# Patient Record
Sex: Female | Born: 1945 | Race: White | Hispanic: No | Smoking: Former smoker
Health system: Southern US, Community
[De-identification: ages and names within clinical notes are randomized; demographics above are authoritative.]

## PROBLEM LIST (undated history)

## (undated) DIAGNOSIS — G5600 Carpal tunnel syndrome, unspecified upper limb: Secondary | ICD-10-CM

## (undated) DIAGNOSIS — I10 Essential (primary) hypertension: Secondary | ICD-10-CM

## (undated) DIAGNOSIS — J9610 Chronic respiratory failure, unspecified whether with hypoxia or hypercapnia: Secondary | ICD-10-CM

## (undated) DIAGNOSIS — F411 Generalized anxiety disorder: Secondary | ICD-10-CM

## (undated) DIAGNOSIS — M545 Low back pain: Secondary | ICD-10-CM

## (undated) DIAGNOSIS — J449 Chronic obstructive pulmonary disease, unspecified: Secondary | ICD-10-CM

## (undated) DIAGNOSIS — I255 Ischemic cardiomyopathy: Secondary | ICD-10-CM

## (undated) DIAGNOSIS — M79609 Pain in unspecified limb: Secondary | ICD-10-CM

## (undated) DIAGNOSIS — R079 Chest pain, unspecified: Secondary | ICD-10-CM

## (undated) DIAGNOSIS — D649 Anemia, unspecified: Secondary | ICD-10-CM

## (undated) DIAGNOSIS — E119 Type 2 diabetes mellitus without complications: Secondary | ICD-10-CM

## (undated) DIAGNOSIS — G2581 Restless legs syndrome: Secondary | ICD-10-CM

## (undated) DIAGNOSIS — N259 Disorder resulting from impaired renal tubular function, unspecified: Secondary | ICD-10-CM

## (undated) DIAGNOSIS — R609 Edema, unspecified: Secondary | ICD-10-CM

## (undated) DIAGNOSIS — I4891 Unspecified atrial fibrillation: Secondary | ICD-10-CM

## (undated) DIAGNOSIS — E7800 Pure hypercholesterolemia, unspecified: Secondary | ICD-10-CM

## (undated) DIAGNOSIS — I5021 Acute systolic (congestive) heart failure: Secondary | ICD-10-CM

## (undated) DIAGNOSIS — J189 Pneumonia, unspecified organism: Secondary | ICD-10-CM

## (undated) HISTORY — DX: Chronic obstructive pulmonary disease, unspecified: J44.9

## (undated) HISTORY — DX: Pneumonia, unspecified organism: J18.9

## (undated) HISTORY — DX: Disorder resulting from impaired renal tubular function, unspecified: N25.9

## (undated) HISTORY — DX: Chronic respiratory failure, unspecified whether with hypoxia or hypercapnia: J96.10

## (undated) HISTORY — DX: Morbid (severe) obesity due to excess calories: E66.01

## (undated) HISTORY — DX: Pure hypercholesterolemia, unspecified: E78.00

## (undated) HISTORY — DX: Edema, unspecified: R60.9

## (undated) HISTORY — DX: Type 2 diabetes mellitus without complications: E11.9

## (undated) HISTORY — DX: Anemia, unspecified: D64.9

## (undated) HISTORY — DX: Chest pain, unspecified: R07.9

## (undated) HISTORY — DX: Pain in unspecified limb: M79.609

## (undated) HISTORY — DX: Generalized anxiety disorder: F41.1

## (undated) HISTORY — DX: Restless legs syndrome: G25.81

## (undated) HISTORY — DX: Carpal tunnel syndrome, unspecified upper limb: G56.00

## (undated) HISTORY — DX: Essential (primary) hypertension: I10

## (undated) HISTORY — DX: Low back pain: M54.5

---

## 1985-01-11 HISTORY — PX: ABDOMINAL HYSTERECTOMY: SHX81

## 1997-12-13 ENCOUNTER — Other Ambulatory Visit: Admission: RE | Admit: 1997-12-13 | Discharge: 1997-12-13 | Payer: Self-pay | Admitting: Gynecology

## 1998-05-08 ENCOUNTER — Ambulatory Visit (HOSPITAL_COMMUNITY): Admission: RE | Admit: 1998-05-08 | Discharge: 1998-05-08 | Payer: Self-pay | Admitting: Gynecology

## 1998-12-25 ENCOUNTER — Other Ambulatory Visit: Admission: RE | Admit: 1998-12-25 | Discharge: 1998-12-25 | Payer: Self-pay | Admitting: Gynecology

## 1999-06-06 ENCOUNTER — Inpatient Hospital Stay (HOSPITAL_COMMUNITY): Admission: EM | Admit: 1999-06-06 | Discharge: 1999-06-11 | Payer: Self-pay | Admitting: Emergency Medicine

## 1999-06-07 ENCOUNTER — Encounter: Payer: Self-pay | Admitting: Infectious Diseases

## 1999-06-17 ENCOUNTER — Encounter: Admission: RE | Admit: 1999-06-17 | Discharge: 1999-06-17 | Payer: Self-pay | Admitting: Internal Medicine

## 1999-07-20 ENCOUNTER — Encounter: Admission: RE | Admit: 1999-07-20 | Discharge: 1999-07-20 | Payer: Self-pay | Admitting: Internal Medicine

## 1999-08-05 ENCOUNTER — Encounter: Admission: RE | Admit: 1999-08-05 | Discharge: 1999-11-03 | Payer: Self-pay | Admitting: *Deleted

## 1999-08-17 ENCOUNTER — Encounter: Admission: RE | Admit: 1999-08-17 | Discharge: 1999-08-17 | Payer: Self-pay | Admitting: Internal Medicine

## 1999-08-20 ENCOUNTER — Encounter (HOSPITAL_COMMUNITY): Admission: RE | Admit: 1999-08-20 | Discharge: 1999-11-18 | Payer: Self-pay | Admitting: Internal Medicine

## 1999-08-31 ENCOUNTER — Encounter: Admission: RE | Admit: 1999-08-31 | Discharge: 1999-08-31 | Payer: Self-pay | Admitting: Internal Medicine

## 1999-09-22 ENCOUNTER — Encounter: Admission: RE | Admit: 1999-09-22 | Discharge: 1999-09-22 | Payer: Self-pay | Admitting: Internal Medicine

## 1999-12-14 ENCOUNTER — Encounter: Admission: RE | Admit: 1999-12-14 | Discharge: 1999-12-14 | Payer: Self-pay | Admitting: Internal Medicine

## 2000-04-19 ENCOUNTER — Other Ambulatory Visit: Admission: RE | Admit: 2000-04-19 | Discharge: 2000-04-19 | Payer: Self-pay | Admitting: Obstetrics & Gynecology

## 2001-08-22 ENCOUNTER — Inpatient Hospital Stay (HOSPITAL_COMMUNITY): Admission: EM | Admit: 2001-08-22 | Discharge: 2001-08-25 | Payer: Self-pay | Admitting: Emergency Medicine

## 2001-10-03 ENCOUNTER — Encounter: Admission: RE | Admit: 2001-10-03 | Discharge: 2002-01-01 | Payer: Self-pay | Admitting: Internal Medicine

## 2002-01-18 ENCOUNTER — Encounter: Admission: RE | Admit: 2002-01-18 | Discharge: 2002-04-18 | Payer: Self-pay | Admitting: Internal Medicine

## 2002-10-01 ENCOUNTER — Encounter: Payer: Self-pay | Admitting: Emergency Medicine

## 2002-10-01 ENCOUNTER — Emergency Department (HOSPITAL_COMMUNITY): Admission: EM | Admit: 2002-10-01 | Discharge: 2002-10-01 | Payer: Self-pay | Admitting: Emergency Medicine

## 2003-02-04 ENCOUNTER — Inpatient Hospital Stay (HOSPITAL_COMMUNITY): Admission: EM | Admit: 2003-02-04 | Discharge: 2003-02-07 | Payer: Self-pay | Admitting: Emergency Medicine

## 2003-12-12 ENCOUNTER — Ambulatory Visit: Payer: Self-pay | Admitting: Professional

## 2003-12-16 ENCOUNTER — Ambulatory Visit: Payer: Self-pay | Admitting: Professional

## 2004-01-16 ENCOUNTER — Ambulatory Visit: Payer: Self-pay | Admitting: Professional

## 2004-01-30 ENCOUNTER — Ambulatory Visit: Payer: Self-pay | Admitting: Endocrinology

## 2004-01-30 ENCOUNTER — Ambulatory Visit: Payer: Self-pay | Admitting: Professional

## 2004-02-11 ENCOUNTER — Ambulatory Visit: Payer: Self-pay | Admitting: Endocrinology

## 2004-02-24 ENCOUNTER — Ambulatory Visit: Payer: Self-pay | Admitting: Professional

## 2004-03-02 ENCOUNTER — Ambulatory Visit: Payer: Self-pay | Admitting: Professional

## 2004-03-24 ENCOUNTER — Emergency Department (HOSPITAL_COMMUNITY): Admission: EM | Admit: 2004-03-24 | Discharge: 2004-03-25 | Payer: Self-pay | Admitting: Emergency Medicine

## 2004-05-05 ENCOUNTER — Ambulatory Visit: Payer: Self-pay | Admitting: Endocrinology

## 2004-06-04 ENCOUNTER — Ambulatory Visit: Payer: Self-pay | Admitting: Endocrinology

## 2004-07-02 ENCOUNTER — Ambulatory Visit: Payer: Self-pay | Admitting: Endocrinology

## 2004-07-03 ENCOUNTER — Ambulatory Visit: Payer: Self-pay | Admitting: Internal Medicine

## 2004-07-13 ENCOUNTER — Ambulatory Visit: Payer: Self-pay | Admitting: Professional

## 2004-08-04 ENCOUNTER — Ambulatory Visit: Payer: Self-pay | Admitting: Endocrinology

## 2004-08-04 ENCOUNTER — Ambulatory Visit: Payer: Self-pay | Admitting: Cardiology

## 2004-11-05 ENCOUNTER — Ambulatory Visit: Payer: Self-pay | Admitting: Endocrinology

## 2004-11-24 ENCOUNTER — Emergency Department (HOSPITAL_COMMUNITY): Admission: EM | Admit: 2004-11-24 | Discharge: 2004-11-24 | Payer: Self-pay | Admitting: Emergency Medicine

## 2004-12-07 ENCOUNTER — Ambulatory Visit: Payer: Self-pay | Admitting: Endocrinology

## 2004-12-22 ENCOUNTER — Ambulatory Visit: Payer: Self-pay | Admitting: Internal Medicine

## 2005-01-12 ENCOUNTER — Ambulatory Visit: Payer: Self-pay | Admitting: Endocrinology

## 2005-01-13 ENCOUNTER — Ambulatory Visit: Payer: Self-pay | Admitting: Endocrinology

## 2005-02-17 ENCOUNTER — Ambulatory Visit: Payer: Self-pay | Admitting: Endocrinology

## 2005-02-18 ENCOUNTER — Ambulatory Visit: Payer: Self-pay | Admitting: Professional

## 2005-02-25 ENCOUNTER — Ambulatory Visit: Payer: Self-pay | Admitting: Internal Medicine

## 2005-02-25 ENCOUNTER — Ambulatory Visit: Payer: Self-pay | Admitting: Professional

## 2005-03-04 ENCOUNTER — Ambulatory Visit: Payer: Self-pay | Admitting: Professional

## 2005-03-11 ENCOUNTER — Ambulatory Visit: Payer: Self-pay | Admitting: Internal Medicine

## 2005-03-30 ENCOUNTER — Ambulatory Visit: Payer: Self-pay | Admitting: Internal Medicine

## 2005-04-12 ENCOUNTER — Ambulatory Visit: Payer: Self-pay | Admitting: Endocrinology

## 2005-04-14 ENCOUNTER — Ambulatory Visit: Payer: Self-pay | Admitting: Endocrinology

## 2005-04-15 ENCOUNTER — Ambulatory Visit: Payer: Self-pay | Admitting: Endocrinology

## 2005-04-23 ENCOUNTER — Ambulatory Visit: Payer: Self-pay | Admitting: Endocrinology

## 2005-05-10 ENCOUNTER — Ambulatory Visit: Payer: Self-pay | Admitting: Professional

## 2005-05-14 ENCOUNTER — Ambulatory Visit: Payer: Self-pay | Admitting: Internal Medicine

## 2005-05-24 ENCOUNTER — Ambulatory Visit: Payer: Self-pay | Admitting: Endocrinology

## 2005-05-27 ENCOUNTER — Ambulatory Visit: Payer: Self-pay | Admitting: Endocrinology

## 2005-05-27 ENCOUNTER — Ambulatory Visit: Payer: Self-pay | Admitting: Professional

## 2005-06-22 ENCOUNTER — Ambulatory Visit: Payer: Self-pay | Admitting: Endocrinology

## 2005-07-05 ENCOUNTER — Ambulatory Visit: Payer: Self-pay | Admitting: Professional

## 2005-07-06 ENCOUNTER — Ambulatory Visit: Payer: Self-pay | Admitting: Internal Medicine

## 2005-07-20 ENCOUNTER — Emergency Department (HOSPITAL_COMMUNITY): Admission: EM | Admit: 2005-07-20 | Discharge: 2005-07-20 | Payer: Self-pay | Admitting: Emergency Medicine

## 2005-07-22 ENCOUNTER — Inpatient Hospital Stay (HOSPITAL_COMMUNITY): Admission: EM | Admit: 2005-07-22 | Discharge: 2005-08-04 | Payer: Self-pay | Admitting: Emergency Medicine

## 2005-07-22 ENCOUNTER — Ambulatory Visit: Payer: Self-pay | Admitting: Internal Medicine

## 2005-07-23 ENCOUNTER — Ambulatory Visit: Payer: Self-pay | Admitting: Internal Medicine

## 2005-07-23 ENCOUNTER — Encounter: Payer: Self-pay | Admitting: Internal Medicine

## 2005-08-20 ENCOUNTER — Ambulatory Visit: Payer: Self-pay | Admitting: Internal Medicine

## 2005-09-02 ENCOUNTER — Ambulatory Visit: Payer: Self-pay | Admitting: Internal Medicine

## 2005-09-08 ENCOUNTER — Ambulatory Visit: Payer: Self-pay | Admitting: Endocrinology

## 2005-10-11 ENCOUNTER — Ambulatory Visit: Payer: Self-pay | Admitting: Endocrinology

## 2005-10-15 ENCOUNTER — Ambulatory Visit: Payer: Self-pay | Admitting: Internal Medicine

## 2005-10-18 ENCOUNTER — Ambulatory Visit: Payer: Self-pay | Admitting: Family Medicine

## 2005-10-29 ENCOUNTER — Ambulatory Visit: Payer: Self-pay | Admitting: Internal Medicine

## 2005-11-22 ENCOUNTER — Ambulatory Visit: Payer: Self-pay | Admitting: Professional

## 2005-12-10 ENCOUNTER — Ambulatory Visit: Payer: Self-pay | Admitting: Internal Medicine

## 2005-12-15 ENCOUNTER — Encounter: Admission: RE | Admit: 2005-12-15 | Discharge: 2005-12-15 | Payer: Self-pay | Admitting: Sports Medicine

## 2005-12-30 ENCOUNTER — Ambulatory Visit: Payer: Self-pay | Admitting: Pulmonary Disease

## 2006-01-21 ENCOUNTER — Ambulatory Visit: Payer: Self-pay | Admitting: Internal Medicine

## 2006-02-17 ENCOUNTER — Ambulatory Visit: Payer: Self-pay | Admitting: Endocrinology

## 2006-02-17 ENCOUNTER — Ambulatory Visit: Payer: Self-pay | Admitting: Internal Medicine

## 2006-02-21 ENCOUNTER — Ambulatory Visit: Payer: Self-pay | Admitting: Professional

## 2006-05-09 ENCOUNTER — Ambulatory Visit: Payer: Self-pay | Admitting: Professional

## 2006-05-10 ENCOUNTER — Ambulatory Visit: Payer: Self-pay | Admitting: Internal Medicine

## 2006-05-30 ENCOUNTER — Ambulatory Visit: Payer: Self-pay | Admitting: Endocrinology

## 2006-05-31 LAB — CONVERTED CEMR LAB
BUN: 16 mg/dL (ref 6–23)
Chloride: 101 meq/L (ref 96–112)
Cholesterol: 146 mg/dL (ref 0–200)
Direct LDL: 66.5 mg/dL
GFR calc non Af Amer: 49 mL/min
Hgb A1c MFr Bld: 8.9 % — ABNORMAL HIGH (ref 4.6–6.0)
Sodium: 136 meq/L (ref 135–145)
Total CHOL/HDL Ratio: 3.9
VLDL: 52 mg/dL — ABNORMAL HIGH (ref 0–40)

## 2006-06-16 ENCOUNTER — Inpatient Hospital Stay (HOSPITAL_COMMUNITY): Admission: EM | Admit: 2006-06-16 | Discharge: 2006-06-20 | Payer: Self-pay | Admitting: Internal Medicine

## 2006-06-18 ENCOUNTER — Ambulatory Visit: Payer: Self-pay | Admitting: Internal Medicine

## 2006-06-22 ENCOUNTER — Ambulatory Visit: Payer: Self-pay | Admitting: Endocrinology

## 2006-08-16 ENCOUNTER — Ambulatory Visit: Payer: Self-pay | Admitting: Endocrinology

## 2006-08-16 LAB — CONVERTED CEMR LAB
ALT: 18 units/L (ref 0–35)
AST: 16 units/L (ref 0–37)
Albumin: 3.6 g/dL (ref 3.5–5.2)
Alkaline Phosphatase: 65 units/L (ref 39–117)
Basophils Absolute: 0 10*3/uL (ref 0.0–0.1)
Calcium: 9.7 mg/dL (ref 8.4–10.5)
Chloride: 94 meq/L — ABNORMAL LOW (ref 96–112)
Eosinophils Absolute: 0.2 10*3/uL (ref 0.0–0.6)
Eosinophils Relative: 1.1 % (ref 0.0–5.0)
GFR calc non Af Amer: 49 mL/min
MCHC: 33.7 g/dL (ref 30.0–36.0)
MCV: 88.4 fL (ref 78.0–100.0)
Platelets: 263 10*3/uL (ref 150–400)
RBC: 4.68 M/uL (ref 3.87–5.11)
WBC: 15.1 10*3/uL — ABNORMAL HIGH (ref 4.5–10.5)

## 2006-08-23 ENCOUNTER — Ambulatory Visit: Payer: Self-pay | Admitting: Cardiology

## 2006-08-24 ENCOUNTER — Ambulatory Visit: Payer: Self-pay | Admitting: Endocrinology

## 2006-09-01 ENCOUNTER — Ambulatory Visit: Payer: Self-pay | Admitting: Professional

## 2006-09-22 ENCOUNTER — Ambulatory Visit: Payer: Self-pay | Admitting: Internal Medicine

## 2006-11-03 ENCOUNTER — Ambulatory Visit: Payer: Self-pay | Admitting: Endocrinology

## 2006-11-08 DIAGNOSIS — F411 Generalized anxiety disorder: Secondary | ICD-10-CM | POA: Insufficient documentation

## 2006-11-08 DIAGNOSIS — J449 Chronic obstructive pulmonary disease, unspecified: Secondary | ICD-10-CM

## 2006-11-08 DIAGNOSIS — I10 Essential (primary) hypertension: Secondary | ICD-10-CM | POA: Insufficient documentation

## 2006-11-08 DIAGNOSIS — E119 Type 2 diabetes mellitus without complications: Secondary | ICD-10-CM

## 2006-11-08 DIAGNOSIS — J4489 Other specified chronic obstructive pulmonary disease: Secondary | ICD-10-CM

## 2006-11-08 HISTORY — DX: Essential (primary) hypertension: I10

## 2006-11-08 HISTORY — DX: Chronic obstructive pulmonary disease, unspecified: J44.9

## 2006-11-08 HISTORY — DX: Generalized anxiety disorder: F41.1

## 2006-11-08 HISTORY — DX: Type 2 diabetes mellitus without complications: E11.9

## 2006-11-08 HISTORY — DX: Other specified chronic obstructive pulmonary disease: J44.89

## 2006-11-15 ENCOUNTER — Encounter: Payer: Self-pay | Admitting: Endocrinology

## 2006-11-23 ENCOUNTER — Telehealth: Payer: Self-pay | Admitting: Endocrinology

## 2006-12-02 ENCOUNTER — Ambulatory Visit: Payer: Self-pay

## 2006-12-02 ENCOUNTER — Encounter: Payer: Self-pay | Admitting: Endocrinology

## 2007-01-10 ENCOUNTER — Telehealth (INDEPENDENT_AMBULATORY_CARE_PROVIDER_SITE_OTHER): Payer: Self-pay | Admitting: *Deleted

## 2007-02-13 ENCOUNTER — Ambulatory Visit: Payer: Self-pay | Admitting: Gastroenterology

## 2007-02-15 ENCOUNTER — Encounter: Payer: Self-pay | Admitting: Internal Medicine

## 2007-02-23 ENCOUNTER — Telehealth (INDEPENDENT_AMBULATORY_CARE_PROVIDER_SITE_OTHER): Payer: Self-pay | Admitting: *Deleted

## 2007-02-28 ENCOUNTER — Ambulatory Visit: Payer: Self-pay | Admitting: Endocrinology

## 2007-02-28 LAB — CONVERTED CEMR LAB
Ketones, urine, test strip: NEGATIVE
Nitrite: NEGATIVE
Urobilinogen, UA: 0.2

## 2007-03-06 ENCOUNTER — Telehealth (INDEPENDENT_AMBULATORY_CARE_PROVIDER_SITE_OTHER): Payer: Self-pay | Admitting: *Deleted

## 2007-03-28 ENCOUNTER — Ambulatory Visit: Payer: Self-pay | Admitting: Endocrinology

## 2007-03-31 ENCOUNTER — Encounter: Admission: RE | Admit: 2007-03-31 | Discharge: 2007-03-31 | Payer: Self-pay | Admitting: Endocrinology

## 2007-04-11 ENCOUNTER — Ambulatory Visit: Payer: Self-pay | Admitting: Endocrinology

## 2007-04-17 ENCOUNTER — Ambulatory Visit: Payer: Self-pay | Admitting: Internal Medicine

## 2007-04-17 ENCOUNTER — Inpatient Hospital Stay (HOSPITAL_COMMUNITY): Admission: EM | Admit: 2007-04-17 | Discharge: 2007-04-21 | Payer: Self-pay | Admitting: Emergency Medicine

## 2007-04-17 ENCOUNTER — Ambulatory Visit: Payer: Self-pay | Admitting: Pulmonary Disease

## 2007-04-20 ENCOUNTER — Encounter: Payer: Self-pay | Admitting: Internal Medicine

## 2007-05-10 ENCOUNTER — Encounter: Payer: Self-pay | Admitting: Internal Medicine

## 2007-05-11 ENCOUNTER — Encounter (INDEPENDENT_AMBULATORY_CARE_PROVIDER_SITE_OTHER): Payer: Self-pay | Admitting: *Deleted

## 2007-05-11 ENCOUNTER — Ambulatory Visit: Payer: Self-pay | Admitting: Endocrinology

## 2007-05-11 DIAGNOSIS — R930 Abnormal findings on diagnostic imaging of skull and head, not elsewhere classified: Secondary | ICD-10-CM | POA: Insufficient documentation

## 2007-06-01 ENCOUNTER — Encounter (INDEPENDENT_AMBULATORY_CARE_PROVIDER_SITE_OTHER): Payer: Self-pay | Admitting: *Deleted

## 2007-06-06 ENCOUNTER — Encounter: Payer: Self-pay | Admitting: Endocrinology

## 2007-06-09 ENCOUNTER — Encounter: Payer: Self-pay | Admitting: Endocrinology

## 2007-06-23 ENCOUNTER — Encounter: Payer: Self-pay | Admitting: Endocrinology

## 2007-06-27 ENCOUNTER — Encounter: Payer: Self-pay | Admitting: Endocrinology

## 2007-06-28 ENCOUNTER — Telehealth: Payer: Self-pay | Admitting: Endocrinology

## 2007-06-29 ENCOUNTER — Telehealth: Payer: Self-pay | Admitting: Endocrinology

## 2007-07-04 ENCOUNTER — Ambulatory Visit: Payer: Self-pay | Admitting: Endocrinology

## 2007-07-04 LAB — CONVERTED CEMR LAB
CO2: 32 meq/L (ref 19–32)
Calcium: 9.5 mg/dL (ref 8.4–10.5)
Creatinine, Ser: 1 mg/dL (ref 0.4–1.2)
Creatinine,U: 46.3 mg/dL
HDL: 30.4 mg/dL — ABNORMAL LOW (ref >39.0)
LDL Cholesterol: 78 mg/dL (ref 0–99)
TSH: 1.22 microintl units/mL (ref 0.35–5.50)
Total CHOL/HDL Ratio: 4.7
Triglycerides: 180 mg/dL — ABNORMAL HIGH (ref 0–149)

## 2007-07-18 ENCOUNTER — Encounter: Payer: Self-pay | Admitting: Endocrinology

## 2007-07-24 ENCOUNTER — Telehealth: Payer: Self-pay | Admitting: Endocrinology

## 2007-07-24 ENCOUNTER — Encounter: Payer: Self-pay | Admitting: Internal Medicine

## 2007-07-31 ENCOUNTER — Encounter: Payer: Self-pay | Admitting: Endocrinology

## 2007-08-15 ENCOUNTER — Telehealth (INDEPENDENT_AMBULATORY_CARE_PROVIDER_SITE_OTHER): Payer: Self-pay | Admitting: *Deleted

## 2007-08-16 ENCOUNTER — Telehealth (INDEPENDENT_AMBULATORY_CARE_PROVIDER_SITE_OTHER): Payer: Self-pay | Admitting: *Deleted

## 2007-08-24 ENCOUNTER — Telehealth (INDEPENDENT_AMBULATORY_CARE_PROVIDER_SITE_OTHER): Payer: Self-pay | Admitting: *Deleted

## 2007-09-04 ENCOUNTER — Ambulatory Visit: Payer: Self-pay | Admitting: Endocrinology

## 2007-09-04 DIAGNOSIS — M79609 Pain in unspecified limb: Secondary | ICD-10-CM

## 2007-09-04 HISTORY — DX: Pain in unspecified limb: M79.609

## 2007-09-04 LAB — CONVERTED CEMR LAB: Sed Rate: 51 mm/hr — ABNORMAL HIGH (ref 0–22)

## 2007-09-05 ENCOUNTER — Telehealth: Payer: Self-pay | Admitting: Endocrinology

## 2007-10-06 ENCOUNTER — Telehealth: Payer: Self-pay | Admitting: Internal Medicine

## 2007-10-30 ENCOUNTER — Ambulatory Visit: Payer: Self-pay | Admitting: Endocrinology

## 2007-10-30 DIAGNOSIS — G5600 Carpal tunnel syndrome, unspecified upper limb: Secondary | ICD-10-CM | POA: Insufficient documentation

## 2007-10-30 HISTORY — DX: Carpal tunnel syndrome, unspecified upper limb: G56.00

## 2007-11-06 ENCOUNTER — Telehealth (INDEPENDENT_AMBULATORY_CARE_PROVIDER_SITE_OTHER): Payer: Self-pay | Admitting: *Deleted

## 2007-11-07 ENCOUNTER — Inpatient Hospital Stay (HOSPITAL_COMMUNITY): Admission: EM | Admit: 2007-11-07 | Discharge: 2007-11-10 | Payer: Self-pay | Admitting: Emergency Medicine

## 2007-11-07 ENCOUNTER — Ambulatory Visit: Payer: Self-pay | Admitting: Internal Medicine

## 2007-11-17 ENCOUNTER — Telehealth: Payer: Self-pay | Admitting: Endocrinology

## 2007-11-19 ENCOUNTER — Encounter: Payer: Self-pay | Admitting: Endocrinology

## 2007-11-23 ENCOUNTER — Ambulatory Visit: Payer: Self-pay | Admitting: Endocrinology

## 2007-12-01 ENCOUNTER — Ambulatory Visit: Payer: Self-pay | Admitting: Endocrinology

## 2007-12-01 ENCOUNTER — Encounter: Payer: Self-pay | Admitting: Endocrinology

## 2007-12-01 DIAGNOSIS — J189 Pneumonia, unspecified organism: Secondary | ICD-10-CM | POA: Insufficient documentation

## 2007-12-01 HISTORY — DX: Pneumonia, unspecified organism: J18.9

## 2007-12-22 ENCOUNTER — Telehealth (INDEPENDENT_AMBULATORY_CARE_PROVIDER_SITE_OTHER): Payer: Self-pay | Admitting: *Deleted

## 2008-01-09 ENCOUNTER — Telehealth: Payer: Self-pay | Admitting: Endocrinology

## 2008-02-29 ENCOUNTER — Ambulatory Visit: Payer: Self-pay | Admitting: Endocrinology

## 2008-02-29 DIAGNOSIS — R059 Cough, unspecified: Secondary | ICD-10-CM | POA: Insufficient documentation

## 2008-02-29 DIAGNOSIS — R05 Cough: Secondary | ICD-10-CM

## 2008-03-08 ENCOUNTER — Telehealth (INDEPENDENT_AMBULATORY_CARE_PROVIDER_SITE_OTHER): Payer: Self-pay | Admitting: *Deleted

## 2008-03-08 ENCOUNTER — Ambulatory Visit: Payer: Self-pay | Admitting: Endocrinology

## 2008-03-10 ENCOUNTER — Encounter: Payer: Self-pay | Admitting: Endocrinology

## 2008-04-10 ENCOUNTER — Telehealth (INDEPENDENT_AMBULATORY_CARE_PROVIDER_SITE_OTHER): Payer: Self-pay | Admitting: *Deleted

## 2008-06-18 ENCOUNTER — Ambulatory Visit: Payer: Self-pay | Admitting: Endocrinology

## 2008-06-18 DIAGNOSIS — R609 Edema, unspecified: Secondary | ICD-10-CM

## 2008-06-18 HISTORY — DX: Edema, unspecified: R60.9

## 2008-06-18 LAB — CONVERTED CEMR LAB
ALT: 19 U/L
AST: 21 U/L
Albumin: 3.8 g/dL
Alkaline Phosphatase: 57 U/L
BUN: 25 mg/dL — ABNORMAL HIGH
Bilirubin, Direct: 0.1 mg/dL
CO2: 32 meq/L
Calcium: 9.5 mg/dL
Chloride: 103 meq/L
Cholesterol: 146 mg/dL
Creatinine, Ser: 1.3 mg/dL — ABNORMAL HIGH
Direct LDL: 69.9 mg/dL
GFR calc non Af Amer: 43.95 mL/min
Glucose, Bld: 303 mg/dL — ABNORMAL HIGH
HDL: 39.4 mg/dL
Hgb A1c MFr Bld: 10.8 % — ABNORMAL HIGH
Potassium: 5 meq/L
Pro B Natriuretic peptide (BNP): 43 pg/mL
Sodium: 142 meq/L
TSH: 1.66 u[IU]/mL
Total Bilirubin: 0.5 mg/dL
Total CHOL/HDL Ratio: 4
Total Protein: 7.8 g/dL
Triglycerides: 278 mg/dL — ABNORMAL HIGH
VLDL: 55.6 mg/dL — ABNORMAL HIGH

## 2008-06-20 ENCOUNTER — Telehealth (INDEPENDENT_AMBULATORY_CARE_PROVIDER_SITE_OTHER): Payer: Self-pay | Admitting: *Deleted

## 2008-06-20 ENCOUNTER — Telehealth: Payer: Self-pay | Admitting: Internal Medicine

## 2008-06-27 ENCOUNTER — Telehealth: Payer: Self-pay | Admitting: Endocrinology

## 2008-07-05 ENCOUNTER — Ambulatory Visit: Payer: Self-pay | Admitting: Cardiology

## 2008-07-26 ENCOUNTER — Ambulatory Visit: Payer: Self-pay | Admitting: Endocrinology

## 2008-08-19 ENCOUNTER — Encounter: Payer: Self-pay | Admitting: Endocrinology

## 2008-08-23 ENCOUNTER — Encounter: Payer: Self-pay | Admitting: Endocrinology

## 2008-09-19 ENCOUNTER — Telehealth: Payer: Self-pay | Admitting: Endocrinology

## 2008-09-23 ENCOUNTER — Ambulatory Visit: Payer: Self-pay | Admitting: Endocrinology

## 2008-09-23 ENCOUNTER — Encounter (INDEPENDENT_AMBULATORY_CARE_PROVIDER_SITE_OTHER): Payer: Self-pay | Admitting: *Deleted

## 2008-09-24 ENCOUNTER — Telehealth: Payer: Self-pay | Admitting: Endocrinology

## 2008-10-15 ENCOUNTER — Ambulatory Visit: Payer: Self-pay | Admitting: Endocrinology

## 2008-10-15 DIAGNOSIS — N259 Disorder resulting from impaired renal tubular function, unspecified: Secondary | ICD-10-CM | POA: Insufficient documentation

## 2008-10-15 HISTORY — DX: Disorder resulting from impaired renal tubular function, unspecified: N25.9

## 2008-10-31 ENCOUNTER — Ambulatory Visit: Payer: Self-pay | Admitting: Endocrinology

## 2008-11-11 ENCOUNTER — Ambulatory Visit: Payer: Self-pay | Admitting: Endocrinology

## 2008-11-11 LAB — CONVERTED CEMR LAB
Calcium: 9 mg/dL (ref 8.4–10.5)
GFR calc non Af Amer: 53.23 mL/min (ref >60)
Hgb A1c MFr Bld: 10.5 % — ABNORMAL HIGH (ref 4.6–6.5)
Sodium: 142 meq/L (ref 135–145)

## 2008-12-10 ENCOUNTER — Telehealth: Payer: Self-pay | Admitting: Endocrinology

## 2009-02-13 ENCOUNTER — Inpatient Hospital Stay (HOSPITAL_COMMUNITY): Admission: EM | Admit: 2009-02-13 | Discharge: 2009-02-25 | Payer: Self-pay | Admitting: Emergency Medicine

## 2009-02-13 ENCOUNTER — Ambulatory Visit: Payer: Self-pay | Admitting: Pulmonary Disease

## 2009-02-28 ENCOUNTER — Telehealth (INDEPENDENT_AMBULATORY_CARE_PROVIDER_SITE_OTHER): Payer: Self-pay | Admitting: *Deleted

## 2009-02-28 ENCOUNTER — Telehealth: Payer: Self-pay | Admitting: Endocrinology

## 2009-02-28 ENCOUNTER — Telehealth: Payer: Self-pay | Admitting: Internal Medicine

## 2009-03-05 ENCOUNTER — Telehealth: Payer: Self-pay | Admitting: Endocrinology

## 2009-03-06 ENCOUNTER — Telehealth (INDEPENDENT_AMBULATORY_CARE_PROVIDER_SITE_OTHER): Payer: Self-pay | Admitting: *Deleted

## 2009-03-07 ENCOUNTER — Ambulatory Visit: Payer: Self-pay | Admitting: Endocrinology

## 2009-03-07 ENCOUNTER — Ambulatory Visit: Payer: Self-pay | Admitting: Internal Medicine

## 2009-03-07 DIAGNOSIS — M545 Low back pain, unspecified: Secondary | ICD-10-CM

## 2009-03-07 HISTORY — DX: Low back pain, unspecified: M54.50

## 2009-03-10 LAB — CONVERTED CEMR LAB
Leukocytes, UA: NEGATIVE
Specific Gravity, Urine: 1.01 (ref 1.000–1.030)
Urobilinogen, UA: 0.2 (ref 0.0–1.0)

## 2009-03-12 ENCOUNTER — Telehealth: Payer: Self-pay | Admitting: Endocrinology

## 2009-03-18 ENCOUNTER — Telehealth: Payer: Self-pay | Admitting: Endocrinology

## 2009-03-21 ENCOUNTER — Encounter: Payer: Self-pay | Admitting: Endocrinology

## 2009-03-24 ENCOUNTER — Telehealth (INDEPENDENT_AMBULATORY_CARE_PROVIDER_SITE_OTHER): Payer: Self-pay | Admitting: *Deleted

## 2009-03-24 ENCOUNTER — Encounter: Payer: Self-pay | Admitting: Emergency Medicine

## 2009-04-07 ENCOUNTER — Encounter: Payer: Self-pay | Admitting: Endocrinology

## 2009-04-10 ENCOUNTER — Ambulatory Visit: Payer: Self-pay | Admitting: Endocrinology

## 2009-04-10 IMAGING — CR DG CHEST 2V
2 series · 2 of 2 positions shown · non-contrast
Comparison: 11/07/2007

CLINICAL DATA: Follow up pneumonia

CHEST - 2 VIEW

[view not recorded (1 of 2)]
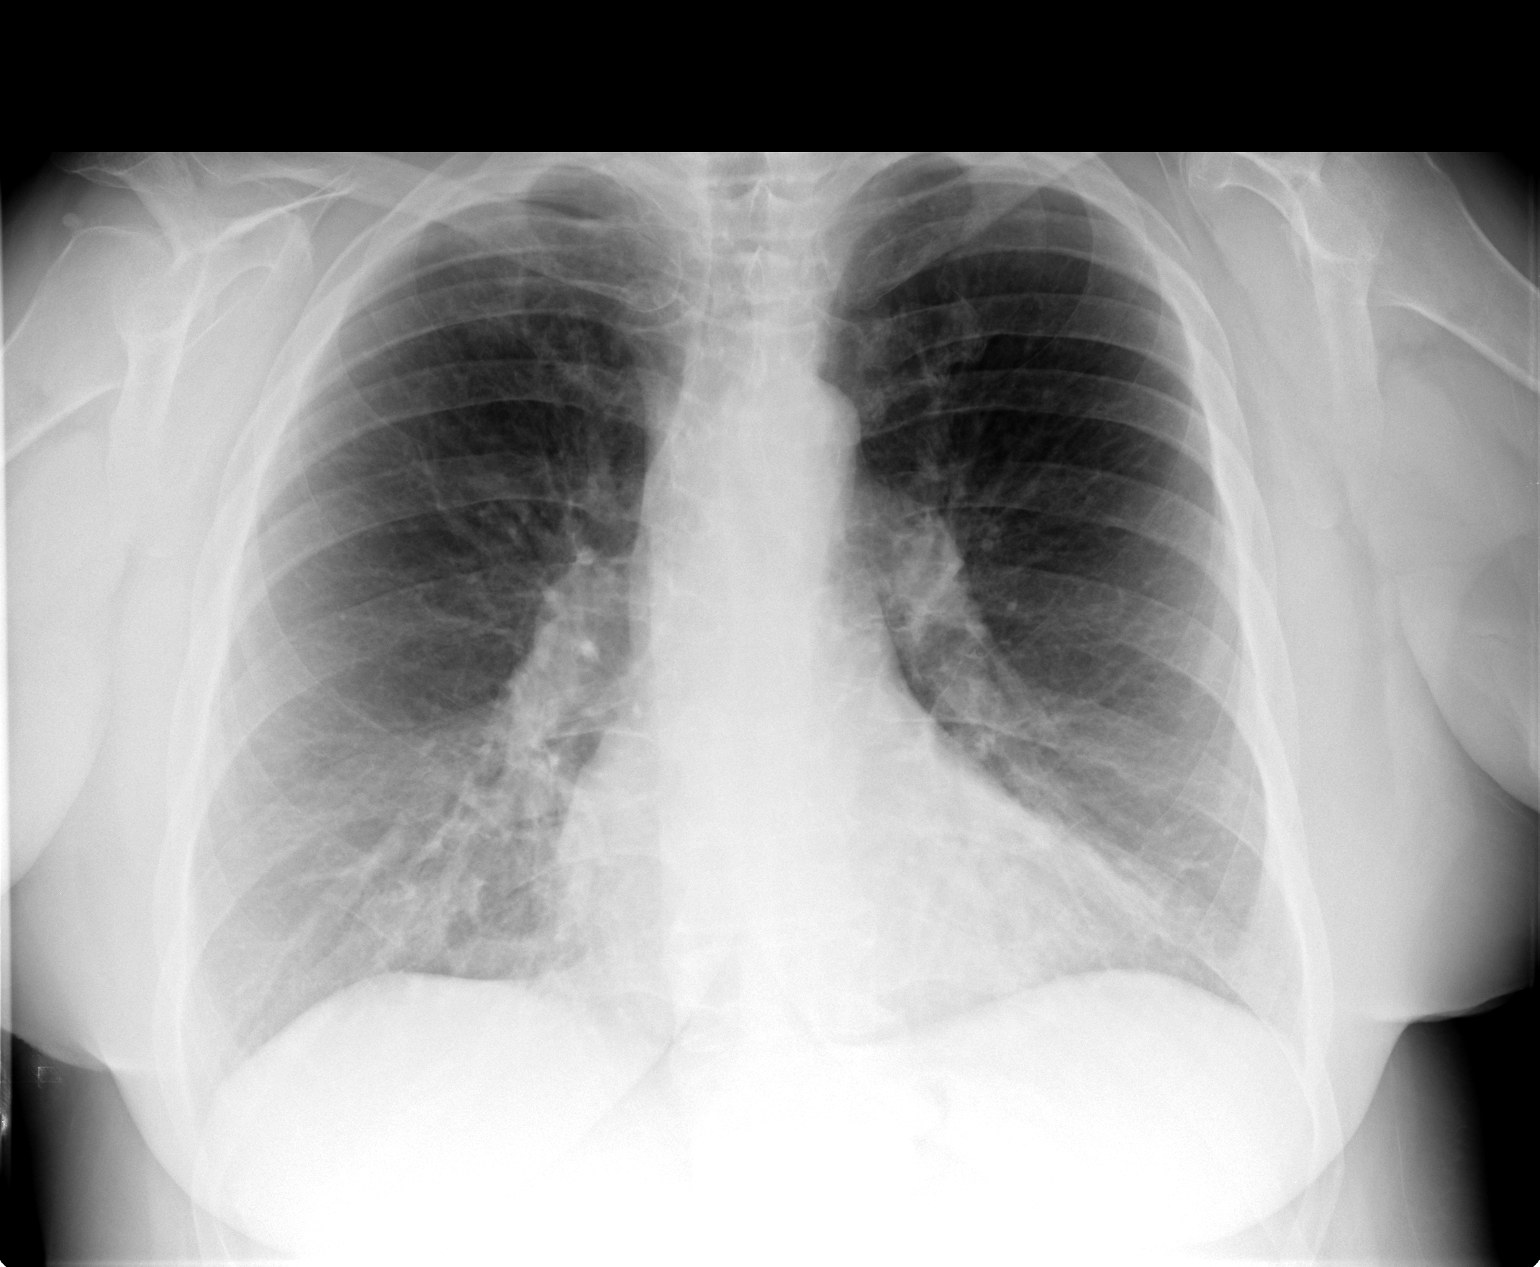

[view not recorded (2 of 2)]
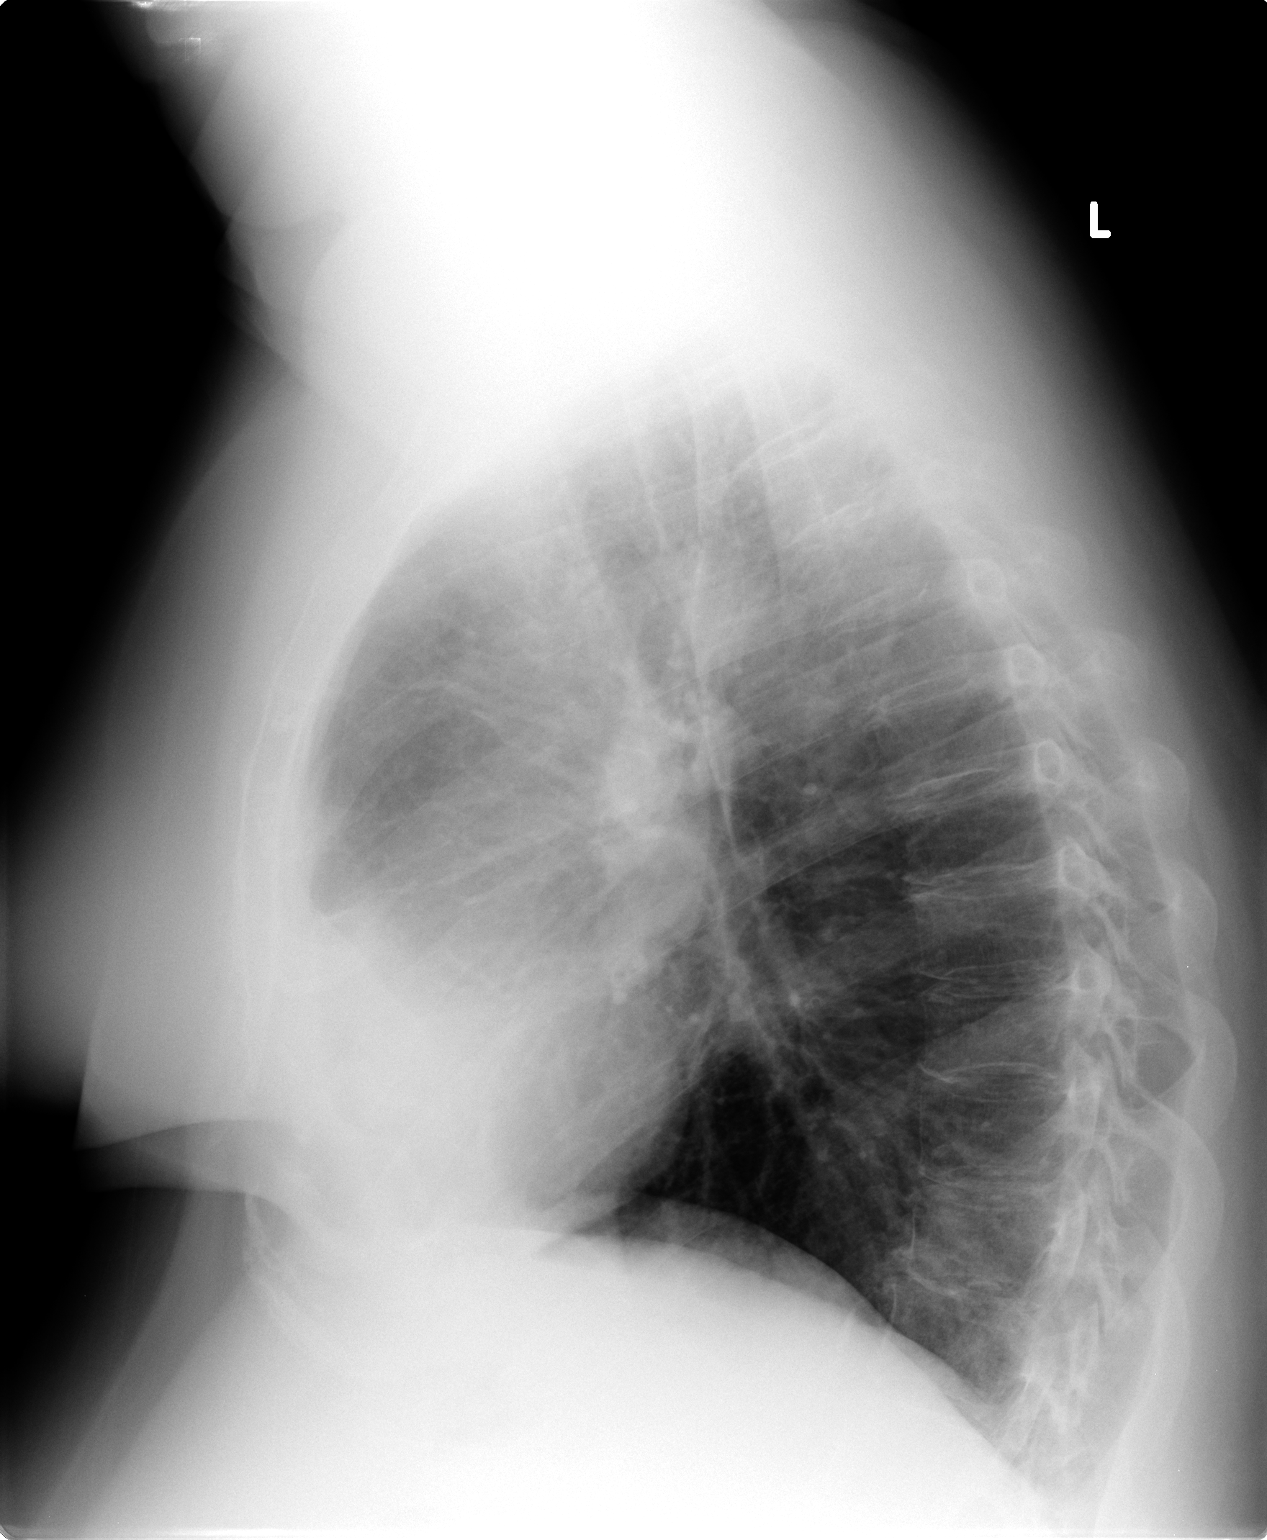

[2 of 2 positions shown; findings below may reference images not displayed]

FINDINGS: Cardiomediastinal silhouette is stable.  No acute
infiltrate or edema. There is improvement in aeration right base
medially with mild residual stranding/scarring. Mild thoracic spine
osteopenia is stable.  The irregular density right upper lobe again
noted measures about 1 cm.  This is stable in size and appearance
from prior exam.
IMPRESSION: No acute infiltrate or edema.  Irregular density in the right upper
lobe stable in size and appearance from prior exam.

## 2009-04-17 ENCOUNTER — Ambulatory Visit: Payer: Self-pay | Admitting: Endocrinology

## 2009-04-21 ENCOUNTER — Ambulatory Visit: Payer: Self-pay | Admitting: Endocrinology

## 2009-05-05 ENCOUNTER — Ambulatory Visit: Payer: Self-pay | Admitting: Endocrinology

## 2009-05-08 ENCOUNTER — Encounter: Payer: Self-pay | Admitting: Emergency Medicine

## 2009-05-12 ENCOUNTER — Encounter: Payer: Self-pay | Admitting: Endocrinology

## 2009-05-14 ENCOUNTER — Encounter: Payer: Self-pay | Admitting: Endocrinology

## 2009-05-15 ENCOUNTER — Telehealth: Payer: Self-pay | Admitting: Endocrinology

## 2009-05-16 ENCOUNTER — Ambulatory Visit: Payer: Self-pay | Admitting: Endocrinology

## 2009-05-16 ENCOUNTER — Telehealth: Payer: Self-pay | Admitting: Internal Medicine

## 2009-05-19 ENCOUNTER — Telehealth: Payer: Self-pay | Admitting: Endocrinology

## 2009-05-28 ENCOUNTER — Encounter: Payer: Self-pay | Admitting: Endocrinology

## 2009-05-28 ENCOUNTER — Telehealth (INDEPENDENT_AMBULATORY_CARE_PROVIDER_SITE_OTHER): Payer: Self-pay | Admitting: *Deleted

## 2009-05-29 ENCOUNTER — Ambulatory Visit: Payer: Self-pay | Admitting: Endocrinology

## 2009-06-17 ENCOUNTER — Ambulatory Visit: Payer: Self-pay | Admitting: Endocrinology

## 2009-06-18 ENCOUNTER — Telehealth: Payer: Self-pay | Admitting: Endocrinology

## 2009-07-15 ENCOUNTER — Ambulatory Visit: Payer: Self-pay | Admitting: Endocrinology

## 2009-07-18 ENCOUNTER — Ambulatory Visit: Payer: Self-pay | Admitting: Endocrinology

## 2009-07-21 ENCOUNTER — Telehealth: Payer: Self-pay | Admitting: Endocrinology

## 2009-07-24 ENCOUNTER — Ambulatory Visit: Payer: Self-pay | Admitting: Endocrinology

## 2009-07-24 DIAGNOSIS — R079 Chest pain, unspecified: Secondary | ICD-10-CM | POA: Insufficient documentation

## 2009-07-24 DIAGNOSIS — E7800 Pure hypercholesterolemia, unspecified: Secondary | ICD-10-CM

## 2009-07-24 DIAGNOSIS — D649 Anemia, unspecified: Secondary | ICD-10-CM

## 2009-07-24 HISTORY — DX: Chest pain, unspecified: R07.9

## 2009-07-24 HISTORY — DX: Anemia, unspecified: D64.9

## 2009-07-24 HISTORY — DX: Pure hypercholesterolemia, unspecified: E78.00

## 2009-07-30 ENCOUNTER — Telehealth (INDEPENDENT_AMBULATORY_CARE_PROVIDER_SITE_OTHER): Payer: Self-pay

## 2009-07-31 ENCOUNTER — Encounter: Payer: Self-pay | Admitting: Endocrinology

## 2009-08-06 ENCOUNTER — Encounter: Payer: Self-pay | Admitting: Internal Medicine

## 2009-09-02 ENCOUNTER — Telehealth (INDEPENDENT_AMBULATORY_CARE_PROVIDER_SITE_OTHER): Payer: Self-pay | Admitting: *Deleted

## 2009-09-02 ENCOUNTER — Telehealth: Payer: Self-pay | Admitting: Endocrinology

## 2009-09-05 ENCOUNTER — Telehealth: Payer: Self-pay | Admitting: Internal Medicine

## 2009-09-19 ENCOUNTER — Telehealth: Payer: Self-pay | Admitting: Internal Medicine

## 2009-09-21 ENCOUNTER — Encounter: Payer: Self-pay | Admitting: Internal Medicine

## 2009-09-23 ENCOUNTER — Telehealth: Payer: Self-pay | Admitting: Endocrinology

## 2009-10-14 ENCOUNTER — Ambulatory Visit: Payer: Self-pay | Admitting: Endocrinology

## 2009-10-14 LAB — CONVERTED CEMR LAB
BUN: 23 mg/dL (ref 6–23)
Basophils Absolute: 0.1 10*3/uL (ref 0.0–0.1)
Chloride: 99 meq/L (ref 96–112)
Creatinine, Ser: 1.4 mg/dL — ABNORMAL HIGH (ref 0.4–1.2)
Eosinophils Relative: 2 % (ref 0.0–5.0)
Folate: 17.9 ng/mL
GFR calc non Af Amer: 40.85 mL/min (ref >60)
Glucose, Bld: 173 mg/dL — ABNORMAL HIGH (ref 70–99)
Iron: 27 ug/dL — ABNORMAL LOW (ref 42–145)
Monocytes Relative: 4.6 % (ref 3.0–12.0)
Neutrophils Relative %: 73.4 % (ref 43.0–77.0)
Platelets: 322 10*3/uL (ref 150.0–400.0)
Potassium: 5.1 meq/L (ref 3.5–5.1)
RDW: 16.6 % — ABNORMAL HIGH (ref 11.5–14.6)
Transferrin: 311.4 mg/dL (ref 212.0–360.0)
Vitamin B-12: 722 pg/mL (ref 211–911)
WBC: 11.7 10*3/uL — ABNORMAL HIGH (ref 4.5–10.5)

## 2009-10-15 ENCOUNTER — Ambulatory Visit: Payer: Self-pay | Admitting: Internal Medicine

## 2009-10-15 DIAGNOSIS — J9610 Chronic respiratory failure, unspecified whether with hypoxia or hypercapnia: Secondary | ICD-10-CM

## 2009-10-15 DIAGNOSIS — J9611 Chronic respiratory failure with hypoxia: Secondary | ICD-10-CM

## 2009-10-15 HISTORY — DX: Chronic respiratory failure, unspecified whether with hypoxia or hypercapnia: J96.10

## 2009-10-21 ENCOUNTER — Encounter: Payer: Self-pay | Admitting: Internal Medicine

## 2009-10-21 ENCOUNTER — Telehealth: Payer: Self-pay | Admitting: Endocrinology

## 2009-10-24 ENCOUNTER — Encounter: Payer: Self-pay | Admitting: Internal Medicine

## 2009-10-30 ENCOUNTER — Telehealth (INDEPENDENT_AMBULATORY_CARE_PROVIDER_SITE_OTHER): Payer: Self-pay | Admitting: *Deleted

## 2009-11-18 ENCOUNTER — Ambulatory Visit: Payer: Self-pay | Admitting: Endocrinology

## 2009-11-18 DIAGNOSIS — Z780 Asymptomatic menopausal state: Secondary | ICD-10-CM | POA: Insufficient documentation

## 2009-12-18 ENCOUNTER — Ambulatory Visit: Payer: Self-pay | Admitting: Internal Medicine

## 2010-02-01 ENCOUNTER — Encounter: Payer: Self-pay | Admitting: Endocrinology

## 2010-02-08 LAB — CONVERTED CEMR LAB
ALT: 12 units/L (ref 0–35)
Albumin: 3.6 g/dL (ref 3.5–5.2)
BUN: 29 mg/dL — ABNORMAL HIGH (ref 6–23)
Bilirubin Urine: NEGATIVE
Calcium, Total (PTH): 9 mg/dL (ref 8.4–10.5)
Chloride: 100 meq/L (ref 96–112)
Direct LDL: 80 mg/dL
Eosinophils Relative: 2.1 % (ref 0.0–5.0)
Glucose, Bld: 198 mg/dL — ABNORMAL HIGH (ref 70–99)
HCT: 34.5 % — ABNORMAL LOW (ref 36.0–46.0)
Hemoglobin: 11.6 g/dL — ABNORMAL LOW (ref 12.0–15.0)
Hgb A1c MFr Bld: 7.9 % — ABNORMAL HIGH (ref 4.6–6.5)
Ketones, ur: NEGATIVE mg/dL
Lymphs Abs: 1.9 10*3/uL (ref 0.7–4.0)
MCV: 88.6 fL (ref 78.0–100.0)
Microalb Creat Ratio: 31 mg/g — ABNORMAL HIGH (ref 0.0–30.0)
Monocytes Absolute: 0.6 10*3/uL (ref 0.1–1.0)
Neutro Abs: 9.8 10*3/uL — ABNORMAL HIGH (ref 1.4–7.7)
PTH: 79.3 pg/mL — ABNORMAL HIGH (ref 14.0–72.0)
Platelets: 282 10*3/uL (ref 150.0–400.0)
Potassium: 4.6 meq/L (ref 3.5–5.1)
RDW: 17.3 % — ABNORMAL HIGH (ref 11.5–14.6)
Specific Gravity, Urine: 1.02 (ref 1.000–1.030)
TSH: 1.13 microintl units/mL (ref 0.35–5.50)
Total Bilirubin: 0.3 mg/dL (ref 0.3–1.2)
Urine Glucose: NEGATIVE mg/dL
WBC: 12.7 10*3/uL — ABNORMAL HIGH (ref 4.5–10.5)
pH: 5.5 (ref 5.0–8.0)

## 2010-02-12 NOTE — Miscellaneous (Signed)
Summary: Plan of Care & Treatment/[facility]  Plan of Care & Treatment/[facility]   Imported By: Sherian Rein 03/26/2009 13:50:55  _____________________________________________________________________  External Attachment:    Type:   Image     Comment:   External Document

## 2010-02-12 NOTE — Progress Notes (Signed)
Summary: Rx req  Phone Note Call from Patient Call back at Home Phone [phone]   Caller: Patient Summary of Call: Pt is requesting a rx for Shower chair. Pls fax to [facility] Initial call taken by: Orlan Leavens RMA,  September 23, 2009 4:44 PM  Follow-up for Phone Call        i did an rx for this on 09/02/09.  i have reprinted today. Follow-up by: Minus Breeding MD,  September 24, 2009 8:57 AM  Additional Follow-up for Phone Call Additional follow up Details #1::        Pt has appt with [facility] 09/25/2009. Will give pt Rx for Shower chair to forward to company of her choice. See phone note 09/02/2009 Additional Follow-up by: Margaret Pyle, CMA,  September 24, 2009 9:06 AM    Prescriptions: SHOWER CHAIR 278.01   496  #1 x 0   Entered and Authorized by:   Minus Breeding MD   Signed by:   Minus Breeding MD on 09/24/2009   Method used:   Print then Give to Patient   RxID:   [id number]

## 2010-02-12 NOTE — Medication Information (Signed)
Summary: Diabetes Supplies/[facility]  Diabetes Supplies/[facility]   Imported By: Sherian Rein 07/28/2009 10:26:28  _____________________________________________________________________  External Attachment:    Type:   Image     Comment:   External Document

## 2010-02-12 NOTE — Progress Notes (Signed)
Summary: PT information  Phone Note Other Incoming   Caller: Marylu Lund PT w/ [facility] [phone] Summary of Call: PT called to inform MD that pt has been complaining of SOB, 102HR, BP 198-76 LT arm and O2 of 92-93%. please advise Initial call taken by: Margaret Pyle, CMA,  March 18, 2009 1:21 PM  Follow-up for Phone Call        go to er now Follow-up by: Minus Breeding MD,  March 18, 2009 1:37 PM  Additional Follow-up for Phone Call Additional follow up Details #1::        PT informed and will instruct pt Additional Follow-up by: Margaret Pyle, CMA,  March 18, 2009 1:51 PM     Appended Document: PT information PT Marylu Lund from [place] called again to report BP 158-60 hr 112 O2 93-95% CBG 240. After pt walked 20 feet HR 134 and O2 90%. Pt is on 5L of O2. PT was advised to inform to again to go to ER or move her appt. PT will inform pt.

## 2010-02-12 NOTE — Progress Notes (Signed)
----   Converted from flag ---- ---- 10/20/2009 8:55 AM, Brenton Grills MA wrote: Hemoccults mailed to pt's address on file ------------------------------

## 2010-02-12 NOTE — Progress Notes (Signed)
Summary: Defer outpt care to Everardo All unless/ until she re-establishes   Phone Note From Other Clinic Call back at [phone]   Caller: Patient Caller: gentivia  beverly Call For: Sherene Sires Summary of Call: need order for home care for 5 weeks Initial call taken by: Rickard Patience,  May 15, 2009 4:10 PM  Follow-up for Phone Call        [facility] Vernie Murders  May 15, 2009 4:14 PM   MW---ok for pt to have home care x 5 weeks?  please advise.  thanks Randell Loop CMA  May 16, 2009 2:34 PM  she is not my pt  any more- I was offering to see her post hospital  to re-establish but she did not keep appt.  Refer back to Dr Everardo All  Follow-up by: Nyoka Cowden MD,  May 16, 2009 3:28 PM  Additional Follow-up for Phone Call Additional follow up Details #1::        ok f/u ov is due Additional Follow-up by: Minus Breeding MD,  May 17, 2009 11:03 AM    Additional Follow-up for Phone Call Additional follow up Details #2::    [facility] informed Follow-up by: Margaret Pyle, CMA,  May 19, 2009 10:36 AM

## 2010-02-12 NOTE — Assessment & Plan Note (Signed)
Summary: FU--D/T--PER MD OK TO SCHED---STC   Vital Signs:  Patient profile:   65 year old female Height:      66 inches Weight:      259 pounds BMI:     41.95 O2 Sat:      91 % on 4 L/min Temp:     98.3 degrees F oral Pulse rate:   108 / minute BP sitting:   140 / 80  (left arm) Cuff size:   large  Vitals Entered By: Alysia Penna (October 14, 2009 3:15 PM)  O2 Flow:  4 L/min CC: pt here for follow up visit. /cp sma   CC:  pt here for follow up visit. /cp sma.  History of Present Illness: the status of at least 3 ongoing medical problems is addressed today: dm:  no cbg record, but states cbg's are 170-300.  it is in general higher later in the day.  pt states she feels well in general, except for fatigue.   anemia:  no brbpr. htn:  she takes meds as rx'ed.  no change in chronic sob anxiety:  it is severe, but unchanged.  Current Medications (verified): 1)  Multivitamins   Tabs (Multiple Vitamin) .... Take 1 Tablet By Mouth Once A Day 2)  Adult Aspirin Low Strength 81 Mg  Tbdp (Aspirin) .... Take 1 Tablet By Mouth Once A Day 3)  Benicar Hct 40-25 Mg Tabs (Olmesartan Medoxomil-Hctz) .... Take 1 Tablet By Mouth Once A Day 4)  Furosemide 40 Mg Tabs (Furosemide) .... Take 1 Tablet By Mouth Once A Day 5)  Citalopram Hydrobromide 40 Mg Tabs (Citalopram Hydrobromide) .... Take 1 Tablet By Mouth Once A Day 6)  Spiriva Handihaler 18 Mcg  Caps (Tiotropium Bromide Monohydrate) .... Inhale Contents of 1 Capsule Once A Day 7)  Oxygen 4 Lpm .... 24/7 8)  Symbicort 160-4.5 Mcg/act Aero (Budesonide-Formoterol Fumarate) .... Inhale 2 Puff Using Inhaler Twice A Day 9)  Relion 70/30 70-30 %  Susp (Insulin Isophane & Regular) .Marland Kitchen.. 110 Units Qam, 90 Units Pm 10)  Proair Hfa 108 (90 Base) Mcg/act  Aers (Albuterol Sulfate) .... Inhale 2 Puffs Every 4 To 6  Hours As Needed 11)  Tylenol Extra Strength 500 Mg Tabs (Acetaminophen) .... As Directed 12)  Alprazolam 0.25 Mg Tabs (Alprazolam) .... Take  1 Tablet By Mouth Two Times A Day As Needed. Not To Exceed 2/day 13)  Mucinex Dm 30-600 Mg Xr12h-Tab (Dextromethorphan-Guaifenesin) .... Take 1-2 Tablets Every 12 Hours As Needed 14)  Relion Insulin Syringe 31g X 5/16" 1 Ml Misc (Insulin Syringe-Needle U-100) .... Bid 15)  Diltiazem Hcl Er Beads 300 Mg Xr24h-Cap (Diltiazem Hcl Er Beads) .Marland Kitchen.. 1 Once Daily 16)  Shower Chair .Marland Kitchen.. 278.01   496  Allergies (verified): 1)  ! Avandia (Rosiglitazone Maleate) 2)  Actos (Pioglitazone Hcl)  Past History:  Past Medical History: Last updated: 03/07/2009 RENAL INSUFFICIENCY (ICD-588.9) EDEMA (ICD-782.3) ENCOUNTER FOR LONG-TERM USE OF OTHER MEDICATIONS (ICD-V58.69) COUGH (ICD-786.2) PNEUMONIA (ICD-486) CARPAL TUNNEL SYNDROME, BILATERAL (ICD-354.0) HAND PAIN, LEFT (ICD-729.5) ROUTINE GENERAL MEDICAL EXAM@HEALTH  CARE FACL (ICD-V70.0) CT, CHEST, ABNORMAL (ICD-793.1) MORBID OBESITY (ICD-278.01) ANXIETY (ICD-300.00) HYPERTENSION (ICD-401.9) DIABETES MELLITUS, TYPE II (ICD-250.00) COPD (ICD-496)  Review of Systems  The patient denies hypoglycemia.         denies hematuria  Physical Exam  General:  obese.   Pulses:  dorsalis pedis intact bilat.  Extremities:  no deformity.  no ulcer on the feet.  feet are of normal color and temp.  no edema toenails are very long. Neurologic:  sensation is intact to touch on the feet. Additional Exam:  Hemoglobin A1C       [H]  7.9 % Hemoglobin           [L]  11.4 g/dL                   16.1-09.6 Hematocrit           [L]  34.7 %     Iron Saturation      [L]  6.2 %         Impression & Recommendations:  Problem # 1:  DIABETES MELLITUS, TYPE II (ICD-250.00) well-controlled  Problem # 2:  ANEMIA (ICD-285.9) needs increased rx  Problem # 3:  ANXIETY (ICD-300.00) Assessment: Unchanged  Problem # 4:  HYPERTENSION (ICD-401.9) with ? of situational component  Other Orders: TLB-CBC Platelet - w/Differential (85025-CBCD) TLB-IBC Pnl  (Iron/FE;Transferrin) (83550-IBC) TLB-B12 + Folate Pnl (82746_82607-B12/FOL) TLB-A1C / Hgb A1C (Glycohemoglobin) (83036-A1C) TLB-BMP (Basic Metabolic Panel-BMET) (80048-METABOL) Est. Patient Level IV (04540)  Patient Instructions: 1)  for your health, it is critically important that you make and keep doctor appointments as advised.  if not, we will be unable to continue to provide medical services to you. 2)  check your blood sugar 2 times a day.  vary the time of day when you check, between before the 3 meals, and at bedtime.  also check if you have symptoms of your blood sugar being too high or too low.  please keep a record of the readings and bring it to your next appointment here.  please call us sooner if you are having low blood sugar episodes. 3)  Please schedule a "medicare wellness" appointment in 1 month. 4)  blood tests are being ordered for you today.  please call [phone] to hear your test results. 5)  (update: i left message on phone-tree:  take fe 2/day.  hemoccults are being sent to you.)  (please send pt hemoccults).

## 2010-02-12 NOTE — Assessment & Plan Note (Signed)
Summary: MEDICARE WELLNESS/[facility]  #   Vital Signs:  Patient profile:   65 year old female Menstrual status:  hysterectomy Height:      66 inches (167.64 cm) Weight:      277.25 pounds (126.02 kg) BMI:     44.91 O2 Sat:      86 % on 4 L/min Temp:     98.6 degrees F (37.00 degrees C) oral Pulse rate:   118 / minute BP sitting:   116 / 62  (left arm) Cuff size:   large  Vitals Entered By: Brenton Grills CMA Duncan Dull) (November 18, 2009 2:24 PM)  O2 Flow:  4 L/min CC: Medicare Wellness/pt is no longer taking Prednisone or Doxycycline Hyclate/pt declined flu shot/aj Is Patient Diabetic? Yes Comments ptis due for colonoscopy and mammogram     Menstrual Status hysterectomy   Primary Anndrea Mihelich:  Dr. Romero Belling  CC:  Medicare Wellness/pt is no longer taking Prednisone or Doxycycline Hyclate/pt declined flu shot/aj.  History of Present Illness: pt is here for medicare welllness visit.  she denies memory loss.  she says she is able to perform activities of daily living without assistance.  she has no limitations to physical activity, except for O2.  depression persists.  no suicidal ideation.   Current Medications (verified): 1)  Multivitamins   Tabs (Multiple Vitamin) .... Take 1 Tablet By Mouth Once A Day 2)  Adult Aspirin Low Strength 81 Mg  Tbdp (Aspirin) .... Take 1 Tablet By Mouth Once A Day 3)  Benicar Hct 40-25 Mg Tabs (Olmesartan Medoxomil-Hctz) .... Take 1 Tablet By Mouth Once A Day 4)  Furosemide 40 Mg Tabs (Furosemide) .... Take 1 Tablet By Mouth Once A Day 5)  Citalopram Hydrobromide 40 Mg Tabs (Citalopram Hydrobromide) .... Take 1 Tablet By Mouth Once A Day 6)  Spiriva Handihaler 18 Mcg  Caps (Tiotropium Bromide Monohydrate) .... Inhale Contents of 1 Capsule Once A Day 7)  Oxygen 4 Lpm .... 24/7 8)  Symbicort 160-4.5 Mcg/act Aero (Budesonide-Formoterol Fumarate) .... Inhale 2 Puff Using Inhaler Twice A Day 9)  Relion 70/30 70-30 %  Susp (Insulin Isophane & Regular) .Marland Kitchen.. 110  Units Qam, 90 Units Pm 10)  Proair Hfa 108 (90 Base) Mcg/act  Aers (Albuterol Sulfate) .... Inhale 2 Puffs Every 4 To 6  Hours As Needed 11)  Tylenol Extra Strength 500 Mg Tabs (Acetaminophen) .... As Directed 12)  Alprazolam 0.25 Mg Tabs (Alprazolam) .... Take 1 Tablet By Mouth Two Times A Day As Needed. Not To Exceed 2/day 13)  Mucinex Dm 30-600 Mg Xr12h-Tab (Dextromethorphan-Guaifenesin) .... Take 1-2 Tablets Every 12 Hours As Needed 14)  Relion Insulin Syringe 31g X 5/16" 1 Ml Misc (Insulin Syringe-Needle U-100) .... As Directed 15)  Diltiazem Hcl Er Beads 300 Mg Xr24h-Cap (Diltiazem Hcl Er Beads) .Marland Kitchen.. 1 Once Daily 16)  Albuterol Sulfate (2.5 Mg/66ml) 0.083%  Nebu (Albuterol Sulfate) .... Four Times Daily or Every 6 Hours As Needed 17)  Prednisone 10 Mg  Tabs (Prednisone) .... Take As Directed 18)  Doxycycline Hyclate 100 Mg Caps (Doxycycline Hyclate) .... One Twice Daily With Glass of Water Before Eat  Allergies (verified): 1)  ! Avandia (Rosiglitazone Maleate) 2)  Actos (Pioglitazone Hcl)  Past History:  Past Medical History: RENAL INSUFFICIENCY (ICD-588.9) EDEMA (ICD-782.3) ENCOUNTER FOR LONG-TERM USE OF OTHER MEDICATIONS (ICD-V58.69) COUGH (ICD-786.2) PNEUMONIA (ICD-486) CARPAL TUNNEL SYNDROME, BILATERAL (ICD-354.0) HAND PAIN, LEFT (ICD-729.5) ROUTINE GENERAL MEDICAL EXAM@HEALTH  CARE FACL (ICD-V70.0) CT, CHEST, ABNORMAL (ICD-793.1) MORBID OBESITY (ICD-278.01)  ANXIETY (ICD-300.00) HYPERTENSION (ICD-401.9) DIABETES MELLITUS, TYPE II (ICD-250.00) COPD (ICD-496)      - HFA   50%   p coaching October 15, 2009   pulm: dr wert psychology:  dr Luiz Blare gyn: dr Aldona Bar  Family History: Reviewed history from 03/07/2009 and no changes required. asthma - PGF heart disease - mother rheumatism - mother, PGF cancer - mother (pancreatic), father (liver) DM - father stroke -  mother  Social History: Reviewed history from 03/07/2009 and no changes required. former smoker quit 2010  x86yrs 2ppd no alcohol divorced.  lives with dtr. 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    Physical Exam  General:  normal appearance.   Eyes:  (sees opthal) Ears:  grossly normal hearing.   Msk:  pt easily and quickly performs "get-up-and-go" from a sitting position  Psych:  remembers 3/3 at 5 minutes.  excellent recall.  can easily read and write a sentence.  alert and oriented x 3.   Impression & Recommendations:  Problem # 1:  ROUTINE GENERAL MEDICAL EXAM@HEALTH  CARE FACL (ICD-V70.0)  Other Orders: T-Bone Densitometry [id number]) Medicare -1st Annual Wellness Visit [phone])   Patient Instructions: 1)  i am happy to change citalopram to venlafaxine (stronger antirepressant).  let me know if you wish to change. 2)  please consider these measures for your health:  minimize alcohol.  do not use tobacco products.  have a colonoscopy at least every 10 years from age 42.  keep firearms safely stored.  always use seat belts.  have working smoke alarms in your home.  see an eye doctor and dentist regularly.  never drive under the influence of alcohol or drugs (including prescription drugs).  those with fair skin should take precautions against the sun. 3)  please let me know what your wishes would be, if artificial life support measures should become necessary.  it is critically important to prevent falling down (keep floor areas well-lit, dry, and free of loose objects). 4)  here is a list of medicare-covered preventive services. 5)  in view of your anemia, it is very important that you have a colonoscopy.  let me know if you want to schedule this.   6)  Please schedule a follow-up appointment in 3 months.   Orders Added: 1)  T-Bone Densitometry [77080] 2)  Medicare -1st Annual Wellness Visit [[id number]]

## 2010-02-12 NOTE — Progress Notes (Signed)
  Phone Note Call from Patient   Caller: [name] [phone]--[id number] Summary of Call: Home health saw patient today and her BP was 192/100 sitting and then 10 min later, standing 178/92. Patient heart rate was 106 but patient heart rate always runs high. Patient is on O2, but there are no signs of distress, alert, oriented, temp 97.4. Patient does have upcoming appt. Please advise.  She also request BP parameters also. Initial call taken by: Lucious Groves,  March 05, 2009 4:32 PM  Follow-up for Phone Call        [facility] informed to have pt call and schedule appt. I advised that we were contacted about pt's BP a few days ago and per MD she was advised to schedule. I told her that I left several messages for pt to call and schedule. I asked that [facility] inform pt to call office and schedule appt. I also advised to call back with any further questions or concerns. Follow-up by: Margaret Pyle, CMA,  March 05, 2009 4:53 PM     Appended Document:  pt called and stated she couldn't come today but has an appt tomorrow. HH will be checking on her at 1 today. I told pt to call us if she has anymore concerns or go to ER.

## 2010-02-12 NOTE — Assessment & Plan Note (Signed)
Summary: NP follow up - post hosp   CC:  post hosp follow up.  History of Present Illness: 65 yo female with known hx of COPD- O2 dependent.   March 07, 2009-- Last seen in 2009. She presents for post hosp follow up -  Admitted 2/3-2/15 for  Acute exacerbation of chronic obstructive pulmonary disease,     community-acquired pneumonia, and acute respiratory failure,    requiring intubation. Found unresponsive at home O2 was not working at home , O2 sat found to 60%.  She was  treated with IV steroids, IV antibiotics, and  nebulized bronchodilators.   Extubated February 18, 2009. Steroids weaned down to 10mg  daily    She was seen by Dr. Everardo All today, clonidine stopped and Benicar started. Since discharge feeling some better but still weak.. Denies chest pain, , orthopnea, hemoptysis, fever, n/v/d, edema, headache.     Medications Prior to Update: 1)  Multivitamins   Tabs (Multiple Vitamin) .... Take 1 Tablet By Mouth Once A Day 2)  Adult Aspirin Low Strength 81 Mg  Tbdp (Aspirin) .... Take 1 Tablet By Mouth Once A Day 3)  Benicar Hct 40-25 Mg Tabs (Olmesartan Medoxomil-Hctz) .... Take 1/2 Tablet By Mouth Once A Day 4)  Spiriva Handihaler 18 Mcg  Caps (Tiotropium Bromide Monohydrate) .... Inhale Contents of 1 Capsule Once A Day 5)  Lasix 40 Mg Tabs (Furosemide) .... Take 1 Tablet By Mouth Once A Day 6)  Celexa 40 Mg Tabs (Citalopram Hydrobromide) .... Take 1 Tablet By Mouth Once A Day 7)  Symbicort 160-4.5 Mcg/act Aero (Budesonide-Formoterol Fumarate) .... Inhale 2 Puff Using Inhaler Twice A Day 8)  Relion 70/30 70-30 %  Susp (Insulin Isophane & Regular) .Marland Kitchen.. 110 Units Qam, 90 Units Pm 9)  Proair Hfa 108 (90 Base) Mcg/act  Aers (Albuterol Sulfate) .... Inhale 2 Puffs Every 4 Hours As Needed 10)  Diltiazem Hcl Er Beads 240 Mg Xr24h-Cap (Diltiazem Hcl Er Beads) .Marland Kitchen.. 1 Qd 11)  Alprazolam 0.25 Mg Tabs (Alprazolam) .... As Needed Not To Exceed 2/day 12)  Mucinex Dm .... As Needed 13)  Relion  Insulin Syringe 31g X 5/16" 1 Ml Misc (Insulin Syringe-Needle U-100) .... Bid  Current Medications (verified): 1)  Multivitamins   Tabs (Multiple Vitamin) .... Take 1 Tablet By Mouth Once A Day 2)  Adult Aspirin Low Strength 81 Mg  Tbdp (Aspirin) .... Take 1 Tablet By Mouth Once A Day 3)  Taztia Xt 240 Mg Xr24h-Cap (Diltiazem Hcl Er Beads) .... Take 1 Tablet By Mouth Once A Day 4)  Benicar Hct 40-25 Mg Tabs (Olmesartan Medoxomil-Hctz) .... Take 1 Tablet By Mouth Once A Day 5)  Furosemide 40 Mg Tabs (Furosemide) .... Take 1 Tablet By Mouth Once A Day 6)  Citalopram Hydrobromide 40 Mg Tabs (Citalopram Hydrobromide) .... Take 1 Tablet By Mouth Once A Day 7)  Spiriva Handihaler 18 Mcg  Caps (Tiotropium Bromide Monohydrate) .... Inhale Contents of 1 Capsule Once A Day 8)  Oxygen 4 Lpm .... 24/7 9)  Symbicort 160-4.5 Mcg/act Aero (Budesonide-Formoterol Fumarate) .... Inhale 2 Puff Using Inhaler Twice A Day 10)  Relion 70/30 70-30 %  Susp (Insulin Isophane & Regular) .Marland Kitchen.. 110 Units Qam, 90 Units Pm 11)  Proair Hfa 108 (90 Base) Mcg/act  Aers (Albuterol Sulfate) .... Inhale 2 Puffs Every 4 To 6  Hours As Needed 12)  Tylenol Extra Strength 500 Mg Tabs (Acetaminophen) .... As Directed 13)  Alprazolam 0.25 Mg Tabs (Alprazolam) .... Take 1 Tablet By  Mouth Two Times A Day As Needed. Not To Exceed 2/day 14)  Mucinex Dm 30-600 Mg Xr12h-Tab (Dextromethorphan-Guaifenesin) .... Take 1-2 Tablets Every 12 Hours As Needed 15)  Relion Insulin Syringe 31g X 5/16" 1 Ml Misc (Insulin Syringe-Needle U-100) .... Bid  Allergies (verified): 1)  ! Avandia (Rosiglitazone Maleate) 2)  Actos (Pioglitazone Hcl)  Past History:  Past Medical History: Last updated: 03/07/2009 RENAL INSUFFICIENCY (ICD-588.9) EDEMA (ICD-782.3) ENCOUNTER FOR LONG-TERM USE OF OTHER MEDICATIONS (ICD-V58.69) COUGH (ICD-786.2) PNEUMONIA (ICD-486) CARPAL TUNNEL SYNDROME, BILATERAL (ICD-354.0) HAND PAIN, LEFT (ICD-729.5) ROUTINE GENERAL MEDICAL  EXAM@HEALTH  CARE FACL (ICD-V70.0) CT, CHEST, ABNORMAL (ICD-793.1) MORBID OBESITY (ICD-278.01) ANXIETY (ICD-300.00) HYPERTENSION (ICD-401.9) DIABETES MELLITUS, TYPE II (ICD-250.00) COPD (ICD-496)  Family History: Last updated: 03/07/2009 asthma - PGF heart disease - mother rheumatism - mother, PGF cancer - mother (pancreatic), father (liver) DM - father stroke -  mother  Social History: Last updated: 03/07/2009 former smoker quit 2010 x12yrs 2ppd no alcohol divorced 2 children disabled, did payroll  Risk Factors: Smoking Status: quit (11/08/2006)  Family History: asthma - PGF heart disease - mother rheumatism - mother, PGF cancer - mother (pancreatic), father (liver) DM - father stroke -  mother  Social History: former smoker quit 2010 x108yrs 2ppd no alcohol 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 Yes Comments Medications reviewed with patient Daytime contact number verified with patient. Boone Master CNA  March 07, 2009 3:29 PM    Physical Exam  Additional Exam:  GEN: A/Ox3; pleasant , NAD HEENT:  /AT, , EACs-clear, TMs-wnl, NOSE-clear, THROAT-clear NECK:  Supple w/ fair ROM; no JVD; normal carotid impulses w/o bruits; no thyromegaly or nodules palpated; no lymphadenopathy. RESP  Clear to P & A; w/o, wheezes/ rales/ or rhonchi. CARD:  RRR, no m/r/g   GI:   Soft & nt; nml bowel sounds; no organomegaly or masses detected. Musco: Warm bil,  no calf tenderness edema, clubbing, pulses intact Neuro: intact w/ no  focal deficits noted.    Impression & Recommendations:  Problem # 1:  COPD (ICD-496) recent exacerbation w/ Hypoxic resp failure requiring vent dependence  Now resolved  Meds reviewed with pt education and computerized med calendar completed/adjusted.     Problem # 2:  HYPERTENSION (ICD-401.9) cont on same meds.  Her updated medication list for this problem includes:    Taztia Xt 240 Mg Xr24h-cap (Diltiazem hcl er beads) .Marland Kitchen... Take 1 tablet by mouth once a day    Benicar Hct 40-25 Mg Tabs (Olmesartan medoxomil-hctz) .Marland Kitchen... Take 1 tablet by mouth once a day    Furosemide 40 Mg Tabs (Furosemide) .Marland Kitchen... Take 1 tablet by mouth once a day  BP today: 152/80 Prior BP: 154/78 (03/07/2009)  Labs Reviewed: K+: 4.0 (11/11/2008) Creat: : 1.1 (11/11/2008)   Chol: 146 (06/18/2008)   HDL: 39.40 (06/18/2008)   LDL: 78 (  07/04/2007)   TG: 278.0 (06/18/2008)  Medications Added to Medication List This Visit: 1)  Taztia Xt 240 Mg Xr24h-cap (Diltiazem hcl er beads) .... Take 1 tablet by mouth once a day 2)  Benicar Hct 40-25 Mg Tabs (Olmesartan medoxomil-hctz) .... Take 1 tablet by mouth once a day 3)  Furosemide 40 Mg Tabs (Furosemide) .... Take 1 tablet by mouth once a day 4)  Citalopram Hydrobromide 40 Mg Tabs (Citalopram hydrobromide) .... Take 1 tablet by mouth once a day 5)  Oxygen 4 Lpm  .... 24/7 6)  Proair Hfa 108 (90 Base) Mcg/act Aers (Albuterol sulfate) .... Inhale 2 puffs every 4 to 6  hours as needed 7)  Tylenol Extra Strength 500 Mg Tabs (Acetaminophen) .... As directed 8)  Alprazolam 0.25 Mg Tabs (Alprazolam) .... Take 1 tablet by mouth two times a day as needed. not to exceed 2/day 9)  Mucinex Dm 30-600 Mg Xr12h-tab (Dextromethorphan-guaifenesin) .... Take 1-2 tablets every 12 hours as needed  Complete Medication List: 1)  Multivitamins Tabs (Multiple vitamin) .... Take 1 tablet by mouth once a day 2)  Adult Aspirin Low Strength 81 Mg Tbdp (Aspirin) .... Take 1 tablet by mouth once a  day 3)  Taztia Xt 240 Mg Xr24h-cap (Diltiazem hcl er beads) .... Take 1 tablet by mouth once a day 4)  Benicar Hct 40-25 Mg Tabs (Olmesartan medoxomil-hctz) .... Take 1 tablet by mouth once a day 5)  Furosemide 40 Mg Tabs (Furosemide) .... Take 1 tablet by mouth once a day 6)  Citalopram Hydrobromide 40 Mg Tabs (Citalopram hydrobromide) .... Take 1 tablet by mouth once a day 7)  Spiriva Handihaler 18 Mcg Caps (Tiotropium bromide monohydrate) .... Inhale contents of 1 capsule once a day 8)  Oxygen 4 Lpm  .... 24/7 9)  Symbicort 160-4.5 Mcg/act Aero (Budesonide-formoterol fumarate) .... Inhale 2 puff using inhaler twice a day 10)  Relion 70/30 70-30 % Susp (Insulin isophane & regular) .Marland Kitchen.. 110 units qam, 90 units pm 11)  Proair Hfa 108 (90 Base) Mcg/act Aers (Albuterol sulfate) .... Inhale 2 puffs every 4 to 6  hours as needed 12)  Tylenol Extra Strength 500 Mg Tabs (Acetaminophen) .... As directed 13)  Alprazolam 0.25 Mg Tabs (Alprazolam) .... Take 1 tablet by mouth two times a day as needed. not to exceed 2/day 14)  Mucinex Dm 30-600 Mg Xr12h-tab (Dextromethorphan-guaifenesin) .... Take 1-2 tablets every 12 hours as needed 15)  Relion Insulin Syringe 31g X 5/16" 1 Ml Misc (Insulin syringe-needle u-100) .... Bid  Other Orders: Est. Patient Level III (91478)  Patient Instructions: 1)  Continue on same meds.  2)  follow up Dr. Sherene Sires in 2-3 weeks and as needed  3)  Follow med calendar closley and bring to each visit.  4)  Please contact office for sooner follow up if symptoms do not improve or worsen

## 2010-02-12 NOTE — Miscellaneous (Signed)
Summary: Physician's Interim Order/[facility]  Physician's Interim Order/[facility]   Imported By: Sherian Rein 05/30/2009 15:03:58  _____________________________________________________________________  External Attachment:    Type:   Image     Comment:   External Document

## 2010-02-12 NOTE — Progress Notes (Signed)
Summary: neb ok  Phone Note Call from Patient Call back at [phone]   Caller: Patient Call For: wert Reason for Call: Talk to Nurse Summary of Call: [facility] needs an order for a nebulizer for pt/ Initial call taken by: Eugene Gavia,  September 02, 2009 4:06 PM  Follow-up for Phone Call        spoke with pt and she states she needs an order for a neb machine sent to [place]. I advised pt that she is overdue for an appt, so I scheduled her to see MW on 09-04-09 at 11 am. Please advsie if ok to send order for neb machine. Thanks. Carron Curie CMA  September 02, 2009 4:24 PM ok Follow-up by: Nyoka Cowden MD,  September 02, 2009 4:30 PM  Additional Follow-up for Phone Call Additional follow up Details #1::        Order sent to [facility] for neb machine thru [facility], [place], spoke with pt.  Informed her of this but advised her she will need to keep scheduled OV for any additional rxs.  She verbalized understanding.   Additional Follow-up by: Gweneth Dimitri RN,  September 02, 2009 4:50 PM

## 2010-02-12 NOTE — Progress Notes (Signed)
  Phone Note Call from Patient Call back at [phone] Marylene Land / RN   Caller: Patient Call For: DR [facility] Summary of Call: BP right arm 175/80, left arm 193/85. Marylene Land, RN left message for nurse. No chest pain. Initial call taken by: Verdell Face,  March 06, 2009 12:15 PM  Follow-up for Phone Call        Called nurse back and she just wanted it to be noted. Pt told her that she had spoke with me this morning and couldn't come until tomorrwo and pt told HH that I told her to go to ER if any chest pains or anything else happened. Follow-up by: Josph Macho RMA,  March 06, 2009 1:00 PM

## 2010-02-12 NOTE — Progress Notes (Signed)
Summary: nos appt  Phone Note Call from Patient   Caller: [email or web address]  Call For: wert Summary of Call: Rsc 8/25 nos to 9/8 @ 10:45a. Initial call taken by: Darletta Moll,  September 05, 2009 10:28 AM

## 2010-02-12 NOTE — Progress Notes (Signed)
Summary: [facility]  Phone Note From Other Clinic   Caller: Nurse Jannet Askew [phone] Summary of Call: HHRN called to inform MD that pt had last visit today, BP was increased at 160/74 RT arm. RN is requesting, if any, advise from MD Initial call taken by: Margaret Pyle, CMA,  June 18, 2009 4:12 PM  Follow-up for Phone Call        ov today or tomorrow Follow-up by: Minus Breeding MD,  June 19, 2009 9:26 AM  Additional Follow-up for Phone Call Additional follow up Details #1::        Pt and HHRN informed.  Per RN pt is non-compliant, pt was D/C fron [facility] services as well.  Pt states she will call back to sch when she has transportation. Additional Follow-up by: Margaret Pyle, CMA,  June 19, 2009 10:22 AM

## 2010-02-12 NOTE — Miscellaneous (Signed)
Summary: Plan of Care & Treatment/[facility]  Plan of Care & Treatment/[facility]   Imported By: Sherian Rein 05/14/2009 11:42:12  _____________________________________________________________________  External Attachment:    Type:   Image     Comment:   External Document

## 2010-02-12 NOTE — Progress Notes (Signed)
  Phone Note Call from Patient   Caller: [phone] Donalee Citrin Call For: Dr Everardo All Summary of Call: Donalee Citrin, Physical Therapist called and needs "verbal" order to continue w/home physical therapy.  Initial call taken by: Verdell Face,  February 28, 2009 10:42 AM  Follow-up for Phone Call        f/u ov is due.  let's address then. Follow-up by: Minus Breeding MD,  February 28, 2009 12:41 PM  Additional Follow-up for Phone Call Additional follow up Details #1::        PT informed via VM. told to call back with any further questions or concerns Additional Follow-up by: Margaret Pyle, CMA,  February 28, 2009 1:11 PM

## 2010-02-12 NOTE — Progress Notes (Signed)
Summary: verbral orders for home visits-lmtcb  Phone Note Call from Patient Call back at [phone]   Caller: [facility] Call For: wert Summary of Call: need verbral orders to start home visits Initial call taken by: Rickard Patience,  February 28, 2009 2:41 PM  Follow-up for Phone Call        please advise if ok to place order.  thanks. Carron Curie CMA  February 28, 2009 3:27 PM ok with me Follow-up by: Nyoka Cowden MD,  February 28, 2009 3:44 PM  Additional Follow-up for Phone Call Additional follow up Details #1::        LMTCB. Carron Curie CMA  February 28, 2009 4:02 PM          orders given for home visits  Additional Follow-up by: Philipp Deputy CMA,  March 03, 2009 2:31 PM

## 2010-02-12 NOTE — Progress Notes (Signed)
Summary: HHRN?  Phone Note Other Incoming   Caller[facility]lene Land [facility] [phone] Summary of Call: HHRN called stating that pt's BP in LT arm was 186/76 and BP in RT was 178/80. pt told RN that she was advised by MD to stop Clonidine because it caused depression. please advise Initial call taken by: Margaret Pyle, CMA,  February 28, 2009 11:41 AM  Follow-up for Phone Call        f/u ov is due.  let's addresss then Follow-up by: Minus Breeding MD,  February 28, 2009 12:41 PM  Additional Follow-up for Phone Call Additional follow up Details #1::        [facility] informed, stated that she would inform pt. I called and left message on machine for pt to call and schedule appt. Additional Follow-up by: Margaret Pyle, CMA,  February 28, 2009 1:12 PM

## 2010-02-12 NOTE — Progress Notes (Signed)
Summary: citalopram  Phone Note Refill Request Message from:  Pharmacy  Refills Requested: Medication #1:  CITALOPRAM HYDROBROMIDE 40 MG TABS Take 1 tablet by mouth once a day   Dosage confirmed as above?Dosage Confirmed   Last Refilled: 04/14/2009 Please advise   Method Requested: Electronic Initial call taken by: Brenton Grills MA,  July 21, 2009 11:23 AM  Follow-up for Phone Call        i refilled Follow-up by: Minus Breeding MD,  July 21, 2009 12:10 PM    Prescriptions: CITALOPRAM HYDROBROMIDE 40 MG TABS (CITALOPRAM HYDROBROMIDE) Take 1 tablet by mouth once a day  #30 x 2   Entered and Authorized by:   Minus Breeding MD   Signed by:   Minus Breeding MD on 07/21/2009   Method used:   Electronically to        [facility] (retail)       [address]       [place], [place]  [address]       Ph: [phone]       Fax: [phone]   RxID:   [id number]

## 2010-02-12 NOTE — Progress Notes (Signed)
Summary: ELEVATED BLOOD PRESSURE  Phone Note Call from Patient Call back at [phone]   Caller: NURSE ANGELA Call For: [name] Summary of Call: PT'S BLOOD PRESSURE IS 186/76 LEFT ARM AND RT 178/80 HAVE NOT TAKEN CLONIDINE FOR 1 MONTH DR Everardo All TOOK HER OFF AND DID NOT REPLACE IT WITH ANOTHER MED Initial call taken by: Rickard Patience,  February 28, 2009 10:29 AM  Follow-up for Phone Call        Kathlee Nations that this needed to be addressed with Dr. Everardo All, because according to d/c summary she was to be restarted on clonidine until she saw an MD. Gave her the number to Dr. Everardo All office.Carron Curie CMA  February 28, 2009 11:31 AM

## 2010-02-12 NOTE — Progress Notes (Signed)
Summary: [name]  Phone Note Outgoing Call   Summary of Call: Mailed completed paperwork to [place] and sent a copy to be scanned. Initial call taken by: Josph Macho RMA,  May 28, 2009 9:21 AM

## 2010-02-12 NOTE — Miscellaneous (Signed)
Summary: Physician Verbal Order/[facility]  Physician Verbal Order/[facility]   Imported By: Sherian Rein 03/26/2009 08:36:33  _____________________________________________________________________  External Attachment:    Type:   Image     Comment:   External Document

## 2010-02-12 NOTE — Miscellaneous (Signed)
Summary: Physician Orders/[facility]  Physician Orders/[facility]   Imported By: Sherian Rein 05/14/2009 09:56:28  _____________________________________________________________________  External Attachment:    Type:   Image     Comment:   External Document

## 2010-02-12 NOTE — Miscellaneous (Signed)
Summary: Denied/[facility]  Denied/[facility]   Imported By: Lester Sewaren 10/06/2009 09:33:17  _____________________________________________________________________  External Attachment:    Type:   Image     Comment:   External Document

## 2010-02-12 NOTE — Miscellaneous (Signed)
Summary: Discharge from PT/[facility]  Discharge from PT/[facility]   Imported By: Sherian Rein 04/16/2009 10:06:12  _____________________________________________________________________  External Attachment:    Type:   Image     Comment:   External Document

## 2010-02-12 NOTE — Miscellaneous (Signed)
Summary: SN orders/[facility]  SN orders/[facility]   Imported By: Sherian Rein 05/19/2009 12:21:29  _____________________________________________________________________  External Attachment:    Type:   Image     Comment:   External Document

## 2010-02-12 NOTE — Assessment & Plan Note (Signed)
Summary: FU/NWS   Vital Signs:  Patient profile:   65 year old female Height:      66 inches (167.64 cm) Weight:      273.25 pounds (124.20 kg) BMI:     44.26 O2 Sat:      91 % on 4 L/min Temp:     98.2 degrees F (36.78 degrees C) oral Pulse rate:   120 / minute BP sitting:   144 / 68  (left arm) Cuff size:   large  Vitals Entered By: Brenton Grills MA (July 24, 2009 2:20 PM)  O2 Flow:  4 L/min CC: F/U appt/form from [facility] Is Patient Diabetic? Yes Comments pt is due for Pap, Mammogram, and maybe due for tetanus   CC:  F/U appt/form from [facility].  History of Present Illness: the status of at least 3 ongoing medical problems is addressed today: dm:  no cbg record, but states cbg's are "high."  no hypoglycemic sxs. dyslipidemia:  she says she has never taken cholesterol med.  no change in weight. chest pain:  pt says this is chronic and non-exertional.   Current Medications (verified): 1)  Multivitamins   Tabs (Multiple Vitamin) .... Take 1 Tablet By Mouth Once A Day 2)  Adult Aspirin Low Strength 81 Mg  Tbdp (Aspirin) .... Take 1 Tablet By Mouth Once A Day 3)  Taztia Xt 240 Mg Xr24h-Cap (Diltiazem Hcl Er Beads) .... Take 1 Tablet By Mouth Once A Day 4)  Benicar Hct 40-25 Mg Tabs (Olmesartan Medoxomil-Hctz) .... Take 1 Tablet By Mouth Once A Day 5)  Furosemide 40 Mg Tabs (Furosemide) .... Take 1 Tablet By Mouth Once A Day 6)  Citalopram Hydrobromide 40 Mg Tabs (Citalopram Hydrobromide) .... Take 1 Tablet By Mouth Once A Day 7)  Spiriva Handihaler 18 Mcg  Caps (Tiotropium Bromide Monohydrate) .... Inhale Contents of 1 Capsule Once A Day 8)  Oxygen 4 Lpm .... 24/7 9)  Symbicort 160-4.5 Mcg/act Aero (Budesonide-Formoterol Fumarate) .... Inhale 2 Puff Using Inhaler Twice A Day 10)  Relion 70/30 70-30 %  Susp (Insulin Isophane & Regular) .Marland Kitchen.. 110 Units Qam, 90 Units Pm 11)  Proair Hfa 108 (90 Base) Mcg/act  Aers (Albuterol Sulfate) .... Inhale 2 Puffs  Every 4 To 6  Hours As Needed 12)  Tylenol Extra Strength 500 Mg Tabs (Acetaminophen) .... As Directed 13)  Alprazolam 0.25 Mg Tabs (Alprazolam) .... Take 1 Tablet By Mouth Two Times A Day As Needed. Not To Exceed 2/day 14)  Mucinex Dm 30-600 Mg Xr12h-Tab (Dextromethorphan-Guaifenesin) .... Take 1-2 Tablets Every 12 Hours As Needed 15)  Relion Insulin Syringe 31g X 5/16" 1 Ml Misc (Insulin Syringe-Needle U-100) .... Bid  Allergies (verified): 1)  ! Avandia (Rosiglitazone Maleate) 2)  Actos (Pioglitazone Hcl)  Past History:  Past Medical History: Last updated: 03/07/2009 RENAL INSUFFICIENCY (ICD-588.9) EDEMA (ICD-782.3) ENCOUNTER FOR LONG-TERM USE OF OTHER MEDICATIONS (ICD-V58.69) COUGH (ICD-786.2) PNEUMONIA (ICD-486) CARPAL TUNNEL SYNDROME, BILATERAL (ICD-354.0) HAND PAIN, LEFT (ICD-729.5) ROUTINE GENERAL MEDICAL EXAM@HEALTH  CARE FACL (ICD-V70.0) CT, CHEST, ABNORMAL (ICD-793.1) MORBID OBESITY (ICD-278.01) ANXIETY (ICD-300.00) HYPERTENSION (ICD-401.9) DIABETES MELLITUS, TYPE II (ICD-250.00) COPD (ICD-496)  Review of Systems  The patient denies syncope.         sob is slightly better than usual  Physical Exam  General:  obese.  no distress.  has 02 on.   Chest Wall:  nontender Lungs:  Clear to auscultation bilaterally. Normal respiratory effort.  Heart:  Regular rate and rhythm without murmurs  or gallops noted. Normal S1,S2.   Additional Exam:  Hemoglobin A1C       [H]  7.9 %  Hemoglobin           [L]  11.6 g/dL                   04.5-40.9   Hematocrit           [L]  34.5 %                 Impression & Recommendations:  Problem # 1:  DIABETES MELLITUS, TYPE II (ICD-250.00) this is the best control this pt should aim for, given this regimen, which does match insulin to her changing needs throughout the day  Problem # 2:  CHEST PAIN (ICD-786.50) uncertain etiology atypical for cardiogenic pain  Problem # 3:  HYPERCHOLESTEROLEMIA (ICD-272.0) Assessment:  New  Problem # 4:  HYPERTENSION (ICD-401.9) needs increased rx  Problem # 5:  ANEMIA (ICD-285.9) Assessment: New uncertain etiology  Medications Added to Medication List This Visit: 1)  Diltiazem Hcl Er Beads 300 Mg Xr24h-cap (Diltiazem hcl er beads) .Marland Kitchen.. 1 once daily  Other Orders: T-Parathyroid Hormone, Intact w/ Calcium (81191-47829) EKG w/ Interpretation (93000) TLB-Lipid Panel (80061-LIPID) TLB-BMP (Basic Metabolic Panel-BMET) (80048-METABOL) TLB-CBC Platelet - w/Differential (85025-CBCD) TLB-Hepatic/Liver Function Pnl (80076-HEPATIC) TLB-TSH (Thyroid Stimulating Hormone) (84443-TSH) TLB-A1C / Hgb A1C (Glycohemoglobin) (83036-A1C) TLB-Microalbumin/Creat Ratio, Urine (82043-MALB) TLB-Udip w/ Micro (81001-URINE) Cardiolite (Cardiolite) Est. Patient Level IV (56213) l  Patient Instructions: 1)  for your health, it is critically important that you make and keep doctor appointments as advised.  if not, we will be unable to continue to provide medical services to you. 2)  change clonidine to benicar-hct 40/25, 1/day.  you can continue the samples you have at home. 3)  check your blood sugar 2 times a day.  vary the time of day when you check, between before the 3 meals, and at bedtime.  also check if you have symptoms of your blood sugar being too high or too low.  please keep a record of the readings and bring it to your next appointment here.  please call us sooner if you are having low blood sugar episodes. 4)  Please schedule a "medicare wellness" appointment in 2 months. 5)  increase diltiazem to 300 mg once daily. 6)  let's check a special type of heart x-ray.  you will be called with a day and time for an appointment 7)  (update: i left message on phone-tree:  go to lab for cbc, iron panel, and b-12, all 285.9) Prescriptions: DILTIAZEM HCL ER BEADS 300 MG XR24H-CAP (DILTIAZEM HCL ER BEADS) 1 once daily  #30 x 11   Entered and Authorized by:   Minus Breeding MD   Signed by:    Minus Breeding MD on 07/24/2009   Method used:   Electronically to        [facility] (retail)       [address]       [place], [place]  [address]       Ph: [phone]       Fax: [phone]   RxID:   [id number] ALPRAZOLAM 0.25 MG TABS (ALPRAZOLAM) Take 1 tablet by mouth two times a day as needed. Not to exceed 2/day  #60 x 2   Entered and Authorized by:   Minus Breeding MD   Signed by:  Minus Breeding MD on 07/24/2009   Method used:   Print then Give to Patient   RxID:   [id number]

## 2010-02-12 NOTE — Letter (Signed)
Summary: [facility]  [address]   [place], [place] [address]   Phone: [phone]  Fax: [phone]    10/24/2009  [facility] [address] [place], [place]  [address]  Dear Ms. Moll,   Please call our office at [phone] to obtain your recent chest x-ray results. Thank You.        Sincerely,   [facility]

## 2010-02-12 NOTE — Miscellaneous (Signed)
Summary: Appointment No Show  Appointment status changed to no show by LinkLog[employer] on 08/06/2009 3:56 PM.  No Show Comments ---------------- CRO  Appointment Information ----------------------- Appt Type:  CARDIOLOGY NUCLEAR TESTING      Date:  Wednesday, August 06, 2009      Time:  2:00 PM for 15 min   Urgency:  Routine   Made By:  Pearson Grippe  To Visit:  [id number]    Reason:  CRO  Appt Comments ------------- -- 08/06/09 15:56: (CEMR) NO SHOW -- CRO -- 07/31/09 12:32: (CEMR) BOOKED -- Routine CARDIOLOGY NUCLEAR TESTING at 08/06/2009 2:00 PM for 15 min CRO

## 2010-02-12 NOTE — Miscellaneous (Signed)
Summary: Appointment Canceled  Appointment status changed to canceled by [employer]Log[employer] on 07/31/2009 12:24 PM.  Cancellation Comments --------------------- stress only/dx:chest pain/wt:273/ins:mcr/dr Briyana Badman/jml  Appointment Information ----------------------- Appt Type:  CARDIOLOGY NUCLEAR TESTING      Date:  Thursday, July 31, 2009      Time:  8:15 AM for 15 min   Urgency:  Routine   Made By:  Pearson Grippe  To Visit:  [id number]    Reason:  stress only/dx:chest pain/wt:273/ins:mcr/dr Darly Fails/jml  Appt Comments ------------- -- 07/31/09 12:24: (CEM[facility]) CANCELED -- stress only/dx:chest pain/wt:273/ins:mcr/dr Aarish Rockers/jml -- 07/25/09 8:28: ([facility]EM[facility]) BOOKED -- Routine CARDIOLOGY NUCLEAR TESTING at 07/31/2009 8:15 AM for 15 min stress only/dx:chest pain/wt:273/ins:mcr/dr Ruhani Umland/jml

## 2010-02-12 NOTE — Progress Notes (Signed)
Summary: nos appt  Phone Note Call from Patient   Caller: [email or web address]  Call For: wert Summary of Call: Pt states she will call to rsc nos from 5/5. Initial call taken by: Darletta Moll,  May 16, 2009 11:05 AM

## 2010-02-12 NOTE — Assessment & Plan Note (Signed)
Summary: FU/ NWS  #   Vital Signs:  Patient profile:   65 year old female Height:      66 inches (167.64 cm) Weight:      264.50 pounds (120.23 kg) O2 Sat:      94 % on 4 L/min Temp:     97.5 degrees F (36.39 degrees C) oral Pulse rate:   123 / minute BP sitting:   154 / 78  (left arm) Cuff size:   large  Vitals Entered By: Josph Macho RMA (March 07, 2009 2:17 PM)  O2 Flow:  4 L/min CC: follow-up visit/ pt needs a new RX for Clonidine/pt states she is no longer taking Benicar or Fluconazole/ CF Is Patient Diabetic? Yes   CC:  follow-up visit/ pt needs a new RX for Clonidine/pt states she is no longer taking Benicar or Fluconazole/ CF.  History of Present Illness: pt asks to change clonidine back to benicar-hct, due to depression sxs.  she was recently oin the hospital for respiratory failure. pt states 1 week of moderate pain rad from the lower back to the left lateral thigh, and associated numbness.   no cbg record, but states cbg "was 240 last time i took it."    Current Medications (verified): 1)  Proair Hfa 108 (90 Base) Mcg/act  Aers (Albuterol Sulfate) .... Inhale 2 Puffs Every 4 Hours As Needed 2)  Alprazolam 0.25 Mg Tabs (Alprazolam) .... As Needed Not To Exceed 2/day 3)  Benicar Hct 40-25 Mg Tabs (Olmesartan Medoxomil-Hctz) .... Take 1/2 Tablet By Mouth Once A Day 4)  Celexa 40 Mg Tabs (Citalopram Hydrobromide) .... Take 1 Tablet By Mouth Once A Day 5)  Lasix 40 Mg Tabs (Furosemide) .... Take 1 Tablet By Mouth Once A Day 6)  Symbicort 160-4.5 Mcg/act Aero (Budesonide-Formoterol Fumarate) .... Inhale 2 Puff Using Inhaler Twice A Day 7)  Multivitamins   Tabs (Multiple Vitamin) .... Take 1 Tablet By Mouth Once A Day 8)  Adult Aspirin Low Strength 81 Mg  Tbdp (Aspirin) .... Take 1 Tablet By Mouth Once A Day 9)  Spiriva Handihaler 18 Mcg  Caps (Tiotropium Bromide Monohydrate) .... Inhale Contents of 1 Capsule Once A Day 10)  Relion 70/30 70-30 %  Susp (Insulin  Isophane & Regular) .Marland Kitchen.. 110 Units Qam, 90 Units Pm 11)  Bd Insulin Syringe 31g X 5/16" 0.5 Ml  Misc .... Use As Directed 12)  Clonidine Hcl 0.1 Mg Tabs (Clonidine Hcl) .Marland Kitchen.. 1 Three Times A Da 13)  Bd Pen Needle Short U/f 31g X 8 Mm Misc (Insulin Pen Needle) .... Use As Directed 14)  Relion Insulin Syringe 31g X 5/16" 1 Ml Misc (Insulin Syringe-Needle U-100) .... Bid 15)  Fluconazole 150 Mg Tabs (Fluconazole) .Marland Kitchen.. 1 Qd 16)  Diltiazem Hcl Er Beads 240 Mg Xr24h-Cap (Diltiazem Hcl Er Beads) .Marland Kitchen.. 1 Qd  Allergies (verified): 1)  ! Avandia (Rosiglitazone Maleate) 2)  Actos (Pioglitazone Hcl)  Past History:  Past Medical History: RENAL INSUFFICIENCY (ICD-588.9) EDEMA (ICD-782.3) ENCOUNTER FOR LONG-TERM USE OF OTHER MEDICATIONS (ICD-V58.69) COUGH (ICD-786.2) PNEUMONIA (ICD-486) CARPAL TUNNEL SYNDROME, BILATERAL (ICD-354.0) HAND PAIN, LEFT (ICD-729.5) ROUTINE GENERAL MEDICAL EXAM@HEALTH  CARE FACL (ICD-V70.0) CT, CHEST, ABNORMAL (ICD-793.1) MORBID OBESITY (ICD-278.01) ANXIETY (ICD-300.00) HYPERTENSION (ICD-401.9) DIABETES MELLITUS, TYPE II (ICD-250.00) COPD (ICD-496)  Review of Systems  The patient denies hypoglycemia and fever.    Physical Exam  General:  morbidly obese.  no distress  Msk:  back is nontender gait is normal and steady Neurologic:  sensation is  decreased to touch on the right lateral thigh   Impression & Recommendations:  Problem # 1:  HYPERTENSION (ICD-401.9) needs increased rx  Problem # 2:  back pain uncertain etiology. with radicular sxs  Problem # 3:  DIABETES MELLITUS, TYPE II (ICD-250.00) uncertain control  Medications Added to Medication List This Visit: 1)  Mucinex Dm  .... As needed  Other Orders: TLB-Udip w/ Micro (81001-URINE) Est. Patient Level IV (40981)  Patient Instructions: 1)  change clonidine to benicar-hct 40/25, 1/day.  you can continue the samples you have at home. 2)  tylenol as needed for  back pain. 3)  urine test  today. 4)  check your blood sugar 2 times a day.  vary the time of day when you check, between before the 3 meals, and at bedtime.  also check if you have symptoms of your blood sugar being too high or too low.  please keep a record of the readings and bring it to your next appointment here.  please call us sooner if you are having low blood sugar episodes. 5)  return 3 weeks 6)  we can evaluate the back pain further if it persists

## 2010-02-12 NOTE — Progress Notes (Signed)
Summary: results  Phone Note Call from Patient   Caller: Patient--[phone] Call For: wert Reason for Call: Talk to Nurse Summary of Call: Patient returning call, also received letter in mail about result of chest xray. Initial call taken by: Lehman Prom,  October 30, 2009 4:46 PM  Follow-up for Phone Call        gave pt cxr results Follow-up by: Philipp Deputy CMA,  October 30, 2009 4:54 PM

## 2010-02-12 NOTE — Progress Notes (Signed)
Summary: Rx req  Phone Note Call from Patient   Caller: Patient [phone] Summary of Call: Pt called requesting Rx for shower chair to [facility] Initial call taken by: Margaret Pyle, CMA,  September 02, 2009 4:16 PM  Follow-up for Phone Call        i need to know the dx for which pt needs this, as medicare needs a dx code. Follow-up by: Minus Breeding MD,  September 03, 2009 1:45 PM  Additional Follow-up for Phone Call Additional follow up Details #1::        Per pt, COPD and her weight (morbid obesity) Additional Follow-up by: Margaret Pyle, CMA,  September 03, 2009 2:02 PM    Additional Follow-up for Phone Call Additional follow up Details #2::    i printed Follow-up by: Minus Breeding MD,  September 04, 2009 7:58 AM  Additional Follow-up for Phone Call Additional follow up Details #3:: Details for Additional Follow-up Action Taken: Rx faxed to [facility]  Additional Follow-up by: Margaret Pyle, CMA,  September 04, 2009 8:31 AM  New/Updated Medications: * SHOWER CHAIR 278.01   496 Prescriptions: SHOWER CHAIR 278.01   496  #1 x 0   Entered and Authorized by:   Minus Breeding MD   Signed by:   Minus Breeding MD on 09/04/2009   Method used:   Printed then faxed to ...       [facility] [address] (retail)       [address]       [place], [place]  [address]       Ph: [phone]       Fax: [phone]   RxID:   [id number]   Appended Document: Rx req Rx faxed back from [facility] stating that they only do respiratory supllies. Attempted to contact pt to clarify, home # and # attacted to phone note are out of service.

## 2010-02-12 NOTE — Progress Notes (Signed)
Summary: nos appt  Phone Note Call from Patient   Caller: [email or web address]  Call For: wert Summary of Call: Rsc nos from 9/8 to 10/5 @ 4:15p. Initial call taken by: Darletta Moll,  September 19, 2009 9:28 AM     Appended Document: nos appt let her know we will need to consider discharge from pulmonary practice if misses next appt without letting us know in advance as we are reserving space for her   Appended Document: nos appt ATC pt at home number this has been d/c'ed.  ATC pt on cell number, NA and no option to leave a msg, WCB.  Appended Document: nos appt Spoke with pt and advised of the above recs per MW.  Pt verbalized understanding.

## 2010-02-12 NOTE — Assessment & Plan Note (Signed)
Summary: Pulmonary/ ext f/u ov with HFA teaching @ 50% effective   Primary Ibrahim Mcpheeters/Referring Lyra Alaimo:  Dr. Romero Belling  CC:  Acute visit.  Pt c/o increased SOB x 2 wks.  She states that she is only SOB with exertion and sometimes gets out of breath just walking from room to room.  She also c/o prod cough with green sputum x 2 wks..  History of Present Illness: 9 yowf with morbid obesity/ aodm  who quit smoking 2010  with known hx of COPD- O2 dependent at time she quit.  March 07, 2009-- Last seen in 2009. She presents for post hosp follow up -  Admitted 2/3-2/15 for  Acute exacerbation of chronic obstructive pulmonary disease,     community-acquired pneumonia, and acute respiratory failure,    requiring intubation. Found unresponsive at home O2 was not working at home , O2 sat found to 60%.  She was  treated with IV steroids, IV antibiotics, and  nebulized bronchodilators.   Extubated February 18, 2009. Steroids weaned down to 10mg  daily   > weaned off with no flare  October 15, 2009 Acute visit.  Pt c/o increased SOB x 2 wks.  She states that she is only SOB with exertion and sometimes gets out of breath just walking from room to room.  She also c/o prod cough with green sputum x 2 wks.  usually use proaire once a day but now needing every 4 hours.  doe even on 02 @ 24 hours per day.  Pt denies any significant sore throat, dysphagia, itching, sneezing,  nasal congestion or excess secretions,  fever, chills, sweats, unintended wt loss, pleuritic or exertional cp, hempoptysis, change in activity tolerance  orthopnea pnd or leg swelling   Current Medications (verified): 1)  Multivitamins   Tabs (Multiple Vitamin) .... Take 1 Tablet By Mouth Once A Day 2)  Adult Aspirin Low Strength 81 Mg  Tbdp (Aspirin) .... Take 1 Tablet By Mouth Once A Day 3)  Benicar Hct 40-25 Mg Tabs (Olmesartan Medoxomil-Hctz) .... Take 1 Tablet By Mouth Once A Day 4)  Furosemide 40 Mg Tabs (Furosemide) .... Take 1 Tablet  By Mouth Once A Day 5)  Citalopram Hydrobromide 40 Mg Tabs (Citalopram Hydrobromide) .... Take 1 Tablet By Mouth Once A Day 6)  Spiriva Handihaler 18 Mcg  Caps (Tiotropium Bromide Monohydrate) .... Inhale Contents of 1 Capsule Once A Day 7)  Oxygen 4 Lpm .... 24/7 8)  Symbicort 160-4.5 Mcg/act Aero (Budesonide-Formoterol Fumarate) .... Inhale 2 Puff Using Inhaler Twice A Day 9)  Relion 70/30 70-30 %  Susp (Insulin Isophane & Regular) .Marland Kitchen.. 110 Units Qam, 90 Units Pm 10)  Proair Hfa 108 (90 Base) Mcg/act  Aers (Albuterol Sulfate) .... Inhale 2 Puffs Every 4 To 6  Hours As Needed 11)  Tylenol Extra Strength 500 Mg Tabs (Acetaminophen) .... As Directed 12)  Alprazolam 0.25 Mg Tabs (Alprazolam) .... Take 1 Tablet By Mouth Two Times A Day As Needed. Not To Exceed 2/day 13)  Mucinex Dm 30-600 Mg Xr12h-Tab (Dextromethorphan-Guaifenesin) .... Take 1-2 Tablets Every 12 Hours As Needed 14)  Relion Insulin Syringe 31g X 5/16" 1 Ml Misc (Insulin Syringe-Needle U-100) .... As Directed 15)  Diltiazem Hcl Er Beads 300 Mg Xr24h-Cap (Diltiazem Hcl Er Beads) .Marland Kitchen.. 1 Once Daily  Allergies (verified): 1)  ! Avandia (Rosiglitazone Maleate) 2)  Actos (Pioglitazone Hcl)  Past History:  Past Medical History: RENAL INSUFFICIENCY (ICD-588.9) EDEMA (ICD-782.3) ENCOUNTER FOR LONG-TERM USE OF OTHER MEDICATIONS (ICD-V58.69)  COUGH (ICD-786.2) PNEUMONIA (ICD-486) CARPAL TUNNEL SYNDROME, BILATERAL (ICD-354.0) HAND PAIN, LEFT (ICD-729.5) ROUTINE GENERAL MEDICAL EXAM@HEALTH  CARE FACL (ICD-V70.0) CT, CHEST, ABNORMAL (ICD-793.1) MORBID OBESITY (ICD-278.01) ANXIETY (ICD-300.00) HYPERTENSION (ICD-401.9) DIABETES MELLITUS, TYPE II (ICD-250.00) COPD (ICD-496)      - HFA   50%   p coaching October 15, 2009   Family History: Reviewed history from 03/07/2009 and no changes required. asthma - PGF heart disease - mother rheumatism - mother, PGF cancer - mother (pancreatic), father (liver) DM - father stroke -   mother  Social History: Reviewed history from 03/07/2009 and no changes required. former smoker quit 2010 x70yrs 2ppd no alcohol divorced 2 children disabled, did payroll  Vital Signs:  Patient profile:   65 year old female Weight:      273 pounds O2 Sat:      91 % on 4 L/min pulsed Temp:     98.7 degrees F oral Pulse rate:   126 / minute BP sitting:   140 / 68  (left arm) Cuff size:   large  Vitals Entered By: Vernie Murders (October 15, 2009 4:31 PM)  O2 Flow:  4 L/min pulsed  Physical Exam  Additional Exam:  amb obese wf nad wt 273 October 15, 2009 HEENT mild turbinate edema.  Oropharynx no thrush or excess pnd or cobblestoning.  No JVD or cervical adenopathy. Mild accessory muscle hypertrophy. Trachea midline, nl thryroid. Chest was hyperinflated by percussion with diminished breath sounds and moderate increased exp time without wheeze. Hoover sign positive at mid inspiration. Regular rate and rhythm without murmur gallop or rub or increase P2 or edema.  Abd markedly obese: no hsm,  limited excursion. Ext warm without cyanosis or clubbing.     CXR  Procedure date:  10/15/2009  Findings:      Comparison: 02/18/2009 and earlier.   Findings: Stable lung volumes.  Cardiac size and mediastinal contours are within normal limits.  Visualized tracheal air column is within normal limits.  Chronic diffuse increased interstitial opacity may relate to vascular congestion.  No pneumothorax or effusion.  No consolidation or confluent pulmonary opacity No acute osseous abnormality identified.   IMPRESSION: Pulmonary vascular congestion versus chronic pulmonary interstitial changes.  Otherwise no acute findings.    Impression & Recommendations:  Problem # 1:  COPD (ICD-496)   DDX of  difficult airways managment all start with A and  include Adherence, Ace Inhibitors, Acid Reflux, Active Sinus Disease, Alpha 1 Antitripsin deficiency, Anxiety masquerading as Airways dz,  ABPA,   allergy(esp in young), Aspiration (esp in elderly), Adverse effects of DPI,  Active smokers, plus one B  = Beta blocker use..   ? adherence:  I spent extra time with the patient today explaining optimal mdi  technique.  This improved from  25-50% with coaching  ? active smoking - denies x 1 year   Each maintenance medication was reviewed in detail including most importantly the difference between maintenance and as needed and under what circumstances the prns are to be used. See instructions for specific recommendations   Problem # 2:  RESPIRATORY FAILURE, CHRONIC (ICD-518.83)  Well compensated on present rx  Orders: Est. Patient Level IV ([address])  Problem # 3:  COUGH (ICD-786.2) c/w URI/ acute bronchitis  See instructions for specific recommendations   Medications Added to Medication List This Visit: 1)  Relion Insulin Syringe 31g X 5/16" 1 Ml Misc (Insulin syringe-needle u-100) .... As directed 2)  Albuterol Sulfate (2.5 Mg/59ml) 0.083% Nebu (  Albuterol sulfate) .... Four times daily or every 6 hours as needed 3)  Prednisone 10 Mg Tabs (Prednisone) .... Take as directed 4)  Doxycycline Hyclate 100 Mg Caps (Doxycycline hyclate) .... One twice daily with glass of water before eat  Other Orders: T-2 View CXR (71020TC) Prescription Created Electronically [phone]) HFA Instruction [phone])  Patient Instructions: 1)  If is ok to use Albuterol in neb  but only as a back up to your other medications 2)  Work on inhaler technique:  relax and blow all the way out then take a nice smooth deep breath back in, triggering the inhaler at same time you start breathing in  3)  Prednisone x 6 days 4)  Doxy 7 days called in to your drugstore 5)  Pulmonary follow up is as needed Prescriptions: DOXYCYCLINE HYCLATE 100 MG CAPS (DOXYCYCLINE HYCLATE) one twice daily with glass of water before eat  #14 x 0   Entered and Authorized by:   Nyoka Cowden MD   Signed by:   Nyoka Cowden MD on 10/15/2009    Method used:   Electronically to        [facility] (retail)       [address]       [place], [place]  [address]       Ph: [phone]       Fax: [phone]   RxID:   [id number] PREDNISONE 10 MG  TABS (PREDNISONE) Take as directed  #14 x 0   Entered and Authorized by:   Nyoka Cowden MD   Signed by:   Nyoka Cowden MD on 10/15/2009   Method used:   Electronically to        [facility].* (retail)       [address]       [place], [place]  [address]       Ph: [phone]       Fax: [phone]   RxID:   [id number] ALBUTEROL SULFATE (2.5 MG/3ML) 0.083%  NEBU (ALBUTEROL SULFATE) Four times daily or every 6 hours as needed  #25 vials x 5   Entered and Authorized by:   Nyoka Cowden MD   Signed by:   Nyoka Cowden MD on 10/15/2009   Method used:   Electronically to        [facility].* (retail)       [address]       [place], [place]  [address]       Ph: [phone]       Fax: [phone]   RxID:   [id number]

## 2010-02-12 NOTE — Progress Notes (Signed)
  Phone Note Refill Request Message from:  Fax from Pharmacy on May 19, 2009 10:37 AM  Refills Requested: Medication #1:  BENICAR HCT 40-25 MG TABS Take 1 tablet by mouth once a day   Dosage confirmed as above?Dosage Confirmed Initial call taken by: Josph Macho RMA,  May 19, 2009 10:37 AM    Prescriptions: BENICAR HCT 40-25 MG TABS (OLMESARTAN MEDOXOMIL-HCTZ) Take 1 tablet by mouth once a day  #30 x 1   Entered by:   Josph Macho RMA   Authorized by:   Minus Breeding MD   Signed by:   Josph Macho RMA on 05/19/2009   Method used:   Electronically to        [facility] (retail)       [address]       [place], [place]  [address]       Ph: [phone]       Fax: [phone]   RxID:   [id number]

## 2010-02-12 NOTE — Letter (Signed)
Summary: [facility]N/[facility]  CMN/[facility]   Imported By: Lester Salisbury 10/23/2009 08:09:42  _____________________________________________________________________  External Attachment:    Type:   Image     Comment:   External Document

## 2010-02-12 NOTE — Miscellaneous (Signed)
Summary: Appointment Canceled  Appointment status changed to canceled by [employer] on 08/05/2009 12:20 PM.  Cancellation Comments --------------------- stress only/dx:chest pain/wt:273/ins:mcr/dr ellison/jml  Appointment Information ----------------------- Appt Type:  CARDIOLOGY NUCLEAR TESTING      Date:  Tuesday, August 05, 2009      Time:  12:30 PM for 15 min   Urgency:  Routine   Made By:  Pearson Grippe  To Visit:  [id number]    Reason:  stress only/dx:chest pain/wt:273/ins:mcr/dr ellison/jml  Appt Comments ------------- -- 08/05/09 12:20: ([facility]) CANCELED -- stress only/dx:chest pain/wt:273/ins:mcr/dr ellison/jml -- 07/31/09 12:35: ([facility]) BOOKED -- Routine CARDIOLOGY NUCLEAR TESTING at 08/05/2009 12:30 PM for 15 min stress only/dx:chest pain/wt:273/ins:mcr/dr ellison/jm

## 2010-02-12 NOTE — Progress Notes (Signed)
Summary: Genevieve Norlander  Phone Note Outgoing Call   Summary of Call: Faxed completed paperwork to [facility] and sent a copy to be scanned. Initial call taken by: Josph Macho RMA,  March 24, 2009 8:44 AM

## 2010-02-12 NOTE — Progress Notes (Signed)
Summary: Nuc. Pre-Procedure  Phone Note Outgoing Call Call back at [phone]   Call placed by: Irean Hong, RN,  July 30, 2009 2:53 PM Summary of Call: Left message with information on [employer] Information Sheet (see scanned document for details). The patient's home phone number temporarily out of service. At message was left on her son's answer machine.[name].     Nuclear Med Background Indications for Stress Test: Evaluation for Ischemia   History: COPD   Symptoms: Chest Pain    Nuclear Pre-Procedure Cardiac Risk Factors: Hypertension, IDDM Type 2, Lipids, Obesity Height (in): 66

## 2010-02-12 NOTE — Progress Notes (Signed)
Summary: [name]?  Phone Note Other Incoming   Caller: AnnMarie Social Worker w/ [name] [phone] Summary of Call: Social Worker called requesting an additional visit with pt to help with community resources and housing options. verbal ok. Initial call taken by: Margaret Pyle, CMA,  March 12, 2009 10:53 AM  Follow-up for Phone Call        ok Follow-up by: Minus Breeding MD,  March 12, 2009 12:12 PM  Additional Follow-up for Phone Call Additional follow up Details #1::        Annmarie notified Additional Follow-up by: Margaret Pyle, CMA,  March 12, 2009 1:47 PM

## 2010-02-17 ENCOUNTER — Ambulatory Visit: Payer: Self-pay | Admitting: Endocrinology

## 2010-02-20 ENCOUNTER — Ambulatory Visit: Payer: Self-pay | Admitting: Endocrinology

## 2010-02-20 ENCOUNTER — Telehealth: Payer: Self-pay | Admitting: Endocrinology

## 2010-02-26 ENCOUNTER — Ambulatory Visit: Payer: Self-pay | Admitting: Endocrinology

## 2010-02-26 NOTE — Progress Notes (Signed)
  Phone Note Call from Patient Call back at Home Phone [phone]   Caller: Patient---[phone] Call For: Minus Breeding MD Summary of Call: Pt wants Dr Everardo All to know she is going to [facility] ER, she has low back pain,kidney pain. Initial call taken by: Verdell Face,  February 20, 2010 1:53 PM

## 2010-02-27 ENCOUNTER — Encounter: Payer: Self-pay | Admitting: Internal Medicine

## 2010-02-27 ENCOUNTER — Other Ambulatory Visit: Payer: Medicare Other

## 2010-02-27 ENCOUNTER — Encounter (INDEPENDENT_AMBULATORY_CARE_PROVIDER_SITE_OTHER): Payer: Self-pay | Admitting: *Deleted

## 2010-02-27 ENCOUNTER — Ambulatory Visit (INDEPENDENT_AMBULATORY_CARE_PROVIDER_SITE_OTHER): Payer: Medicare Other | Admitting: Internal Medicine

## 2010-02-27 DIAGNOSIS — R300 Dysuria: Secondary | ICD-10-CM

## 2010-02-27 DIAGNOSIS — I10 Essential (primary) hypertension: Secondary | ICD-10-CM

## 2010-02-27 DIAGNOSIS — E119 Type 2 diabetes mellitus without complications: Secondary | ICD-10-CM

## 2010-02-27 DIAGNOSIS — R109 Unspecified abdominal pain: Secondary | ICD-10-CM | POA: Insufficient documentation

## 2010-02-27 LAB — CONVERTED CEMR LAB
Bilirubin Urine: NEGATIVE
Protein, U semiquant: 100
Specific Gravity, Urine: 1.02
WBC Urine, dipstick: NEGATIVE

## 2010-03-04 NOTE — Assessment & Plan Note (Signed)
Summary: UTI? / NWS #   Vital Signs:  Patient profile:   65 year old female Menstrual status:  hysterectomy Height:      66.5 inches Weight:      276.13 pounds BMI:     44.06 O2 Sat:      87 % on Room air Temp:     98.6 degrees F oral Pulse rate:   126 / minute BP sitting:   130 / 62  (left arm) Cuff size:   large  Vitals Entered By: Zella Ball Ewing CMA (AAMA) (February 27, 2010 4:07 PM)  O2 Flow:  Room air CC: Back pain, painful when urinating/RE   Primary Care Provider:  Dr. Romero Belling  CC:  Back pain and painful when urinating/RE.  History of Present Illness: here with acute - c/o onset midl to mod fever, general weakness and malaise, dysuria, urgency, lower abd and  back discomfort and bilat flank pains for 2-3 days;  no chill, n/v, frequency or hematuria.  Last UTI not recent.  No hx of recurrent UTI, MRSA or other complicated.  Pt denies CP, worsening sob, doe, wheezing, orthopnea, pnd, worsening LE edema, palps, dizziness or syncope  Pt denies new neuro symptoms such as headache, facial or extremity weakness  Pt denies polydipsia, polyuria, or low sugar symptoms such as shakiness improved with eating.  Overall good compliance with meds, trying to follow low chol, DM diet, wt stable, little excercise however .  CBG's usually in 100's.  No recent wt loss, night sweats, loss of appetite or other constitutional symptoms   Problems Prior to Update: 1)  Flank Pain  (ICD-789.09) 2)  Dysuria  (ICD-788.1) 3)  Asymptomatic Postmenopausal Status  (ICD-V49.81) 4)  Respiratory Failure, Chronic  (ICD-518.83) 5)  Anemia  (ICD-285.9) 6)  Hypercholesterolemia  (ICD-272.0) 7)  Chest Pain  (ICD-786.50) 8)  Back Pain, Lumbar  (ICD-724.2) 9)  Renal Insufficiency  (ICD-588.9) 10)  Edema  (ICD-782.3) 11)  Encounter For Long-term Use of Other Medications  (ICD-V58.69) 12)  Cough  (ICD-786.2) 13)  Pneumonia  (ICD-486) 14)  Carpal Tunnel Syndrome, Bilateral  (ICD-354.0) 15)  Hand Pain, Left   (ICD-729.5) 16)  Routine General Medical Exam@[facility]  (ICD-V70.0) 17)  Ct, Chest, Abnormal  (ICD-793.1) 18)  Morbid Obesity  (ICD-278.01) 19)  Anxiety  (ICD-300.00) 20)  Hypertension  (ICD-401.9) 21)  Diabetes Mellitus, Type II  (ICD-250.00) 22)  COPD  (ICD-496)  Medications Prior to Update: 1)  Multivitamins   Tabs (Multiple Vitamin) .... Take 1 Tablet By Mouth Once A Day 2)  Adult Aspirin Low Strength 81 Mg  Tbdp (Aspirin) .... Take 1 Tablet By Mouth Once A Day 3)  Benicar Hct 40-25 Mg Tabs (Olmesartan Medoxomil-Hctz) .... Take 1 Tablet By Mouth Once A Day 4)  Furosemide 40 Mg Tabs (Furosemide) .... Take 1 Tablet By Mouth Once A Day 5)  Citalopram Hydrobromide 40 Mg Tabs (Citalopram Hydrobromide) .... Take 1 Tablet By Mouth Once A Day 6)  Spiriva Handihaler 18 Mcg  Caps (Tiotropium Bromide Monohydrate) .... Inhale Contents of 1 Capsule Once A Day 7)  Oxygen 4 Lpm .... 24/7 8)  Symbicort 160-4.5 Mcg/act Aero (Budesonide-Formoterol Fumarate) .... Inhale 2 Puff Using Inhaler Twice A Day 9)  Relion 70/30 70-30 %  Susp (Insulin Isophane & Regular) .Marland Kitchen.. 110 Units Qam, 90 Units Pm 10)  Proair Hfa 108 (90 Base) Mcg/act  Aers (Albuterol Sulfate) .... Inhale 2 Puffs Every 4 To 6  Hours As Needed 11)  Tylenol Extra Strength 500 Mg Tabs (Acetaminophen) .... As Directed 12)  Alprazolam 0.25 Mg Tabs (Alprazolam) .... Take 1 Tablet By Mouth Two Times A Day As Needed For Anxiety. Not To Exceed 2/day 13)  Mucinex Dm 30-600 Mg Xr12h-Tab (Dextromethorphan-Guaifenesin) .... Take 1-2 Tablets Every 12 Hours As Needed 14)  Relion Insulin Syringe 31g X 5/16" 1 Ml Misc (Insulin Syringe-Needle U-100) .... As Directed 15)  Diltiazem Hcl Er Beads 300 Mg Xr24h-Cap (Diltiazem Hcl Er Beads) .Marland Kitchen.. 1 Once Daily 16)  Albuterol Sulfate (2.5 Mg/31ml) 0.083%  Nebu (Albuterol Sulfate) .... Four Times Daily or Every 6 Hours As Needed  Current Medications (verified): 1)  Multivitamins   Tabs (Multiple Vitamin) ....  Take 1 Tablet By Mouth Once A Day 2)  Adult Aspirin Low Strength 81 Mg  Tbdp (Aspirin) .... Take 1 Tablet By Mouth Once A Day 3)  Benicar Hct 40-25 Mg Tabs (Olmesartan Medoxomil-Hctz) .... Take 1 Tablet By Mouth Once A Day 4)  Furosemide 40 Mg Tabs (Furosemide) .... Take 1 Tablet By Mouth Once A Day 5)  Citalopram Hydrobromide 40 Mg Tabs (Citalopram Hydrobromide) .... Take 1 Tablet By Mouth Once A Day 6)  Spiriva Handihaler 18 Mcg  Caps (Tiotropium Bromide Monohydrate) .... Inhale Contents of 1 Capsule Once A Day 7)  Oxygen 4 Lpm .... 24/7 8)  Symbicort 160-4.5 Mcg/act Aero (Budesonide-Formoterol Fumarate) .... Inhale 2 Puff Using Inhaler Twice A Day 9)  Relion 70/30 70-30 %  Susp (Insulin Isophane & Regular) .Marland Kitchen.. 110 Units Qam, 90 Units Pm 10)  Proair Hfa 108 (90 Base) Mcg/act  Aers (Albuterol Sulfate) .... Inhale 2 Puffs Every 4 To 6  Hours As Needed 11)  Tylenol Extra Strength 500 Mg Tabs (Acetaminophen) .... As Directed 12)  Alprazolam 0.25 Mg Tabs (Alprazolam) .... Take 1 Tablet By Mouth Two Times A Day As Needed For Anxiety. Not To Exceed 2/day 13)  Mucinex Dm 30-600 Mg Xr12h-Tab (Dextromethorphan-Guaifenesin) .... Take 1-2 Tablets Every 12 Hours As Needed 14)  Relion Insulin Syringe 31g X 5/16" 1 Ml Misc (Insulin Syringe-Needle U-100) .... As Directed 15)  Diltiazem Hcl Er Beads 300 Mg Xr24h-Cap (Diltiazem Hcl Er Beads) .Marland Kitchen.. 1 Once Daily 16)  Albuterol Sulfate (2.5 Mg/73ml) 0.083%  Nebu (Albuterol Sulfate) .... Four Times Daily or Every 6 Hours As Needed 17)  Septra Ds 800-160 Mg Tabs (Sulfamethoxazole-Trimethoprim) .Marland Kitchen.. 1po Two Times A Day  Allergies (verified): 1)  ! Avandia (Rosiglitazone Maleate) 2)  Actos (Pioglitazone Hcl)  Past History:  Past Medical History: Last updated: 11/18/2009 RENAL INSUFFICIENCY (ICD-588.9) EDEMA (ICD-782.3) ENCOUNTER FOR LONG-TERM USE OF OTHER MEDICATIONS (ICD-V58.69) COUGH (ICD-786.2) PNEUMONIA (ICD-486) CARPAL TUNNEL SYNDROME, BILATERAL  (ICD-354.0) HAND PAIN, LEFT (ICD-729.5) ROUTINE GENERAL MEDICAL EXAM@[facility]ALTH  CARE FACL (ICD-V70.0) CT, CHEST, ABNORMAL (ICD-793.1) MORBID OBESITY (ICD-278.01) ANXIETY (ICD-300.00) HYPERTENSION (ICD-401.9) DIABETES MELLITUS, TYPE II (ICD-250.00) COPD (ICD-496)      - HFA   50%   p coaching October 15, 2009   pulm: dr wert psychology:  dr Luiz Blare gyn: dr Aldona Bar  Social History: Last updated: 11/18/2009 former smoker quit 2010 x61yrs 2ppd no alcohol divorced.  lives with dtr. 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   Eyes:  vision grossly intact, pupils equal, and pupils round.  Ears:  R ear normal and L ear normal.   Nose:  no external deformity and no nasal discharge.   Mouth:  no gingival abnormalities and pharynx pink and moist.   Neck:  supple and no masses.   Lungs:  normal respiratory effort and normal breath sounds.   Heart:  normal rate and regular rhythm.   Abdomen:  soft and normal bowel sounds.  with mild lower abd tender without guarding or rebound Msk:  bilat flank mild tender, no spine tender or paravertebral spasm or tenderness Extremities:  no edema, no erythema    Impression & Recommendations:  Problem # 1:  DYSURIA (ICD-788.1)  Her updated medication list for this problem includes:    Septra Ds 800-160 Mg Tabs (Sulfamethoxazole-trimethoprim) .Marland Kitchen... 1po two times a day  Orders: UA Dipstick W/ Micro (manual) (16109) T-Culture, Urine (60454-09811) prob infectious - UA dip reviewed, for urine cx, treat as above, f/u any worsening signs or symptoms   Problem # 2:  FLANK PAIN (ICD-789.09)  Her updated medication list for this problem includes:    Adult Aspirin Low Strength 81 Mg Tbdp (Aspirin) .Marland Kitchen... Take 1 tablet by mouth once a  day    Tylenol Extra Strength 500 Mg Tabs (Acetaminophen) .Marland Kitchen... As directed ? MSK vs early pyelonephritis; treat as above, f/u any worsening signs or symptoms   Problem # 3:  DIABETES MELLITUS, TYPE II (ICD-250.00)  Her updated medication list for this problem includes:    Adult Aspirin Low Strength 81 Mg Tbdp (Aspirin) .Marland Kitchen... Take 1 tablet by mouth once a day    Benicar Hct 40-25 Mg Tabs (Olmesartan medoxomil-hctz) .Marland Kitchen... Take 1 tablet by mouth once a day    Relion 70/30 70-30 % Susp (Insulin isophane & regular) .Marland Kitchen... 110 units qam, 90 units pm  Labs Reviewed: Creat: 1.4 (10/14/2009)    Reviewed HgBA1c results: 7.9 (10/14/2009)  7.9 (07/24/2009) stable overall by hx and exam, ok to continue meds/tx as is - declines labs today;  call for onset polys or CBG's > 200  Problem # 4:  HYPERTENSION (ICD-401.9)  Her updated medication list for this problem includes:    Benicar Hct 40-25 Mg Tabs (Olmesartan medoxomil-hctz) .Marland Kitchen... Take 1 tablet by mouth once a day    Furosemide 40 Mg Tabs (Furosemide) .Marland Kitchen... Take 1 tablet by mouth once a day    Diltiazem Hcl Er Beads 300 Mg Xr24h-cap (Diltiazem hcl er beads) .Marland Kitchen... 1 once daily  BP today: 130/62 Prior BP: 116/62 (11/18/2009)  Labs Reviewed: K+: 5.1 (10/14/2009) Creat: : 1.4 (10/14/2009)   Chol: 158 (07/24/2009)   HDL: 38.80 (07/24/2009)   LDL: 78 (07/04/2007)   TG: 249.0 (07/24/2009) stable overall by hx and exam, ok to continue meds/tx as is   Complete Medication List: 1)  Multivitamins Tabs (Multiple vitamin) .... Take 1 tablet by mouth once a day 2)  Adult Aspirin Low Strength 81 Mg Tbdp (Aspirin) .... Take 1 tablet by mouth once a day 3)  Benicar Hct 40-25 Mg Tabs (Olmesartan medoxomil-hctz) .... Take 1 tablet by mouth once a day 4)  Furosemide 40 Mg Tabs (Furosemide) .... Take 1 tablet by mouth once a day 5)  Citalopram Hydrobromide 40 Mg Tabs (Citalopram hydrobromide) .... Take 1 tablet by mouth once a day 6)  Spiriva Handihaler 18 Mcg  Caps (Tiotropium bromide monohydrate) .... Inhale contents of 1 capsule once a day 7)  Oxygen 4 Lpm  .... 24/7 8)  Symbicort 160-4.5 Mcg/act Aero (Budesonide-formoterol fumarate) .... Inhale  2 puff using inhaler twice a day 9)  Relion 70/30 70-30 % Susp (Insulin isophane & regular) .Marland Kitchen.. 110 units qam, 90 units pm 10)  Proair Hfa 108 (90 Base) Mcg/act Aers (Albuterol sulfate) .... Inhale 2 puffs every 4 to 6  hours as needed 11)  Tylenol Extra Strength 500 Mg Tabs (Acetaminophen) .... As directed 12)  Alprazolam 0.25 Mg Tabs (Alprazolam) .... Take 1 tablet by mouth two times a day as needed for anxiety. not to exceed 2/day 13)  Mucinex Dm 30-600 Mg Xr12h-tab (Dextromethorphan-guaifenesin) .... Take 1-2 tablets every 12 hours as needed 14)  Relion Insulin Syringe 31g X 5/16" 1 Ml Misc (Insulin syringe-needle u-100) .... As directed 15)  Diltiazem Hcl Er Beads 300 Mg Xr24h-cap (Diltiazem hcl er beads) .Marland Kitchen.. 1 once daily 16)  Albuterol Sulfate (2.5 Mg/68ml) 0.083% Nebu (Albuterol sulfate) .... Four times daily or every 6 hours as needed 17)  Septra Ds 800-160 Mg Tabs (Sulfamethoxazole-trimethoprim) .Marland Kitchen.. 1po two times a day  Patient Instructions: 1)  Please take all new medications as prescribed 2)  Continue all previous medications as before this visit  3)  your urine will be sent for culture 4)  Please call the number on the [facility] Card for results of your testing 5)  Please schedule an appointment with your primary doctor in 2 wks Prescriptions: SEPTRA DS 800-160 MG TABS (SULFAMETHOXAZOLE-TRIMETHOPRIM) 1po two times a day  #20 x 0   Entered and Authorized by:   Corwin Levins MD   Signed by:   Corwin Levins MD on 02/27/2010   Method used:   Electronically to        [facility] (retail)       [address]       [place], [place]  [address]       Ph: [phone]       Fax: [phone]   RxID:   [id number]    Orders Added: 1)  UA Dipstick W/ Micro  (manual) [81000] 2)  T-Culture, Urine [[id number]] 3)  Est. Patient Level IV [38756]    Laboratory Results   Urine Tests    Routine Urinalysis   Color: yellow Appearance: Cloudy Glucose: negative   (Normal Range: Negative) Bilirubin: negative   (Normal Range: Negative) Ketone: negative   (Normal Range: Negative) Spec. Gravity: 1.020   (Normal Range: 1.003-1.035) Blood: large   (Normal Range: Negative) pH: 6.5   (Normal Range: 5.0-8.0) Protein: 100   (Normal Range: Negative) Urobilinogen: 0.2   (Normal Range: 0-1) Nitrite: negative   (Normal Range: Negative) Leukocyte Esterace: negative   (Normal Range: Negative)

## 2010-03-13 ENCOUNTER — Ambulatory Visit: Payer: Medicare Other | Admitting: Endocrinology

## 2010-03-16 ENCOUNTER — Ambulatory Visit: Payer: Medicare Other | Admitting: Endocrinology

## 2010-03-16 DIAGNOSIS — Z0289 Encounter for other administrative examinations: Secondary | ICD-10-CM

## 2010-03-20 ENCOUNTER — Ambulatory Visit: Payer: Medicare Other | Admitting: Endocrinology

## 2010-03-20 DIAGNOSIS — Z0289 Encounter for other administrative examinations: Secondary | ICD-10-CM

## 2010-03-24 ENCOUNTER — Ambulatory Visit: Payer: Medicare Other | Admitting: Endocrinology

## 2010-03-24 DIAGNOSIS — Z0289 Encounter for other administrative examinations: Secondary | ICD-10-CM

## 2010-03-27 ENCOUNTER — Ambulatory Visit: Payer: Medicare Other | Admitting: Endocrinology

## 2010-03-27 DIAGNOSIS — Z0289 Encounter for other administrative examinations: Secondary | ICD-10-CM

## 2010-04-03 LAB — GLUCOSE, CAPILLARY
Glucose-Capillary: 127 mg/dL — ABNORMAL HIGH (ref 70–99)
Glucose-Capillary: 127 mg/dL — ABNORMAL HIGH (ref 70–99)
Glucose-Capillary: 131 mg/dL — ABNORMAL HIGH (ref 70–99)
Glucose-Capillary: 144 mg/dL — ABNORMAL HIGH (ref 70–99)
Glucose-Capillary: 147 mg/dL — ABNORMAL HIGH (ref 70–99)
Glucose-Capillary: 156 mg/dL — ABNORMAL HIGH (ref 70–99)
Glucose-Capillary: 159 mg/dL — ABNORMAL HIGH (ref 70–99)
Glucose-Capillary: 159 mg/dL — ABNORMAL HIGH (ref 70–99)
Glucose-Capillary: 162 mg/dL — ABNORMAL HIGH (ref 70–99)
Glucose-Capillary: 162 mg/dL — ABNORMAL HIGH (ref 70–99)
Glucose-Capillary: 163 mg/dL — ABNORMAL HIGH (ref 70–99)
Glucose-Capillary: 165 mg/dL — ABNORMAL HIGH (ref 70–99)
Glucose-Capillary: 172 mg/dL — ABNORMAL HIGH (ref 70–99)
Glucose-Capillary: 178 mg/dL — ABNORMAL HIGH (ref 70–99)
Glucose-Capillary: 178 mg/dL — ABNORMAL HIGH (ref 70–99)
Glucose-Capillary: 179 mg/dL — ABNORMAL HIGH (ref 70–99)
Glucose-Capillary: 182 mg/dL — ABNORMAL HIGH (ref 70–99)
Glucose-Capillary: 184 mg/dL — ABNORMAL HIGH (ref 70–99)
Glucose-Capillary: 198 mg/dL — ABNORMAL HIGH (ref 70–99)
Glucose-Capillary: 205 mg/dL — ABNORMAL HIGH (ref 70–99)
Glucose-Capillary: 208 mg/dL — ABNORMAL HIGH (ref 70–99)
Glucose-Capillary: 210 mg/dL — ABNORMAL HIGH (ref 70–99)
Glucose-Capillary: 218 mg/dL — ABNORMAL HIGH (ref 70–99)
Glucose-Capillary: 219 mg/dL — ABNORMAL HIGH (ref 70–99)
Glucose-Capillary: 221 mg/dL — ABNORMAL HIGH (ref 70–99)
Glucose-Capillary: 225 mg/dL — ABNORMAL HIGH (ref 70–99)
Glucose-Capillary: 225 mg/dL — ABNORMAL HIGH (ref 70–99)
Glucose-Capillary: 229 mg/dL — ABNORMAL HIGH (ref 70–99)
Glucose-Capillary: 244 mg/dL — ABNORMAL HIGH (ref 70–99)
Glucose-Capillary: 246 mg/dL — ABNORMAL HIGH (ref 70–99)
Glucose-Capillary: 246 mg/dL — ABNORMAL HIGH (ref 70–99)
Glucose-Capillary: 247 mg/dL — ABNORMAL HIGH (ref 70–99)
Glucose-Capillary: 248 mg/dL — ABNORMAL HIGH (ref 70–99)
Glucose-Capillary: 250 mg/dL — ABNORMAL HIGH (ref 70–99)
Glucose-Capillary: 252 mg/dL — ABNORMAL HIGH (ref 70–99)
Glucose-Capillary: 257 mg/dL — ABNORMAL HIGH (ref 70–99)
Glucose-Capillary: 273 mg/dL — ABNORMAL HIGH (ref 70–99)
Glucose-Capillary: 292 mg/dL — ABNORMAL HIGH (ref 70–99)
Glucose-Capillary: 303 mg/dL — ABNORMAL HIGH (ref 70–99)
Glucose-Capillary: 308 mg/dL — ABNORMAL HIGH (ref 70–99)
Glucose-Capillary: 339 mg/dL — ABNORMAL HIGH (ref 70–99)
Glucose-Capillary: 348 mg/dL — ABNORMAL HIGH (ref 70–99)
Glucose-Capillary: 351 mg/dL — ABNORMAL HIGH (ref 70–99)
Glucose-Capillary: 96 mg/dL (ref 70–99)

## 2010-04-03 LAB — CBC
HCT: 33.6 % — ABNORMAL LOW (ref 36.0–46.0)
HCT: 34.6 % — ABNORMAL LOW (ref 36.0–46.0)
HCT: 36.3 % (ref 36.0–46.0)
HCT: 36.6 % (ref 36.0–46.0)
HCT: 37.5 % (ref 36.0–46.0)
HCT: 41.6 % (ref 36.0–46.0)
Hemoglobin: 11.4 g/dL — ABNORMAL LOW (ref 12.0–15.0)
Hemoglobin: 11.7 g/dL — ABNORMAL LOW (ref 12.0–15.0)
Hemoglobin: 11.9 g/dL — ABNORMAL LOW (ref 12.0–15.0)
Hemoglobin: 12 g/dL (ref 12.0–15.0)
Hemoglobin: 12 g/dL (ref 12.0–15.0)
Hemoglobin: 12.5 g/dL (ref 12.0–15.0)
MCHC: 32.9 g/dL (ref 30.0–36.0)
MCHC: 33 g/dL (ref 30.0–36.0)
MCHC: 33.2 g/dL (ref 30.0–36.0)
MCHC: 33.4 g/dL (ref 30.0–36.0)
MCV: 89.9 fL (ref 78.0–100.0)
MCV: 90.3 fL (ref 78.0–100.0)
MCV: 90.9 fL (ref 78.0–100.0)
MCV: 91.5 fL (ref 78.0–100.0)
MCV: 91.8 fL (ref 78.0–100.0)
MCV: 92.9 fL (ref 78.0–100.0)
Platelets: 219 10*3/uL (ref 150–400)
Platelets: 230 K/uL (ref 150–400)
Platelets: 247 10*3/uL (ref 150–400)
Platelets: 250 10*3/uL (ref 150–400)
RBC: 3.67 MIL/uL — ABNORMAL LOW (ref 3.87–5.11)
RBC: 3.74 MIL/uL — ABNORMAL LOW (ref 3.87–5.11)
RBC: 3.92 MIL/uL (ref 3.87–5.11)
RBC: 3.99 MIL/uL (ref 3.87–5.11)
RBC: 4.02 MIL/uL (ref 3.87–5.11)
RBC: 4.17 MIL/uL (ref 3.87–5.11)
RDW: 16.9 % — ABNORMAL HIGH (ref 11.5–15.5)
RDW: 17.2 % — ABNORMAL HIGH (ref 11.5–15.5)
RDW: 17.4 % — ABNORMAL HIGH (ref 11.5–15.5)
RDW: 17.6 % — ABNORMAL HIGH (ref 11.5–15.5)
WBC: 10.1 10*3/uL (ref 4.0–10.5)
WBC: 10.1 K/uL (ref 4.0–10.5)
WBC: 10.5 10*3/uL (ref 4.0–10.5)
WBC: 10.8 10*3/uL — ABNORMAL HIGH (ref 4.0–10.5)
WBC: 8.8 10*3/uL (ref 4.0–10.5)
WBC: 9 10*3/uL (ref 4.0–10.5)

## 2010-04-03 LAB — POCT I-STAT 3, ART BLOOD GAS (G3+)
Acid-Base Excess: 11 mmol/L — ABNORMAL HIGH (ref 0.0–2.0)
Acid-Base Excess: 11 mmol/L — ABNORMAL HIGH (ref 0.0–2.0)
Acid-Base Excess: 2 mmol/L (ref 0.0–2.0)
Bicarbonate: 29.7 mEq/L — ABNORMAL HIGH (ref 20.0–24.0)
Bicarbonate: 32.1 mEq/L — ABNORMAL HIGH (ref 20.0–24.0)
Bicarbonate: 32.8 mEq/L — ABNORMAL HIGH (ref 20.0–24.0)
Bicarbonate: 34.3 mEq/L — ABNORMAL HIGH (ref 20.0–24.0)
Bicarbonate: 36.7 mEq/L — ABNORMAL HIGH (ref 20.0–24.0)
Bicarbonate: 38.1 mEq/L — ABNORMAL HIGH (ref 20.0–24.0)
O2 Saturation: 100 %
O2 Saturation: 90 %
O2 Saturation: 92 %
O2 Saturation: 96 %
Patient temperature: 36.7
Patient temperature: 37
Patient temperature: 37.6
Patient temperature: 98.2
TCO2: 34 mmol/L (ref 0–100)
TCO2: 35 mmol/L (ref 0–100)
TCO2: 35 mmol/L (ref 0–100)
TCO2: 37 mmol/L (ref 0–100)
TCO2: 38 mmol/L (ref 0–100)
TCO2: 40 mmol/L (ref 0–100)
pCO2 arterial: 62.2 mmHg (ref 35.0–45.0)
pCO2 arterial: 77.1 mmHg (ref 35.0–45.0)
pCO2 arterial: 79.1 mmHg (ref 35.0–45.0)
pCO2 arterial: 85.1 mmHg (ref 35.0–45.0)
pH, Arterial: 7.214 — ABNORMAL LOW (ref 7.350–7.400)
pH, Arterial: 7.214 — ABNORMAL LOW (ref 7.350–7.400)
pH, Arterial: 7.228 — ABNORMAL LOW (ref 7.350–7.400)
pH, Arterial: 7.231 — ABNORMAL LOW (ref 7.350–7.400)
pH, Arterial: 7.318 — ABNORMAL LOW (ref 7.350–7.400)
pH, Arterial: 7.355 (ref 7.350–7.400)
pO2, Arterial: 65 mmHg — ABNORMAL LOW (ref 80.0–100.0)
pO2, Arterial: 72 mmHg — ABNORMAL LOW (ref 80.0–100.0)

## 2010-04-03 LAB — CARDIAC PANEL(CRET KIN+CKTOT+MB+TROPI)
CK, MB: 8.9 ng/mL (ref 0.3–4.0)
Relative Index: 3.9 — ABNORMAL HIGH (ref 0.0–2.5)
Relative Index: 4.9 — ABNORMAL HIGH (ref 0.0–2.5)
Total CK: 231 U/L — ABNORMAL HIGH (ref 7–177)
Troponin I: 0.08 ng/mL — ABNORMAL HIGH (ref 0.00–0.06)
Troponin I: 0.08 ng/mL — ABNORMAL HIGH (ref 0.00–0.06)

## 2010-04-03 LAB — BASIC METABOLIC PANEL WITH GFR
BUN: 30 mg/dL — ABNORMAL HIGH (ref 6–23)
CO2: 38 meq/L — ABNORMAL HIGH (ref 19–32)
Calcium: 8.5 mg/dL (ref 8.4–10.5)
Chloride: 94 meq/L — ABNORMAL LOW (ref 96–112)
Creatinine, Ser: 0.97 mg/dL (ref 0.4–1.2)
Creatinine, Ser: 0.99 mg/dL (ref 0.4–1.2)
GFR calc Af Amer: >60 mL/min (ref >60)
GFR calc Af Amer: >60 mL/min (ref >60)
Potassium: 4 meq/L (ref 3.5–5.1)

## 2010-04-03 LAB — CULTURE, RESPIRATORY W GRAM STAIN

## 2010-04-03 LAB — COMPREHENSIVE METABOLIC PANEL
ALT: 69 U/L — ABNORMAL HIGH (ref 0–35)
AST: 29 U/L (ref 0–37)
Alkaline Phosphatase: 61 U/L (ref 39–117)
CO2: 32 mEq/L (ref 19–32)
Calcium: 8.8 mg/dL (ref 8.4–10.5)
Chloride: 101 mEq/L (ref 96–112)
GFR calc Af Amer: >60 mL/min (ref >60)
GFR calc non Af Amer: 51 mL/min — ABNORMAL LOW (ref >60)
Glucose, Bld: 321 mg/dL — ABNORMAL HIGH (ref 70–99)
Sodium: 140 mEq/L (ref 135–145)
Total Bilirubin: 0.4 mg/dL (ref 0.3–1.2)

## 2010-04-03 LAB — URINALYSIS, ROUTINE W REFLEX MICROSCOPIC
Specific Gravity, Urine: 1.024 (ref 1.005–1.030)
Urobilinogen, UA: 1 mg/dL (ref 0.0–1.0)
pH: 5 (ref 5.0–8.0)

## 2010-04-03 LAB — STREP PNEUMONIAE URINARY ANTIGEN: Strep Pneumo Urinary Antigen: NEGATIVE

## 2010-04-03 LAB — PHOSPHORUS
Phosphorus: 3.8 mg/dL (ref 2.3–4.6)
Phosphorus: 4.2 mg/dL (ref 2.3–4.6)
Phosphorus: 5.4 mg/dL — ABNORMAL HIGH (ref 2.3–4.6)

## 2010-04-03 LAB — BASIC METABOLIC PANEL
BUN: 32 mg/dL — ABNORMAL HIGH (ref 6–23)
BUN: 38 mg/dL — ABNORMAL HIGH (ref 6–23)
BUN: 44 mg/dL — ABNORMAL HIGH (ref 6–23)
BUN: 48 mg/dL — ABNORMAL HIGH (ref 6–23)
CO2: 29 mEq/L (ref 19–32)
CO2: 34 mEq/L — ABNORMAL HIGH (ref 19–32)
CO2: 35 mEq/L — ABNORMAL HIGH (ref 19–32)
CO2: 36 mEq/L — ABNORMAL HIGH (ref 19–32)
CO2: 37 mEq/L — ABNORMAL HIGH (ref 19–32)
Calcium: 8.2 mg/dL — ABNORMAL LOW (ref 8.4–10.5)
Calcium: 8.4 mg/dL (ref 8.4–10.5)
Calcium: 8.4 mg/dL (ref 8.4–10.5)
Calcium: 8.5 mg/dL (ref 8.4–10.5)
Calcium: 8.6 mg/dL (ref 8.4–10.5)
Chloride: 103 mEq/L (ref 96–112)
Chloride: 107 mEq/L (ref 96–112)
Chloride: 92 mEq/L — ABNORMAL LOW (ref 96–112)
Chloride: 95 mEq/L — ABNORMAL LOW (ref 96–112)
Chloride: 96 mEq/L (ref 96–112)
Chloride: 96 mEq/L (ref 96–112)
Chloride: 99 mEq/L (ref 96–112)
Creatinine, Ser: 1.04 mg/dL (ref 0.4–1.2)
Creatinine, Ser: 1.18 mg/dL (ref 0.4–1.2)
Creatinine, Ser: 1.27 mg/dL — ABNORMAL HIGH (ref 0.4–1.2)
Creatinine, Ser: 1.28 mg/dL — ABNORMAL HIGH (ref 0.4–1.2)
Creatinine, Ser: 1.77 mg/dL — ABNORMAL HIGH (ref 0.4–1.2)
GFR calc Af Amer: 39 mL/min — ABNORMAL LOW (ref >60)
GFR calc Af Amer: 51 mL/min — ABNORMAL LOW (ref >60)
GFR calc Af Amer: 51 mL/min — ABNORMAL LOW (ref >60)
GFR calc Af Amer: >60 mL/min (ref >60)
GFR calc Af Amer: >60 mL/min (ref >60)
GFR calc non Af Amer: 29 mL/min — ABNORMAL LOW (ref >60)
GFR calc non Af Amer: 42 mL/min — ABNORMAL LOW (ref >60)
GFR calc non Af Amer: 54 mL/min — ABNORMAL LOW (ref >60)
GFR calc non Af Amer: 55 mL/min — ABNORMAL LOW (ref >60)
GFR calc non Af Amer: 57 mL/min — ABNORMAL LOW (ref >60)
GFR calc non Af Amer: 58 mL/min — ABNORMAL LOW (ref >60)
Glucose, Bld: 120 mg/dL — ABNORMAL HIGH (ref 70–99)
Glucose, Bld: 133 mg/dL — ABNORMAL HIGH (ref 70–99)
Glucose, Bld: 139 mg/dL — ABNORMAL HIGH (ref 70–99)
Glucose, Bld: 142 mg/dL — ABNORMAL HIGH (ref 70–99)
Glucose, Bld: 180 mg/dL — ABNORMAL HIGH (ref 70–99)
Glucose, Bld: 299 mg/dL — ABNORMAL HIGH (ref 70–99)
Potassium: 4.9 mEq/L (ref 3.5–5.1)
Potassium: 4.9 mEq/L (ref 3.5–5.1)
Potassium: 5 mEq/L (ref 3.5–5.1)
Potassium: 5.4 mEq/L — ABNORMAL HIGH (ref 3.5–5.1)
Potassium: 5.4 mEq/L — ABNORMAL HIGH (ref 3.5–5.1)
Potassium: 5.4 mEq/L — ABNORMAL HIGH (ref 3.5–5.1)
Sodium: 134 mEq/L — ABNORMAL LOW (ref 135–145)
Sodium: 138 mEq/L (ref 135–145)
Sodium: 138 mEq/L (ref 135–145)
Sodium: 138 mEq/L (ref 135–145)
Sodium: 140 mEq/L (ref 135–145)

## 2010-04-03 LAB — DIFFERENTIAL
Basophils Absolute: 0 10*3/uL (ref 0.0–0.1)
Basophils Relative: 0 % (ref 0–1)
Eosinophils Relative: 0 % (ref 0–5)
Lymphocytes Relative: 6 % — ABNORMAL LOW (ref 12–46)
Neutro Abs: 12.3 10*3/uL — ABNORMAL HIGH (ref 1.7–7.7)

## 2010-04-03 LAB — CK TOTAL AND CKMB (NOT AT ARMC): Total CK: 210 U/L — ABNORMAL HIGH (ref 7–177)

## 2010-04-03 LAB — CREATININE, URINE, RANDOM: Creatinine, Urine: 141.8 mg/dL

## 2010-04-03 LAB — POCT I-STAT, CHEM 8
BUN: 51 mg/dL — ABNORMAL HIGH (ref 6–23)
HCT: 47 % — ABNORMAL HIGH (ref 36.0–46.0)
Sodium: 135 mEq/L (ref 135–145)
TCO2: 35 mmol/L (ref 0–100)

## 2010-04-03 LAB — CULTURE, BLOOD (ROUTINE X 2)

## 2010-04-03 LAB — APTT: aPTT: 25 seconds (ref 24–37)

## 2010-04-03 LAB — TRIGLYCERIDES
Triglycerides: 294 mg/dL — ABNORMAL HIGH (ref <150)
Triglycerides: 314 mg/dL — ABNORMAL HIGH (ref <150)

## 2010-04-03 LAB — URINE MICROSCOPIC-ADD ON

## 2010-04-03 LAB — PROTIME-INR: Prothrombin Time: 14 seconds (ref 11.6–15.2)

## 2010-04-03 LAB — POCT CARDIAC MARKERS: Myoglobin, poc: >500 ng/mL (ref 12–200)

## 2010-04-03 LAB — HEPATIC FUNCTION PANEL
ALT: 97 U/L — ABNORMAL HIGH (ref 0–35)
Alkaline Phosphatase: 81 U/L (ref 39–117)
Bilirubin, Direct: 0.2 mg/dL (ref 0.0–0.3)
Indirect Bilirubin: 0 mg/dL — ABNORMAL LOW (ref 0.3–0.9)
Total Bilirubin: 0.2 mg/dL — ABNORMAL LOW (ref 0.3–1.2)

## 2010-04-03 LAB — SODIUM, URINE, RANDOM: Sodium, Ur: 24 mEq/L

## 2010-04-03 LAB — MAGNESIUM
Magnesium: 1.9 mg/dL (ref 1.5–2.5)
Magnesium: 2.3 mg/dL (ref 1.5–2.5)
Magnesium: 2.3 mg/dL (ref 1.5–2.5)
Magnesium: 2.4 mg/dL (ref 1.5–2.5)

## 2010-04-03 LAB — CHOLESTEROL, TOTAL: Cholesterol: 137 mg/dL (ref 0–200)

## 2010-04-03 LAB — LEGIONELLA ANTIGEN, URINE: Legionella Antigen, Urine: NEGATIVE

## 2010-04-03 LAB — BRAIN NATRIURETIC PEPTIDE: Pro B Natriuretic peptide (BNP): 228 pg/mL — ABNORMAL HIGH (ref 0.0–100.0)

## 2010-04-04 ENCOUNTER — Other Ambulatory Visit: Payer: Self-pay | Admitting: Endocrinology

## 2010-04-06 ENCOUNTER — Other Ambulatory Visit: Payer: Self-pay | Admitting: Endocrinology

## 2010-04-10 ENCOUNTER — Encounter: Payer: Self-pay | Admitting: Endocrinology

## 2010-04-10 ENCOUNTER — Ambulatory Visit (INDEPENDENT_AMBULATORY_CARE_PROVIDER_SITE_OTHER): Payer: Medicare Other | Admitting: Endocrinology

## 2010-04-10 DIAGNOSIS — E119 Type 2 diabetes mellitus without complications: Secondary | ICD-10-CM

## 2010-04-10 DIAGNOSIS — N259 Disorder resulting from impaired renal tubular function, unspecified: Secondary | ICD-10-CM

## 2010-04-10 DIAGNOSIS — D509 Iron deficiency anemia, unspecified: Secondary | ICD-10-CM

## 2010-04-10 NOTE — Progress Notes (Signed)
  Subjective:    Patient ID: [name], female    DOB: [birth year], 65 y.o.   MRN: [id number]  HPI The state of at least three ongoing medical problems is addressed today: Dm: no cbg record, but states cbg's are consistently in the 200's.  She says it is higher in am than later in the day.  No hypoglycemic sxs. Anemia:  Denies rectal bleeding. Renal insuff:  She reports fatigue. Past Medical History  Diagnosis Date  . RENAL INSUFFICIENCY 10/15/2008  . Edema 06/18/2008  . PNEUMONIA 12/01/2007  . CARPAL TUNNEL SYNDROME, BILATERAL 10/30/2007  . HAND PAIN, LEFT 09/04/2007  . CT, CHEST, ABNORMAL 05/11/2007  . Morbid obesity 11/08/2006  . ANXIETY 11/08/2006  . HYPERTENSION 11/08/2006  . DIABETES MELLITUS, TYPE II 11/08/2006  . HYPERCHOLESTEROLEMIA 07/24/2009  . ANEMIA 07/24/2009  . COPD 11/08/2006    -HFA 50% p coaching 10/15/2009  . RESPIRATORY FAILURE, CHRONIC 10/15/2009  . BACK PAIN, LUMBAR 03/07/2009  . CHEST PAIN 07/24/2009   No past surgical history on file.  reports that she quit smoking about 2 years ago. She does not have any smokeless tobacco history on file. She reports that she does not drink alcohol or use illicit drugs. family history includes Asthma in her paternal grandfather; Cancer in her father and mother; Diabetes in her father; Heart disease in her mother; and Stroke in her mother. Allergies  Allergen Reactions  . Pioglitazone     REACTION: edema  . Rosiglitazone Maleate       Review of Systems Denies  and hematuria.    Objective:   Physical Exam GENERAL: no distress.  Obese.  Has 02 on Pulses: dorsalis pedis intact bilat.   Feet: no deformity.  no ulcer on the feet.  feet are of normal color and temp.  There is trace bilat edema.  There is blat onychomycosis Neuro: sensation is intact to touch on the feet, but decreased from normal.        Assessment & Plan:  Dm, apparently needs increased rx Anemia, ? improved Renal insufficiency, ? improved

## 2010-04-10 NOTE — Patient Instructions (Addendum)
blood tests are being ordered for you today.  please call [phone] to hear your test results. pending the test results, please increase insulin to 110 units 2x a day (with the first and last meals of the day). It is very important to have a colonoscopy, as it can prevent you from dying of cancer.  Please call if you decide to get this done.  Please return here in 1 month. check your blood sugar 2 times a day.  vary the time of day when you check, between before the 3 meals, and at bedtime.  also check if you have symptoms of your blood sugar being too high or too low.  please keep a record of the readings and bring it to your next appointment here.  please call us sooner if you are having low blood sugar episodes.

## 2010-04-13 ENCOUNTER — Other Ambulatory Visit (INDEPENDENT_AMBULATORY_CARE_PROVIDER_SITE_OTHER): Payer: Medicare Other

## 2010-04-13 ENCOUNTER — Telehealth: Payer: Self-pay | Admitting: Endocrinology

## 2010-04-13 ENCOUNTER — Other Ambulatory Visit: Payer: Self-pay | Admitting: Endocrinology

## 2010-04-13 DIAGNOSIS — D509 Iron deficiency anemia, unspecified: Secondary | ICD-10-CM

## 2010-04-13 DIAGNOSIS — N259 Disorder resulting from impaired renal tubular function, unspecified: Secondary | ICD-10-CM

## 2010-04-13 DIAGNOSIS — E119 Type 2 diabetes mellitus without complications: Secondary | ICD-10-CM

## 2010-04-13 LAB — IBC PANEL
Iron: 37 ug/dL — ABNORMAL LOW (ref 42–145)
Transferrin: 260.2 mg/dL (ref 212.0–360.0)

## 2010-04-13 LAB — CBC WITH DIFFERENTIAL/PLATELET
Basophils Absolute: 0 10*3/uL (ref 0.0–0.1)
Basophils Relative: 0.2 % (ref 0.0–3.0)
Eosinophils Absolute: 0.3 10*3/uL (ref 0.0–0.7)
Hemoglobin: 11 g/dL — ABNORMAL LOW (ref 12.0–15.0)
Lymphocytes Relative: 27.5 % (ref 12.0–46.0)
Monocytes Relative: 5.9 % (ref 3.0–12.0)
Neutro Abs: 5.7 10*3/uL (ref 1.4–7.7)
Neutrophils Relative %: 63.6 % (ref 43.0–77.0)
RBC: 3.82 Mil/uL — ABNORMAL LOW (ref 3.87–5.11)

## 2010-04-13 LAB — HEMOGLOBIN A1C: Hgb A1c MFr Bld: 7.2 % — ABNORMAL HIGH (ref 4.6–6.5)

## 2010-04-13 LAB — BASIC METABOLIC PANEL
BUN: 31 mg/dL — ABNORMAL HIGH (ref 6–23)
CO2: 31 mEq/L (ref 19–32)
Chloride: 94 mEq/L — ABNORMAL LOW (ref 96–112)
Creatinine, Ser: 1.4 mg/dL — ABNORMAL HIGH (ref 0.4–1.2)
Potassium: 4.5 mEq/L (ref 3.5–5.1)

## 2010-04-13 NOTE — Telephone Encounter (Signed)
i left message on phone [facility]

## 2010-04-14 ENCOUNTER — Telehealth: Payer: Self-pay | Admitting: Endocrinology

## 2010-04-14 LAB — PTH, INTACT AND CALCIUM
Calcium, Total (PTH): 8.3 mg/dL — ABNORMAL LOW (ref 8.4–10.5)
PTH: 112.2 pg/mL — ABNORMAL HIGH (ref 14.0–72.0)

## 2010-04-14 MED ORDER — CALCITRIOL 0.25 MCG PO CAPS: 0.2500 ug | ORAL_CAPSULE | Freq: Every day | ORAL | Status: DC

## 2010-04-14 NOTE — Telephone Encounter (Signed)
please call patient: Calcium was low.  i have sent a prescription for a special generic prescription type of vitamin-d.  Ret as scheduled.

## 2010-04-15 ENCOUNTER — Other Ambulatory Visit: Payer: Self-pay | Admitting: Endocrinology

## 2010-04-15 NOTE — Telephone Encounter (Signed)
Pt unavailable, left message for pt to callback office.

## 2010-04-16 NOTE — Telephone Encounter (Signed)
Left message for pt to callback office on home # Unable to reach on mobile # (line busy after several attempts to connect).

## 2010-04-16 NOTE — Telephone Encounter (Signed)
Left message for pt to callback office.  

## 2010-04-16 NOTE — Telephone Encounter (Signed)
Pt informed of MD's advisement and verbalized understanding.

## 2010-04-24 ENCOUNTER — Other Ambulatory Visit: Payer: Self-pay | Admitting: Endocrinology

## 2010-05-08 ENCOUNTER — Ambulatory Visit: Payer: Medicare Other | Admitting: Endocrinology

## 2010-05-08 DIAGNOSIS — Z0289 Encounter for other administrative examinations: Secondary | ICD-10-CM

## 2010-05-11 ENCOUNTER — Ambulatory Visit: Payer: Medicare Other | Admitting: Internal Medicine

## 2010-05-11 ENCOUNTER — Telehealth: Payer: Self-pay | Admitting: Internal Medicine

## 2010-05-11 NOTE — Telephone Encounter (Signed)
No charge. 

## 2010-05-21 ENCOUNTER — Other Ambulatory Visit: Payer: Self-pay | Admitting: Endocrinology

## 2010-05-21 ENCOUNTER — Other Ambulatory Visit: Payer: Self-pay | Admitting: Internal Medicine

## 2010-05-21 NOTE — Telephone Encounter (Signed)
Rx faxed to pharmacy  

## 2010-05-21 NOTE — Telephone Encounter (Signed)
i printed Ov is due 

## 2010-05-22 ENCOUNTER — Ambulatory Visit (INDEPENDENT_AMBULATORY_CARE_PROVIDER_SITE_OTHER): Payer: Medicare Other | Admitting: Endocrinology

## 2010-05-22 ENCOUNTER — Encounter: Payer: Self-pay | Admitting: Endocrinology

## 2010-05-22 VITALS — BP 136/72 | HR 125 | Temp 98.5°F | Ht 66.5 in | Wt 273.2 lb

## 2010-05-22 DIAGNOSIS — E119 Type 2 diabetes mellitus without complications: Secondary | ICD-10-CM

## 2010-05-22 MED ORDER — DOXYCYCLINE HYCLATE 100 MG PO CAPS: 100.0000 mg | ORAL_CAPSULE | Freq: Two times a day (BID) | ORAL | Status: DC

## 2010-05-22 NOTE — Patient Instructions (Addendum)
check your blood sugar 2 times a day.  vary the time of day when you check, between before the 3 meals, and at bedtime.  also check if you have symptoms of your blood sugar being too high or too low.  please keep a record of the readings and bring it to your next appointment here.  please call us sooner if you are having low blood sugar episodes.   Given that your last a1c was pretty good (7.2), please continue the same insulin. Please keep appointment for the colonoscopy. i have sent a prescription to your pharmacy for an antibiotic.   Continue other medications, including inhaler.  Please make a follow-up appointment in 2 weeks.

## 2010-05-22 NOTE — Progress Notes (Signed)
Subjective:    Patient ID: [name], female    DOB: 03-Sep-1945, 65 y.o.   MRN: [id number]  HPI no cbg record, but states cbg's are still 80-265.  It is lowest at hs, and highest before lunch.  She says she does not miss the insulin.   She feel no better since on the rocaltrol.  She still has intermittent muscle cramps.  Pt states few weeks of prod-quality cough, and assoc wheezing.   Past Medical History  Diagnosis Date  . RENAL INSUFFICIENCY 10/15/2008  . Edema 06/18/2008  . PNEUMONIA 12/01/2007  . CARPAL TUNNEL SYNDROME, BILATERAL 10/30/2007  . HAND PAIN, LEFT 09/04/2007  . Morbid obesity   . ANXIETY 11/08/2006  . HYPERTENSION 11/08/2006  . DIABETES MELLITUS, TYPE II 11/08/2006  . HYPERCHOLESTEROLEMIA 07/24/2009  . ANEMIA 07/24/2009  . COPD 11/08/2006    -HFA 50% p coaching 10/15/2009  . RESPIRATORY FAILURE, CHRONIC 10/15/2009  . BACK PAIN, LUMBAR 03/07/2009  . CHEST PAIN 07/24/2009    No past surgical history on file.  History   Social History  . Marital Status: Widowed    Spouse Name: N/A    Number of Children: 2  . Years of Education: N/A   Occupational History  . 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 Smoking status: Former Smoker -- 2.0 packs/day for 40 years    Quit date: 01/12/2008  . Smokeless tobacco: Not on file  . Alcohol Use: No  . Drug Use: No  . Sexually Active:    Other Topics Concern  . Not on file   Social History Narrative   Divorced. Lives with dtr.    Current Outpatient Prescriptions on File Prior to Visit  Medication Sig Dispense Refill  . acetaminophen (TYLENOL) 500 MG tablet As directed       . albuterol (PROVENTIL) (2.5 MG/3ML) 0.083% nebulizer solution Take 2.5 mg by nebulization every 6 (six) hours as needed.        . ALPRAZolam (XANAX) 0.25 MG tablet TAKE ONE TABLET BY MOUTH TWICE DAILY AS NEEDED FOR ANXIETY-NOT TO EXCEED 2 PER DAY  60 tablet  0  . aspirin 81 MG tablet Take 81 mg by mouth daily.          . budesonide-formoterol (SYMBICORT) 160-4.5 MCG/ACT inhaler Inhale 2 puffs into the lungs 2 (two) times daily.        . calcitRIOL (ROCALTROL) 0.25 MCG capsule Take 1 capsule (0.25 mcg total) by mouth daily.  30 capsule  11  . citalopram (CELEXA) 40 MG tablet TAKE ONE TABLET BY MOUTH EVERY DAY  30 tablet  3  . dextromethorphan-guaiFENesin (MUCINEX DM) 30-600 MG per 12 hr tablet 1-2 tablets by mouth every 12 hours as needed       . furosemide (LASIX) 40 MG tablet Take 40 mg by mouth daily.        . insulin NPH-insulin regular (RELION 70/30) (70-30) 100 UNIT/ML injection Inject 110 Units into the skin 2 (two) times daily with a meal.       . Insulin Syringe-Needle U-100 (RELION INSULIN SYRINGE 1ML/31G) 31G X 5/16" 1 ML MISC As directed       . Multiple Vitamin (MULTIVITAMIN) tablet Take 1 tablet by mouth daily.        . NON FORMULARY Oxygen 4LPM  24/7       . olmesartan-hydrochlorothiazide (BENICAR HCT) 40-25 MG per tablet Take 1 tablet by mouth daily.        Marland Kitchen  PROAIR HFA 108 (90 BASE) MCG/ACT inhaler INHALE TWO PUFFS BY MOUTH EVERY 4 TO 6 HOURS AS NEEDED  9 g  6  . tiotropium (SPIRIVA) 18 MCG inhalation capsule Place 18 mcg into inhaler and inhale daily.        Marland Kitchen diltiazem (CARDIZEM CD) 300 MG 24 hr capsule Take 300 mg by mouth daily.          Allergies  Allergen Reactions  . Pioglitazone     REACTION: edema  . Rosiglitazone Maleate     Family History  Problem Relation Age of Onset  . Heart disease Mother   . Pancreatic cancer Mother   . Stroke Mother   . Asthma Paternal Grandfather   . Liver cancer Father   . Diabetes Father     BP 136/72  Pulse 125  Temp(Src) 98.5 F (36.9 C) (Oral)  Ht 5' 6.5" (1.689 m)  Wt 273 lb 3.2 oz (123.923 kg)  BMI 43.44 kg/m2  SpO2 84%    Review of Systems denies hypoglycemia and muscle weakness.    Objective:   Physical Exam GENERAL: no distress LUNGS:  Clear to auscultation    Lab Results  Component Value Date   HGBA1C 7.2*  04/13/2010     Assessment & Plan:  Dm.  this is the best control this pt should aim for, given this regimen, which does match insulin to her changing needs throughout the day Acute bronchitis, recurrent. Secondary hyperparathyroidism.  Clinically improved on rocaltrol.

## 2010-05-26 NOTE — Discharge Summary (Signed)
[name], [name]                 ACCOUNT NO.:  [id number]   MEDICAL RECORD NO.:  [id number]          PATIENT TYPE:  INP   LOCATION:  1404                         FACILITY:  [facility]   PHYSICIAN:  Corwin Levins, MD      DATE OF BIRTH:  14-Dec-1945   DATE OF ADMISSION:  04/16/2007  DATE OF DISCHARGE:  04/21/2007                               DISCHARGE SUMMARY   DISCHARGE DIAGNOSES:  1. Acute bronchitis with bronchospasm.  2. Acute on chronic respiratory failure.  3. Chronic obstructive pulmonary disease.  4. Increasing size of right lung nodule.  5. Diabetes mellitus, severe, uncontrolled.  6. Hypertension.  7. Transient hypokalemia.  8. Transient hyponatremia.  9. Transient hypocalcemia.  10.Morbid obesity.  11.Depression.  12.Chronic steroid use.   CONSULTATIONS:  Pulmonary.   PROCEDURE:  CT angio of the chest without pulmonary embolus, but with  increasing size of right upper lobe spiculated nodule, April 17, 2007.   HISTORY/PHYSICAL:  See the dictated date of admission.   HOSPITAL COURSE:  Ms. Corvera is a 65 year old white female with severe  COPD on chronic home O2 4 liters and low-dose prednisone with recent  severe uncontrolled diabetes on new insulin for the past 4 weeks, who  presented on the date of admission with signs and symptoms of cough and  shortness of breath.  Evaluation was consistent with acute bronchitis  and severe bronchospasm for which she was treated in the usual fashion  with O2 nebulizer treatments, IV antibiotics and IV steroids.  She  underwent CT angio of the chest with the results as above.  There were  also noted upper lobe opacities consistent with acute infectious versus  inflammatory disease on CT angio.  She did fairly well, but with slow  course over the next 5 days of hospitalization with treatment as above.  She was eventually able to ambulate with a sense of dyspnea back to near  baseline.  Her course was complicated by marked elevated  sugars  requiring frequent sliding-scale insulin use.  There was also transient  hypokalemia, hyponatremia, hypocalcemia--all replaced.  Blood pressure  remained relatively stable through the hospitalization.  She was  afforded DVT prophylaxis as well.  At the time of discharge, she is 95%  on 4 liters of O2 which she is on at home, afebrile, vital signs stable,  ambulatory, eating well.  It is noted on discharge that her hemoglobin  A1c near the time of admission was 13.1%.  It is felt at this point, she  has gained maximum benefit from this hospitalization and is to be  discharged home.   DISPOSITION:  Discharged to home in good-fair condition.   DISCHARGE INSTRUCTIONS:  There are no activity or dietary restrictions  except for diabetic diet and increase activity slowly.  Her diabetic  regimen will be intensified to the degree that she will check her CBG 4  times daily a.c. and q.h.s.  Follow up with Dr. Everardo All in 1-2 weeks  with her readings.  Insulin 70/30 is also increased as below.   DISCHARGE MEDICATIONS:  1. Multivitamin 1  p.o. daily.  2. Aspirin 81 mg 1 p.o. daily.  3. Clonidine 0.2 mg t.i.d.  4. Benicar/HCT 40/25 p.o. daily.  5. Lasix 40 mg p.o. daily.  6. Spiriva 1 puff daily.  7. Citalopram 40 mg p.o. daily.  8. Prednisone slow taper with 10 mg tablet 6 per day for 4 days; then      4 per day for 4 days; then 3 per day for 4 days; then 2 per day for      4 days; then 1 per day for 4 days with her baseline 5 mg prednisone      to be taken after that.  9. Symbicort 160/4.5 2 puffs b.i.d.  10.Insulin 70/30 30 units subcu. q.a.m. and 10 units subcu. q.p.m.  11.ProAir HFA 2 puffs q.i.d. p.r.n.  12.Home O2 4 liters.  13.Avelox 400 mg p.o. daily.   FOLLOW UP:  She is to see Dr. Sherene Sires next available appointment for the  noted lung nodule and increasing size.      Corwin Levins, MD  Electronically Signed     JWJ/MEDQ  D:  04/21/2007  T:  04/21/2007  Job:  [id number]    cc:   Gregary Signs A. Everardo All, MD  [address]  [place]  [place] [address]

## 2010-05-26 NOTE — Discharge Summary (Signed)
NAMESHIRLE, PROVENCAL                 ACCOUNT NO.:  [id number]   MEDICAL RECORD NO.:  [id number]          PATIENT TYPE:  INP   LOCATION:  1405                         FACILITY:  [facility]   PHYSICIAN:  Stacie Glaze, MD    DATE OF BIRTH:  1945-12-25   DATE OF ADMISSION:  11/07/2007  DATE OF DISCHARGE:  11/10/2007                               DISCHARGE SUMMARY   PRIMARY CARE PHYSICIAN:  Dr. Romero Belling.   DISCHARGE DIAGNOSES:  Acute chronic obstructive pulmonary disease  exacerbation in the setting of pneumonia   HISTORY OF PRESENT ILLNESS:  Ms. Moragne is a 65 year old white female  with history of COPD, type 2 diabetes, hypertension, depression and  morbid obesity.  The patient presented to [facility] emergency room on  the day of admission with complaints of shortness of breath.  The  patient was at home prior to this admission doing well on home O2 at 2  liters per minute.  However, several days prior to admission, the  patient reports progressive worsening shortness of breath with fever and  chills with cough productive of yellow sputum.  Upon evaluation in the  emergency room, chest x-ray revealed bilateral interstitial markings  with no consolidations with persistent spiculated right upper lobe  nodule.  White cell count upon admission 14.4.  Patient afebrile at time  of admission; however, reports taking Tylenol just prior to EMS  transport to the emergency room.  Physical exam revealed diffuse  wheezing bilaterally.  The patient was admitted for further evaluation  and treatment.   PAST MEDICAL HISTORY:  1. COPD, home O2 dependent.  2. Abnormal chest x-ray with spiculated right upper lobe nodule.  3. Type 2 diabetes.  4. Hypertension.  5. Morbid obesity.  6. Depression.  7. Continued tobacco abuse.   HOSPITAL COURSE:  1. COPD exacerbation in the setting of pneumonia.  Again, the patient      admitted from the emergency room to the intensive care unit.  She      was  placed on Avelox, as well as IV Solu-Medrol.  A CT angio of the      chest was obtained to further evaluate the patient's nodule seen on      previous chest x-rays.  CT of the chest revealed stable spiculated      density in the right upper lobe and recommended interval follow up      in approximately 6 months to be determined by primary care      physician.  In addition, chest CT revealed consolidation in the      right middle lobe and lingular left lung, consistent with      infection.  Per chest CT, there was no evidence of pulmonary      embolism.  At this time, the patient has responded well to IV Solu-      Medrol and will be tapered to prednisone taper dose and continue on      antibiotic therapy for 10 days total treatment.  The patient also      tested negative for H1N1  during this hospitalization; however, did      complete empiric Tamiflu as influenza AB not ruled out.   DISCHARGE MEDICATIONS:  1. Avelox 400 mg p.o. daily until gone.  2. Prednisone 10 mg Dosepak take as directed.  3. Aspirin 81 mg p.o. daily.  4. Celexa 40 mg p.o. daily.  5. Symbicort 2 puffs inhaled b.i.d.  6. Novolin 70/30 insulin 25 units subcu daily.  7. Lasix 40 mg p.o. daily.  8. Spiriva 18 mcg inhaled daily.  9. Benicar/hydrochlorothiazide 40/25 one-half tablet p.o. daily.  10.Clonidine 0.2 mg p.o. t.i.d.  11.Xanax 0.25 mg 1 tablet q.6 h., p.r.n. anxiety.  12.Albuterol inhaler 1-2 puffs q.4 h., p.r.n. shortness of breath.   PERTINENT LABORATORY WORK:  At the time of discharge, white cell count  12.6, platelet count 258, hemoglobin 14.1, hematocrit 42.2.  Sodium 134,  potassium 4.3, BUN 31, creatinine 1.07.  Cardiac enzymes were negative  x3.  Hemoglobin A1c 7.5.  TSH 0.278.  H1N1 screen negative.  Total  cholesterol 120, triglycerides 83, HDL 28, LDL 75.   DISPOSITION:  The patient felt medically stable for discharge home at  this time.  Prior to this admission, the patient was living with her   daughter; however, the patient has expressed concern and wishes to live  alone.  The patient has been seen in consultation by case manager during  this hospitalization, who was able to follow up with the patient on an  outpatient basis to determine living options.  The patient is instructed  to call her primary care physician, Dr. Romero Belling for follow-up  appointment in approximately 2 weeks.      Cordelia Pen, NP      Stacie Glaze, MD  Electronically Signed    LE/MEDQ  D:  11/10/2007  T:  11/10/2007  Job:  [id number]   cc:   Gregary Signs A. Everardo All, MD  [address]  [place]  [place] [address]

## 2010-05-26 NOTE — Assessment & Plan Note (Signed)
[facility]                             PULMONARY OFFICE NOTE   NAME:[name]                        MRN:          [id number]  DATE:09/22/2006                            DOB:          [birth year]    This is a pulmonary extended follow-up office visit.   HISTORY:  This is a yearly evaluation of this patient, a 65 year old  white female who quit smoking about five years ago (the date keeps  changing so I am not exactly sure) with chronic O2 dependent and steroid  dependent COPD.  The lowest she has been able to taper prednisone is to  10 mg tablets one half every other day and she is not convinced that she  is any better since she started Symbicort 160 two puffs b.i.d. in  addition to Spiriva.  She has not noticed, for example, any decrease in  dyspnea or decrease in need for albuterol.  She denies any excess sputum  production, significant variability with weather or environmental  change, fever, chills, sweats.  No sputum, chest pain or leg swelling.   MEDICATIONS:  For full inventory of medications, please see face sheet  column dated September 22, 2006, but I am not 100% sure she actually  takes this regimen as it is listed.   PHYSICAL EXAMINATION:  VITAL SIGNS:  She is afebrile with stable vital  signs.  Sats only 91% on 3 liters and drops immediately to 87% on room  air.  GENERAL APPEARANCE:  She is an obese, ambulatory white female in no  acute distress.  HEENT:  Unremarkable.  Oropharynx clear.  LUNGS:  Lung fields reveal diminished breath sounds bilaterally with no  wheezing.  CARDIOVASCULAR:  Regular rhythm without murmurs, rubs, or gallops.  ABDOMEN:  Soft and benign.  EXTREMITIES:  Warm without calf tenderness, clubbing, cyanosis, or  edema.   Chest x-ray was ordered.   MDI technique was reviewed.  Even with coaching, she does not improve  above 50% with MDI.   IMPRESSION:  1. Chronic obstructive pulmonary disease with  chronic oxygen dependent      respiratory failure secondary to morbid obesity.  She was certified      for oxygen today based on the fact that she dropped to 87%      immediately off of 3 liters.   I agree with a trial of Symbicort but with the stipulation that she  learn the technique more effectively.  I spent an extra 15-25 minutes of  the visit going over all of her medicines with her in detail and also  how to use Symbicort more effectively.  If she is not able to trigger  and inspire at the same time, she will not get good distribution of  Symbicort into her lower airways and might be a candidate for Brovana  and budesonide in combination per nebulizer.  I will defer that issue,  however, to Dr. Everardo All or see her back here on a p.r.n. basis.  The  most important aspect of her care is to help her get some of  her weight  off and maintain off of cigarettes indefinitely.  Micromanagement here in the pulmonary division is not necessary but we  would certainly be happy to see her back here sooner than a year p.r.n.  at Dr. George Hugh discretion.     [name]. Sherene Sires, MD, [facility]  Electronically Signed    MBW/MedQ  DD: 09/22/2006  DT: 09/23/2006  Job #: [id number]   cc:   Gregary Signs A. Everardo All, MD

## 2010-05-26 NOTE — H&P (Signed)
[name], [name]                 ACCOUNT NO.:  [id number]   MEDICAL RECORD NO.:  [id number]          PATIENT TYPE:  INP   LOCATION:  1230                         FACILITY:  [facility]   PHYSICIAN:  Michiel Cowboy, MDDATE OF BIRTH:  [birth year]   DATE OF ADMISSION:  11/07/2007  DATE OF DISCHARGE:                              HISTORY & PHYSICAL   PRIMARY CARE Genise Strack:  Sean A. Everardo All, MD.   The patient is a 65 year old female with history of COPD, followed by  Dr. Sherene Sires.  She presented with the chief complaint of severe shortness of  breath.  The patient was at home at baseline with oxygen requirement of  about 2 liters.  Over the past few days she has been having progressive  worsening shortness of breath with fevers, chills, worsening wheezing,  nausea and periodic diarrhea.  At the point that she presented she was  having severe shortness of breath, requiring an ambulance.  So they  decided to call 9-1-1.  She had been coughing up yellow phlegm, had a  runny nose.  Prior to presentation to the emergency department the  patient took some Tylenol, but reports at home her fevers were up to  103.  The patient was given Lasix and albuterol enroute to the ED.  She  feels that what helped her best was a nebulizer treatment.   REVIEW OF SYSTEMS:  Unremarkable except for HPI.   PAST MEDICAL HISTORY:  1. COPD; increase in size of right nodule.  2. Diabetes mellitus.  3. Hypertension.  4. Morbid obesity.  5. Depression.  6. Chronic steroid use.   SOCIAL HISTORY:  The patient continues to smoke; reported smoking about  half of a pack of filtered a day.  Does not drink.  Lives at home.  Uses  2-4 liters of oxygen at baseline.   FAMILY HISTORY:  Noncontributory.   ALLERGIES:  NO KNOWN DRUG ALLERGIES.   MEDICATIONS:  1. Aspirin 81 mg p.o. daily.  2. Clonidine 0.2 mg three times a day.  3. Benicar 20/12.5 once a day.  4. Lasix 40 once a day.  5. Spiriva once a day.  6. Citalopram  40 once a day.  7. Symbicort 160/12.5 twice a day.  8. Novolin 70/30 40 units in the a.m. 25 units at p.m.  9. Albuterol as needed.  10.Tylenol as needed.  11.Alprazolam as needed 0.25 mg.   PHYSICAL EXAMINATION:  VITALS:  Temperature 98.4, blood pressure 138/67,  pulse 128, respirations 22, saturations 97% on 50% non-rebreather.  Currently actually not in respiratory distress; able to speak, but  reasonably short of breath.  HEAD:  Nontraumatic.  NECK:  Supple but obese.  LUNGS:  Wheezes bilaterally, but has fair air movement.  HEART:  Regular rate and rhythm but rapid.  No murmurs appreciated.  ABDOMEN:  Obese but nontender and nondistended.  LOWER EXTREMITIES:  No clubbing, cyanosis or edema.  NEUROLOGIC:  Cranial nerves II-XII intact.  Strength 5/5 in all four  extremities.   LABS:  White blood cell count 14.4, hemoglobin 15.6, sodium 137,  potassium 4.0, creatinine  1.3 (which is up from her baseline of 0.9).  D-  dimer 0.67.  CK-MB elevated at 7.6.  Total CK 197, with relative index  of 3.9.  Troponin 0.04.  BNP 281.   EKG:  Showing sinus tachycardia, but otherwise unremarkable except for  small QRS.   CHEST X-RAY:  Showing bilateral interstitial markings, but no  consolidation.  There is a spiculated right upper lobe lung nodule that  persists.   ASSESSMENT/PLAN:  This is a 65 year old female with COPD and worsening  shortness of breath, as well as pleuritic chest pain and severe  wheezing.  I suspect that she may have chronic obstructive pulmonary  disease exacerbation versus viral illness.  Possibly it is influenza.   1. Chronic Obstructive Pulmonary Disease Exacerbation:  Will treat      with Avelox; give steroids, as the patient has a lot of wheezing      and chronically on steroids.  We will do do Atrovent and albuterol      p.r.n.  While the patient has high oxygen requirement, will admit      to step-down and monitor.  Will send for influenza A, B and H1N1       swabs.  Will put on drop precaution.  Will go ahead and treat with      Tamiflu; Consider adding amantadine.  Would recommend involvement      of a pulmonary specialist, as the patient does have a history of      this potential lung nodule.  She will eventually need to have      further investigation and possibly have not return for follow-up      with Dr. Sherene Sires.  2. Pleuritic Chest Pain:  Suspect that this is secondary to the COPD      exacerbation/possible atypical pneumonia caused by influenza versus      cannot rule out pulmonary embolus.  There is an elevated  D-dimer      and for right now will hold off on a CT scan with contrast, as the      patient has somewhat elevated creatinine which is not her baseline.      Will either do a CT scan in the a.m. or a nuclear medicine study in      the a.m. for right now.  Will do Lovenox b.i.d. and follow.  Once      cleared for pulmonary embolus, may change Lovenox to once a day      dosing.  3. Diabetes:  Will do sliding scale insulin.  With decreased p.o.      intake, will change to Lantus and decrease the dose to 25 units.  4. Hypertension:  Will hold off on Benicar, as the patient has      slightly elevated creatinine currently.  Will continue clonidine.   PROPHYLAXIS:  Protonix and Lovenox.   CODE STATUS:  The patient wishes to be FULL CODE, which was discussed  with her.   Dr. Felicity Coyer to assume care in the a.m.      Michiel Cowboy, MD  Electronically Signed     AVD/MEDQ  D:  11/07/2007  T:  11/07/2007  Job:  [id number]   cc:   Gregary Signs A. Everardo All, MD  [address]  [place]  [place] [address]

## 2010-05-26 NOTE — Discharge Summary (Signed)
[name], [name]                 ACCOUNT NO.:  [id number]   MEDICAL RECORD NO.:  [id number]          PATIENT TYPE:  INP   LOCATION:  5151                         FACILITY:  [facility]   PHYSICIAN:  Valerie A. Felicity Coyer, MDDATE OF BIRTH:  June 11, 1945   DATE OF ADMISSION:  06/16/2006  DATE OF DISCHARGE:                               DISCHARGE SUMMARY   DISCHARGE DIAGNOSES:  1. Acute pyelonephritis with Escherichia coli bacteremia.  2. Right upper quadrant pain with a normal-limit abdominal ultrasound      except fatty liver.  3. Diabetes type 2, uncontrolled.  The patient notes that she      discontinued Januvia due to intolerance.  4. Advanced chronic obstructive pulmonary disease, home oxygen and      prednisone dependent.  5. Hypertension.  6. Morbid obesity.  7. Anxiety/depression.   HISTORY OF PRESENT ILLNESS:  [name] is a 64 year old female with a  history of COPD which is home O2 and prednisone dependent who presented  to the emergency room at [facility] secondary to rigors and chills.  She was noted the emergency department to have a urinalysis consistent  with urinary tract infection.  She was admitted for probable  pyelonephritis.   PAST MEDICAL HISTORY:  1. COPD, O2 and prednisone dependent.  2. Dyslipidemia.  3. Diabetes type 2.  4. Hypertension.  5. Obesity.  6. Anxiety/depression.  7. CHF.   COURSE OF HOSPITALIZATION:  #1 - ACUTE PYELONEPHRITIS SECONDARY TO  ESCHERICHIA COLI BACTEREMIA.  The patient was admitted and was initially  placed on IV Rocephin.  She was noted to have E. coli in two out of two  bottles of blood.  E. coli was pansensitive.  Urine culture grew greater  than 100,000 of multiple morphotypes.  The patient was noted to improve  rapidly clinically with the addition of IV antibiotics.  She will be  continued on p.o. Ceftin for a total treatment duration of 14 days.  She  is afebrile at time of discharge.   #2 - RIGHT UPPER QUADRANT PAIN WITH  ELEVATION OF LFTS.  The patient was  noted to have mild elevation of her LFTs.  Followup studies note  downward trend.  Abdominal ultrasound notes fatty liver without focal  abnormality with no gallbladder or ductal pathology noted.  She is  tolerating p.o.'s.  This will need continued outpatient followup.  The  patient is instructed to call Dr. Everardo All should she develop nausea,  vomiting or fever over 101.   #3 - DIABETES TYPE 2, UNCONTROLLED.  The patient notes that she  discontinued the Januvia, which was added previously, due to  intolerance.  She was noted to have a hemoglobin A1c on May 31, 2006, of  8.9.  Blood sugars here have also been elevated.  Initially, her  creatinine on admission was 1.4.  Her metformin was held.  She was  continued on sliding scale coverage and required 20 units of Lantus  insulin in order to keep her blood glucose less than 150.  The patient  is reluctant to start insulin.  Will defer further  management to the  patient's primary care.  At this time we will resume metformin, as her  creatinine is stable at 0.97.   MEDICATIONS AT TIME OF DISCHARGE:  1. Ceftin 500 mg p.o. b.i.d. for 9 days.  2. Glucophage 1000 mg p.o. b.i.d.  3. Prednisone 5 mg p.o. daily.  4. Aspirin 81 mg p.o. daily.  5. Catapres 0.2 mg p.o. t.i.d.  6. Benicar 40/25 one-half tablet p.o. daily.  7. Imdur 30 mg p.o. daily.  8. Lasix 40 mg p.o. daily.  9. Spiriva 18 mcg inhaler one puff daily.  10.Celexa 40 mg p.o. daily.  11.Multivitamin one tablet p.o. daily.  12.Xanax 0.25 mg p.o. q.8h. p.r.n.  13.Albuterol MDI two puffs q.6h. p.r.n.  14.Mucinex one tablet p.o. b.i.d. as needed.  15.Phenergan cough syrup as needed.  The patient is instructed to      continue same dose as before.   PERTINENT LABORATORY DATA AT TIME OF DISCHARGE:  AST 64, ALT 21.  Blood  culture positive for E. coli x2.  Creatinine 0.97.  Hemoglobin 11.8,  hematocrit 36.1.  White blood cell count 10,000.    DISPOSITION:  The patient will be discharged to home.   FOLLOWUP:  The patient is instructed to follow up with Dr. Romero Belling  on June 20, 2006.  She has been instructed to call Dr. Everardo All should her  sugars run greater than 300, if she develops nausea, vomiting, fever  over 101, weakness or worsening abdominal pain.  If severe, she is to  return to the emergency department.      Sandford Craze, NP      Raenette Rover. Felicity Coyer, MD  Electronically Signed    MO/MEDQ  D:  06/20/2006  T:  06/20/2006  Job:  [id number]   cc:   Gregary Signs A. Everardo All, MD

## 2010-05-26 NOTE — H&P (Signed)
NAMEXIN, KLAWITTER                 ACCOUNT NO.:  [id number]   MEDICAL RECORD NO.:  [id number]          PATIENT TYPE:  INP   LOCATION:  0102                         FACILITY:  [facility]   PHYSICIAN:  Therisa Doyne, MD    DATE OF BIRTH:  August 06, 1945   DATE OF ADMISSION:  04/16/2007  DATE OF DISCHARGE:                              HISTORY & PHYSICAL   PRIMARY CARE PHYSICIAN:  Sean A. Everardo All, MD   CHIEF COMPLAINT:  Cough and shortness of breath.   HISTORY OF PRESENT ILLNESS:  A 65 year old white female with past  medical history significant for steroid dependent and oxygen dependent  COPD who presents with shortness of breath and cough.  The patient  reports symptoms of having a viral cold-like syndrome for the past one  month.  She had been treating this with Mucinex.  However, over the past  few days, her symptoms had worsened.  She now reports productive cough  of greenish/whitish sputum.  She has associated pleuritic chest pain as  well as increasing shortness of breath.  In addition, she has had two  episodes of posttussive emesis.  She reports a fever up to 102 last  week.  Because of all these symptoms, she came to the emergency  department for evaluation.   In the emergency department she is found to have pneumonia.  She was  treated with Avelox, given NovoLog for hyperglycemia, and given  albuterol and Atrovent for symptomatic treatment.   REVIEW OF SYSTEMS:  All systems reviewed and are negative except for as  mentioned above in history of present illness.   PAST MEDICAL HISTORY:  1. 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  2. Diabetes mellitus type 2.  3. Morbid obesity.  4. Hypertension.  5. Depression.   SOCIAL HISTORY:  The patient denies tobacco, alcohol or drugs.  She does  have a significant smoking history, but said she quit approximately  five years ago.   FAMILY HISTORY:  Positive for diabetes.   ALLERGIES:  No known drug allergies.   MEDICATIONS:  1.  Multivitamin daily.  2. Aspirin 81 mg daily.  3. Clonidine 0.2 mg t.i.d.  4. Benicar HCT 20/12.5 mg daily.  5. Lasix 40 mg daily.  6. Oxygen 3 liters nasal cannula.  7. Spiriva daily.  8. Celexa 40 mg daily.  9. Prednisone 5 mg daily.  10.Symbicort 160/4.5 two puffs b.i.d.  11.Novolin 70/30 insulin 25 units in the morning.   PHYSICAL EXAMINATION:  VITAL SIGNS:  Temperature 98, blood pressure  148/82, pulse 125 which has since improved to 104, respirations 22.  Oxygen saturation 85% on 4 liters nasal cannula.  This has improved 88%  on 5 liters nasal cannula.  GENERAL:  Mild, distressed obese female.  HEENT:  Normocephalic, atraumatic.  Pupils equal, round and reactive to  light and accommodation.  Oropharynx pink with no sign of lesions.  NECK:  Supple.  No lymphadenopathy, no jugular venous distention.  CARDIOVASCULAR:  Tachycardic but regular.  No murmurs, rubs or gallops.  CHEST:  Forced breath sounds bilaterally.  No wheezing.  ABDOMEN:  Positive bowel sounds.  Soft, nontender, nondistended.  EXTREMITIES:  No cyanosis, clubbing or edema.   LABORATORY DATA:  White blood cell count 12.1, hemoglobin 15.5,  platelets 242.  Sodium 133, potassium 3, chloride 92, bicarbonate 31,  BUN 15, creatinine 0.8, glucose 370.  Elevated D-dimer 0.69.   CT of the chest was negative for pulmonary embolism.  A spiculated right  upper lobe nodule was noticed at approximately 2.3 x 2.8 cm.  This had  increased in size since 2006.  Additionally, upper lobe opacities  consistent with acute infectious or inflammatory disease was seen.   ASSESSMENT/PLAN:  1. Will omit the patient to [facility] service under Dr.      Diamantina Monks care to a stepdown unit monitoring.   1. Shortness of breath.  This is likely from pneumonia based on CT      scan findings as well as leukocytosis.  The microbiological      etiology of her pneumonia is somewhat unclear at this time.  She is      at risk for  atypical medications because of her chronic steroid      use.  Additionally, her CT scan does not reveal lobar infiltrate,      but rather diffuse opacities.  This may put her at risk for      atypical organisms, and therefore it may be prudent to consult      pulmonary for possible bronchoscopy should she not improve.  We      will check blood cultures, sputum cultures, strep pneumo urine      antigen, legionella urine antigen, as well as a nasal swab for      pertussis.  Treat the patient with Avelox 400 mg IV daily.      Symptomatic treatment, albuterol/Atrovent nebulizers.  Continue      prednisone, but will increase to 10 mg daily since she is on      chronic steroids at 5 mg daily, as she is currently stressed.      Continue Spiriva and Symbicort.   1. Hyponatremia.  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      Continue to monitor.   1. Hyperglycemia.  We will start her on an insulin protocol and place      her on a combination of Lantus as well as short acting insulin at      mealtimes.  Check hemoglobin A1C.   1. Hypertension.  Continue home medications of clonidine, Benicar,      HCTZ and Lasix.   1. Fluids/electrolytes/nutrition:  Continue IV fluids.   1. Hypokalemia.  The patient was given potassium chloride in the      emergency department.   1. DVT prophylaxis Lovenox.      Therisa Doyne, MD  Electronically Signed     SJT/MEDQ  D:  04/17/2007  T:  04/17/2007  Job:  [id number]   cc:   Gregary Signs A. Everardo All, MD  [address]  [place]  [place] [address]

## 2010-05-29 NOTE — Assessment & Plan Note (Signed)
[facility]                               PULMONARY OFFICE NOTE   NAME:[name], [name]                        MRN:          [id number]  DATE:10/15/2005                            DOB:          [birth year]    PULMONARY/FOLLOWUP OFFICE VISIT:   HISTORY:  A 65 year old white female with severe COPD with an asthmatic  component that appears to be steroid dependent, status post reported  remote smoking cessation.  She remains oxygen dependent at 2 liters per  minute 24 hours a day and returns for followup stating she has been more  short of breath for the last week.  After many questions and answers, it  turns out that this all happened after she stopped Spiriva, which is listed  as a maintenance on the sheet that she says she is following to the letter  because she ran out.  It was never clear why she ran out and did not  call.   She denies any current sputum, orthopnea, PND, increased leg swelling over  baseline.   PHYSICAL EXAMINATION:  GENERAL:  On presentation, she is an ambulatory and  obese white female in no acute distress who failed to answer any questions I  asked her in a straightforward manner.  VITAL SIGNS:  She is afebrile with stable vital signs.  HEENT:  Unremarkable.  Oropharynx is clear.  LUNGS:  Diminished breath sounds bilaterally.  No wheezing.  HEART:  Regular rhythm without murmur, gallop, or rub.  ABDOMEN:  Soft, benign.  EXTREMITIES:  Warm without calf tenderness, cyanosis, clubbing or edema.   IMPRESSION:  Chronic obstructive pulmonary disease with a possible asthmatic  component that appears better on prednisone and has difficulty tapering it  off.  She might be a candidate, therefore, for Advair, but first I would  make sure that she is using all of her other medicines as they are  prescribed and verify that the maintenance versus p.r.n.'s that she has  listed are actually being used as they were intended.  I have asked  the patient to return to see our nurse practitioner within 2  weeks for this purpose.  In the meantime, we reviewed with her the way she  should taper prednisone up, down or off, depending on to the extent to which  she is short of breath and/or needs albuterol rescue.   The record indicates this patient typically either misses appointments or  shows up so late that she disrupts our office schedule.  I did explain to  the patient if this pattern continues we will not be able to see her here in  the pulmonary clinic and referred her back to Dr. Everardo All for regular  medical followup.   The only ongoing pulmonary issue is whether she might benefit from an  inhaled steroid to stabilize her airways to prevent repeat courses of  prednisone from being needed (which would further complicate, of course, her  diabetes and obesity).  If so, probably the best way to do this is either to  use Advair 2 puffs b.i.d. or supply  her with a nebulizer for which she might  use, for instance, __________ in combination with Pulmicort 0.5 mg b.i.d.  Before we do any of this, though, medication reconciliation is mandatory.   I spent extra time reviewing her medication calendar and also teaching her  optimal DPI technique for which her baseline is only about 50% effective and  improved to about 80% with coaching.            ______________________________  [name]. Sherene Sires, MD, [facility]      MBW/MedQ  DD:  10/15/2005  DT:  10/18/2005  Job #:  [id number]

## 2010-05-29 NOTE — Discharge Summary (Signed)
NAME:  [name], WAHLSTROM                           ACCOUNT NO.:  [id number]   MEDICAL RECORD NO.:  [id number]                   PATIENT TYPE:  INP   LOCATION:  3009                                 FACILITY:  [facility]   PHYSICIAN:  Sean A. Everardo All, M.D. [facility]           DATE OF BIRTH:  1945/03/16   DATE OF ADMISSION:  02/04/2003  DATE OF DISCHARGE:  02/07/2003                                 DISCHARGE SUMMARY   DISCHARGE DIAGNOSES:  1. Shortness of breath.  2. Fever.  3. Acute exacerbation of chronic obstructive pulmonary disease.   BRIEF ADMISSION HISTORY:  Ms. Mcjunkins is a 65 year old white female who  presented with a 3- to 4-day history of progressive cough, shortness of  breath.  She was recently exposed to her grandchild who had a temperature of  103.   PAST MEDICAL HISTORY:  1. Morbid obesity.  2. Adult-onset diabetes mellitus.  3. COPD with recent tobacco use; she states she quit 3 weeks prior to this     admission.  4. Hypertension.  5. Anxiety.  6. Status post total abdominal hysterectomy in 1987.   HOSPITAL COURSE:  PROBLEM #1 - PULMONARY:  The patient presented with  respiratory insufficiency.  This was felt to be secondary to an acute  exacerbation of her COPD for which she uses oxygen at night.  She also had a  question of bilateral infiltrates versus atelectasis on her chest x-ray.  She was started on oxygen, nebulizers, steroids and antibiotics.  Her  condition was slow to improve; this prompted a CT scan of her chest and was  negative for PE, congestive heart failure, infiltrate, although it did  reveal some ground-glass infiltrates and some mild mediastinal  lymphadenopathy which were thought to be benign by the radiologist.  The  patient's condition has slowly improved.  Currently, she has been tapered to  oral steroids and oral antibiotics.  The patient already has oxygen set up  at home.  She is maintaining saturations of around 90% on 1 to 2 L; this is  with  exertion.  The patient will be instructed to go home on continuous  oxygen until she follows up with her primary care physician.  The patient  has been empirically treated with antibiotics, although no definite  pneumonia was clearly found; she probably has some underlying bronchitis.   LABORATORIES AT DISCHARGE:  ABG was refused by the patient.  Hemoglobin 12,  hematocrit 35.5, platelets were normal.  BMET was essentially normal.  Blood  cultures were negative x2.  Sputum was unremarkable.   MEDICATIONS AT DISCHARGE:  1. Aspirin 81 mg daily.  2. Albuterol nebulized t.i.d. for 6 days then q.4-6 h. p.r.n.  3. Lasix 40 mg daily.  4. Xanax 0.25 mg b.i.d.  5. Glucophage 1 g b.i.d., which she may resume on Saturday.  6. Glyburide 1.25 mg daily.  7. Actos 45 mg daily.  8. Avalide  100/25 mg daily.  9. Ceftin 250 mg b.i.d. for 6 days.  10.      Zithromax 250 mg daily, last dose on February 08, 2003.  11.      Prednisone taper.  12.      Oxygen at 2 L.   FOLLOWUP:  Follow up with Dr. Gregary Signs A. Ellison in 2 to 3 weeks.      Cornell Barman, P.A. [facility]                  Sean A. Everardo All, M.D. [facility]    LC/MEDQ  D:  02/07/2003  T:  02/08/2003  Job:  [id number]   cc:   Gregary Signs A. Everardo All, M.D. [facility]   [name]. Sherene Sires, M.D. [facility]

## 2010-05-29 NOTE — H&P (Signed)
NAME:  [name], [name]                           ACCOUNT NO.:  [id number]   MEDICAL RECORD NO.:  [id number]                   PATIENT TYPE:  INP   LOCATION:  1826                                 FACILITY:  [facility]   PHYSICIAN:  Corwin Levins, M.D. [facility]             DATE OF BIRTH:  06/23/1945   DATE OF ADMISSION:  02/03/2003  DATE OF DISCHARGE:                                HISTORY & PHYSICAL   CHIEF COMPLAINT:  A 3-4 day increase in cough, shortness of breath after  exposure to her grandbaby with temperature of 103.   HISTORY OF PRESENT ILLNESS:  [name] is a 65 year old white female here  for the above, also with wheezing, shortness of breath and sharp chest and  back pain with cough all night.   PAST MEDICAL HISTORY:  1. Diabetes.  2. Hypertension.  3. Chronic obstructive pulmonary disease.  4. Anxiety.  5. Morbid obesity.   SURGICAL HISTORY:  Status post TAH in 82.   ALLERGIES:  No known drug allergies.   CURRENT MEDICATIONS:  1. Aspirin 81 mg p.o. daily.  2. Multivitamin daily.  3. Lasix 40 mg p.o. daily.  4. Xanax 0.25 mg b.i.d. p.r.n.  5. Glucophage 1000 mg b.i.d.  6. Glyburide 1.25 mg p.o. daily.  7. Actos 45 mg p.o. daily.  8. Albuterol metered dose inhaler p.r.n.  9. Avalide 100/25, one p.o. daily.   SOCIAL HISTORY:  No tobacco for two years.  Alcohol none.  Currently works  at the [employer].   FAMILY HISTORY:  Significant for cancer, hypothyroidism, and diabetes.   REVIEW OF SYSTEMS:  Otherwise noncontributory.  She was recently seen by Dr.  Everardo All one week ago with glyburide decreased from 2.5 to 1.25 due to the  low sugars.   PHYSICAL EXAMINATION:  GENERAL:  [name] is a 65 year old white female.  VITAL SIGNS:  Blood pressure 138/48, heart rate 118, respirations 30, O2  saturations 70% on room air, over 90% on three liters.  HEENT:  Sclerae are clear.  TM's are clear.  Oropharynx with marked  erythema.  NECK:  Without lymphadenopathy.  CHEST:   Decreased breath sounds bilateral.  CARDIAC:  Regular rate and rhythm.  ABDOMEN:  Soft, nontender.  Positive bowel sounds, no organomegaly or  masses.  EXTREMITIES:  Trace edema bilaterally.   LABORATORY DATA:  A pH of 7.44, pCO2 44, sodium 132, potassium 3.6, chloride  97, bicarbonate 30, BUN 12, glucose 78, hemoglobin 12.6.  CBC and creatinine  pending.  Chest x-ray consistent with chronic obstructive pulmonary disease,  no definite infiltrates.   PLAN:  1. Chronic obstructive pulmonary disease exacerbation.  Bronchitis with     bronchospasm, plus or minus pneumonia.  She is to be admitted, given O2,     nebulizer treatments, IV antibiotics and steroids.  Pulmonary toilet and     followed clinically.  2. Diabetes mellitus.  Check  CBC, and placed on sliding scale insulin.     Would expect increase in sugars based on her IV steroids.  3. Hypertension, otherwise stable.  Continue medication as is.  4. Anxiety.  5. Obesity.  6. Hyponatremia, otherwise minor.  IV fluids and follow up in the a.m.                                                Corwin Levins, M.D. [facility]    JWJ/MEDQ  D:  02/04/2003  T:  02/04/2003  Job:  [id number]   cc:   Gregary Signs A. Everardo All, M.D. [facility]   [name]. Sherene Sires, M.D. [facility]

## 2010-05-29 NOTE — Discharge Summary (Signed)
NAME:  [name], [name]                           ACCOUNT NO.:  [id number]   MEDICAL RECORD NO.:  [id number]                   PATIENT TYPE:  INP   LOCATION:  5731                                 FACILITY:  [facility]   PHYSICIAN:  Casimiro Needle B. Sherene Sires, M.D. [facility]           DATE OF BIRTH:  1945/04/17   DATE OF ADMISSION:  08/22/2001  DATE OF DISCHARGE:  08/25/2001                                 DISCHARGE SUMMARY   FINAL DIAGNOSIS:  1. Acute right leg cellulitis.     A. Negative venous Doppler this admission, performed at the vein clinic        by Dr. Molli Hazard.  2. Morbid obesity complicated by diabetes.  Hemoglobin A1C 14.0,     A. Patient referred to dietary for followup.  3. Chronic obstructive pulmonary disease, status post remote smoking     cessation with no active asthmatic component.   HISTORY:  Please see dictated H&P on 08/22/2001.  This patient was admitted  with severe right leg pain after being injected at the vein clinic for  varicose veins.  She had leg pain, nausea and fever for 2 days prior to  admission and had difficulty walking.  She was admitted and cultured and  placed on antibiotics with initial blood sugar of 260 and a white count of  10,600 with a slight left shift.   She was treated initially with IV antibiotics and defervesced immediately.  The leg gradually improved in terms of both erythema and soft tissue  swelling and she was able to ambulate on the 15th without the need for  narcotics for pain and therefore felt to be an adequate risk for discharge.   Her diabetes was initially addressed with insulin but the insulin was  stopped prior to discharge and she was given Glucotrol 5 mg q.d. with a  blood sugar prior to discharge fasting of 136.   She was seen by dietary and appeared to be somewhat in denial about her  diabetes.  She will need very careful dietary followup for continued weight  loss and will continue on Glucotrol 5 mg q.a.m., feeling this has  the lowest  risk of causing hypoglycemia once the acute stress has resolved from her  illness.   She therefore is being discharged in improved condition.  I have stopped her  thiazide diuretic because I felt it might be contributing to her diabetes  and recommended the following;  1. Continue aspirin 325 mg daily, continue Detrol as before admission.  2. Albuterol 2 puffs q.4h. p.r.n., (beta II restrictions discussed with the     patient already).  3. New medications will include Glucotrol 5 mg q.a.m. with careful     monitoring in the outpatient setting (the patient declines finger sticks     at this time).  4. Duricef 500 one b.i.d. for 7 days.  5. Vioxx 25 mg 1-2 q.d. p.r.n. for  pain or swelling and Darvocet-N 100     reserved 1-2 q.4h. p.r.n. severe pain.  6. The patient has been instructed to keep the leg elevated above the heart     for the next several days and come to the office in 3 days for a recheck.   She will be advised on a low salt, low calorie diet by dietary with followup  arranged as an outpatient.                                              [name]. Sherene Sires, M.D. [facility]   MBW/MEDQ  D:  08/25/2001  T:  08/29/2001  Job:  [id number]

## 2010-05-29 NOTE — Discharge Summary (Signed)
. [facility]  Patient:    [name], [name]                        MRN: [id number] Adm. Date:  [id number] Disc. Date: [id number] Attending:  Levy Sjogren CC:         Dr. Rosalee Kaufman of Critcal Medicine/Pulmonology                           Discharge Summary  DATE OF BIRTH:  [birth year].  DISCHARGE DIAGNOSES: 1.   Severe chronic obstructive pulmonary disease. 2.   Severe emphysema, secondary to long-term smoking, patient minimally      02 dependent. 3.   Right upper lobe lung nodule, being followed. 4.   Smoking. 5.   Obesity. 6.   Anxiety.  DISCHARGE MEDICATIONS: 1.   Albuterol inhaler, two puffs every six hours. 2.   Atrovent inhaler, two puffs every six hours. 3.   Pulmicort Tubuhaler, two puffs every 12 hours. 4.   Prednisone taper 60 mg, 10 mg every six days. 5.   Doxycycline 100 mg, one p.o. b.i.d. for ten days. 6.   Aspirin, one p.o. q.d. 7.   Patient minimally 02 dependent.  CONSULTATIONS:  Dr. Rosalee Kaufman, Critical Care Medicine.  HISTORY AND PHYSICAL:  This is a 65 year old white female with a history of long-time smoking who came to the ER with increasing shortness of breath, sore throat, runny nose and cough productive of green phlegm.  She states that she had been in bed and feeling very weak for a day or two prior to admission, and she reports chest pain which worsens with any exertion, and she was unable to take deep breaths and felt very breathless.  Pulse oximetry was found to be in the low 80s at Urgent Care, which did not improve with nebulizers, and she was sent to the emergency room for evaluation.  She had a chest x-ray which showed possible infiltrate.  PAST MEDICAL HISTORY:  Please see dictated History and Physical.  SURGICAL HISTORY:  Please see dictated History and Physical.  SOCIAL HISTORY:  Please see dictated History and Physical  FAMILY HISTORY:  Please see dictated History and Physical.  PHYSICAL  EXAMINATION: VITAL SIGNS:  Temperature 99.0, blood pressure 165/65, pulse 108, respirations 20, saturation 83% on room air comes to the low 90s with some oxygen. GENERAL:  In general, the patient appears very comfortable at this time, slightly obese, not in any distress.  She is very pleasant and cooperative. HEENT:  Benign with no erythema or exudates noted. NECK:  Her neck is supple, no lymphadenopathy and no JVD. LUNGS:  Her lungs showed markedly increased expiratory phase with decreased breath sounds throughout, and she had expiratory wheezes throughout.  There is no use of accessory muscles at this time. CARDIOVASCULAR:  Regular rate and rhythm with decreased heart sounds secondary to COPD. ABDOMEN:  Slightly obese, but, otherwise, benign. EXTREMITIES:  Varicose veins with scattered psoriasis plaques on her legs. Pulses are strong and palpable. NEUROLOGIC:  Not focal.  LAB WORK:  White cell count is 10.3, hemoglobin 15.1, platelets 185,000, neutrophil count 6.9, lymphocytes 2.6.  ABG this admission, on admission, showed a pH of 7.461 with a C02 of 36.2 and a P02 of 57.  I believe this was on room air.  She had coags which were normal and she had an alpha-1 and an a trypsin checked  which was within normal limits.  She also had a TSH which was slightly low at 0.19.  This warrants rechecking as an outpatient.  RADIOLOGY:  Chest x-ray shows some streaky lower lobe atelectasis which may or may not be new.  CT scan of the chest did show the irregularly marginated opacity in the right upper lobe, scar versus carcinoma.  The patient also had fatty infiltration of the liver.  ECHOCARDIOGRAM:  Other studies this admission included an echocardiogram which showed normal LV function and ejection fraction.  Otherwise, mild aortic sclerosis, but normal valve opening.  Exam, otherwise, normal.  EKG:  Normal sinus rhythm with no changes.  ASSESSMENT: 1.   Chronic obstructive pulmonary disease  exacerbation.  Patient was started      on high-dose steroids and p.o. Levaquin for this, in addition to frequent      nebulizers.  Her pulse oximetry remained in the mid to high 80s on 2      liters of oxygen, and she required prior level of 02.  She reported that      she felt less short of breath shortly after her arrival not due to the      nebulizers and oxygen, but due to her failing to get her 02 sats into the      90s, she was kept in-house.  A CT scan was done to evaluate for possible      interstitial disease, and a lung mass was found which was described      below.  The patient slowly became more able to ambulate without having to      stop for a breath, steroids were taped down, and she was sent home with      home 02.  Dr. Rosalee Kaufman saw the patient and would like to follow up with her      in approximately two weeks, at which time, a pulmonary function test will      be performed, when the patient is closer to her baseline.  Patient was      sent with two weeks of doxycycline and a prescription for Humibid. 2.   Right upper lobe lung mass.  This is very worrisome for cancer, given the      patients long history of smoking.  Again, Dr. Rosalee Kaufman was consulted and      will be following this up as an outpatient.  It may simply be a scar,      again it will be followed up with serial chest x-rays/CT. 3.   Chest pain.  The patient had an episode of chest pain, which was presumed      to be due to anxiety, as EKG changes were not impressive.  This resolved      with anxiolytics.  The patient did not receive cardiac workup at this      time, but if chest pain comes back as an outpatient, she may warrant a      cardiac workup. 4.   Anxiety.  Xanax was used during this admission and it gave the patient      relief.  She was given some to take home as well. 5.   Smoking.  The patient was placed on nicotine patch on this admission and      she did not smoke the entire time.  She was sent home  with instructions      to stop smoking, and to use patches as needed. 6.   Obesity.  The patient received dietary consult this admission, and she      will begin attempts to lose weight.   DISPOSITION:  The patient will be sent home with instructions to follow up with Dr. Verdis Frederickson in approximately six or seven days.  She is also to follow up with Dr. Rosalee Kaufman as directed.  She left the floor instable condition and understood her discharge instructions. DD:  07/18/99 TD:  07/18/99 Job: [id number]

## 2010-05-29 NOTE — Assessment & Plan Note (Signed)
[facility]                               PULMONARY OFFICE NOTE   NAME:Rager, [name]                        MRN:          [id number]  DATE:10/29/2005                            DOB:          [birth year]    HISTORY OF PRESENT ILLNESS:  The patient is a 65 year old white female  patient of Dr. Thurston Hole who has a known history of severe COPD with an  asthmatic component that is steroid dependent.  The patient returns today  for a 2-week followup to review medications.  The patient last visit had had  increased flare of her asthmatic bronchitis after completely tapering off  prednisone.  Prednisone was reinstituted and the patient's symptoms improved  substantially.  The patient is currently on prednisone 10 mg daily.  The  patient unfortunately did not bring in her medications today; however,  brought her medication calendar which we have reviewed in detail.   PAST MEDICAL HISTORY:  Reviewed.   CURRENT MEDICATIONS:  Reviewed.   PHYSICAL EXAMINATION:  GENERAL:  The patient is a pleasant obese female in  no acute distress.  VITAL SIGNS:  She is afebrile with stable vital signs.  O2 saturation of 96%  on 2.5 L.  HEENT:  Head is unremarkable.  NECK:  Supple without adenopathy.  LUNG SOUNDS:  Reveal diminished breath sounds without any wheezing or  crackles.  CARDIAC:  Regular rate.  ABDOMEN:  Soft without any hepatosplenomegaly.  EXTREMITIES:  Warm without any edema.   IMPRESSION AND PLAN:  1. Chronic obstructive pulmonary disease with an asthmatic component.  The      patient is recommended to continue on her current regimen.  She has      been recommended to decrease her prednisone down to a-half a tablet and      maintain at that baseline.  She does have a p.r.n. order if her      breathing worsens that she may increase her prednisone back up to two      tablets daily until 100% back to baseline, and then one tablet daily      for 5 days, and then  back down to a-half a tablet daily.  She will      return here in 6 weeks.  At that time, question if the patient would      benefit from adding in inhaled corticosteroids in hopes to be able to      taper completely off the prednisone.  To note, the patient is a      diabetic.  2. Complex medication regimen.  The patient's medications were reviewed.      Patient      education was provided.  The patient configured medication calendar was      adjusted accordingly and reviewed in detail with the patient.      ______________________________  Rubye Oaks, NP    ______________________________  [name]. Sherene Sires, MD, [facility]     TP/MedQ  DD:  10/29/2005  DT:  11/01/2005  Job #:  [id number]

## 2010-05-29 NOTE — Assessment & Plan Note (Signed)
[name]                             PULMONARY OFFICE NOTE   NAME:[name] [name] [name] [name]                        MRN:          [id number]  DATE:12/30/2005                            DOB:          1945-03-15    HISTORY OF PRESENT ILLNESS:  The patient is a 65 year old white female  patient of Dr. Thurston [name] with a known history of COPD which is steroid  dependant, currently on 5 mg prednisone daily.  The patient present for  an acute office visit complaining of persistent productive cough with  thick yellowish-green sputum and wheezing and cough.  The patient  reports that she was recently started on a 10 day course of doxycycline  which she has taken her last dose today.  Reports that the sputum color  has improved, now is more of a clear color mostly.  She denies any  hemoptysis, orthopnea, PND, or leg swelling.   PAST MEDICAL HISTORY:  Reviewed.   CURRENT MEDICATIONS:  Reviewed.   PHYSICAL EXAMINATION:  The patient is a pleasant female in no acute  distress.  She is afebrile and stable.  VITALS:  Inspiratory O2 saturation is 97% on 2-1/2 liters.  HEENT:  Unremarkable.  NECK:  Supple without adenopathy.  LUNG:  Sounds reveal some external wheezes bilaterally.  CARDIAC:  Regular rate and rhythm.  ABDOMEN:  Soft and benign.  EXTREMITIES:  Without any edema.   PATIENT PLAN:  Acute chronic obstructive pulmonary disease flair. The  patient is to finish doxycycline as scheduled.  No further antibiotics  at this time.  Will continue on Mucinex DM for cough and congestion.  The patient is to increase her prednisone up to 20 mg until better and  then taper it back down to her 5 mg dose daily.  The patient will return  back with Dr. Sherene [name] as scheduled or sooner if needed.      Rubye Oaks, NP  Electronically Signed      [name] [name]. [name] Sires, MD, [facility]  Electronically Signed   TP/MedQ  DD: 12/30/2005  DT: 12/31/2005  Job #: [id number]

## 2010-05-29 NOTE — H&P (Signed)
NAME:  [name], [name]                           ACCOUNT NO.:  [id number]   MEDICAL RECORD NO.:  [id number]                   PATIENT TYPE:  EMS   LOCATION:  [facility]                                 FACILITY:  [facility]   PHYSICIAN:  Oley Balm. Sung Amabile, M.D. [facility]          DATE OF BIRTH:  1945-01-19   DATE OF ADMISSION:  08/22/2001  DATE OF DISCHARGE:                                HISTORY & PHYSICAL   ADMISSION DIAGNOSIS:  Right lower extremity cellulitis.   HISTORY OF PRESENT ILLNESS:  The patient is a 65 year old patient who  identifies Dr. Sherene Sires as her primary physician.  He follows her for COPD and  hypertension.  She recently underwent treatment at the vein clinic for right  lower extremity varicose veins.  She presented there today with 24 hours of  right lower extremity erythema, tenderness, and fever.  Dr. Donia Ast performed  lower extremity ultrasound which showed no evidence of deep vein thrombosis.  He recommended, after speaking with me on the phone,that she be admitted for  treatment of cellulitis.  She was sent to the emergency department at [facility] where I initially encountered her.  She has no cough  or sputum production.  No chest pain, pressure tightness, or heaviness.  No  nausea, vomiting, diarrhea, or dysuria.   PAST MEDICAL HISTORY:  1. Hypertension.  2. COPD.  3. Obesity.   MEDICATIONS:  Maxzide, Detrol LA, aspirin, Phentermine, albuterol as needed,  oxygen as needed.   SOCIAL HISTORY:  She quit smoking one or two years ago.  She is somewhat  vague on this.  She has no history of significant occupational or  environmental exposures.  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   FAMILY HISTORY:  Reviewed previously and noncontributory.   REVIEW OF SYSTEMS:  As per History of Present Illness and other negative.   PHYSICAL EXAMINATION:  VITAL SIGNS:  Temperature 101.0, blood pressure  142/90, pulse 115 and regular,  respirations 16 and unlabored.  Room air  oxygen saturation is 91%.  GENERAL:  Well developed, well nourished, in no acute cardiac or respiratory  distress.  HEENT:  Reveals no acute abnormalities.  NECK:  Supple without adenopathy or jugular venous distention.  CHEST:  Normal percussion.  Breaths sounds are mildly diminished without  adventitious sounds.  CARDIAC:  Tachycardia with regular rhythm and no murmurs.  ABDOMEN:  Soft and normal bowel sounds.  EXTREMITIES:  Severe erythema and warmth in the right lower extremity.  There is very mild pretibial edema.  Distal pulses are 2+ symmetrically.  Severe varicosities are noted in the left lower extremity.  NEUROLOGIC:  No focal deficits.   LABORATORY DATA:  CBC reveals white blood cell count of 10,800 with 80%  polys. Chemistries are notable for a sodium of 130 and a potassium of 3.1.  BUN and creatinine are normal.  Glucose is  elevated at 352.   IMPRESSION:  1. 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  2. Right lower extremity cellulitis; deep vein thrombosis has been ruled out     on the ultrasound at the vein clinic.  3. History of chronic obstructive pulmonary disease, hypertension, and     obesity.  4. Newly detected hyperglycemia without prior history of diabetes.  5. Hypokalemia.   PLAN/RECOMMENDATIONS:  1. She will be admitted for intravenous antibiotics with ceftriaxone.  2. We will continue her home medical regimen as outlined above.  In     addition, low-flow oxygen will be administered as well as p.r.n.     medications for fever and pain.  3. She will be started on a diabetic diet and covered with sliding scale     insulin.  Hemoglobin A1C will be checked.  4. Potassium will be depleted, and basic metabolic panel will be rechecked     on the morning of 08/24/2001.                                               Oley Balm Sung Amabile, M.D. [facility]    DBS/MEDQ  D:  08/22/2001  T:  08/25/2001  Job:   [id number]   cc:   Consuello Closs, M.D.   [name]. Sherene Sires, M.D. [facility]

## 2010-05-29 NOTE — Assessment & Plan Note (Signed)
[facility]                               PULMONARY OFFICE NOTE   NAME:[name], [name] QUIJAS                        MRN:          [id number]  DATE:08/19/2005                            DOB:          August 03, 1945    HISTORY:  The patient was admitted from July 12 through August 04, 2005 with  COPD exacerbation with hypercarbic respiratory failure that did not appear  to respond to BiPAP.  She was discharged as an no code blue and also treated  empirically for both hypertensive CHF, chronic anxiety and depression.  At  home, she has done about the same.  Continues oxygen dependent 24 hours a  day on a complex medical regimen which I have reviewed with her in detail,  and inventoried on the face sheet dated August 19, 2005.   She denies any orthopnea, PND, chest pain, fevers, chills, sweats, increased  dyspnea of her baseline, cough, overt sinus or reflux symptoms.   PHYSICAL EXAMINATION:  She is an obese white female in no acute distress  with somewhat of a helpless and hopeless affect and attitude.  She is  afebrile on vital signs.  HEENT is unremarkable.  Oropharynx is clear.  Neck  is supple without cervical adenopathy or tenderness.  Trachea is noted.  Lung fields reveal diminished breath sounds with no wheezing.  There is  regular rhythm.  No murmur, rub, or gallop.  Abdomen is soft and benign.  Extremities has calf tenderness.  Saturation is 94% on two and a half  liters.   IMPRESSION:  1. Morbid obesity and chronic obstructive pulmonary disease combining to      cause hypercarbic respiratory failure with no further reversibility      likely.  In this setting, I believe we can reduce prednisone down to 10      mg q.a.m.  2. Hypertension is adequately controlled on her present regimen but she is      having intermittent orthostatic symptoms on high-dose Benicar.  I have      recommended reducing the Benicar to one half.  3. I went over each and every one  of her maintenance versus p.r.n.      medicines so she would not get off track in terms of the      recommendation presenting her with a very simplified and unambiguous      user friendly medication counter at the end of the visit which was      reviewed with her line by line using a computerized calendar format.   Follow up will be every two weeks to try to reduce the likelihood of  hospitalization.  If she fails to thrive at home, the next step would be  Hospice referral as she has a full no code blue status now.                                   [name]. [name] Sires, MD, [facility]   MBW/MedQ  DD:  08/19/2005  DT:  08/20/2005  Job #:  [id number]

## 2010-05-29 NOTE — Assessment & Plan Note (Signed)
[facility]                             PULMONARY OFFICE NOTE   NAME:[name], [name]                        MRN:          [id number]  DATE:12/10/2005                            DOB:          [birth year]    HISTORY OF PRESENT ILLNESS:  A 65 year old white female with morbid  obesity and COPD with an apparent steroid dependent component for which  she uses floor of 5 mg daily with nevertheless, continued need for  frequent albuterol inhaler.  She comes in sick for the week with some  green and yellow sputum production, but no pleuritic or exertional chest  pain, orthopnea, PND or leg swelling.  For complex medical regimen,  please see face sheet dated December 10, 2005.   PHYSICAL EXAMINATION:  GENERAL:  She is a depressed-appearing,  ambulatory, obese white female in no acute distress weighing 255 pounds.  No change from baseline.  HEENT:  Unremarkable.  NECK:  Clear.  LUNGS:  Lung fields revealed diminished breath sounds.  No wheezing.  HEART:  Regular rhythm without murmurs, gallops, rubs.  ABDOMEN:  Soft.  Benign.  EXTREMITIES:  Warm without calf tenderness, cyanosis, clubbing or edema.   IMPRESSION:  1. Acute URI with early purulent tracheal bronchitis.  Recommended      doxycycline for a seven day course.  2. Chronic obstructive pulmonary disease with apparent asthmatic      component.  The fact that she still requires so much albuterol      would argue for initiation of a long acting bronchodilator, perhaps      Symbicort, to reduce her need for beta 2 agonists and also perhaps      systemic steroids.  I did not feel comfortable starting this today      in the setting of an acute tracheal bronchitis, but when she      returns in six weeks, if the albuterol tendency has not improved,      will need to consider it then.   I did review each and every one of her maintenance versus p.r.n.  medications from her medication calendar which provided  unambiguous and  user friendly methods for medication administration and reconciliation  with our records.     [name]. Sherene Sires, MD, [facility]  Electronically Signed    MBW/MedQ  DD: 12/10/2005  DT: 12/11/2005  Job #: [id number]

## 2010-05-29 NOTE — Assessment & Plan Note (Signed)
[facility]                             PULMONARY OFFICE NOTE   NAME:[name], [name]                        MRN:          [id number]  DATE:01/21/2006                            DOB:          March 18, 1945    HISTORY OF PRESENT ILLNESS:  The patient is a 65 year old white female  patient of Dr. Sherene [name] with a known history of COPD, presents for a 2-week  followup.  The patient briefly had a flare of her COPD, now finished a  10-day course of doxycycline and increased prednisone up to 20 mg.  The  patient returns today reporting that she is substantially better after  finishing antibiotics.  The patient does complain over the last 2 to 3  days that she has had some increased nasal congestion.  She denies any  purulent sputum, chest pain, orthopnea, PND or leg swelling. The patient  still remains on 20 mg of prednisone and has not tapered down as  recommended.   PAST MEDICAL HISTORY:  Is reviewed.   CURRENT MEDICATIONS:  Reviewed.   PHYSICAL EXAMINATION:  The patient is a pleasant female in no acute  distress.  She is afebrile with stable vital signs.  Her O2 saturation is 95% on  2.5 L.  HEENT:  Nasal mucosa is somewhat pale.  Nontender sinuses.  Conjunctivae  are clear.  NECK:  Is supple without adenopathy.  LUNGS:  Sounds reveal diminished breath sounds.  No wheezing is noted.  CARDIAC:  Regular rate.  ABDOMEN:  Is soft and benign.  EXTREMITIES:  Warm without any edema.   IMPRESSION/PLAN:  1. Recent chronic obstructive pulmonary disease exacerbation, now      resolved. The patient is to continue on Mucinex DM for cough and      congestion.  She will decrease prednisone down to 1 daily for 5      days and then down to 1/2 tablet daily, which is 5 mg.  2. Acute rhinitis.  The patient is to use saline nasal spray as needed      and continue on Mucinex DM as needed for congestion.  The patient      will return back with Dr. Sherene [name] in 1 month or sooner if  needed.     Rubye Oaks, NP  Electronically Signed      [name]. [name] Sires, MD, [facility]  Electronically Signed   TP/MedQ  DD: 01/24/2006  DT: 01/24/2006  Job #: [id number]

## 2010-05-29 NOTE — Assessment & Plan Note (Signed)
[facility]                             PULMONARY OFFICE NOTE   NAME:[name], [name]                        MRN:          [id number]  DATE:05/10/2006                            DOB:          Feb 08, 1945    HISTORY:  A 65 year old white female with morbid obesity and COPD,  status post __________ three years ago, who is maintained on oxygen 24  hours a day and also prednisone with a __________ of 5 mg daily, with  minimum need for albuterol (less than twice weekly on the average),  maintained on Spiriva one daily and a very complicated medical regimen  that she keeps track of using a medication calendar that was generated  by our nurse practitioner.   She denies any recent change in her symptoms of dyspnea with exertion.  No orthopnea, PND.  Minimal increase in her  leg swelling over baseline  but no calf pain or asymmetry to the swelling that tends to resolve  overnight.   PHYSICAL EXAMINATION:  GENERAL:  She is an obese, ambulatory, white  female in no acute distress, weighing 269, which is up nine pounds from  previous visit.  She has stable vital signs.  HEENT:  Unremarkable.  NECK:  Clear.  LUNGS:  Fields with diminished breath sounds bilaterally.  No wheezing.  CARDIAC:  Regular rate and rhythm, without murmur, gallop, or rub.  S1  and S2 were very distant.  ABDOMEN:  Soft and benign but obese.  EXTREMITIES:  Warm without calf tenderness.  No cyanosis or clubbing.  There was very mild venous stasis dermatitis and trace pitting in both  extremities but no calf tenderness.   IMPRESSION:  1. Chronic obstructive pulmonary disease and obesity combining to      create chronic oxygen dependent respiratory failure.  Although she      is being treated a bit like an asthmatic with prednisone, note      that most of her disease is fixed in nature and related to both the      effects of obesity and obstruction.  If she does notice an      increased need  for albuterol or prednisone, certainly we could      challenge her with a combination product like Advair or Spiriva,      although this would be quite expensive.  I note that she has been      steroid dependent for over a year and at this point doing well on 5      mg per day.  I would recommend she try 5 mg every other day,      basically a physiologic dose of prednisone for her.  2. Leg swelling.  This is probably multifactorial and certainly      appears chronic.  I have deferred      the management of this back to Dr. Jonny Ruiz, who has already treated      her with furosemide, and offered to see her back here on an as      needed basis for pulmonary symptoms.  [name]. Sherene Sires, MD, [facility]  Electronically Signed    MBW/MedQ  DD: 05/10/2006  DT: 05/11/2006  Job #: [id number]   cc:   Dr. Jonny Ruiz

## 2010-05-29 NOTE — H&P (Signed)
NAMEPECOLIA, MARANDO                 ACCOUNT NO.:  [id number]   MEDICAL RECORD NO.:  [id number]          PATIENT TYPE:  INP   LOCATION:  0104                         FACILITY:  [facility]   PHYSICIAN:  Casimiro Needle B. Sherene Sires, M.D. [facility] OF BIRTH:  27-May-1945   DATE OF ADMISSION:  07/22/2005  DATE OF DISCHARGE:                                HISTORY & PHYSICAL   CHIEF COMPLAINT:  Acute dyspnea, cough.   HISTORY OF PRESENT ILLNESS:  This is an anxious morbidly obese white female  who presents with a 4-5-day history of increasing shortness of breath.  On a  good day, she claims she can do light housework such as washing dishes or  anything standing still, but has significant baseline exertional dyspnea  whereby she is unable to do activities such as make a bed, vacuum, or any  other activity that would require exertional movement.  She reports upon  presentation that her dyspnea has worsened to the point where she actually  presented to the emergency room 2 days ago.  At that point, she was seen and  evaluated, she was treated with an intravenous injection of Solu-Medrol, and  then sent home with a prescription of both prednisone 20 mg tablets and  azithromycin.  She did fill the prednisone prescription and had taken 2  tablets of 20 mg pills, however, she did not fill the azithromycin given the  expense of the medication and actually had an appointment with Dr. Everardo All  this morning ____________antibiotic.  She reports her initial cough was  green to gold colored sputum, which has been productively more difficult to  expectorate as her respiratory status has worsened.  She reports significant  postnasal drip, nasal congestion, wheeze, chest congestion, occasional chest  pain associated with cough.  She also reports increased subjective weight,  and increased lower extremity edema and fluid accumulation._____________ and  albuterol 1-2 times a day, but over the last 3 days she has been taking her  rescue beta agonist every 3-4 hours with minimal relief over the last 24  hours.  Treatments thus far in the emergency room has consisted of  supplemental oxygen at 3 now increased to 4 liters, IV Solu-Medrol, inhaled  albuterol nebs x2, and magnesium sulfate infusing now.  With this therapy to  date, she is beginning to feel mild relief in her dyspnea.   PAST MEDICAL HISTORY:  She has been informed she should wear oxygen 24 hours  a day but on a typical day wears this only at h.s. and p.r.n. She has  multiple office visits recording her oxygen saturations in the 83-85 range.  She has a history of severe chronic obstructive pulmonary disease with a  baseline. Morbid obesity with a baseline weight of 258 pounds.  Cor  pulmonale. Medical noncompliance. History of total abdominal hysterectomy.   MEDICATIONS:  1.  Aspirin 81 mg p.o. daily.  2.  Lasix 40 mg p.o. daily.  3.  Metformin ER 500 mg 2 tablets twice a day.  4.  Xanax 0.25 mg q.8 h p.r.n. anxiety.  5.  Spiriva 18 mcg  cap one inhaled daily.  6.  Avalide 300/25 1 p.o. daily.  7.  _____________ p.o. before hour of bed.  8.  Citalopram 40 mg p.o. daily q.4 h p.r.n. shortness of breath.   ALLERGIES:  No known drug allergies.   SOCIAL HISTORY:  Lives at home, stopped smoking several years ago.  She has  baseline disability secondary to her chronic respiratory failure.  See HPI.  She is a full code status at this point.   FAMILY HISTORY:  Positive for a cancer, hypothyroidism, and diabetes type 2.   REVIEW OF SYSTEMS:  Per HPI from the20s.  Blood pressure 187/89, saturations  85 on 3 liters, increased to 88% on 4 liters supplemental nasal cannula.   PHYSICAL EXAMINATION:  GENERAL:  Anxious, morbidly obese white female in  moderate respiratory distress currently. Initially when evaluated was in  tripod position, currently respirations are less labored after ER  interventions.  HEENT: Neck is large, her posterior pharynx is slightly  erythemic with clear  discharge. There is no obvious jugular venous distension or painful  adenopathy.  PULMONARY:  She has diffuse scattered rhonchi with expiratory wheezes and  bibasilar crackles. Her chest x-ray is consistent with chronic obstructive  pulmonary disease changes and perhaps some mild vascular congestion,  however, this is not much different than previous obtain chest x-rays.  CARDIAC:  Regular rate and rhythm.  EXTREMITIES:  She has bilateral lower extremity edema, brisk capillary  refill. Her extremities are warm to palpation with 2+ pulses.  She does have  some chronic venous stasis changes in both lower extremities.  ABDOMEN:  Soft, nontender, no palpable masses.  Positive bowel sounds upon  auscultation.  NEUROLOGIC:  She is alert, anxious, without focal deficits.   LABORATORY DATA:  Sodium 134, potassium 3.1, chloride 88, CO2 36, BUN 12,  creatinine 0.7, glucose 187.  White blood cells 14.3, hemoglobin 13.4,  platelets 336.  BNP 1200.   IMPRESSION AND PLAN:  1.  Acute on chronic respiratory failure secondary to acute bronchitis flare      in the setting of severe chronic obstructive pulmonary disease and cor      pulmonale complicated by morbid obesity, with baseline weight of 258      pounds.  The plan is to admit Ms. Lynch to a step-down unit.  Provide      aggressive bronchodilator therapy, IV steroids, empiric antibiotics      after cultures.  Perhaps p.r.n. BiPAP  should her respiratory status      continue.  Finally, need to further discuss end-of-life.  This was      broached upon evaluation in regards to would she want to life support      specifically mechanical ventilation.  Ms. Kellison states that she would be      in support of mechanical ventilation if an event were deemed to be      reversible.  She was informed that given her severe chronic respiratory     failure, there would be a high risk for prolonged mechanical ventilation      dependence  should she require mechanical ventilation and therefore could      not guarantee we could get her off the ventilator if she deteriorated to      that point.  At this point, she is still unclear exactly what her      desires are at this point. She wishes to further discuss this with her  family, and this should be further discussed by her primary care      physician in the future as even if she does get through this      hospitalization it will most certainly happen again.  2.  Hypertension.  The plan for this is to monitor in the step-down unit.      Provide her maintenance medications consisting of Avalide and Lasix.  3.  Diabetes type 2, with hyperglycemia.  This will no doubt be exacerbated      by intravenous Solu-Medrol.  Plan for this is to provide scheduled      metformin as well as sliding scale supplemental insulin.  4.  Chronic anxiety.  Plan for this is to continue supplemental Xanax and      p.r.n. lorazepam if needed.  5.  Hypokalemia.  Plan for this is to replete potassium orally.  6.  Prophylaxis.  Plan for this is to provide IV proton pump inhibitors, and      low-molecular-weight heparin subcutaneously.  Ms. Valenti is currently      being evaluated in the emergency room, and awaiting step-down bed      status.      Anders Simmonds, N.P. [facility]    ______________________________  [name]. Sherene Sires, M.D. [facility]    PB/MEDQ  D:  07/22/2005  T:  07/22/2005  Job:  [id number]   cc:   Gregary Signs A. Everardo All, M.D. [facility]  [address]  [place]  [place] [address]

## 2010-05-29 NOTE — Discharge Summary (Signed)
[name], [name]                 ACCOUNT NO.:  [id number]   MEDICAL RECORD NO.:  [id number]          PATIENT TYPE:  INP   LOCATION:  1608                         FACILITY:  [facility]   PHYSICIAN:  Casimiro Needle B. Sherene Sires, M.D. [facility] OF BIRTH:  April 11, 1945   DATE OF ADMISSION:  07/22/2005  DATE OF DISCHARGE:  08/04/2005                                 DISCHARGE SUMMARY   FINAL DIAGNOSES:  1.  Chronic obstructive pulmonary disease exacerbation with acute on chronic      hypercarbic and hypoxemic respiratory failure.      1.  Made No Code Blue this admission.      2.  Now 24-hour oxygen dependence status.  2.  Diabetes mellitus.  3.  Hypertension.  4.  Probable congestive heart failure.      1.  Attempt at 2-D echocardiogram technically difficult this          admission, treated empirically      2.  Trace positive cardiac enzymes, treated also medically based on code          status.  5.  Chronic anxiety and depression.  6.  Morbid obesity complicated by diabetes.   HISTORY:  Please see dictated H&P.  This patient at baseline is short of  breath with minimal activity and came in with acute on chronic respiratory  distress with hypercarbic respiratory failure with vague changes on chest x-  ray suggestive either of  pneumonia or heart failure.  She was treated for  both and improved clinically on BiPAP but still had significant elevations  of pCO2 and declined intubation and desired No Code Blue status.  She was  transferred to the floor where we continued to treat her aggressively for  COPD.  She received a total of 8 days of Avelox and was switched over to  p.o.'s with persistent hypoxemic respiratory failure (saturation only 79% on  room air).  Prior to discharge, it was to be determined how much oxygen  would require at rest 24 hours a day, and when she returns to the office  will titrate her oxygen further.   At time of discharge, she was free of wheezing.  She was comfortable at  rest, able to eat and transfer to the bathroom and back.  I strongly  recommended nursing home placement for this patient with severe general  debilitation, but she declined and will, therefore, receive maximum home  health through [facility] on the following medical regimen.  1.  Multivitamins 1 q.a.m.  2.  Aspirin 81 mg 1 q.a.m.  3.  Detrol LA 4 mg q.a.m.  4.  Avalide 300/25 one q.a.m.  5.  Furosemide 40 mg q.a.m.  6.  Metformin 5 mg 2 b.i.d.  7.  O2 at 2 liters per minute 24 hours a day.  8.  Lovastatin 20 mg q.p.m.  9.  Spiriva 1 capsule q.a.m.  10. Celexa 40 mg q.a.m.  11. Clonidine 0.2 mg 3 times a day  12. Imdur 30 mg 1 q.a.m.  13. Prednisone 10 mg tablets 2 q.a.m. for now.  14. Xanax  0.5 mg 1 4 times a day.  15. For subjective wheezing/dyspnea, she can use albuterol 2 puffs,      otherwise as needed.  16. For anxiety, she can use additional alprazolam 0.25 mg 1 every 8 hours      as needed.  17. For cough, Mucinex DM 1-2 q. 12 h.  18. For pain, Tylenol 500 mg per bottle.  19. She also has a flutter valve for home use.   She has been supplied a rolling rocker, home O2.  Occupational therapy and  physical therapy to see her home and followup within a week, on a low-salt,  low-starch diet, as a full No Code Blue as an outpatient.   Prior to discharge, the patient was strongly encouraged to consider nursing  home placement, but she declined.  It remains to be seen if she can actually  manage her complex medical regimen at home and also maintain off cigarettes  on oxygen 24 hours a day.           ______________________________  [name]. Sherene Sires, M.D. [facility]     MBW/MEDQ  D:  08/04/2005  T:  08/04/2005  Job:  [id number]

## 2010-05-29 NOTE — Assessment & Plan Note (Signed)
[facility]                               PULMONARY OFFICE NOTE   NAME:[name], [name]                        MRN:          [id number]  DATE:09/02/2005                            DOB:          04-29-1945    HISTORY:  A 65 year old white female smoker with recent smoking cessation  complicated by COPD with morbid obesity with chronic hypocarbic and  hypoxemic respiratory failure, now maintained on oxygen 24 hours a day at  2.5 liters per minute and doing much better than she was prior to  hospitalization.  She is fully ambulatory now and comes in for follow up on  prednisone having been tapered down to 10 mg per day.  Previously she did  not require daily steroids.   She denies any exertional chest pain, orthopnea, PND or leg swelling.   MEDICATIONS:  Reviewed with her in detail using the medication calendar  format that she carries with her.   PHYSICAL EXAMINATION:  GENERAL:  She is a pleasant, ambulatory, obese white  female in no acute distress.  She appears much better, much more hopeful  than on previous visits.  HEENT:  Unremarkable.  Oropharynx clear.  LUNGS:  Lung fields revealed diminished breath sounds but no wheezing.  HEART:  Regular rhythm without murmur, gallop, rub.  ABDOMEN:  Soft, benign.  EXTREMITIES:  Warm and dry without calf tenderness, clotting, or edema.   IMPRESSION:  1. Severe chronic obstructive pulmonary disease with hypercarbic,      hypoxemic respiratory failure.  She is O2 dependent but probably not      steroid dependent at this point.  I gave her a tapering schedule to      taper completely off with the option to go back to 20 mg per day if her      breathing worsens.  2. Hypertension.  We did adjust her hypertensives on the last visit and      blood pressure is excellent, 120/68, with no significant fluid      retention.   I made the above changes on the medication sheet that she carries with her  and will ask  all the doctors who see her to do the same with an updated date  at the top of the list indicating the newest version of her medicines.                                   [name]. Sherene Sires, MD, [facility]   MBW/MedQ  DD:  09/02/2005  DT:  09/02/2005  Job #:  [id number]

## 2010-05-29 NOTE — Assessment & Plan Note (Signed)
[facility]                             PULMONARY OFFICE NOTE   NAME:[name], [name] [name]                        MRN:          [id number]  DATE:02/17/2006                            DOB:          1945-04-29    HISTORY OF PRESENT ILLNESS:  The patient is a 65 year old white female  patient of Dr. Thurston [name], with a known history of COPD, presents for a 1-  month followup.  The patient since last visit has been doing well.  She  has been able to taper down to 5 mg of prednisone.  The patient denies  any chest pain, wheezing, increased shortness of breath or leg swelling.  The patient is currently maintained on 2 liters of oxygen continuously,  Spiriva daily and 5 mg of prednisone.   PAST MEDICAL HISTORY:  Reviewed.   CURRENT MEDICATIONS:  Reviewed.   PHYSICAL EXAMINATION:  The patient is a pleasant, morbidly obese female  in no acute distress.  She is afebrile with stable vital signs.  Heart rate recheck is 84.  O2  saturation is 95% on room air.  HEENT:  Unremarkable.  NECK:  Supple without adenopathy.  No JVD.  LUNGS:  Lung sounds reveal diminished breath sounds at the bases,  otherwise clear.  CARDIAC:  Regular rate.  ABDOMEN:  Soft and nontender and obese.  EXTREMITIES:  Warm without any edema.   IMPRESSION AND PLAN:  Chronic obstructive pulmonary disease, well  compensated on her present regimen.  The patient is to maintain  prednisone 5 mg daily along with Spiriva daily.  The patient will  recheck back with Dr. Sherene [name] in 6 to 8 weeks, or sooner if needed.      [name] Oaks, NP  Electronically Signed      [name]. [name] Sires, MD, [facility]  Electronically Signed   TP/MedQ  DD: 02/17/2006  DT: 02/18/2006  Job #: [id number]

## 2010-06-05 ENCOUNTER — Ambulatory Visit: Payer: Medicare Other | Admitting: Endocrinology

## 2010-06-12 ENCOUNTER — Ambulatory Visit: Payer: Medicare Other | Admitting: Endocrinology

## 2010-06-15 ENCOUNTER — Ambulatory Visit: Payer: Medicare Other | Admitting: Endocrinology

## 2010-06-15 DIAGNOSIS — Z0289 Encounter for other administrative examinations: Secondary | ICD-10-CM

## 2010-06-20 ENCOUNTER — Other Ambulatory Visit: Payer: Self-pay | Admitting: Endocrinology

## 2010-06-22 ENCOUNTER — Ambulatory Visit: Payer: Medicare Other | Admitting: Internal Medicine

## 2010-06-22 NOTE — Telephone Encounter (Signed)
SAE pt-out of office-please advise 

## 2010-06-22 NOTE — Telephone Encounter (Signed)
Rx faxed to [facility].

## 2010-07-07 ENCOUNTER — Ambulatory Visit: Payer: Medicare Other | Admitting: Endocrinology

## 2010-07-20 ENCOUNTER — Other Ambulatory Visit: Payer: Self-pay | Admitting: Internal Medicine

## 2010-07-20 ENCOUNTER — Other Ambulatory Visit: Payer: Self-pay | Admitting: Endocrinology

## 2010-07-21 NOTE — Telephone Encounter (Signed)
Faxed script back to [facility] @ [phone].Marland KitchenMarland Kitchen7/10/12@1 :50pm/LMB

## 2010-07-27 ENCOUNTER — Ambulatory Visit: Payer: Medicare Other | Admitting: Endocrinology

## 2010-07-27 DIAGNOSIS — Z0289 Encounter for other administrative examinations: Secondary | ICD-10-CM

## 2010-08-04 ENCOUNTER — Ambulatory Visit: Payer: Medicare Other | Admitting: Internal Medicine

## 2010-08-21 ENCOUNTER — Other Ambulatory Visit: Payer: Self-pay | Admitting: Internal Medicine

## 2010-08-21 NOTE — Telephone Encounter (Signed)
i printed refill of each x 1 Oc due

## 2010-08-24 NOTE — Telephone Encounter (Signed)
Rx faxed to pharmacy  

## 2010-09-03 ENCOUNTER — Encounter: Payer: Self-pay | Admitting: Endocrinology

## 2010-09-03 ENCOUNTER — Other Ambulatory Visit (INDEPENDENT_AMBULATORY_CARE_PROVIDER_SITE_OTHER): Payer: Medicare Other

## 2010-09-03 ENCOUNTER — Ambulatory Visit (INDEPENDENT_AMBULATORY_CARE_PROVIDER_SITE_OTHER): Payer: Medicare Other | Admitting: Endocrinology

## 2010-09-03 DIAGNOSIS — N2581 Secondary hyperparathyroidism of renal origin: Secondary | ICD-10-CM

## 2010-09-03 DIAGNOSIS — D509 Iron deficiency anemia, unspecified: Secondary | ICD-10-CM

## 2010-09-03 DIAGNOSIS — Z79899 Other long term (current) drug therapy: Secondary | ICD-10-CM

## 2010-09-03 DIAGNOSIS — E119 Type 2 diabetes mellitus without complications: Secondary | ICD-10-CM

## 2010-09-03 LAB — CBC WITH DIFFERENTIAL/PLATELET
Basophils Relative: 1.1 % (ref 0.0–3.0)
Hemoglobin: 11.3 g/dL — ABNORMAL LOW (ref 12.0–15.0)
Lymphocytes Relative: 20.7 % (ref 12.0–46.0)
Monocytes Relative: 4.8 % (ref 3.0–12.0)
Neutro Abs: 6.9 10*3/uL (ref 1.4–7.7)
RBC: 4.02 Mil/uL (ref 3.87–5.11)

## 2010-09-03 NOTE — Progress Notes (Signed)
Subjective:    Patient ID: [name], female    DOB: 1945-11-04, 65 y.o.   MRN: [id number]  HPI no cbg record, but states cbg was in the 60's in the afternoon once.   She had sxs, which were promptly resolved with oral glucose.  She says cbg's are highest before lunch, and in the afternoon.   Denies brbpr Denies cramps She reports few mos of moderate itching throughout the body, but no assoc rash.    Past Medical History  Diagnosis Date  . RENAL INSUFFICIENCY 10/15/2008  . Edema 06/18/2008  . PNEUMONIA 12/01/2007  . CARPAL TUNNEL SYNDROME, BILATERAL 10/30/2007  . HAND PAIN, LEFT 09/04/2007  . Morbid obesity   . ANXIETY 11/08/2006  . HYPERTENSION 11/08/2006  . DIABETES MELLITUS, TYPE II 11/08/2006  . HYPERCHOLESTEROLEMIA 07/24/2009  . ANEMIA 07/24/2009  . COPD 11/08/2006    -HFA 50% p coaching 10/15/2009  . RESPIRATORY FAILURE, CHRONIC 10/15/2009  . BACK PAIN, LUMBAR 03/07/2009  . CHEST PAIN 07/24/2009  . Restless leg syndrome     No past surgical history on file.  History   Social History  . Marital Status: Widowed    Spouse Name: N/A    Number of Children: 2  . Years of Education: N/A   Occupational History  . 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 Smoking status: Former Smoker -- 2.0 packs/day for 40 years    Quit date: 01/12/2008  . Smokeless tobacco: Not on file  . Alcohol Use: No  . Drug Use: No  . Sexually Active:    Other Topics Concern  . Not on file   Social History Narrative   Divorced. Lives with dtr.    Current Outpatient Prescriptions on File Prior to Visit  Medication Sig Dispense Refill  . acetaminophen (TYLENOL) 500 MG tablet As directed       . albuterol (PROVENTIL) (2.5 MG/3ML) 0.083% nebulizer solution Take 2.5 mg by nebulization every 6 (six) hours as needed.        . ALPRAZolam (XANAX) 0.25 MG tablet TAKE ONE TABLET BY MOUTH TWICE DAILY AS NEEDED FOR ANXIETY. NOT TO EXCEED 2 PER DAY  60 tablet  0  . aspirin  81 MG tablet Take 81 mg by mouth daily.        Marland Kitchen BENICAR HCT 40-25 MG per tablet TAKE ONE TABLET BY MOUTH EVERY DAY  30 each  3  . calcitRIOL (ROCALTROL) 0.25 MCG capsule Take 1 capsule (0.25 mcg total) by mouth daily.  30 capsule  11  . citalopram (CELEXA) 40 MG tablet TAKE ONE TABLET BY MOUTH EVERY DAY  30 tablet  3  . dextromethorphan-guaiFENesin (MUCINEX DM) 30-600 MG per 12 hr tablet 1-2 tablets by mouth every 12 hours as needed       . diltiazem (CARDIZEM CD) 300 MG 24 hr capsule Take 300 mg by mouth daily.        . furosemide (LASIX) 40 MG tablet TAKE ONE TABLET BY MOUTH EVERY DAY  30 tablet  3  . insulin NPH-insulin regular (RELION 70/30) (70-30) 100 UNIT/ML injection Inject 105 Units into the skin 2 (two) times daily with a meal.       . Insulin Syringe-Needle U-100 (RELION INSULIN SYRINGE 1ML/31G) 31G X 5/16" 1 ML MISC As directed       . Multiple Vitamin (MULTIVITAMIN) tablet Take 1 tablet by mouth daily.        Marland Kitchen  NON FORMULARY Oxygen 4LPM  24/7       . PROAIR HFA 108 (90 BASE) MCG/ACT inhaler INHALE TWO PUFFS BY MOUTH EVERY 4 TO 6 HOURS AS NEEDED  9 g  6  . SPIRIVA HANDIHALER 18 MCG inhalation capsule INHALE ONE DOSE BY MOUTH EVERY DAY  1 each  0  . SYMBICORT 160-4.5 MCG/ACT inhaler INHALE TWO PUFFS BY MOUTH TWICE DAILY  11 g  3    Allergies  Allergen Reactions  . Pioglitazone     REACTION: edema  . Rosiglitazone Maleate     Family History  Problem Relation Age of Onset  . Heart disease Mother   . Pancreatic cancer Mother   . Stroke Mother   . Asthma Paternal Grandfather   . Liver cancer Father   . Diabetes Father     BP 124/60  Pulse 122  Temp(Src) 98.7 F (37.1 C) (Oral)  Ht 5\' 5"  (1.651 m)  Wt 268 lb 12.8 oz (121.927 kg)  BMI 44.73 kg/m2  SpO2 90%  Review of Systems  Constitutional: Negative for fever and unexpected weight change.  HENT: Negative for hearing loss.   Eyes: Negative for visual disturbance.  Respiratory:       No change in chronic sob    Gastrointestinal: Negative for anal bleeding.  Genitourinary: Negative for hematuria.  Neurological: Negative for syncope.  Hematological: Does not bruise/bleed easily.  Psychiatric/Behavioral:       No change in chronic depression  Denies loc and fever    Objective:   Physical Exam GENERAL: no distress.  Obese Skin: no rash Pulses: dorsalis pedis intact bilat.   Feet: no deformity.  no ulcer on the feet.  feet are of normal color and temp.  Trace bilat leg edema.  There are bilat varicosities.  Neuro: sensation is intact to touch on the feet, but decreased from normal  Lab Results  Component Value Date   HGBA1C 7.1* 09/03/2010   Lab Results  Component Value Date   WBC 9.6 09/03/2010   HGB 11.3* 09/03/2010   HCT 35.6* 09/03/2010   MCV 88.6 09/03/2010   PLT 279.0 09/03/2010   Lab Results  Component Value Date   PTH 107.8* 09/03/2010   CALCIUM 9.1 09/03/2010   CAION 1.13 02/13/2009   PHOS 4.2 02/20/2009   Lab Results  Component Value Date   IRON 29* 09/03/2010      Assessment & Plan:  Dm, overcontrolled.  given this regimen, which does match insulin to her changing needs throughout the day Itching, uncertain etiology fe-deficiency anemia, needs increased rx Secondary hyperparathyroidism, well-controlled

## 2010-09-03 NOTE — Patient Instructions (Addendum)
check your blood sugar 2 times a day.  vary the time of day when you check, between before the 3 meals, and at bedtime.  also check if you have symptoms of your blood sugar being too high or too low.  please keep a record of the readings and bring it to your next appointment here.  please call us sooner if you are having low blood sugar episodes.   Please make a "medicare wellness" appointment in 3 months. blood tests are being requested for you today.  please call [phone] to hear your test results.  You will be prompted to enter the 9-digit "MRN" number that appears at the top left of this page, followed by #.  Then you will hear the message. pending the test results, please reduce the insulin to 105 units, 2x a day (with first and last meals of the day).  (update: i left message on phone-tree:  Reduce insulin as we discussed.  Take fe tabs 2/d (update: pt is called.  i have sent a prescription to pt's pharmacy for anti-itch cream)

## 2010-09-04 LAB — IBC PANEL: Saturation Ratios: 8.3 % — ABNORMAL LOW (ref 20.0–50.0)

## 2010-09-04 LAB — PTH, INTACT AND CALCIUM
Calcium, Total (PTH): 9.1 mg/dL (ref 8.4–10.5)
PTH: 107.8 pg/mL — ABNORMAL HIGH (ref 14.0–72.0)

## 2010-09-05 MED ORDER — TRIAMCINOLONE ACETONIDE 0.025 % EX CREA: TOPICAL_CREAM | Freq: Three times a day (TID) | CUTANEOUS | Status: DC

## 2010-09-07 ENCOUNTER — Other Ambulatory Visit: Payer: Self-pay | Admitting: *Deleted

## 2010-09-07 NOTE — Telephone Encounter (Signed)
Left message for pt to callback office.  

## 2010-09-07 NOTE — Telephone Encounter (Signed)
Pt informed medication sent to pharmacy.

## 2010-09-07 NOTE — Telephone Encounter (Signed)
Message copied by Carin Primrose on Mon Sep 07, 2010 11:42 AM ------      Message from: Romero Belling      Created: Sat Sep 05, 2010  4:39 PM       please call patient:      i sent rx for anti-itch skin cream to pharmacy

## 2010-09-07 NOTE — Telephone Encounter (Signed)
Called pt, pt unavailable. Unable to leave message

## 2010-09-11 ENCOUNTER — Ambulatory Visit: Payer: Medicare Other | Admitting: Internal Medicine

## 2010-09-15 ENCOUNTER — Other Ambulatory Visit: Payer: Self-pay | Admitting: Endocrinology

## 2010-09-20 ENCOUNTER — Other Ambulatory Visit: Payer: Self-pay | Admitting: Endocrinology

## 2010-09-21 ENCOUNTER — Inpatient Hospital Stay (HOSPITAL_COMMUNITY)
Admission: EM | Admit: 2010-09-21 | Discharge: 2010-10-02 | DRG: 189 | Disposition: A | Discharge status: Home Health | Payer: Medicare Other | Attending: Internal Medicine | Admitting: Internal Medicine

## 2010-09-21 ENCOUNTER — Emergency Department (HOSPITAL_COMMUNITY): Payer: Medicare Other

## 2010-09-21 DIAGNOSIS — J9620 Acute and chronic respiratory failure, unspecified whether with hypoxia or hypercapnia: Principal | ICD-10-CM | POA: Diagnosis present

## 2010-09-21 DIAGNOSIS — E873 Alkalosis: Secondary | ICD-10-CM | POA: Diagnosis not present

## 2010-09-21 DIAGNOSIS — I498 Other specified cardiac arrhythmias: Secondary | ICD-10-CM | POA: Diagnosis present

## 2010-09-21 DIAGNOSIS — I129 Hypertensive chronic kidney disease with stage 1 through stage 4 chronic kidney disease, or unspecified chronic kidney disease: Secondary | ICD-10-CM | POA: Diagnosis present

## 2010-09-21 DIAGNOSIS — E875 Hyperkalemia: Secondary | ICD-10-CM | POA: Diagnosis not present

## 2010-09-21 DIAGNOSIS — Z9981 Dependence on supplemental oxygen: Secondary | ICD-10-CM

## 2010-09-21 DIAGNOSIS — F17200 Nicotine dependence, unspecified, uncomplicated: Secondary | ICD-10-CM | POA: Diagnosis present

## 2010-09-21 DIAGNOSIS — R791 Abnormal coagulation profile: Secondary | ICD-10-CM | POA: Diagnosis present

## 2010-09-21 DIAGNOSIS — D696 Thrombocytopenia, unspecified: Secondary | ICD-10-CM | POA: Diagnosis not present

## 2010-09-21 DIAGNOSIS — Z794 Long term (current) use of insulin: Secondary | ICD-10-CM

## 2010-09-21 DIAGNOSIS — Z7982 Long term (current) use of aspirin: Secondary | ICD-10-CM

## 2010-09-21 DIAGNOSIS — J984 Other disorders of lung: Secondary | ICD-10-CM | POA: Diagnosis present

## 2010-09-21 DIAGNOSIS — D72829 Elevated white blood cell count, unspecified: Secondary | ICD-10-CM | POA: Diagnosis not present

## 2010-09-21 DIAGNOSIS — N182 Chronic kidney disease, stage 2 (mild): Secondary | ICD-10-CM | POA: Diagnosis present

## 2010-09-21 DIAGNOSIS — J189 Pneumonia, unspecified organism: Secondary | ICD-10-CM | POA: Diagnosis not present

## 2010-09-21 DIAGNOSIS — E662 Morbid (severe) obesity with alveolar hypoventilation: Secondary | ICD-10-CM | POA: Diagnosis present

## 2010-09-21 DIAGNOSIS — R21 Rash and other nonspecific skin eruption: Secondary | ICD-10-CM | POA: Diagnosis present

## 2010-09-21 DIAGNOSIS — F3289 Other specified depressive episodes: Secondary | ICD-10-CM | POA: Diagnosis present

## 2010-09-21 DIAGNOSIS — B373 Candidiasis of vulva and vagina: Secondary | ICD-10-CM | POA: Diagnosis not present

## 2010-09-21 DIAGNOSIS — B3731 Acute candidiasis of vulva and vagina: Secondary | ICD-10-CM | POA: Diagnosis not present

## 2010-09-21 DIAGNOSIS — F329 Major depressive disorder, single episode, unspecified: Secondary | ICD-10-CM | POA: Diagnosis present

## 2010-09-21 DIAGNOSIS — I872 Venous insufficiency (chronic) (peripheral): Secondary | ICD-10-CM | POA: Diagnosis present

## 2010-09-21 DIAGNOSIS — R748 Abnormal levels of other serum enzymes: Secondary | ICD-10-CM | POA: Diagnosis present

## 2010-09-21 DIAGNOSIS — D649 Anemia, unspecified: Secondary | ICD-10-CM | POA: Diagnosis present

## 2010-09-21 DIAGNOSIS — J441 Chronic obstructive pulmonary disease with (acute) exacerbation: Secondary | ICD-10-CM | POA: Diagnosis present

## 2010-09-21 DIAGNOSIS — E119 Type 2 diabetes mellitus without complications: Secondary | ICD-10-CM | POA: Diagnosis present

## 2010-09-21 DIAGNOSIS — Z91199 Patient's noncompliance with other medical treatment and regimen due to unspecified reason: Secondary | ICD-10-CM

## 2010-09-21 DIAGNOSIS — F411 Generalized anxiety disorder: Secondary | ICD-10-CM | POA: Diagnosis present

## 2010-09-21 DIAGNOSIS — Z9119 Patient's noncompliance with other medical treatment and regimen: Secondary | ICD-10-CM

## 2010-09-21 DIAGNOSIS — N179 Acute kidney failure, unspecified: Secondary | ICD-10-CM | POA: Diagnosis not present

## 2010-09-21 DIAGNOSIS — Z66 Do not resuscitate: Secondary | ICD-10-CM | POA: Diagnosis present

## 2010-09-21 DIAGNOSIS — G4733 Obstructive sleep apnea (adult) (pediatric): Secondary | ICD-10-CM | POA: Diagnosis present

## 2010-09-21 DIAGNOSIS — T380X5A Adverse effect of glucocorticoids and synthetic analogues, initial encounter: Secondary | ICD-10-CM | POA: Diagnosis not present

## 2010-09-21 DIAGNOSIS — Z79899 Other long term (current) drug therapy: Secondary | ICD-10-CM

## 2010-09-21 DIAGNOSIS — R5381 Other malaise: Secondary | ICD-10-CM | POA: Diagnosis present

## 2010-09-21 NOTE — Telephone Encounter (Signed)
Please advise 

## 2010-09-21 NOTE — Telephone Encounter (Signed)
Rx for Alprazolam faxed to [facility]

## 2010-09-22 DIAGNOSIS — J9600 Acute respiratory failure, unspecified whether with hypoxia or hypercapnia: Secondary | ICD-10-CM

## 2010-09-22 DIAGNOSIS — J449 Chronic obstructive pulmonary disease, unspecified: Secondary | ICD-10-CM

## 2010-09-22 LAB — GLUCOSE, CAPILLARY: Glucose-Capillary: 202 mg/dL — ABNORMAL HIGH (ref 70–99)

## 2010-09-22 LAB — DIFFERENTIAL
Basophils Absolute: 0 10*3/uL (ref 0.0–0.1)
Basophils Absolute: 0 10*3/uL (ref 0.0–0.1)
Eosinophils Absolute: 0.2 10*3/uL (ref 0.0–0.7)
Lymphocytes Relative: 4 % — ABNORMAL LOW (ref 12–46)
Lymphocytes Relative: 8 % — ABNORMAL LOW (ref 12–46)
Monocytes Absolute: 0.3 10*3/uL (ref 0.1–1.0)
Neutro Abs: 10.1 10*3/uL — ABNORMAL HIGH (ref 1.7–7.7)
Neutro Abs: 6.6 10*3/uL (ref 1.7–7.7)
Neutrophils Relative %: 94 % — ABNORMAL HIGH (ref 43–77)
Neutrophils Relative %: 86 % — ABNORMAL HIGH (ref 43–77)

## 2010-09-22 LAB — CBC
HCT: 39.9 % (ref 36.0–46.0)
HCT: 40.9 % (ref 36.0–46.0)
Hemoglobin: 11.8 g/dL — ABNORMAL LOW (ref 12.0–15.0)
Hemoglobin: 12.3 g/dL (ref 12.0–15.0)
Hemoglobin: 11.6 g/dL — ABNORMAL LOW (ref 12.0–15.0)
MCH: 27.8 pg (ref 26.0–34.0)
MCH: 27.4 pg (ref 26.0–34.0)
MCHC: 30.1 g/dL (ref 30.0–36.0)
MCHC: 29.7 g/dL — ABNORMAL LOW (ref 30.0–36.0)
Platelets: 213 10*3/uL (ref 150–400)
RBC: 4.34 MIL/uL (ref 3.87–5.11)
RDW: 15.7 % — ABNORMAL HIGH (ref 11.5–15.5)
RDW: 15.8 % — ABNORMAL HIGH (ref 11.5–15.5)
WBC: 10.8 10*3/uL — ABNORMAL HIGH (ref 4.0–10.5)

## 2010-09-22 LAB — POTASSIUM
Potassium: 5.4 mEq/L — ABNORMAL HIGH (ref 3.5–5.1)
Potassium: 5.6 mEq/L — ABNORMAL HIGH (ref 3.5–5.1)

## 2010-09-22 LAB — BASIC METABOLIC PANEL
BUN: 22 mg/dL (ref 6–23)
BUN: 26 mg/dL — ABNORMAL HIGH (ref 6–23)
Calcium: 9.3 mg/dL (ref 8.4–10.5)
Chloride: 99 mEq/L (ref 96–112)
GFR calc Af Amer: 58 mL/min — ABNORMAL LOW (ref >60)
GFR calc non Af Amer: 48 mL/min — ABNORMAL LOW (ref >60)
Glucose, Bld: 179 mg/dL — ABNORMAL HIGH (ref 70–99)
Glucose, Bld: 178 mg/dL — ABNORMAL HIGH (ref 70–99)
Potassium: 5 mEq/L (ref 3.5–5.1)
Potassium: 5.4 mEq/L — ABNORMAL HIGH (ref 3.5–5.1)
Sodium: 139 mEq/L (ref 135–145)

## 2010-09-22 LAB — IRON AND TIBC
Saturation Ratios: 7 % — ABNORMAL LOW (ref 20–55)
UIBC: 305 ug/dL (ref 125–400)

## 2010-09-22 LAB — PRO B NATRIURETIC PEPTIDE: Pro B Natriuretic peptide (BNP): 8084 pg/mL — ABNORMAL HIGH (ref 0–125)

## 2010-09-22 LAB — VITAMIN B12: Vitamin B-12: 706 pg/mL (ref 211–911)

## 2010-09-22 LAB — D-DIMER, QUANTITATIVE: D-Dimer, Quant: 0.67 ug/mL-FEU — ABNORMAL HIGH (ref 0.00–0.48)

## 2010-09-22 LAB — OCCULT BLOOD, POC DEVICE: Fecal Occult Bld: POSITIVE

## 2010-09-22 LAB — FERRITIN: Ferritin: 27 ng/mL (ref 10–291)

## 2010-09-22 LAB — FOLATE: Folate: >20 ng/mL

## 2010-09-22 LAB — MRSA PCR SCREENING: MRSA by PCR: NEGATIVE

## 2010-09-23 DIAGNOSIS — R0602 Shortness of breath: Secondary | ICD-10-CM

## 2010-09-23 LAB — BASIC METABOLIC PANEL
BUN: 36 mg/dL — ABNORMAL HIGH (ref 6–23)
Chloride: 95 mEq/L — ABNORMAL LOW (ref 96–112)
GFR calc Af Amer: 48 mL/min — ABNORMAL LOW (ref >60)
GFR calc non Af Amer: 40 mL/min — ABNORMAL LOW (ref >60)
Glucose, Bld: 252 mg/dL — ABNORMAL HIGH (ref 70–99)
Potassium: 5.5 mEq/L — ABNORMAL HIGH (ref 3.5–5.1)
Sodium: 136 mEq/L (ref 135–145)

## 2010-09-23 LAB — GLUCOSE, CAPILLARY
Glucose-Capillary: 230 mg/dL — ABNORMAL HIGH (ref 70–99)
Glucose-Capillary: 138 mg/dL — ABNORMAL HIGH (ref 70–99)
Glucose-Capillary: 159 mg/dL — ABNORMAL HIGH (ref 70–99)
Glucose-Capillary: 163 mg/dL — ABNORMAL HIGH (ref 70–99)
Glucose-Capillary: 170 mg/dL — ABNORMAL HIGH (ref 70–99)

## 2010-09-23 LAB — CBC
HCT: 37.7 % (ref 36.0–46.0)
MCH: 27.9 pg (ref 26.0–34.0)
MCV: 92.2 fL (ref 78.0–100.0)
Platelets: 208 10*3/uL (ref 150–400)
RDW: 15.6 % — ABNORMAL HIGH (ref 11.5–15.5)
WBC: 9.4 10*3/uL (ref 4.0–10.5)

## 2010-09-23 LAB — PRO B NATRIURETIC PEPTIDE: Pro B Natriuretic peptide (BNP): 12965 pg/mL — ABNORMAL HIGH (ref 0–125)

## 2010-09-24 LAB — CBC
HCT: 38.2 % (ref 36.0–46.0)
Hemoglobin: 11.3 g/dL — ABNORMAL LOW (ref 12.0–15.0)
MCH: 27.8 pg (ref 26.0–34.0)
MCHC: 29.6 g/dL — ABNORMAL LOW (ref 30.0–36.0)
MCV: 93.9 fL (ref 78.0–100.0)
RDW: 15.6 % — ABNORMAL HIGH (ref 11.5–15.5)

## 2010-09-24 LAB — BASIC METABOLIC PANEL
BUN: 49 mg/dL — ABNORMAL HIGH (ref 6–23)
Calcium: 8.9 mg/dL (ref 8.4–10.5)
Creatinine, Ser: 1.25 mg/dL — ABNORMAL HIGH (ref 0.50–1.10)
GFR calc Af Amer: 52 mL/min — ABNORMAL LOW (ref >60)
GFR calc non Af Amer: 43 mL/min — ABNORMAL LOW (ref >60)
Glucose, Bld: 174 mg/dL — ABNORMAL HIGH (ref 70–99)

## 2010-09-24 LAB — GLUCOSE, CAPILLARY: Glucose-Capillary: 143 mg/dL — ABNORMAL HIGH (ref 70–99)

## 2010-09-25 ENCOUNTER — Inpatient Hospital Stay (HOSPITAL_COMMUNITY): Payer: Medicare Other

## 2010-09-25 LAB — BASIC METABOLIC PANEL
Chloride: 95 mEq/L — ABNORMAL LOW (ref 96–112)
Creatinine, Ser: 1.36 mg/dL — ABNORMAL HIGH (ref 0.50–1.10)
GFR calc Af Amer: 47 mL/min — ABNORMAL LOW (ref >60)
Sodium: 139 mEq/L (ref 135–145)

## 2010-09-25 LAB — GLUCOSE, CAPILLARY
Glucose-Capillary: 176 mg/dL — ABNORMAL HIGH (ref 70–99)
Glucose-Capillary: 137 mg/dL — ABNORMAL HIGH (ref 70–99)
Glucose-Capillary: 132 mg/dL — ABNORMAL HIGH (ref 70–99)

## 2010-09-25 LAB — CBC
MCV: 93.2 fL (ref 78.0–100.0)
Platelets: 192 10*3/uL (ref 150–400)
RBC: 4.25 MIL/uL (ref 3.87–5.11)
RDW: 15.5 % (ref 11.5–15.5)
WBC: 9.5 10*3/uL (ref 4.0–10.5)

## 2010-09-26 ENCOUNTER — Inpatient Hospital Stay (HOSPITAL_COMMUNITY): Payer: Medicare Other

## 2010-09-26 DIAGNOSIS — J449 Chronic obstructive pulmonary disease, unspecified: Secondary | ICD-10-CM

## 2010-09-26 DIAGNOSIS — J4489 Other specified chronic obstructive pulmonary disease: Secondary | ICD-10-CM

## 2010-09-26 DIAGNOSIS — J9600 Acute respiratory failure, unspecified whether with hypoxia or hypercapnia: Secondary | ICD-10-CM

## 2010-09-26 LAB — BASIC METABOLIC PANEL
CO2: >45 mEq/L (ref 19–32)
Chloride: 91 mEq/L — ABNORMAL LOW (ref 96–112)
Glucose, Bld: 138 mg/dL — ABNORMAL HIGH (ref 70–99)
Potassium: 4.5 mEq/L (ref 3.5–5.1)
Sodium: 140 mEq/L (ref 135–145)

## 2010-09-26 LAB — CBC
MCHC: 29 g/dL — ABNORMAL LOW (ref 30.0–36.0)
RDW: 15.6 % — ABNORMAL HIGH (ref 11.5–15.5)
WBC: 8 10*3/uL (ref 4.0–10.5)

## 2010-09-26 LAB — GLUCOSE, CAPILLARY: Glucose-Capillary: 155 mg/dL — ABNORMAL HIGH (ref 70–99)

## 2010-09-27 DIAGNOSIS — J449 Chronic obstructive pulmonary disease, unspecified: Secondary | ICD-10-CM

## 2010-09-27 DIAGNOSIS — J9600 Acute respiratory failure, unspecified whether with hypoxia or hypercapnia: Secondary | ICD-10-CM

## 2010-09-27 LAB — GLUCOSE, CAPILLARY
Glucose-Capillary: 151 mg/dL — ABNORMAL HIGH (ref 70–99)
Glucose-Capillary: 132 mg/dL — ABNORMAL HIGH (ref 70–99)

## 2010-09-27 LAB — BASIC METABOLIC PANEL
BUN: 64 mg/dL — ABNORMAL HIGH (ref 6–23)
CO2: >45 mEq/L (ref 19–32)
Chloride: 89 mEq/L — ABNORMAL LOW (ref 96–112)
Creatinine, Ser: 1.42 mg/dL — ABNORMAL HIGH (ref 0.50–1.10)
GFR calc Af Amer: 45 mL/min — ABNORMAL LOW (ref >60)
Potassium: 4.5 mEq/L (ref 3.5–5.1)

## 2010-09-27 LAB — PRO B NATRIURETIC PEPTIDE: Pro B Natriuretic peptide (BNP): 1645 pg/mL — ABNORMAL HIGH (ref 0–125)

## 2010-09-28 LAB — GLUCOSE, CAPILLARY
Glucose-Capillary: 124 mg/dL — ABNORMAL HIGH (ref 70–99)
Glucose-Capillary: 137 mg/dL — ABNORMAL HIGH (ref 70–99)
Glucose-Capillary: 155 mg/dL — ABNORMAL HIGH (ref 70–99)
Glucose-Capillary: 132 mg/dL — ABNORMAL HIGH (ref 70–99)

## 2010-09-29 ENCOUNTER — Inpatient Hospital Stay (HOSPITAL_COMMUNITY): Payer: Medicare Other

## 2010-09-29 LAB — BASIC METABOLIC PANEL
BUN: 75 mg/dL — ABNORMAL HIGH (ref 6–23)
Calcium: 9.9 mg/dL (ref 8.4–10.5)
GFR calc Af Amer: 33 mL/min — ABNORMAL LOW (ref >60)
GFR calc non Af Amer: 27 mL/min — ABNORMAL LOW (ref >60)
Glucose, Bld: 126 mg/dL — ABNORMAL HIGH (ref 70–99)
Potassium: 4.5 mEq/L (ref 3.5–5.1)

## 2010-09-29 LAB — GLUCOSE, CAPILLARY: Glucose-Capillary: 167 mg/dL — ABNORMAL HIGH (ref 70–99)

## 2010-09-30 DIAGNOSIS — J9600 Acute respiratory failure, unspecified whether with hypoxia or hypercapnia: Secondary | ICD-10-CM

## 2010-09-30 DIAGNOSIS — J449 Chronic obstructive pulmonary disease, unspecified: Secondary | ICD-10-CM

## 2010-09-30 LAB — GLUCOSE, CAPILLARY
Glucose-Capillary: 134 mg/dL — ABNORMAL HIGH (ref 70–99)
Glucose-Capillary: 147 mg/dL — ABNORMAL HIGH (ref 70–99)
Glucose-Capillary: 130 mg/dL — ABNORMAL HIGH (ref 70–99)

## 2010-09-30 LAB — BASIC METABOLIC PANEL
CO2: 44 mEq/L (ref 19–32)
Chloride: 86 mEq/L — ABNORMAL LOW (ref 96–112)
Creatinine, Ser: 2.04 mg/dL — ABNORMAL HIGH (ref 0.50–1.10)
GFR calc Af Amer: 30 mL/min — ABNORMAL LOW (ref >60)
Potassium: 5.6 mEq/L — ABNORMAL HIGH (ref 3.5–5.1)
Sodium: 136 mEq/L (ref 135–145)

## 2010-09-30 LAB — URINALYSIS, ROUTINE W REFLEX MICROSCOPIC
Bilirubin Urine: NEGATIVE
Nitrite: NEGATIVE
Specific Gravity, Urine: 1.016 (ref 1.005–1.030)
Urobilinogen, UA: 0.2 mg/dL (ref 0.0–1.0)
pH: 6 (ref 5.0–8.0)

## 2010-09-30 LAB — URINE MICROSCOPIC-ADD ON

## 2010-10-01 DIAGNOSIS — J9600 Acute respiratory failure, unspecified whether with hypoxia or hypercapnia: Secondary | ICD-10-CM

## 2010-10-01 DIAGNOSIS — J449 Chronic obstructive pulmonary disease, unspecified: Secondary | ICD-10-CM

## 2010-10-01 LAB — BASIC METABOLIC PANEL
Calcium: 8.9 mg/dL (ref 8.4–10.5)
GFR calc Af Amer: 31 mL/min — ABNORMAL LOW (ref >60)
GFR calc non Af Amer: 25 mL/min — ABNORMAL LOW (ref >60)
Glucose, Bld: 118 mg/dL — ABNORMAL HIGH (ref 70–99)
Potassium: 4.4 mEq/L (ref 3.5–5.1)
Sodium: 135 mEq/L (ref 135–145)

## 2010-10-01 LAB — URINE CULTURE
Culture  Setup Time: 201209200013
Special Requests: NEGATIVE

## 2010-10-01 LAB — GLUCOSE, CAPILLARY
Glucose-Capillary: 150 mg/dL — ABNORMAL HIGH (ref 70–99)
Glucose-Capillary: 195 mg/dL — ABNORMAL HIGH (ref 70–99)

## 2010-10-02 LAB — CBC
HCT: 31.9 % — ABNORMAL LOW (ref 36.0–46.0)
Hemoglobin: 9.7 g/dL — ABNORMAL LOW (ref 12.0–15.0)
MCH: 27.5 pg (ref 26.0–34.0)
MCHC: 30.4 g/dL (ref 30.0–36.0)
RBC: 3.53 MIL/uL — ABNORMAL LOW (ref 3.87–5.11)

## 2010-10-02 LAB — GLUCOSE, CAPILLARY
Glucose-Capillary: 129 mg/dL — ABNORMAL HIGH (ref 70–99)
Glucose-Capillary: 138 mg/dL — ABNORMAL HIGH (ref 70–99)
Glucose-Capillary: 119 mg/dL — ABNORMAL HIGH (ref 70–99)

## 2010-10-02 LAB — BASIC METABOLIC PANEL
BUN: 70 mg/dL — ABNORMAL HIGH (ref 6–23)
CO2: 43 mEq/L (ref 19–32)
Calcium: 9.1 mg/dL (ref 8.4–10.5)
Glucose, Bld: 146 mg/dL — ABNORMAL HIGH (ref 70–99)
Potassium: 4.4 mEq/L (ref 3.5–5.1)
Sodium: 136 mEq/L (ref 135–145)

## 2010-10-05 ENCOUNTER — Inpatient Hospital Stay: Payer: Medicare Other | Admitting: Adult Health

## 2010-10-06 LAB — DIFFERENTIAL
Lymphocytes Relative: 10 — ABNORMAL LOW
Lymphs Abs: 1.3
Monocytes Relative: 4
Neutro Abs: 10.2 — ABNORMAL HIGH
Neutrophils Relative %: 84 — ABNORMAL HIGH

## 2010-10-06 LAB — CARDIAC PANEL(CRET KIN+CKTOT+MB+TROPI)
CK, MB: 4
Relative Index: INVALID
Relative Index: INVALID
Total CK: 58
Total CK: 67

## 2010-10-06 LAB — CBC
HCT: 38.9
Hemoglobin: 13.1
Hemoglobin: 15.5 — ABNORMAL HIGH
Platelets: 235
RBC: 5.06
RDW: 16 — ABNORMAL HIGH
WBC: 11.5 — ABNORMAL HIGH
WBC: 12.1 — ABNORMAL HIGH

## 2010-10-06 LAB — CULTURE, BLOOD (ROUTINE X 2): Culture: NO GROWTH

## 2010-10-06 LAB — MISCELLANEOUS TEST

## 2010-10-06 LAB — HEMOGLOBIN A1C: Mean Plasma Glucose: 389

## 2010-10-06 LAB — LEGIONELLA ANTIGEN, URINE

## 2010-10-06 LAB — BASIC METABOLIC PANEL
Calcium: 8.1 — ABNORMAL LOW
Calcium: 8.7
Creatinine, Ser: 0.83
GFR calc Af Amer: >60
GFR calc Af Amer: >60
GFR calc non Af Amer: >60
Glucose, Bld: 226 — ABNORMAL HIGH
Potassium: 3.6
Sodium: 133 — ABNORMAL LOW
Sodium: 134 — ABNORMAL LOW

## 2010-10-06 LAB — MAGNESIUM: Magnesium: 1.9

## 2010-10-06 LAB — STREP PNEUMONIAE URINARY ANTIGEN: Strep Pneumo Urinary Antigen: NEGATIVE

## 2010-10-06 LAB — POTASSIUM: Potassium: 3.1 — ABNORMAL LOW

## 2010-10-06 LAB — CULTURE, RESPIRATORY W GRAM STAIN

## 2010-10-08 NOTE — Discharge Summary (Signed)
[name], ROMBERGER                 ACCOUNT NO.:  [id number]  MEDICAL RECORD NO.:  [id number]  LOCATION:  1502                         FACILITY:  [facility]  PHYSICIAN:  Elliot Cousin, M.D.    DATE OF BIRTH:  [birth year]  DATE OF ADMISSION:  09/21/2010 DATE OF DISCHARGE:  10/02/2010                              DISCHARGE SUMMARY   DISCHARGE DIAGNOSES: 1. 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 2. Compensatory alkalosis. 3. Oxygen-dependent chronic obstructive pulmonary disease. 4. Stable right apical spiculated mass since 2006, consistent with     scarring, per CT scan of the chest without contrast on September 29, 2010. 5. History of ventilator-dependent respiratory failure in February     2011. 6. Interstitial pulmonary edema with elevated BNP. 7. Echocardiogram with bubble study was technically very limited and     therefore the results were not diagnostic. 8. Hyperkalemia, secondary to Benicar and possibly acute renal     failure. 9. Acute renal failure, superimposed on stage 1 or stage 2 chronic     kidney disease.  The patient's BUN was 70 and her creatinine was     1.92 upon discharge. 10.Hypertension. 11.Type 2 diabetes mellitus.  Her hemoglobin A1c was 7.0. 12.Anemia and thrombocytopenia.  Her hemoglobin was 9.7 at the time of     discharge.  Her platelet count was 140 at the time of discharge.     Her anemia panel revealed a total iron of 24, TIBC of 329, vitamin     B12 of 706, folate of greater than 20, and ferritin of 27. 13.Tobacco abuse. 14.Morbid obesity. 15.Depression. 16.Vulvovaginitis, likely secondary to yeast. 17.Deconditioning.  DISCHARGE MEDICATIONS: 1. Clonidine 0.1 mg 3 times daily. 2. Famotidine 20 mg nightly. 3. Guaifenesin (Mucinex) 600 mg twice daily as needed for cough and     chest congestion. 4. Monistat vaginal cream 1 application nightly at bedtime for 4 more     days. 5. Oxygen 4-6 L per minute 24 hours daily. 6.  70/30 insulin 55 units twice daily, decreased from 105 units twice     daily.  The patient was advised to increase the dose back to 105     units twice daily if her capillary blood glucose at home was     consistently above 250. 7. Albuterol nebulization 1 nebulization every 4 hours as needed for     shortness of breath. 8. Alprazolam 0.25 mg b.i.d. as needed for anxiety. 9. Aspirin 81 mg daily. 10.Calcitriol 1 capsule daily. 11.Celexa 40 mg daily. 12.Over-the-counter iron supplement 1 tablet daily. 13.Furosemide 40 mg daily. 14.Multivitamin 1 tablet daily. 15.ProAir inhaler 90 mcg 2 puffs every 4 hours as needed for shortness     of breath. 16.Spiriva 18 mcg 1 capsule inhaled daily. 17.Symbicort 160/4.5 mcg 2 puffs b.i.d. 18.Stop Benicar.  DISCHARGE DISPOSITION:  The patient was discharged home in improved and stable condition on October 02, 2010.  She has an appointment to follow up with her pulmonologist, Dr. Sherene [name], on October 05, 2010, and with her primary care physician, Dr. Romero [name], on October 07, 2010.  CONSULTATIONS: 1. Michael B. [name] Sires, MD, FCCP 2. Leslye Peer, MD  PROCEDURES PERFORMED: 1. CT scan of the chest without contrast on September 29, 2010.  The     results revealed bilateral lower lobe predominant acute     viral/atypical pneumonia, dominant feature is tree-in-bud     nodularity.  Stable right apical spiculated lesion since 2006,     consistent with scarring.  Chronic hepatic steatosis. 2. Chest x-ray on September 29, 2010.  The results revealed no active     cardiopulmonary disease. 3. Chest x-ray on September 26, 2010.  The results revealed no     definite acute cardiopulmonary disease. 4. 2-D echocardiogram on September 23, 2010.  Technically very limited     exam.  HISTORY OF PRESENTING ILLNESS:  The patient is a 65 year old woman with a past medical history significant for oxygen-dependent COPD, noncompliance, and ongoing tobacco use  who presented to the emergency department on September 21, 2010, with a chief complaint of shortness of breath.  In the emergency department, she was found to be mildly hypertensive and tachycardic with a heart rate of 134 beats per minute. She was afebrile.  She was given several albuterol nebulizations.  She was placed on 100% non-rebreather mask when her oxygen saturations on nasal cannula oxygen fell to the 80s.  Her chest x-ray revealed vascular congestion and mild increased interstitial markings, likely reflecting very mild interstitial edema.  She was given 80 mg of Lasix by the emergency department physician.  She was admitted for further evaluation and management.  HOSPITAL COURSE: 1. 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  The patient was started on     treatment with Levaquin, Solu-Medrol, albuterol, and Atrovent     nebulizations, and oxygen titrated to keep her oxygen saturations     at 90% or above.  The patient is chronically treated with 4 L of     nasal cannula oxygen.  She was advised to stop smoking     consistently.  Tobacco cessation counseling was ordered.  In     addition, Symbicort was continued at 2 puffs b.i.d.  Following the     80 mg of Lasix in the emergency department, she was subsequently     started on 40 mg of Lasix IV twice daily.  Pulmonologist, Dr. Sherene [name],     was consulted.  She was placed on BiPAP per his recommendations.     Benicar was discontinued when her serum potassium increased.     Because of the interstitial edema, Dr. Sherene [name] ordered a 2-D     echocardiogram.  However, the 2-D echocardiogram was very limited     and there were essentially no diagnostic results.  Zithromax was     eventually started for additional coverage of atypical pneumonia.     Two grams of magnesium sulfate was given empirically by the      pulmonary team.  A CT scan of her chest was ordered to follow up on     a known right lung mass.  The CT scan which was noncontrasted,     revealed the stable spiculated mass, consistent with scarring.  Her     D-dimer was elevated, however, the elevation was thought to be     secondary to the inflammatory response from COPD and respiratory     distress.  She improved symptomatically and clinically.  She was     transferred out of the  ICU to a regular bed.  An attempt was made     to acquire BiPAP at bedtime for the patient prior to discharge.     However, according to the case manager, she did not meet criteria     for BiPAP at home.  Of note, in my review of the laboratory results     during the hospitalization, an ABG apparently was not ordered.  An     outpatient ABG can be ordered, however,     this will be deferred to her pulmonologist, Dr. Sherene [name].  The Solu-     Medrol was essentially tapered off.  She completed an 11-day course     of antibiotics and therefore they were discontinued upon discharge.     Her oxygen saturations ranged from 91% to 93% on 4-6 L of nasal     cannula oxygen at the time of discharge.  She was in no acute     respiratory distress and symptomatically improved at the time of     discharge. 2. Hyperkalemia and acute renal failure.  At the time of the initial     assessment, the patient's serum potassium was 5.0 and her     creatinine was 1.07.  As above, she was started on intravenous     Lasix.  Her renal function did worsen.  With the worsening of her     renal function, her serum potassium increased to a high of 5.6.     She was given Kayexalate.  Her BUN and creatinine progressively     increased.  Intravenous Lasix was discontinued.  She was restarted     on her home dose of 40 mg p.o. daily.  Benicar was discontinued.     Prior to discharge, her BUN was 70 and her creatinine was 1.92.     Her renal function will need to be followed in the outpatient      setting.  Her serum potassium improved to 4.4 prior to discharge. 3. Hypertension.  Benicar was discontinued secondary to acute renal     failure.  She was subsequently started on clonidine.  She was     started on clonidine 0.1 mg 3 times daily.  Her blood pressure was     well controlled during the last few days of the hospitalization. 4. Type 2 diabetes mellitus.  70/30 insulin was discontinued in favor     of sliding scale NovoLog and Lantus when her capillary blood     glucose began to decrease rapidly as the Solu-Medrol was tapered     off.  Her hemoglobin A1c was noted to be 7.0.  Her glycemic control     was nearly excellent during the last few days of the     hospitalization.  Upon discharge, the patient was advised to     decrease her home dose of 70/30 insulin to approximately half.  She     was also instructed to increase the 70/30 insulin back to her     previous home dose if her capillary blood glucose progressively     increased to above 250 consistently.  She voiced understanding. 5. Anemia and thrombocytopenia.  The patient's hemoglobin was 11.8 at     the time of admission.  It was 9.7 at the time of discharge.  Her     platelet count was 206 on admission and 140 at the time of     discharge.  An anemia panel was ordered.  The results were dictated     above.  She was maintained on multivitamin with iron daily and iron     supplement daily.  There was no obvious evidence of GI or GU     bleeding.  She was given DVT prophylaxis with subcutaneous heparin.     Elliot Cousin, M.D.     DF/MEDQ  D:  10/02/2010  T:  10/02/2010  Job:  [id number]  cc:   [name]. [name] Sires, MD, FCCP [address] [place] [address]  Gregary Signs A. Everardo All, MD [address] [place] [address]  Electronically Signed by Elliot Cousin M.D. on 10/08/2010 09:12:07 AM

## 2010-10-12 ENCOUNTER — Telehealth: Payer: Self-pay | Admitting: *Deleted

## 2010-10-12 LAB — PROTIME-INR
INR: 1.1
Prothrombin Time: 14.1

## 2010-10-12 LAB — CBC
HCT: 42.2
Hemoglobin: 14.2
MCHC: 33
MCV: 91.1
Platelets: 258
RBC: 4.72
RDW: 15.9 — ABNORMAL HIGH
RDW: 17.4 — ABNORMAL HIGH

## 2010-10-12 LAB — GLUCOSE, CAPILLARY
Glucose-Capillary: 112 — ABNORMAL HIGH
Glucose-Capillary: 136 — ABNORMAL HIGH
Glucose-Capillary: 145 — ABNORMAL HIGH
Glucose-Capillary: 147 — ABNORMAL HIGH
Glucose-Capillary: 165 — ABNORMAL HIGH
Glucose-Capillary: 175 — ABNORMAL HIGH
Glucose-Capillary: 207 — ABNORMAL HIGH
Glucose-Capillary: 232 — ABNORMAL HIGH
Glucose-Capillary: 240 — ABNORMAL HIGH
Glucose-Capillary: 292 — ABNORMAL HIGH

## 2010-10-12 LAB — POCT I-STAT, CHEM 8
Calcium, Ion: 1.12
Creatinine, Ser: 1.3 — ABNORMAL HIGH
Glucose, Bld: 183 — ABNORMAL HIGH
HCT: 46
Hemoglobin: 15.6 — ABNORMAL HIGH
TCO2: 26

## 2010-10-12 LAB — COMPREHENSIVE METABOLIC PANEL
AST: 29
Albumin: 3.1 — ABNORMAL LOW
Alkaline Phosphatase: 53
BUN: 31 — ABNORMAL HIGH
GFR calc Af Amer: >60
Potassium: 4.3
Total Protein: 7.9

## 2010-10-12 LAB — CK TOTAL AND CKMB (NOT AT ARMC)
CK, MB: 11.7 — ABNORMAL HIGH
CK, MB: 7.6 — ABNORMAL HIGH
Relative Index: 3.9 — ABNORMAL HIGH
Total CK: 183 — ABNORMAL HIGH
Total CK: 197 — ABNORMAL HIGH
Total CK: 238 — ABNORMAL HIGH

## 2010-10-12 LAB — DIFFERENTIAL
Basophils Absolute: 0.3 — ABNORMAL HIGH
Basophils Relative: 2 — ABNORMAL HIGH
Eosinophils Absolute: 0
Lymphocytes Relative: 5 — ABNORMAL LOW
Monocytes Absolute: 0.2
Monocytes Absolute: 0.7
Monocytes Relative: 2 — ABNORMAL LOW
Monocytes Relative: 5
Neutro Abs: 11.7 — ABNORMAL HIGH

## 2010-10-12 LAB — LIPID PANEL
Cholesterol: 120
HDL: 28 — ABNORMAL LOW
LDL Cholesterol: 75
Total CHOL/HDL Ratio: 4.3
Triglycerides: 83

## 2010-10-12 LAB — BLOOD GAS, VENOUS
Acid-base deficit: 0.3
Bicarbonate: 26.8 — ABNORMAL HIGH
TCO2: 24
pCO2, Ven: 55.5 — ABNORMAL HIGH
pH, Ven: 7.304 — ABNORMAL HIGH
pO2, Ven: 55.6 — ABNORMAL HIGH

## 2010-10-12 LAB — TSH: TSH: 0.278 — ABNORMAL LOW

## 2010-10-12 LAB — D-DIMER, QUANTITATIVE: D-Dimer, Quant: 0.67 — ABNORMAL HIGH

## 2010-10-12 LAB — TROPONIN I
Troponin I: 0.03
Troponin I: 0.04

## 2010-10-12 NOTE — Telephone Encounter (Signed)
Ov tomorrow 

## 2010-10-12 NOTE — Telephone Encounter (Signed)
Spoke w/pt's physical therapist. Pt's BP today at home was 180/90, no symptoms. She had been out of BP med x 2 days and restarted today.  Therapist needs BP parameters order from MD faxed to office. Fax # [phone].

## 2010-10-13 NOTE — Telephone Encounter (Signed)
Left message with Genevieve Norlander PT regarding MD's advisement. Left message for pt to callback office to schedule OV.

## 2010-10-13 NOTE — Telephone Encounter (Signed)
Left message for pt to callback office.  

## 2010-10-14 NOTE — H&P (Signed)
[name], [name]                 ACCOUNT NO.:  [id number]  MEDICAL RECORD NO.:  [id number]  LOCATION:  WLED                         FACILITY:  [facility]  PHYSICIAN:  Carlota Raspberry, MD         DATE OF BIRTH:  March 19, 1945  DATE OF ADMISSION:  09/21/2010 DATE OF DISCHARGE:                             HISTORY & PHYSICAL   PRIMARY CARE PHYSICIAN:  Sean A. Everardo All, MD, with [facility].  CHIEF COMPLAINT:  Dyspnea.  HISTORY OF PRESENT ILLNESS:  This is a 65 year old lady with COPD with a history of intubation in February 2011, on home 4 L of oxygen, also with diabetes, morbid obesity who presents with dyspnea.  The patient reports that about 4-5 days ago she started having a lot of sneezing and coughing and has progressively been coughing up thick green sputum.  She has been having fever for the last 3 days and reports taking her temperature was 101 at home also associated with a lot of sweating the day before yesterday and today.  This is associated with some chest and upper back pain and difficulty moving air and feeling like she cannot get air in.  She has also been having throat congestion and lung congestion.  She did not see her doctor for this, but instead things have come to a climax tonight when she came to the emergency room to try to get some antibiotics and help with her difficulty breathing.  In the emergency room, she was seen to be hemodynamically stable at 152/62, pulse 134, respirations 18, and temperature 98.7.  She was started upon oxygen and is currently doing 100% on non-rebreather mask, but per discussion with her nurse she has been desaturating through her course in the ED.  She stated she went down to 80% when she was getting neb treatment and then was seen to be 79% on a Ventimask and is now put on a non-rebreather and is saturating well with that.  Otherwise through her course in the ED, she has gotten a BNP, which is elevated at 8084.  D-dimer which is  0.67, negative troponin, a normal chemistry panel, and a CBC that is a bit elevated at 10.8.  Her chest x- ray was shown vascular congestion and mild cardiomegaly with mildly increased interstitial markings called as very mild interstitial edema, for which she has gotten 80 mg of IV Lasix, but not a lot of urine output.  REVIEW OF SYSTEMS:  As above, otherwise positive for some nausea when she coughs so hard, but no abdominal pain and no other specific complaints at present.  Regarding her exercise tolerance, she uses oxygen at home and is usually only able to walk 50-100 feet, i.e., walking in the [place] where she then gets into a motorized cart.  She is unable to do one flight of stairs and has difficulty doing just 4 steps into her house.  This is all due to her problems with breathing and not due to any angina.  PAST MEDICAL HISTORY: 1. COPD with a history of intubation in February 2011, on home 4 L of     O2. 2. Diabetes mellitus, on insulin. 3.  Hypertension. 4. Morbid obesity. 5. Depression. 6. Right pulmonary nodule.HOME MEDICATION:  List is reconciled with the patient from her own list and includes, 1. Alprazolam. 2. Acetaminophen. 3. Albuterol sulfate. 4. Nebs and inhalers. 5. Aspirin 81. 6. Budesonide/formoterol inhalers. 7. Calcitriol. 8. Citalopram. 9. Mucinex. 10.Furosemide 40 mg daily. 11.Insulin 105 units b.i.d. 12.70/30 Novolin. 13.Multiple vitamin. 14.Olmesartan/HCTZ 40/25. 15.Spiriva HandiHaler.  ALLERGIES:  Noted are to PIOGLITAZONE and ROSIGLITAZONE.  SOCIAL HISTORY:  She lives at home with her daughter and 3 grandchildren.  She has a significant smoking history and has even been smoking up until a couple of weeks ago and was smoking less than half a pack per day.  She does not drink alcohol and denies any drugs.  FAMILY HISTORY:  Her mother had hypertension, pancreatic cancer, and a CVA.  Her father had liver cancer, acute renal failure, and  diabetes. She denies any history of MIs or HF.  PHYSICAL EXAMINATION:  VITAL SIGNS:  She is 100% on non-rebreather. Blood pressures are 103 to 152 over 56 to 66 with a pulse of 124.  She is breathing about 25 times per minute. GENERAL:  She is an extremely large lady with a non-rebreather mask on sitting at the bedside commode and has just had a bowel movement making the entire ED room smell of feces.  She has a friend of hers at the bedside.  She is able to speak in full sentences and although she has some minimal increase work of breathing, is not in any acute respiratory distress. HEENT:  Her pupils are equal, round, reactive to light, and accommodation.  Her extraocular muscles are intact.  Her sclerae clear. Her mouth is moist and normal appearing.  She does appear to have some thrush on her hard palate, but otherwise her oropharynx is clear. LUNGS:  Very diffusely expiratory wheezy.  They sound very tight with inspiratory coarse breath sounds and expiratory tight squeaks. HEART:  Regular rate and rhythm without any murmurs or gallops appreciated.  It is tachycardic though. ABDOMEN:  Extremely obese and is soft, nontender, nondistended, and completely benign. EXTREMITIES:  Warm, fairly well perfused.  Her bilateral lower extremities do exhibit some hard pitting edema up to about the mid shin and she has right greater than left erythema and venous stasis dermatitis with some scabbed over ulcers as well.  LABORATORY DATA:  Her white blood cell count is 10.8, hematocrit 39.9, platelets 206.  Her D-dimer is 0.67.  Her chemistry is normal except K 5.0, CO2 of 33, renal 22 and 1.07 and is at its baseline.  Her troponin is negative.  Chest x-ray shows vascular congestion, mild cardiomegaly with mildly increased interstitial markings, likely reflecting very mild interstitial edema.  Echo in 2007 was grossly normal, but very difficult due to acoustic windows and unable to evaluate  her at LV systolic function, but there was no gross valvular disease noted.  EKG is sinus tachycardia with a horizontal axis.  Her P waves appear grossly normal.  Her QRS also appear grossly normal with late R-wave transition between V4 and V5 .  Her ST-T-wave segments are all grossly normal and her T-waves are all also grossly normal.  This is overall fairly unremarkable EKG other than tachycardia.  IMPRESSION:  This is a 65 year old female with severe end-stage chronic obstructive pulmonary disease, on home 4 liters O2, and a history of an intubation who presents with dyspnea and respiratory distress in the setting of sneezing, coughing up productive sputum, and fever.  All suggestive of an acute-on-chronic chronic obstructive pulmonary disease exacerbation. 1. Acute chronic obstructive pulmonary disease.  I think that she had     a respiratory viral infection that on the baseline of poor lung     function and is now in acute chronic obstructive pulmonary disease,     for which we will continue to give her scheduled nebs.  We will     start her on some antibiotics and also give her some IV steroids to     see her through it.  She is currently stable, saturating at 100% on     a non-rebreather, but does desat quite easily and therefore was     admitted to the step-down unit.  She does not want to be intubated,     should she continue to further decompensate, but I did not     specifically discussed BiPAP 2. She was also extensively urged that she needs to quit smoking for     good.  In the differential, considered were heart failure     exacerbation, given that she has a little bit of mild interstitial     edema.  We do not know what her cardiac status is at present and     with her habitus, I would amazed if she did not actually have any     diastolic dysfunction.  She did not put out very well to 80 mg of     IV Lasix and therefore we will continue to give her IV diuresis to      try to reduce her pulmonary pressures.  She takes 20 mg at home, so     I am not sure why they started 80 mg IV.  I think a more reasonable     course would be 40 mg of IV b.i.d.  Again, I want to reiterate that     I think this is most likely a chronic obstructive pulmonary disease     exacerbation and not primarily HF, but I think that she would     benefit from being, but diuresed. 3. Diabetes.  We will continue her home insulin regimen. 4. We will continue her home antihypertensive regimen. 5. Elevated D-dimer, despite her tachycardia and hypoxia, we have a     much more reasonable plausible explanation and therefore we will     not CTA her at this point.  It is really only minimally elevated     any ways. 6. Fluid, electrolytes, and nutrition.  She can get a regular, heart-     healthy diabetic diet.  We will not give her any IV fluids, but     diuresed her instead. 7. IV access.  She has a peripheral IV. 8. Prophylaxis.  We will give her subcutaneous heparin, a bowel     regimen, Tylenol for fever and pain.  CODE STATUS:  She has expressed her desire to be DNR/DNI and I think that this is completely appropriate given her end-stage COPD.  The patient will be admitted to the step-down unit to Team 1.          ______________________________ Carlota Raspberry, MD    EB/MEDQ  D:  09/22/2010  T:  09/22/2010  Job:  [id number] Electronically Signed by Carlota Raspberry MD on 10/14/2010 12:18:46 PM

## 2010-10-14 NOTE — Telephone Encounter (Signed)
Unable to leave message (line busy) on cell #, pt has appointment scheduled for tomorrow, closing phone note.

## 2010-10-15 ENCOUNTER — Other Ambulatory Visit: Payer: Medicare Other

## 2010-10-15 ENCOUNTER — Encounter: Payer: Self-pay | Admitting: Endocrinology

## 2010-10-15 ENCOUNTER — Ambulatory Visit (INDEPENDENT_AMBULATORY_CARE_PROVIDER_SITE_OTHER): Payer: Medicare Other | Admitting: Adult Health

## 2010-10-15 ENCOUNTER — Encounter: Payer: Self-pay | Admitting: Adult Health

## 2010-10-15 ENCOUNTER — Ambulatory Visit (INDEPENDENT_AMBULATORY_CARE_PROVIDER_SITE_OTHER): Payer: Medicare Other | Admitting: Endocrinology

## 2010-10-15 VITALS — BP 128/68 | HR 86 | Temp 98.8°F | Ht 64.0 in | Wt 274.0 lb

## 2010-10-15 DIAGNOSIS — J9610 Chronic respiratory failure, unspecified whether with hypoxia or hypercapnia: Secondary | ICD-10-CM

## 2010-10-15 DIAGNOSIS — E119 Type 2 diabetes mellitus without complications: Secondary | ICD-10-CM

## 2010-10-15 DIAGNOSIS — N390 Urinary tract infection, site not specified: Secondary | ICD-10-CM

## 2010-10-15 DIAGNOSIS — N19 Unspecified kidney failure: Secondary | ICD-10-CM | POA: Insufficient documentation

## 2010-10-15 DIAGNOSIS — IMO0002 Reserved for concepts with insufficient information to code with codable children: Secondary | ICD-10-CM | POA: Insufficient documentation

## 2010-10-15 DIAGNOSIS — R300 Dysuria: Secondary | ICD-10-CM

## 2010-10-15 DIAGNOSIS — R609 Edema, unspecified: Secondary | ICD-10-CM

## 2010-10-15 LAB — POCT URINALYSIS DIPSTICK
Bilirubin, UA: NEGATIVE
Ketones, UA: NEGATIVE
Spec Grav, UA: 1.015

## 2010-10-15 MED ORDER — CIPROFLOXACIN HCL 250 MG PO TABS: 250.0000 mg | ORAL_TABLET | Freq: Two times a day (BID) | ORAL | Status: AC

## 2010-10-15 NOTE — Patient Instructions (Signed)
Continue on Symbicort and Spiriva  follow up Dr. Sherene Sires  In 2-3 weeks.  Consider BIPAP and will discuss on return office visit.

## 2010-10-15 NOTE — Assessment & Plan Note (Signed)
Acute on chronic hypercarbic and hypoxic resp failure w/ recent flare w/ vol overload Now improving  Consider nocturnal BIPAP , to discuss on return with Dr. Sherene Sires   Cont on current regimen  follow up 2 weeks Dr. Sherene Sires

## 2010-10-15 NOTE — Progress Notes (Signed)
Subjective:    Patient ID: [name], female    DOB: 22-Jun-1945, 65 y.o.   MRN: [id number]  HPI Pt states few weeks of slight swelling throughout her body, but worst at the legs. she as recently in the hospital for resp failure, and insulin had to be reduced to 55 units bid. brings a record of her cbg's which i have reviewed today.  It varies from 96-200.  There is no trend throughout the day.  She takes the 55 units bid, that she was rx'ed at hosp d/c. She has chronic itching of the legs. She has a few days of dysuria Past Medical History  Diagnosis Date  . RENAL INSUFFICIENCY 10/15/2008  . Edema 06/18/2008  . PNEUMONIA 12/01/2007  . CARPAL TUNNEL SYNDROME, BILATERAL 10/30/2007  . HAND PAIN, LEFT 09/04/2007  . Morbid obesity   . ANXIETY 11/08/2006  . HYPERTENSION 11/08/2006  . DIABETES MELLITUS, TYPE II 11/08/2006  . HYPERCHOLESTEROLEMIA 07/24/2009  . ANEMIA 07/24/2009  . COPD 11/08/2006    -HFA 50% p coaching 10/15/2009  . RESPIRATORY FAILURE, CHRONIC 10/15/2009  . BACK PAIN, LUMBAR 03/07/2009  . CHEST PAIN 07/24/2009  . Restless leg syndrome     No past surgical history on file.  History   Social History  . Marital Status: Widowed    Spouse Name: N/A    Number of Children: 2  . Years of Education: N/A   Occupational History  . 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 Smoking status: Former Smoker -- 2.0 packs/day for 40 years    Quit date: 01/12/2008  . Smokeless tobacco: Not on file  . Alcohol Use: No  . Drug Use: No  . Sexually Active:    Other Topics Concern  . Not on file   Social History Narrative   Divorced. Lives with dtr.    Current Outpatient Prescriptions on File Prior to Visit  Medication Sig Dispense Refill  . albuterol (PROVENTIL) (2.5 MG/3ML) 0.083% nebulizer solution Take 2.5 mg by nebulization every 6 (six) hours as needed.        . ALPRAZolam (XANAX) 0.25 MG tablet TAKE ONE TABLET BY MOUTH TWICE DAILY AS NEEDED  FOR ANXIETY. NOT  TO  EXCEED  2  PER  DAY  60 tablet  2  . aspirin 81 MG tablet Take 81 mg by mouth daily.        . calcitRIOL (ROCALTROL) 0.25 MCG capsule Take 1 capsule (0.25 mcg total) by mouth daily.  30 capsule  11  . citalopram (CELEXA) 40 MG tablet TAKE ONE TABLET BY MOUTH EVERY DAY  30 tablet  3  . dextromethorphan-guaiFENesin (MUCINEX DM) 30-600 MG per 12 hr tablet 1-2 tablets by mouth every 12 hours as needed       . furosemide (LASIX) 40 MG tablet TAKE ONE TABLET BY MOUTH EVERY DAY  30 tablet  3  . Insulin Syringe-Needle U-100 (RELION INSULIN SYRINGE 1ML/31G) 31G X 5/16" 1 ML MISC As directed       . Multiple Vitamin (MULTIVITAMIN) tablet Take 1 tablet by mouth daily.        . NON FORMULARY Oxygen 4LPM  24/7       . PROAIR HFA 108 (90 BASE) MCG/ACT inhaler INHALE TWO PUFFS BY MOUTH EVERY 4 TO 6 HOURS AS NEEDED  9 g  6  . SPIRIVA HANDIHALER 18 MCG inhalation capsule INHALE ONE DOSE BY MOUTH EVERY DAY  30 each  3  . SYMBICORT 160-4.5 MCG/ACT inhaler INHALE TWO PUFFS BY MOUTH TWICE DAILY  11 g  3  . triamcinolone (KENALOG) 0.025 % cream Apply topically 3 (three) times daily. As needed for itching  30 g  0  . acetaminophen (TYLENOL) 500 MG tablet As directed       . BENICAR HCT 40-25 MG per tablet TAKE ONE TABLET BY MOUTH EVERY DAY  30 each  3  . diltiazem (CARDIZEM CD) 300 MG 24 hr capsule Take 300 mg by mouth daily.          Allergies  Allergen Reactions  . Pioglitazone     REACTION: edema  . Rosiglitazone Maleate     Family History  Problem Relation Age of Onset  . Heart disease Mother   . Pancreatic cancer Mother   . Stroke Mother   . Asthma Paternal Grandfather   . Liver cancer Father   . Diabetes Father     BP 128/68  Pulse 86  Temp(Src) 98.8 F (37.1 C) (Oral)  Ht 5\' 4"  (1.626 m)  Wt 274 lb (124.286 kg)  BMI 47.03 kg/m2  SpO2 96%  Review of Systems denies hypoglycemia and fever    Objective:   Physical Exam VITAL SIGNS:  See vs page GENERAL: no  distress.  Has 02 on Pulses: dorsalis pedis intact bilat.   Feet: no deformity.  no ulcer on the feet.  feet are of normal color and temp.  1+ bilat leg edema.  There is bilateral onychomycosis, and dry skin on the feet.   Neuro: sensation is intact to touch on the feet Skin: few red macules on the feet.      (i reviewed hosp labs)    Assessment & Plan:  Dm.  Insulin requirement is decreased due to acute illness. Renal failure, worse Edema.  She seems volume-sensitive. Chronic itching. ? uti

## 2010-10-15 NOTE — Progress Notes (Signed)
Subjective:    Patient ID: [name], female    DOB: 09/23/1945, 65 y.o.   MRN: [id number]  HPI 46 yowf with morbid obesity/ aodm who quit smoking 2010 with known hx of COPD- O2 dependent at time she quit.   March 07, 2009-- Last seen in 2009. She presents for post hosp follow up -  Admitted 2/3-2/15 for Acute exacerbation of chronic obstructive pulmonary disease, community-acquired pneumonia, and acute respiratory failure, requiring intubation. Found unresponsive at home O2 was not working at home , O2 sat found to 60%. She was treated with IV steroids, IV antibiotics, and nebulized bronchodilators. Extubated February 18, 2009. Steroids weaned down to 10mg  daily > weaned off with no flare   October 15, 2009 Acute visit. Pt c/o increased SOB x 2 wks. She states that she is only SOB with exertion and sometimes gets out of breath just walking from room to room. She also c/o prod cough with green sputum x 2 wks. usually use proaire once a day but now needing every 4 hours. doe even on 02 @ 24 hours per day. Pt denies any significant sore throat, dysphagia, itching, sneezing, nasal congestion or excess secretions, fever, chills, sweats, unintended wt loss, pleuritic or exertional cp, hempoptysis, change in activity tolerance orthopnea pnd or leg swelling  >>Doxycycline and steroid taper   10/15/2010 Post Hospital  Pt returns for follow up from hospitalization. Admitted 9/10-9/21/12 for acute on chronic hypercarbic resp failure complicated by acute on chronic renal failure,  interstitial pulmonary edema, and  stable spiculated right lung mass. The patient was started on treatment with Levaquin, steroids and nebs. Along with diuresis.   The CT scan which was noncontrasted, revealed the stable spiculated mass, consistent with scarring.  An attempt was made to acquire BiPAP at bedtime for the patient prior to discharge. However, according to the case manager, she did not meet criteria  for BiPAP at home.  She was tapered off steroids and completed a full course of abx. She did require diuresis which has to be discontinued due to worsening renal failure. Benicar was also stopped .  Started on clonidine 0.1 mg 3 times daily.    Since discharge she breathing is doing better .  No coughing or hemoptysis . No fever or discolored mucus. Currently taking spiriva and symbicort.  Recently dx with UTI currently on Cipro.     Review of Systems Constitutional:   No  weight loss, night sweats,  Fevers, chills, fatigue, or  lassitude.  HEENT:   No headaches,  Difficulty swallowing,  Tooth/dental problems, or  Sore throat,                No sneezing, itching, ear ache, nasal congestion, post nasal drip,   CV:  No chest pain,  Orthopnea, PND, swelling in lower extremities, anasarca, dizziness, palpitations, syncope.   GI  No heartburn, indigestion, abdominal pain, nausea, vomiting, diarrhea, change in bowel habits, loss of appetite, bloody stools.   Resp: No shortness of breath with exertion or at rest.  No excess mucus, no productive cough,  No non-productive cough,  No coughing up of blood.  No change in color of mucus.  No wheezing.  No chest wall deformity  Skin: no rash or lesions.  GU: no dysuria, change in color of urine, no urgency or frequency.  No flank pain, no hematuria   MS:  No joint pain or swelling.  No decreased range of motion.  No back pain.  Psych:  No change in mood or affect. No depression or anxiety.  No memory loss.         Objective:   Physical Exam GEN: A/Ox3; pleasant , NAD, well nourished   HEENT:  /AT,  EACs-clear, TMs-wnl, NOSE-clear, THROAT-clear, no lesions, no postnasal drip or exudate noted.   NECK:  Supple w/ fair ROM; no JVD; normal carotid impulses w/o bruits; no thyromegaly or nodules palpated; no lymphadenopathy.  RESP  Clear  P & A; w/o, wheezes/ rales/ or rhonchi.no accessory muscle use, no dullness to percussion  CARD:  RRR, no m/r/g  , no  peripheral edema, pulses intact, no cyanosis or clubbing.  GI:   Soft & nt; nml bowel sounds; no organomegaly or masses detected.  Musco: Warm bil, no deformities or joint swelling noted.   Neuro: alert, no focal deficits noted.    Skin: Warm, no lesions or rashes         Assessment & Plan:

## 2010-10-15 NOTE — Patient Instructions (Addendum)
For now, please continue insulin 55 units 2x a day (with 1st and last meals of the day).  If blood sugar stays over 200, start increasing by 5 units 2x a day, until it returns to the 100's.   Use the anti-itch cream on your legs as needed. Refer to a kidney specialist.  you will receive a phone call, about a day and time for an appointment. i have sent a prescription to your pharmacy, for an antibiotic. Please come back for a follow-up appointment for 1 month.  Please make an appointment.

## 2010-10-23 ENCOUNTER — Telehealth: Payer: Self-pay | Admitting: Internal Medicine

## 2010-10-23 NOTE — Telephone Encounter (Signed)
Returning call.  Tresa Endo can be reached at [phone].  Antionette Fairy

## 2010-10-23 NOTE — Telephone Encounter (Signed)
[email or web address]  

## 2010-10-23 NOTE — Telephone Encounter (Signed)
Spoke with Tresa Endo, Nurse with [facility]. She states that she saw pt this am in her home and pt was struggling to catch her breath, even at rest, also has bilateral pitting edema, and BP was 180/90. She states she advised that pt to go ED and was unsure if she ever went or not. I called and spoke with pt. She states edema no better and still having increased SOB. I advised go to ED. Pt verbalized understanding. MW aware.

## 2010-10-28 ENCOUNTER — Telehealth: Payer: Self-pay

## 2010-10-28 NOTE — Telephone Encounter (Signed)
Pt was on ABX for a UTI but will complete course in 2 days. HHRN is requesting MD advise: Verbal for additional visit and repeat UA?

## 2010-10-29 LAB — CBC
HCT: 36.1
HCT: 37.9
Hemoglobin: 12.7
MCHC: 32.7
MCHC: 32.6
MCV: 89.6
MCV: 90.5
Platelets: 229
Platelets: 252
RBC: 4.32
RBC: 3.99
RBC: 4.24
WBC: 9.4
WBC: 10

## 2010-10-29 LAB — COMPREHENSIVE METABOLIC PANEL
ALT: 44 — ABNORMAL HIGH
Alkaline Phosphatase: 59
CO2: 27
Chloride: 97
GFR calc non Af Amer: 38 — ABNORMAL LOW
Glucose, Bld: 247 — ABNORMAL HIGH
Potassium: 3.8
Sodium: 133 — ABNORMAL LOW
Total Bilirubin: 1

## 2010-10-29 LAB — URINALYSIS, ROUTINE W REFLEX MICROSCOPIC
Glucose, UA: NEGATIVE
Nitrite: POSITIVE — AB
Specific Gravity, Urine: 1.017
pH: 6

## 2010-10-29 LAB — HEPATIC FUNCTION PANEL
ALT: 64 — ABNORMAL HIGH
AST: 21
Alkaline Phosphatase: 50
Bilirubin, Direct: <0.1
Total Protein: 6.6

## 2010-10-29 LAB — BASIC METABOLIC PANEL
BUN: 22
BUN: 22
CO2: 30
Chloride: 93 — ABNORMAL LOW
Chloride: 96
Creatinine, Ser: 1.26 — ABNORMAL HIGH
GFR calc Af Amer: >60
GFR calc non Af Amer: 58 — ABNORMAL LOW
Potassium: 4
Potassium: 5
Sodium: 133 — ABNORMAL LOW

## 2010-10-29 LAB — URINE CULTURE

## 2010-10-29 LAB — URINE MICROSCOPIC-ADD ON

## 2010-10-29 LAB — CULTURE, BLOOD (ROUTINE X 2)

## 2010-10-29 LAB — DIFFERENTIAL
Basophils Relative: 0
Eosinophils Absolute: 0
Eosinophils Relative: 0
Neutrophils Relative %: 93 — ABNORMAL HIGH

## 2010-10-29 LAB — ALT: ALT: 91 — ABNORMAL HIGH

## 2010-10-29 LAB — AST: AST: 50 — ABNORMAL HIGH

## 2010-10-30 ENCOUNTER — Ambulatory Visit (INDEPENDENT_AMBULATORY_CARE_PROVIDER_SITE_OTHER): Payer: Medicare Other | Admitting: Internal Medicine

## 2010-10-30 ENCOUNTER — Encounter: Payer: Self-pay | Admitting: Internal Medicine

## 2010-10-30 VITALS — BP 140/62 | HR 114 | Temp 98.7°F | Ht 66.5 in | Wt 270.2 lb

## 2010-10-30 DIAGNOSIS — J9610 Chronic respiratory failure, unspecified whether with hypoxia or hypercapnia: Secondary | ICD-10-CM

## 2010-10-30 DIAGNOSIS — J449 Chronic obstructive pulmonary disease, unspecified: Secondary | ICD-10-CM

## 2010-10-30 DIAGNOSIS — N19 Unspecified kidney failure: Secondary | ICD-10-CM

## 2010-10-30 NOTE — Progress Notes (Signed)
Subjective:    Patient ID: [name], female    DOB: 1945-07-04, 65 y.o.   MRN: [id number]  HPI 62 yowf with morbid obesity/ aodm variably quits smoking  with known hx of COPD- O2 dependent t.   March 07, 2009-- Last seen in 2009. She presents for post hosp follow up -  Admitted 2/3-2/15 for Acute exacerbation of chronic obstructive pulmonary disease, community-acquired pneumonia, and acute respiratory failure, requiring intubation. Found unresponsive at home O2 was not working at home , O2 sat found to 60%. She was treated with IV steroids, IV antibiotics, and nebulized bronchodilators. Extubated February 18, 2009. Steroids weaned down to 10mg  daily > weaned off with no flare   October 15, 2009 Acute visit. Pt c/o increased SOB x 2 wks. She states that she is only SOB with exertion and sometimes gets out of breath just walking from room to room. She also c/o prod cough with green sputum x 2 wks. usually use proaire once a day but now needing every 4 hours. doe even on 02 @ 24 hours per day. Pt denies any significant sore throat, dysphagia, itching, sneezing, nasal congestion or excess secretions, fever, chills, sweats, unintended wt loss, pleuritic or exertional cp, hempoptysis, change in activity tolerance orthopnea pnd or leg swelling  >>Doxycycline and steroid taper > did not return for f/u  10/15/2010 [facility] NP ov/ Admitted 9/10-9/21/12 for acute on chronic hypercarbic resp failure complicated by acute on chronic renal failure,  interstitial pulmonary edema, and  stable spiculated right lung mass. The patient was started on treatment with Levaquin, steroids and nebs. Along with diuresis.   The CT scan which was noncontrasted, revealed the stable spiculated mass, consistent with scarring.  An attempt was made to acquire BiPAP at bedtime for the patient prior to discharge. However, according to the case manager, she did not meet criteria  for BiPAP at home. She was tapered off steroids  and completed a full course of abx. She did require diuresis which has to be discontinued due to worsening renal failure. Benicar was also stopped .  Started on clonidine 0.1 mg 3 times daily.    Since discharge she breathing better .  No coughing or hemoptysis . No fever or discolored mucus. Currently taking spiriva and symbicort.  Recently dx with UTI currently on Cipro. rec ontinue on Symbicort and Spiriva  follow up Dr. Sherene Sires  In 2-3 weeks.  Consider BIPAP and will discuss on return office visit.    10/30/2010 f/u ov/Gaege Sangalang off cigarettes and off predisone on 02 4lpm 24 hours cc doe x 50 ft,  No cough, not using neb but has proair twice daily with doe x 25 ft. Only new c/o is increased leg swelling and has f/u with renal scheduled w/in the next week  Sleeping ok without nocturnal  or early am exacerbation  of respiratory  C/o's or HA/ drowsiness or need for noct saba. Also denies any obvious fluctuation of symptoms with weather or environmental changes or other aggravating or alleviating factors except as outlined above   ROS  At present neg for  any significant sore throat, dysphagia, itching, sneezing,  nasal congestion or excess/ purulent secretions,  fever, chills, sweats, unintended wt loss, pleuritic or exertional cp, hempoptysis, orthopnea pnd   Also denies presyncope, palpitations, heartburn, abdominal pain, nausea, vomiting, diarrhea  or change in bowel or urinary habits, dysuria,hematuria,  rash, arthralgias, visual complaints, headache, numbness weakness or ataxia.  Objective:   Physical Exam GEN: A/Ox3; pleasant , NAD, well nourished   HEENT:  [place]/AT,  EACs-clear, TMs-wnl, NOSE-clear, THROAT-clear, no lesions, no postnasal drip or exudate noted.   NECK:  Supple w/ fair ROM; no JVD; normal carotid impulses w/o bruits; no thyromegaly or nodules palpated; no lymphadenopathy.  RESP  Clear  P & A; distant bs bilaterally w/o, wheezes/ rales/ or rhonchi.no  accessory muscle use, no dullness to percussion  CARD:  RRR, no m/r/g  , no peripheral edema, pulses intact, no cyanosis or clubbing.  GI:   Soft & nt; nml bowel sounds; no organomegaly or masses detected.  Musco: Warm bil, no deformities or joint swelling noted.   Neuro: alert, no focal deficits noted.    Skin: Warm, no lesions or rashes         Assessment & Plan:

## 2010-10-30 NOTE — Patient Instructions (Addendum)
Work on inhaler technique:  relax and gently blow all the way out then take a nice smooth deep breath back in, triggering the inhaler at same time you start breathing in.  Hold for up to 5 seconds if you can.  Rinse and gargle with water when done   If your mouth or throat starts to bother you,   I suggest you time the inhaler to your dental care and after using the inhaler(s) brush teeth and tongue with a baking soda containing toothpaste and when you rinse this out, gargle with it first to see if this helps your mouth and throat.     Double furosemide to where you take it 2 each am until swelling better, then one daily thereafter   Please schedule a follow up office visit in 4 weeks, sooner if needed pfts

## 2010-10-31 ENCOUNTER — Encounter: Payer: Self-pay | Admitting: Internal Medicine

## 2010-10-31 NOTE — Progress Notes (Signed)
  [name], [name]                 ACCOUNT NO.:  [id number]  MEDICAL RECORD NO.:  [id number]  LOCATION:  1502                         FACILITY:  [facility]  PHYSICIAN:  Hayze Gazda I Marcelles Clinard, MD      DATE OF BIRTH:  [birth year]                                PROGRESS NOTE   PRIMARY CARE PHYSICIAN: Sean A. Everardo All, MD, with [place] primary care physician.  DISCHARGE DIAGNOSES: 1. 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 2. Chronic obstructive pulmonary disease with exacerbation. 3. Hypoventilation syndrome and obstructive sleep apnea. 4. Diastolic congestive heart failure secondary to the above. 5. Chronic renal insufficiency stage 3. 6. Deconditioning. 7. Anxiety. 8. Diabetes mellitus. 9. Hypertension.  CONSULTATIONS: Pulmonary and critical care consulted.  PROCEDURES: 1. Chest x-ray, vascular congestion, mild cardiomegaly with mild     increased interstitial marking, likely reflecting very mild     interstitial edema. 2. Chest x-ray, no definite evidence of acute cardiopulmonary disease.     If clinical concern persist further evaluation. 3. Chest x-ray, no acute cardiopulmonary process. 4. A 2-D echo, limited study.  HISTORY OF PRESENT ILLNESS: This is a 65 year old female with COPD on 4 L oxygen with history of intubation in February 2011, diabetes, and morbid obesity, presented with dyspnea and coughing.  In the emergency room, she was seen to be hemodynamically stable with blood pressure 152/62, pulse rate 134, respiratory rate 18, and temperature 98.7.  She was started on oxygen and she is currently on 100% non-rebreather mask.  Accordingly, the patient placed on step-down unit for diagnosis of acute-on-chronic hypercapnic and hypoxic respiratory failure and started on broach- spectrum antibiotics, nebs treatment, Solu-Medrol, and IV Lasix. Pulmonary consulted.  The patient seen Dr. Sherene Sires in the past and currently the patient on BiPAP at bedtime.  The  patient still with significant hypoxia and shortness of breath.  Chest x-ray did not show any acute cardiopulmonary process.  Looking from previous record, the patient has CT chest which suggest spiculated mass.  I will proceed with CT chest without contrast and we will continue with the current nebs treatment and antibiotic.  The patient has Haemophilus influenzae on the respiratory in February of this year.  Accordingly, Zithromax added. Currently, the patient on Levaquin and Zithromax and still the patient was in significant respiratory distress.  Case discussed with the patient and the patient currently is DNR/DNI and we will continue to follow up the patient until improvement on her respiratory status.     Press Casale Bosie Helper, MD     HIE/MEDQ  D:  09/29/2010  T:  09/29/2010  Job:  [id number]  Electronically Signed by Ebony Cargo MD on 10/31/2010 08:13:56 PM

## 2010-10-31 NOTE — Assessment & Plan Note (Signed)
DDX of  difficult airways managment all start with A and  include Adherence, Ace Inhibitors, Acid Reflux, Active Sinus Disease, Alpha 1 Antitripsin deficiency, Anxiety masquerading as Airways dz,  ABPA,  allergy(esp in young), Aspiration (esp in elderly), Adverse effects of DPI,  Active smokers, plus two Bs  = Bronchiectasis and Beta blocker use..and one C= CHF  Adherence is always the initial "prime suspect" and is a multilayered concern that requires a "trust but verify" approach in every patient - starting with knowing how to use medications, especially inhalers, correctly, keeping up with refills and understanding the fundamental difference between maintenance and prns vs those medications only taken for a very short course and then stopped and not refilled.   The proper method of use, as well as anticipated side effects, of this metered-dose inhaler are discussed and demonstrated to the patient. Improved to 50% with coaching  ?  Active smoking. I reviewed the Flethcher curve with patient that basically indicates  if you quit smoking when your best day FEV1 is still well preserved it is highly unlikely you will progress to severe disease and informed the patient there was no medication on the market that has proven to change the curve or the likelihood of progression.  Therefore stopping smoking and maintaining abstinence is the most important aspect of care, not choice of inhalers or for that matter, doctors.  Needs f/u pft's now that she reports smoking cessation for baselin

## 2010-10-31 NOTE — Assessment & Plan Note (Signed)
Lab Results  Component Value Date   CREATININE 1.92* 10/02/2010   CREATININE 1.97* 10/01/2010   CREATININE 2.04* 09/30/2010   ok to use extra furosemide for now for excess swelling  See instructions for specific recommendations which were reviewed directly with the patient who was given a copy with highlighter outlining the key components.

## 2010-10-31 NOTE — Assessment & Plan Note (Signed)
Adequate control on present rx, reviewed  

## 2010-11-02 NOTE — Telephone Encounter (Signed)
Address by Pulmonary

## 2010-11-05 ENCOUNTER — Telehealth: Payer: Self-pay | Admitting: *Deleted

## 2010-11-05 NOTE — Telephone Encounter (Signed)
Ov next avail.  

## 2010-11-05 NOTE — Telephone Encounter (Signed)
[name] PT called regarding pt's BP. BP was elevated today, pt checked with her home monitor it was 166-98, PT checked BP was 180-98. PT states BP is always elevated when he comes to see pt. PT letting MD know to see if medications need to be adjusted, please advise.

## 2010-11-06 NOTE — Telephone Encounter (Signed)
Left message for pt to callback office and informed PT of MD's advisement for pt to make appointment.

## 2010-11-06 NOTE — Telephone Encounter (Signed)
Called pt at number listed above, no answer, unable to leave message.  

## 2010-11-11 NOTE — Telephone Encounter (Signed)
Called pt at number listed above, no answer, unable to leave message.  

## 2010-11-11 NOTE — Telephone Encounter (Signed)
Left message for pt to callback office.  

## 2010-11-12 NOTE — Telephone Encounter (Signed)
Left message for pt to callback office.  

## 2010-11-12 NOTE — Telephone Encounter (Signed)
Appointment scheduled 11/27/2010. Pt did not want an earlier time. States that BP was okay today.

## 2010-11-20 ENCOUNTER — Emergency Department (HOSPITAL_COMMUNITY): Payer: Medicare Other

## 2010-11-20 ENCOUNTER — Other Ambulatory Visit: Payer: Self-pay

## 2010-11-20 ENCOUNTER — Inpatient Hospital Stay (HOSPITAL_COMMUNITY)
Admission: EM | Admit: 2010-11-20 | Discharge: 2010-12-01 | DRG: 189 | Disposition: A | Discharge status: Skilled Nursing Facility | Payer: Medicare Other | Attending: Pulmonary Disease | Admitting: Pulmonary Disease

## 2010-11-20 ENCOUNTER — Encounter (HOSPITAL_COMMUNITY): Payer: Self-pay | Admitting: *Deleted

## 2010-11-20 ENCOUNTER — Telehealth: Payer: Self-pay | Admitting: Internal Medicine

## 2010-11-20 DIAGNOSIS — Z7982 Long term (current) use of aspirin: Secondary | ICD-10-CM

## 2010-11-20 DIAGNOSIS — J9620 Acute and chronic respiratory failure, unspecified whether with hypoxia or hypercapnia: Principal | ICD-10-CM | POA: Diagnosis present

## 2010-11-20 DIAGNOSIS — I279 Pulmonary heart disease, unspecified: Secondary | ICD-10-CM | POA: Diagnosis present

## 2010-11-20 DIAGNOSIS — J449 Chronic obstructive pulmonary disease, unspecified: Secondary | ICD-10-CM

## 2010-11-20 DIAGNOSIS — Z780 Asymptomatic menopausal state: Secondary | ICD-10-CM

## 2010-11-20 DIAGNOSIS — R079 Chest pain, unspecified: Secondary | ICD-10-CM

## 2010-11-20 DIAGNOSIS — J441 Chronic obstructive pulmonary disease with (acute) exacerbation: Secondary | ICD-10-CM | POA: Diagnosis present

## 2010-11-20 DIAGNOSIS — G2581 Restless legs syndrome: Secondary | ICD-10-CM | POA: Diagnosis present

## 2010-11-20 DIAGNOSIS — I498 Other specified cardiac arrhythmias: Secondary | ICD-10-CM | POA: Diagnosis present

## 2010-11-20 DIAGNOSIS — I509 Heart failure, unspecified: Secondary | ICD-10-CM | POA: Diagnosis present

## 2010-11-20 DIAGNOSIS — R300 Dysuria: Secondary | ICD-10-CM

## 2010-11-20 DIAGNOSIS — N2581 Secondary hyperparathyroidism of renal origin: Secondary | ICD-10-CM

## 2010-11-20 DIAGNOSIS — R930 Abnormal findings on diagnostic imaging of skull and head, not elsewhere classified: Secondary | ICD-10-CM

## 2010-11-20 DIAGNOSIS — M545 Low back pain: Secondary | ICD-10-CM

## 2010-11-20 DIAGNOSIS — E119 Type 2 diabetes mellitus without complications: Secondary | ICD-10-CM

## 2010-11-20 DIAGNOSIS — IMO0002 Reserved for concepts with insufficient information to code with codable children: Secondary | ICD-10-CM

## 2010-11-20 DIAGNOSIS — E874 Mixed disorder of acid-base balance: Secondary | ICD-10-CM | POA: Diagnosis present

## 2010-11-20 DIAGNOSIS — D509 Iron deficiency anemia, unspecified: Secondary | ICD-10-CM

## 2010-11-20 DIAGNOSIS — Z6841 Body Mass Index (BMI) 40.0 and over, adult: Secondary | ICD-10-CM

## 2010-11-20 DIAGNOSIS — R05 Cough: Secondary | ICD-10-CM

## 2010-11-20 DIAGNOSIS — J9611 Chronic respiratory failure with hypoxia: Secondary | ICD-10-CM | POA: Insufficient documentation

## 2010-11-20 DIAGNOSIS — J9600 Acute respiratory failure, unspecified whether with hypoxia or hypercapnia: Secondary | ICD-10-CM

## 2010-11-20 DIAGNOSIS — R109 Unspecified abdominal pain: Secondary | ICD-10-CM

## 2010-11-20 DIAGNOSIS — N19 Unspecified kidney failure: Secondary | ICD-10-CM

## 2010-11-20 DIAGNOSIS — G5600 Carpal tunnel syndrome, unspecified upper limb: Secondary | ICD-10-CM

## 2010-11-20 DIAGNOSIS — Z79899 Other long term (current) drug therapy: Secondary | ICD-10-CM

## 2010-11-20 DIAGNOSIS — J9690 Respiratory failure, unspecified, unspecified whether with hypoxia or hypercapnia: Secondary | ICD-10-CM

## 2010-11-20 DIAGNOSIS — R609 Edema, unspecified: Secondary | ICD-10-CM

## 2010-11-20 DIAGNOSIS — Z87891 Personal history of nicotine dependence: Secondary | ICD-10-CM

## 2010-11-20 DIAGNOSIS — Z8701 Personal history of pneumonia (recurrent): Secondary | ICD-10-CM

## 2010-11-20 DIAGNOSIS — M79609 Pain in unspecified limb: Secondary | ICD-10-CM

## 2010-11-20 DIAGNOSIS — J9610 Chronic respiratory failure, unspecified whether with hypoxia or hypercapnia: Secondary | ICD-10-CM

## 2010-11-20 DIAGNOSIS — E7800 Pure hypercholesterolemia, unspecified: Secondary | ICD-10-CM | POA: Diagnosis present

## 2010-11-20 DIAGNOSIS — I10 Essential (primary) hypertension: Secondary | ICD-10-CM

## 2010-11-20 DIAGNOSIS — F411 Generalized anxiety disorder: Secondary | ICD-10-CM | POA: Diagnosis present

## 2010-11-20 LAB — PROTIME-INR
INR: 1.05 (ref 0.00–1.49)
Prothrombin Time: 13.9 seconds (ref 11.6–15.2)

## 2010-11-20 LAB — BLOOD GAS, ARTERIAL
Bicarbonate: 36 mEq/L — ABNORMAL HIGH (ref 20.0–24.0)
FIO2: 0.5 %
Patient temperature: 98.6
TCO2: 34.1 mmol/L (ref 0–100)
pCO2 arterial: 78.3 mmHg (ref 35.0–45.0)
pH, Arterial: 7.285 — ABNORMAL LOW (ref 7.350–7.400)

## 2010-11-20 LAB — CBC
HCT: 36.5 % (ref 36.0–46.0)
Hemoglobin: 11 g/dL — ABNORMAL LOW (ref 12.0–15.0)
MCH: 27.2 pg (ref 26.0–34.0)
MCHC: 30.1 g/dL (ref 30.0–36.0)
RDW: 15 % (ref 11.5–15.5)

## 2010-11-20 LAB — DIFFERENTIAL
Basophils Absolute: 0 10*3/uL (ref 0.0–0.1)
Basophils Relative: 0 % (ref 0–1)
Eosinophils Absolute: 0 10*3/uL (ref 0.0–0.7)
Monocytes Absolute: 0.7 10*3/uL (ref 0.1–1.0)
Monocytes Relative: 6 % (ref 3–12)

## 2010-11-20 LAB — COMPREHENSIVE METABOLIC PANEL
BUN: 17 mg/dL (ref 6–23)
CO2: 37 mEq/L — ABNORMAL HIGH (ref 19–32)
Calcium: 9.2 mg/dL (ref 8.4–10.5)
Creatinine, Ser: 0.99 mg/dL (ref 0.50–1.10)
GFR calc Af Amer: 68 mL/min — ABNORMAL LOW (ref >90)
GFR calc non Af Amer: 59 mL/min — ABNORMAL LOW (ref >90)
Glucose, Bld: 177 mg/dL — ABNORMAL HIGH (ref 70–99)

## 2010-11-20 LAB — APTT: aPTT: 28 seconds (ref 24–37)

## 2010-11-20 MED ORDER — IPRATROPIUM BROMIDE 0.02 % IN SOLN
0.5000 mg | Freq: Once | RESPIRATORY_TRACT | Status: AC
  Administered 2010-11-20: 0.5 mg via RESPIRATORY_TRACT
  Filled 2010-11-20: qty 2.5

## 2010-11-20 MED ORDER — NITROGLYCERIN 2 % TD OINT
1.0000 [in_us] | TOPICAL_OINTMENT | Freq: Once | TRANSDERMAL | Status: AC
  Administered 2010-11-20: 1 [in_us] via TOPICAL
  Filled 2010-11-20: qty 30

## 2010-11-20 MED ORDER — FUROSEMIDE 10 MG/ML IJ SOLN
80.0000 mg | Freq: Once | INTRAMUSCULAR | Status: AC
  Administered 2010-11-20: 80 mg via INTRAVENOUS
  Filled 2010-11-20: qty 8

## 2010-11-20 MED ORDER — ALBUTEROL (5 MG/ML) CONTINUOUS INHALATION SOLN
15.0000 mg | INHALATION_SOLUTION | RESPIRATORY_TRACT | Status: AC
  Filled 2010-11-20: qty 20

## 2010-11-20 NOTE — ED Provider Notes (Cosign Needed)
History     CSN: [id number] Arrival date & time: 11/20/2010  9:26 PM   First MD Initiated Contact with Patient 11/20/10 2126      Chief Complaint  Patient presents with  . Respiratory Distress   Level V caveat patient in severe respiratory distress on BiPAP  (Consider location/radiation/quality/duration/timing/severity/associated sxs/prior treatment) HPI Patient Indicates through BiPAP but she's had some shortness of breath for the last couple days she's had some cough with brown sputum production but cough has not been a big component. EMS reports they were called tonight for shortness of breath. They state her initial pulse ox was 90% on room air and respiratory rate was 20. They had to have her walk about 10 feet because they couldn't get a stretcher through her house at which point she became extremely labored breathing with respiratory rate jumping up to 30 and patient getting pale. EMS relates she had very diminished breath sounds he did not hear wheezing or rhonchi. Patient indicates she has oxygen at home. She also indicates she is having some chest pain and sore point since there were chest but she's unable to clarify further than that. He is placed patient on a BiPAP. They state her pulse ox improved to 100% and her end-tidal CO2 was 57.  Primary care doctor Dr. Everardo All   pulmonologist Dr. Sherene Sires   Past Medical History  Diagnosis Date  . RENAL INSUFFICIENCY 10/15/2008  . Edema 06/18/2008  . PNEUMONIA 12/01/2007  . CARPAL TUNNEL SYNDROME, BILATERAL 10/30/2007  . HAND PAIN, LEFT 09/04/2007  . Morbid obesity   . ANXIETY 11/08/2006  . HYPERTENSION 11/08/2006  . DIABETES MELLITUS, TYPE II 11/08/2006  . HYPERCHOLESTEROLEMIA 07/24/2009  . ANEMIA 07/24/2009  . COPD 11/08/2006    -HFA 50% p coaching 10/15/2009  . RESPIRATORY FAILURE, CHRONIC 10/15/2009  . BACK PAIN, LUMBAR 03/07/2009  . CHEST PAIN 07/24/2009  . Restless leg syndrome     History reviewed. No pertinent past surgical  history.  Family History  Problem Relation Age of Onset  . Heart disease Mother   . Pancreatic cancer Mother   . Stroke Mother   . Asthma Paternal Grandfather   . Liver cancer Father   . Diabetes Father     History  Substance Use Topics  . Smoking status: Former Smoker -- 2.0 packs/day for 40 years    Quit date: 01/12/2008  . Smokeless tobacco: Not on file  . Alcohol Use: No   lives with daughter   oxygen at home  OB History    Grav Para Term Preterm Abortions TAB SAB Ect Mult Living                  Review of Systems  All other systems reviewed and are negative.    Allergies  Pioglitazone and Rosiglitazone maleate  Home Medications   Current Outpatient Rx  Name Route Sig Dispense Refill  . ACETAMINOPHEN 500 MG PO TABS  As directed     . ALBUTEROL SULFATE (2.5 MG/3ML) 0.083% IN NEBU Nebulization Take 2.5 mg by nebulization every 6 (six) hours as needed.      . ALPRAZOLAM 0.25 MG PO TABS  TAKE ONE TABLET BY MOUTH TWICE DAILY AS NEEDED FOR ANXIETY. NOT  TO  EXCEED  2  PER  DAY 60 tablet 2  . ASPIRIN 81 MG PO TABS Oral Take 81 mg by mouth daily.      Marland Kitchen CALCITRIOL 0.25 MCG PO CAPS Oral Take 1 capsule (0.25 mcg total)  by mouth daily. 30 capsule 11  . CIPRO PO Oral Take 1 tablet by mouth 2 (two) times daily.      Marland Kitchen CITALOPRAM HYDROBROMIDE 40 MG PO TABS  TAKE ONE TABLET BY MOUTH EVERY DAY 30 tablet 3  . CLONIDINE HCL 0.1 MG PO TABS Oral Take 0.1 mg by mouth 3 (three) times daily.      Marland Kitchen DEXTROMETHORPHAN-GUAIFENESIN 30-600 MG PO TB12  1-2 tablets by mouth every 12 hours as needed     . FAMOTIDINE 20 MG PO TABS Oral Take 20 mg by mouth daily as needed.     . FUROSEMIDE 40 MG PO TABS  TAKE ONE TABLET BY MOUTH EVERY DAY 30 tablet 3  . INSULIN ISOPHANE & REGULAR (70-30) 100 UNIT/ML [place] SUSP Subcutaneous Inject 55 Units into the skin 2 (two) times daily with a meal.      . INSULIN SYRINGE-NEEDLE U-100 31G X 5/16" 1 ML MISC  As directed     . ONE-DAILY MULTI VITAMINS PO TABS Oral  Take 1 tablet by mouth daily.      . NON FORMULARY  Oxygen 4LPM  24/7     . PROAIR HFA 108 (90 BASE) MCG/ACT IN AERS  INHALE TWO PUFFS BY MOUTH EVERY 4 TO 6 HOURS AS NEEDED 9 g 6  . SPIRIVA HANDIHALER 18 MCG IN CAPS  INHALE ONE DOSE BY MOUTH EVERY DAY 30 each 3  . SYMBICORT 160-4.5 MCG/ACT IN AERO  INHALE TWO PUFFS BY MOUTH TWICE DAILY 11 g 3  . TRIAMCINOLONE ACETONIDE 0.025 % EX CREA Topical Apply topically 3 (three) times daily. As needed for itching 30 g 0    BP 143/81  Pulse 120  Temp(Src) 98.3 F (36.8 C) (Oral)  Resp 26  SpO2 100%  Vital signs normal except for tachycardia  Physical Exam  Vitals reviewed. Constitutional: She appears well-developed and well-nourished.       Shedd has BiPAP in place. She is alert awake and is able to nod her head yes or no.  HENT:  Head: Normocephalic and atraumatic.       Unable to assess oropharynx because of BiPAP machine  Eyes: Conjunctivae and EOM are normal. Pupils are equal, round, and reactive to light.  Cardiovascular: Regular rhythm and normal heart sounds.  Tachycardia present.   Pulmonary/Chest: Accessory muscle usage present. Tachypnea noted. She is in respiratory distress.       Patient has very diminished breath sounds diffusely she has some late and expiratory wheezing scattered that are faint and in the bases  Abdominal: There is no tenderness.       Obese and soft  Musculoskeletal:       Patient started to have mild pitting edema of her feet and lower leg.  Neurological: She is alert.  Skin: Skin is warm, dry and intact. Rash noted. No abrasion noted. No cyanosis.  Psychiatric: She has a normal mood and affect.    ED Course  Procedures (including critical care time)  Results for orders placed during the hospital encounter of 11/20/10  CBC      Component Value Range   WBC 10.2  4.0 - 10.5 (K/uL)   RBC 4.05  3.87 - 5.11 (MIL/uL)   Hemoglobin 11.0 (*) 12.0 - 15.0 (g/dL)   HCT 16.1  09.6 - 04.5 (%)   MCV 90.1  78.0 -  100.0 (fL)   MCH 27.2  26.0 - 34.0 (pg)   MCHC 30.1  30.0 - 36.0 (g/dL)  RDW 15.0  11.5 - 15.5 (%)   Platelets 297  150 - 400 (K/uL)  DIFFERENTIAL      Component Value Range   Neutrophils Relative 81 (*) 43 - 77 (%)   Neutro Abs 8.2 (*) 1.7 - 7.7 (K/uL)   Lymphocytes Relative 13  12 - 46 (%)   Lymphs Abs 1.3  0.7 - 4.0 (K/uL)   Monocytes Relative 6  3 - 12 (%)   Monocytes Absolute 0.7  0.1 - 1.0 (K/uL)   Eosinophils Relative 0  0 - 5 (%)   Eosinophils Absolute 0.0  0.0 - 0.7 (K/uL)   Basophils Relative 0  0 - 1 (%)   Basophils Absolute 0.0  0.0 - 0.1 (K/uL)  TROPONIN I      Component Value Range   Troponin I <0.30  <0.30 (ng/mL)  PRO B NATRIURETIC PEPTIDE      Component Value Range   BNP, POC 14935.0 (*) 0 - 125 (pg/mL)  COMPREHENSIVE METABOLIC PANEL      Component Value Range   Sodium 140  135 - 145 (mEq/L)   Potassium 3.8  3.5 - 5.1 (mEq/L)   Chloride 97  96 - 112 (mEq/L)   CO2 37 (*) 19 - 32 (mEq/L)   Glucose, Bld 177 (*) 70 - 99 (mg/dL)   BUN 17  6 - 23 (mg/dL)   Creatinine, Ser 1.61  0.50 - 1.10 (mg/dL)   Calcium 9.2  8.4 - 09.6 (mg/dL)   Total Protein 7.5  6.0 - 8.3 (g/dL)   Albumin 3.4 (*) 3.5 - 5.2 (g/dL)   AST 29  0 - 37 (U/L)   ALT 25  0 - 35 (U/L)   Alkaline Phosphatase 69  39 - 117 (U/L)   Total Bilirubin 0.4  0.3 - 1.2 (mg/dL)   GFR calc non Af Amer 59 (*) >90 (mL/min)   GFR calc Af Amer 68 (*) >90 (mL/min)  APTT      Component Value Range   aPTT 28  24 - 37 (seconds)  PROTIME-INR      Component Value Range   Prothrombin Time 13.9  11.6 - 15.2 (seconds)   INR 1.05  0.00 - 1.49   GLUCOSE, CAPILLARY      Component Value Range   Glucose-Capillary 169 (*) 70 - 99 (mg/dL)   Comment 1 Documented in Chart     Comment 2 Notify RN    BLOOD GAS, ARTERIAL      Component Value Range   FIO2 .50     Delivery systems BILEVEL POSITIVE AIRWAY PRESSURE     Inspiratory PAP 14.0     Expiratory PAP 6.0     pH, Arterial 7.285 (*) 7.350 - 7.400    pCO2 arterial 78.3  (*) 35.0 - 45.0 (mmHg)   pO2, Arterial 104.0 (*) 80.0 - 100.0 (mmHg)   Bicarbonate 36.0 (*) 20.0 - 24.0 (mEq/L)   TCO2 34.1  0 - 100 (mmol/L)   Acid-Base Excess 7.7 (*) 0.0 - 2.0 (mmol/L)   O2 Saturation 97.6     Patient temperature 98.6     Collection site RIGHT RADIAL     Drawn by [id number]     Sample type ARTERIAL DRAW     Allens test (pass/fail) PASS  PASS     Laboratory interpretation mild anemia very elevated BNP blood gas shows respiratory acidosis and metabolic alkalosis  Dg Chest Portable 1 View  11/20/2010  *RADIOLOGY REPORT*  Clinical Data: Shortness of breath.  PORTABLE CHEST - 1 VIEW  Comparison: Chest x-ray 09/29/2010.  Findings: The heart is mildly enlarged.  There is vascular congestion and interstitial pulmonary edema along with a small right pleural effusion consistent with CHF.  IMPRESSION: CHF.  Original Report Authenticated By: P. Loralie Champagne, M.D.     Date: 11/20/2010  Rate: 122  Rhythm: sinus tachycardia  QRS Axis: right  Intervals: normal  ST/T Wave abnormalities: normal  Conduction Disutrbances:none  Narrative Interpretation:   Old EKG Reviewed: unchanged from [place], 2012   Course patient remains on BiPAP. She appears to be breathing a little easier. She was given a continuous nebulizer of albuterol 50 mg and Atrovent 0.5 mg she was given Lasix IV and nitro paste for her congestive heart failure..  00:53 Dr Darrick Penna will have fellow admit  Diagnoses that have been ruled out:  Diagnoses that are still under consideration:  Final diagnoses:  Respiratory failure  Congestive heart failure    Plan admit  CRITICAL CARE Performed by: Romanda Turrubiates L   Total critical care time:32 minutes Critical care time was exclusive of separately billable procedures and treating other patients.  Critical care was necessary to treat or prevent imminent or life-threatening deterioration.  Critical care was time spent personally by me on the following  activities: development of treatment plan with patient and/or surrogate as well as nursing, discussions with consultants, evaluation of patient's response to treatment, examination of patient, obtaining history from patient or surrogate, ordering and performing treatments and interventions, ordering and review of laboratory studies, ordering and review of radiographic studies, pulse oximetry and re-evaluation of patient's condition.  Devoria Albe, MD, FACEP  MDM          Ward Givens, MD 11/21/10 [id number]  Ward Givens, MD 11/21/10 1191

## 2010-11-20 NOTE — ED Notes (Signed)
Per EMS:  EMS st's pt was in tripod position upon their arrival, tachepnic at 22 breaths per minute, pt was pale, using accessory muscles; pt unable to speak in complete sentences.  EMS initiated CPAP on scene, pt's 02 went from 90% to 100%

## 2010-11-20 NOTE — ED Notes (Signed)
ZOX:[id number]<AV> Expected date:11/20/10<BR> Expected time: 9:00 PM<BR> Means of arrival:Ambulance<BR> Comments:<BR> EMS 262 GC, 61 yom seizure w hx.

## 2010-11-20 NOTE — Telephone Encounter (Signed)
Called and spoke with pt's nurse with [facility]. She states that she saw the pt earlier this afternoon and pt's sats were 82%4lpm, pt struggling to breath, increased o2 to 5lpm and sats improved to 89%. She states that she heard some wheezing and gave her a neb tx and this only helped a little. She states that she pt reported feeling like she was "sufficating". She tried to call EMS for pt and pt refused this, stating to call MW first. I advised MW out of the office, and no appts left since so late in the day and that the pt should be taken to ER asap for eval, should not wait any longer for this. Melodie verbalized understanding and states that she has already let pt's daughter know what is going on and will call and advise pt needs to go to ER.

## 2010-11-20 NOTE — ED Notes (Signed)
TEMP FOLEY INSERTED

## 2010-11-21 ENCOUNTER — Encounter (HOSPITAL_COMMUNITY): Payer: Self-pay | Admitting: *Deleted

## 2010-11-21 DIAGNOSIS — J810 Acute pulmonary edema: Secondary | ICD-10-CM

## 2010-11-21 DIAGNOSIS — I509 Heart failure, unspecified: Secondary | ICD-10-CM

## 2010-11-21 DIAGNOSIS — E662 Morbid (severe) obesity with alveolar hypoventilation: Secondary | ICD-10-CM

## 2010-11-21 DIAGNOSIS — J9620 Acute and chronic respiratory failure, unspecified whether with hypoxia or hypercapnia: Secondary | ICD-10-CM

## 2010-11-21 DIAGNOSIS — J449 Chronic obstructive pulmonary disease, unspecified: Secondary | ICD-10-CM

## 2010-11-21 LAB — GLUCOSE, CAPILLARY
Glucose-Capillary: 136 mg/dL — ABNORMAL HIGH (ref 70–99)
Glucose-Capillary: 140 mg/dL — ABNORMAL HIGH (ref 70–99)
Glucose-Capillary: 119 mg/dL — ABNORMAL HIGH (ref 70–99)
Glucose-Capillary: 154 mg/dL — ABNORMAL HIGH (ref 70–99)

## 2010-11-21 LAB — URINALYSIS, ROUTINE W REFLEX MICROSCOPIC
Protein, ur: >300 mg/dL — AB
Urobilinogen, UA: 1 mg/dL (ref 0.0–1.0)

## 2010-11-21 LAB — MRSA PCR SCREENING: MRSA by PCR: NEGATIVE

## 2010-11-21 LAB — BLOOD GAS, ARTERIAL
Acid-Base Excess: 7.8 mmol/L — ABNORMAL HIGH (ref 0.0–2.0)
Bicarbonate: 35 mEq/L — ABNORMAL HIGH (ref 20.0–24.0)
FIO2: 0.3 %
O2 Saturation: 88.7 %
TCO2: 33 mmol/L (ref 0–100)
pCO2 arterial: 68.5 mmHg (ref 35.0–45.0)
pO2, Arterial: 58.4 mmHg — ABNORMAL LOW (ref 80.0–100.0)

## 2010-11-21 LAB — URINE MICROSCOPIC-ADD ON

## 2010-11-21 MED ORDER — INSULIN ASPART 100 UNIT/ML ~~LOC~~ SOLN
4.0000 [IU] | Freq: Three times a day (TID) | SUBCUTANEOUS | Status: DC
  Administered 2010-11-21 – 2010-11-24 (×7): 4 [IU] via SUBCUTANEOUS

## 2010-11-21 MED ORDER — LEVOFLOXACIN IN D5W 750 MG/150ML IV SOLN
750.0000 mg | INTRAVENOUS | Status: DC
  Administered 2010-11-21: 750 mg via INTRAVENOUS

## 2010-11-21 MED ORDER — THERA M PLUS PO TABS
1.0000 | ORAL_TABLET | Freq: Every day | ORAL | Status: DC
  Administered 2010-11-21 – 2010-11-25 (×5): 1 via ORAL
  Administered 2010-11-26: 10:00:00 via ORAL
  Administered 2010-11-27 – 2010-12-01 (×5): 1 via ORAL
  Filled 2010-11-21 (×12): qty 1

## 2010-11-21 MED ORDER — INSULIN ASPART 100 UNIT/ML ~~LOC~~ SOLN
0.0000 [IU] | Freq: Every day | SUBCUTANEOUS | Status: DC
  Administered 2010-11-21: 3 [IU] via SUBCUTANEOUS
  Administered 2010-11-24: 2 [IU] via SUBCUTANEOUS
  Filled 2010-11-21: qty 3

## 2010-11-21 MED ORDER — HEPARIN SODIUM (PORCINE) 5000 UNIT/ML IJ SOLN
5000.0000 [IU] | Freq: Three times a day (TID) | INTRAMUSCULAR | Status: DC
  Administered 2010-11-21 – 2010-12-01 (×28): 5000 [IU] via SUBCUTANEOUS
  Filled 2010-11-21 (×37): qty 1

## 2010-11-21 MED ORDER — BUDESONIDE-FORMOTEROL FUMARATE 160-4.5 MCG/ACT IN AERO
2.0000 | INHALATION_SPRAY | Freq: Two times a day (BID) | RESPIRATORY_TRACT | Status: DC
  Administered 2010-11-21 – 2010-12-01 (×18): 2 via RESPIRATORY_TRACT
  Filled 2010-11-21: qty 6

## 2010-11-21 MED ORDER — FUROSEMIDE 10 MG/ML IJ SOLN
40.0000 mg | Freq: Two times a day (BID) | INTRAMUSCULAR | Status: DC
  Administered 2010-11-21 – 2010-11-28 (×16): 40 mg via INTRAVENOUS
  Filled 2010-11-21 (×17): qty 4

## 2010-11-21 MED ORDER — FUROSEMIDE 10 MG/ML IJ SOLN
INTRAMUSCULAR | Status: AC
  Filled 2010-11-21: qty 4

## 2010-11-21 MED ORDER — CITALOPRAM HYDROBROMIDE 40 MG PO TABS
40.0000 mg | ORAL_TABLET | Freq: Every day | ORAL | Status: DC
  Administered 2010-11-21 – 2010-12-01 (×11): 40 mg via ORAL
  Filled 2010-11-21 (×12): qty 1

## 2010-11-21 MED ORDER — CALCITRIOL 0.25 MCG PO CAPS
0.2500 ug | ORAL_CAPSULE | Freq: Every day | ORAL | Status: DC
  Administered 2010-11-21 – 2010-12-01 (×11): 0.25 ug via ORAL
  Filled 2010-11-21 (×12): qty 1

## 2010-11-21 MED ORDER — SODIUM CHLORIDE 0.9 % IJ SOLN
3.0000 mL | Freq: Two times a day (BID) | INTRAMUSCULAR | Status: DC
  Administered 2010-11-21: 11:00:00 via INTRAVENOUS
  Administered 2010-11-21: 3 mL via INTRAVENOUS

## 2010-11-21 MED ORDER — ALPRAZOLAM 0.25 MG PO TABS
0.2500 mg | ORAL_TABLET | Freq: Three times a day (TID) | ORAL | Status: DC | PRN
  Administered 2010-11-21 – 2010-11-22 (×5): 0.25 mg via ORAL
  Filled 2010-11-21 (×5): qty 1

## 2010-11-21 MED ORDER — LEVOFLOXACIN IN D5W 750 MG/150ML IV SOLN
750.0000 mg | INTRAVENOUS | Status: DC
  Administered 2010-11-22: 750 mg via INTRAVENOUS
  Filled 2010-11-21: qty 150

## 2010-11-21 MED ORDER — LEVOFLOXACIN IN D5W 750 MG/150ML IV SOLN
INTRAVENOUS | Status: AC
  Filled 2010-11-21: qty 150

## 2010-11-21 MED ORDER — POTASSIUM CHLORIDE CRYS ER 20 MEQ PO TBCR
40.0000 meq | EXTENDED_RELEASE_TABLET | Freq: Every day | ORAL | Status: DC
  Administered 2010-11-21 – 2010-11-24 (×4): 40 meq via ORAL
  Filled 2010-11-21 (×5): qty 2

## 2010-11-21 MED ORDER — FAMOTIDINE 20 MG PO TABS
20.0000 mg | ORAL_TABLET | Freq: Two times a day (BID) | ORAL | Status: DC
  Administered 2010-11-21 – 2010-12-01 (×19): 20 mg via ORAL
  Filled 2010-11-21 (×24): qty 1

## 2010-11-21 MED ORDER — ASPIRIN 81 MG PO CHEW
324.0000 mg | CHEWABLE_TABLET | ORAL | Status: AC
  Filled 2010-11-21: qty 4

## 2010-11-21 MED ORDER — INSULIN ASPART PROT & ASPART (70-30 MIX) 100 UNIT/ML ~~LOC~~ SUSP
40.0000 [IU] | Freq: Two times a day (BID) | SUBCUTANEOUS | Status: DC
  Administered 2010-11-21 – 2010-11-23 (×5): 40 [IU] via SUBCUTANEOUS
  Filled 2010-11-21: qty 3

## 2010-11-21 MED ORDER — ALBUTEROL SULFATE HFA 108 (90 BASE) MCG/ACT IN AERS
4.0000 | INHALATION_SPRAY | RESPIRATORY_TRACT | Status: DC
Start: 2010-11-21 — End: 2010-11-21
  Administered 2010-11-21: 4 via RESPIRATORY_TRACT
  Filled 2010-11-21: qty 6.7

## 2010-11-21 MED ORDER — ALPRAZOLAM ER 0.5 MG PO TB24: 0.2500 mg | ORAL_TABLET | Freq: Three times a day (TID) | ORAL | Status: DC | PRN

## 2010-11-21 MED ORDER — IPRATROPIUM BROMIDE HFA 17 MCG/ACT IN AERS
4.0000 | INHALATION_SPRAY | RESPIRATORY_TRACT | Status: DC
  Administered 2010-11-21: 4 via RESPIRATORY_TRACT
  Filled 2010-11-21: qty 12.9

## 2010-11-21 MED ORDER — FAMOTIDINE IN NACL 20-0.9 MG/50ML-% IV SOLN
20.0000 mg | Freq: Two times a day (BID) | INTRAVENOUS | Status: DC
  Administered 2010-11-21: 20 mg via INTRAVENOUS
  Filled 2010-11-21: qty 50

## 2010-11-21 MED ORDER — CLONIDINE HCL 0.1 MG PO TABS
0.1000 mg | ORAL_TABLET | Freq: Three times a day (TID) | ORAL | Status: DC
  Administered 2010-11-21 – 2010-12-01 (×29): 0.1 mg via ORAL
  Filled 2010-11-21 (×35): qty 1

## 2010-11-21 MED ORDER — SODIUM CHLORIDE 0.9 % IV SOLN: 250.0000 mL | INTRAVENOUS | Status: DC

## 2010-11-21 MED ORDER — ASPIRIN 300 MG RE SUPP
300.0000 mg | RECTAL | Status: AC
  Filled 2010-11-21: qty 1

## 2010-11-21 MED ORDER — IPRATROPIUM BROMIDE 0.02 % IN SOLN
0.5000 mg | Freq: Four times a day (QID) | RESPIRATORY_TRACT | Status: DC
  Administered 2010-11-21 – 2010-12-01 (×42): 0.5 mg via RESPIRATORY_TRACT
  Filled 2010-11-21 (×43): qty 2.5

## 2010-11-21 MED ORDER — SODIUM CHLORIDE 0.9 % IJ SOLN: 3.0000 mL | INTRAMUSCULAR | Status: DC | PRN

## 2010-11-21 MED ORDER — GNP PEPPERMINT SPIRIT SPRT
Status: DC | PRN
  Filled 2010-11-21: qty 30

## 2010-11-21 MED ORDER — SODIUM CHLORIDE 0.9 % IV SOLN
250.0000 mL | INTRAVENOUS | Status: DC | PRN
  Administered 2010-11-24: 500 mL via INTRAVENOUS

## 2010-11-21 MED ORDER — ALBUTEROL SULFATE (5 MG/ML) 0.5% IN NEBU
2.5000 mg | INHALATION_SOLUTION | Freq: Four times a day (QID) | RESPIRATORY_TRACT | Status: DC
  Administered 2010-11-21 – 2010-12-01 (×41): 2.5 mg via RESPIRATORY_TRACT
  Filled 2010-11-21 (×44): qty 0.5

## 2010-11-21 MED ORDER — ALBUTEROL SULFATE (5 MG/ML) 0.5% IN NEBU
2.5000 mg | INHALATION_SOLUTION | RESPIRATORY_TRACT | Status: DC | PRN
  Administered 2010-11-21: 2.5 mg via RESPIRATORY_TRACT

## 2010-11-21 MED ORDER — INSULIN ASPART 100 UNIT/ML ~~LOC~~ SOLN
0.0000 [IU] | Freq: Three times a day (TID) | SUBCUTANEOUS | Status: DC
  Administered 2010-11-22: 3 [IU] via SUBCUTANEOUS
  Administered 2010-11-23 – 2010-11-27 (×4): 2 [IU] via SUBCUTANEOUS
  Administered 2010-11-27: 3 [IU] via SUBCUTANEOUS
  Administered 2010-11-28 – 2010-11-29 (×3): 2 [IU] via SUBCUTANEOUS
  Administered 2010-11-30: 3 [IU] via SUBCUTANEOUS
  Administered 2010-12-01: 2 [IU] via SUBCUTANEOUS
  Administered 2010-12-01: 3 [IU] via SUBCUTANEOUS
  Filled 2010-11-21: qty 3

## 2010-11-21 NOTE — H&P (Signed)
Chief complaints: Shortness of breath.  HPI: Patient is currently on BIPAP and answers the questions by nodding her head in yes or no. Most of the history is taken from the ED documents. Patient was apparently well until this morning when she noticed some shortness of breath which was progressive. She also had some cough which was brownish in color. No fever, chills or rigors. EMS was called who put her in CPAP. During the transfer she happen to desaturate. She was treated with albuterol nebs and lasix in the ED for presumed COPD exacerbation vs CHF.  Past Medical History  Diagnosis Date  . RENAL INSUFFICIENCY 10/15/2008  . Edema 06/18/2008  . PNEUMONIA 12/01/2007  . CARPAL TUNNEL SYNDROME, BILATERAL 10/30/2007  . HAND PAIN, LEFT 09/04/2007  . Morbid obesity   . ANXIETY 11/08/2006  . HYPERTENSION 11/08/2006  . DIABETES MELLITUS, TYPE II 11/08/2006  . HYPERCHOLESTEROLEMIA 07/24/2009  . ANEMIA 07/24/2009  . COPD 11/08/2006    -HFA 50% p coaching 10/15/2009  . RESPIRATORY FAILURE, CHRONIC 10/15/2009  . BACK PAIN, LUMBAR 03/07/2009  . CHEST PAIN 07/24/2009  . Restless leg syndrome    History   Social History  . Marital Status: Widowed    Spouse Name: N/A    Number of Children: 2  . Years of Education: N/A   Occupational History  . 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 Smoking status: Former Smoker -- 2.0 packs/day for 40 years    Quit date: 01/12/2008  . Smokeless tobacco: Not on file  . Alcohol Use: No  . Drug Use: No  . Sexually Active: Not on file   Other Topics Concern  . Not on file   Social History Narrative   Divorced. Lives with dtr.   Family History  Problem Relation Age of Onset  . Heart disease Mother   . Pancreatic cancer Mother   . Stroke Mother   . Asthma Paternal Grandfather   . Liver cancer Father   . Diabetes Father    Physical Exam  Constitutional: She is oriented to person, place, and time. She appears well-developed.    Eyes: Pupils are equal, round, and reactive to light.  Neck: Normal range of motion.  Cardiovascular: Normal rate and regular rhythm.  Exam reveals no gallop.   Pulmonary/Chest: Effort normal and breath sounds normal.  Abdominal: Soft. Bowel sounds are normal.  Musculoskeletal: Normal range of motion.  Neurological: She is alert and oriented to person, place, and time. She has normal reflexes.  Skin: Skin is warm and dry.  Extremity: bilateral pedal pitting edema.  Labs Reviewed  CBC - Abnormal; Notable for the following:    Hemoglobin 11.0 (*)    All other components within normal limits  DIFFERENTIAL - Abnormal; Notable for the following:    Neutrophils Relative 81 (*)    Neutro Abs 8.2 (*)    All other components within normal limits  PRO B NATRIURETIC PEPTIDE - Abnormal; Notable for the following:    BNP, POC 14935.0 (*)    All other components within normal limits  COMPREHENSIVE METABOLIC PANEL - Abnormal; Notable for the following:    CO2 37 (*)    Glucose, Bld 177 (*)    Albumin 3.4 (*)    GFR calc non Af Amer 59 (*)    GFR calc Af Amer 68 (*)    All other components within normal limits  GLUCOSE, CAPILLARY - Abnormal; Notable for the following:  Glucose-Capillary 169 (*)    All other components within normal limits  URINALYSIS, ROUTINE W REFLEX MICROSCOPIC - Abnormal; Notable for the following:    Color, Urine AMBER (*) BIOCHEMICALS MAY BE AFFECTED BY COLOR   Appearance TURBID (*)    Glucose, UA 100 (*)    Hgb urine dipstick LARGE (*)    Bilirubin Urine SMALL (*)    Protein, ur >300 (*)    All other components within normal limits  BLOOD GAS, ARTERIAL - Abnormal; Notable for the following:    pH, Arterial 7.285 (*)    pCO2 arterial 78.3 (*)    pO2, Arterial 104.0 (*)    Bicarbonate 36.0 (*)    Acid-Base Excess 7.7 (*)    All other components within normal limits  URINE MICROSCOPIC-ADD ON - Abnormal; Notable for the following:    Bacteria, UA MANY (*)    All  other components within normal limits  TROPONIN I  APTT  PROTIME-INR  URINE CULTURE  BLOOD GAS, ARTERIAL   Assessment and plan: Problem list: 1.COPD 2. CHF 3. Hypercapnic respiratory failure 4. Pneumonia  Plan: 1. COPD: Pt has history of COPD, currently she is not wheezing, does not seem to be in COPD exacerbation. Would continue albuterol prn and other bronchodilators. Will cont. BIPAP for now.  2. CHF: Patient had sob, with bilateral lower extremity edema (pitting), chest x-ray suggestive of congestion and very high Pro-BNP level. Would cont. Treatment with lasix, daily weight, I/O and echocardiogram in am. EKG does not show any ST-T wave changes, troponin wnl. Cont. BIPAP.  3. Type II respiratory failure: Possibly secondary to underlying COPD, cont BIPAP, would repeat ABG.  4. Pneumonia: Less likely, as patient does not have fever, chills or rigors. But on chest x-ray she has some peripheral opacity and she had complained of cough which was productive and brownish in color. Will empirically treat with Levaquin. Will send blood culture and sputum culture.

## 2010-11-21 NOTE — Progress Notes (Signed)
Name: [name] MRN: [id number] DOB: 11/20/1945  ELECTRONIC ICU PHYSICIAN NOTE  Problem:  Contacted by RN, patient requesting home Rx  Intervention:  Home Rx reconciled  Magin Balbi 11/21/2010, 5:35 PM

## 2010-11-21 NOTE — Progress Notes (Signed)
ANTIBIOTIC CONSULT NOTE - INITIAL  Pharmacy Consult for Antibiotic dosing adjustment for renal function Indication: R/O pneumonia (on Levaquin)  Allergies  Allergen Reactions  . Pioglitazone     REACTION: edema  . Rosiglitazone Maleate     Patient Measurements: Height: 5' 6.5" (168.9 cm) Weight: 263 lb 7.2 oz (119.5 kg) IBW/kg (Calculated) : 60.45     Vital Signs: Temp: 98.4 F (36.9 C) (11/10 0634) Temp src: Oral (11/10 0634) BP: 129/81 mmHg (11/10 0634) Pulse Rate: 116  (11/10 0634) Intake/Output from previous day:   Intake/Output from this shift:    Labs:  [facility] 11/20/10 2135  WBC 10.2  HGB 11.0*  PLT 297  LABCREA --  CREATININE 0.99   Estimated Creatinine Clearance: 75.2 ml/min (by C-G formula based on Cr of 0.99). No results found for this basename: VANCOTROUGH:2,VANCOPEAK:2,VANCORANDOM:2,GENTTROUGH:2,GENTPEAK:2,GENTRANDOM:2,TOBRATROUGH:2,TOBRAPEAK:2,TOBRARND:2,AMIKACINPEAK:2,AMIKACINTROU:2,AMIKACIN:2, in the last 72 hours   Microbiology: No results found for this or any previous visit (from the past 720 hour(s)).  Medical History: Past Medical History  Diagnosis Date  . RENAL INSUFFICIENCY 10/15/2008  . Edema 06/18/2008  . PNEUMONIA 12/01/2007  . CARPAL TUNNEL SYNDROME, BILATERAL 10/30/2007  . HAND PAIN, LEFT 09/04/2007  . Morbid obesity   . ANXIETY 11/08/2006  . HYPERTENSION 11/08/2006  . DIABETES MELLITUS, TYPE II 11/08/2006  . HYPERCHOLESTEROLEMIA 07/24/2009  . ANEMIA 07/24/2009  . COPD 11/08/2006    -HFA 50% p coaching 10/15/2009  . RESPIRATORY FAILURE, CHRONIC 10/15/2009  . BACK PAIN, LUMBAR 03/07/2009  . CHEST PAIN 07/24/2009  . Restless leg syndrome     Medications:  Scheduled:    . albuterol  4 puff Inhalation Q4H  . aspirin  324 mg Oral NOW   Or  . aspirin  300 mg Rectal NOW  . famotidine (PEPCID) IV  20 mg Intravenous Q12H  . furosemide  40 mg Intravenous BID  . furosemide  80 mg Intravenous Once  . heparin  5,000 Units  Subcutaneous Q8H  . ipratropium  4 puff Inhalation Q4H  . ipratropium  0.5 mg Nebulization Once  . Levofloxacin      . levofloxacin (LEVAQUIN) IV  750 mg Intravenous Q24H  . nitroGLYCERIN  1 inch Topical Once  . sodium chloride  3 mL Intravenous Q12H  . DISCONTD: levofloxacin (LEVAQUIN) IV  750 mg Intravenous Q24H   Infusions:    . sodium chloride    . albuterol Stopped (11/20/10 2250)   Assessment: 65 yr old female who was started on Levaquin 750mg  IV daily for r/o PNA  Goal of Therapy:    Plan:  Continue Levaquin 750mg  IV daily.  Follow renal function and adjust dose if necessary.  Gracelin Weisberg Trefz 11/21/2010,6:49 AM

## 2010-11-21 NOTE — Progress Notes (Signed)
Follow up - Critical Care Medicine Note  Patient Details:    [name] is an 65 y.o. female.  Lines, Airways, Drains: Urethral Catheter Temperature probe 14 Fr. (Active)  Site Assessment Clean;Intact 11/21/2010  6:51 AM  Collection Container Standard drainage bag 11/21/2010  6:51 AM  Securement Method Securing device (Describe) 11/21/2010  6:51 AM    Anti-infectives:  Anti-infectives     Start     Dose/Rate Route Frequency Ordered Stop   11/22/10 0600   Levofloxacin (LEVAQUIN) IVPB 750 mg        750 mg 100 mL/hr over 90 Minutes Intravenous Every 24 hours 11/21/10 0501     11/21/10 0255   Levofloxacin (LEVAQUIN) 750 MG/150ML IVPB     Comments: [name]: cabinet override         11/21/10 0255 11/21/10 1459   11/21/10 0215   Levofloxacin (LEVAQUIN) IVPB 750 mg  Status:  Discontinued        750 mg 100 mL/hr over 90 Minutes Intravenous Every 24 hours 11/21/10 0209 11/21/10 0502          Microbiology: Results for orders placed during the hospital encounter of 11/20/10  MRSA PCR SCREENING     Status: Normal   Collection Time   11/21/10  7:11 AM      Component Value Range Status Comment   MRSA by PCR NEGATIVE  NEGATIVE  Final     Best Practice/Protocols:  VTE Prophylaxis: Heparin (SQ) nonpreg adult SSI 11/10 >>  Events: 11/10: tolerating off BiPAP and [place] O2 without distress  Studies: Dg Chest Portable 1 View: Vasc congestion. Mild edema   Consults: none     Subjective:    Overnight Issues:   Objective:  Vital signs for last 24 hours: Temp:  [97.9 F (36.6 C)-98.9 F (37.2 C)] 98.3 F (36.8 C) (11/10 0800) Pulse Rate:  [60-130] 109  (11/10 1000) Resp:  [13-27] 18  (11/10 1000) BP: (102-158)/(37-120) 158/61 mmHg (11/10 1000) SpO2:  [88 %-100 %] 97 % (11/10 1000) FiO2 (%):  [30 %-50 %] 30 % (11/10 0430) Weight:  [119.5 kg (263 lb 7.2 oz)] 263 lb 7.2 oz (119.5 kg) (11/10 4098)  Hemodynamic parameters for last 24 hours:    Intake/Output from  previous day: 11/09 0701 - 11/10 0700 In: 20 [I.V.:20] Out: 425 [Urine:425]  Intake/Output this shift: Total I/O In: 335 [P.O.:250; I.V.:35; IV Piggyback:50] Out: 400 [Urine:400]  Vent settings for last 24 hours: Vent Mode:  [-]  FiO2 (%):  [30 %-50 %] 30 %  Physical Exam:  General: alert and no respiratory distress  Assessment/Plan:  Principal Problem:  *Acute respiratory failure Due to copd/AB and perhaps component of CHF Cont BDs, diuresis, abx, PRN BiPAP F/U CXR 11/11. Check pro BNP in AM  Active Problems:  DIABETES MELLITUS, TYPE II Begin CHO modified diet. She takes 60 units 70/30 insulin BID @ home. Will start back @ 40 units BID to avoid hypoglycemia Nonpreg adult SSI ordered\  ANXIETY Takes Xanax @ home. Resume   RESPIRATORY FAILURE, CHRONIC Chronic COPD - see above   LOS: 1 day   Billy Fischer 11/21/2010  *Care during the described time interval was provided by me and/or other providers on the critical care team.  I have reviewed this patient's available data, including medical history, events of note, physical examination and test results as part of my evaluation.

## 2010-11-22 ENCOUNTER — Inpatient Hospital Stay (HOSPITAL_COMMUNITY): Payer: Medicare Other

## 2010-11-22 DIAGNOSIS — I509 Heart failure, unspecified: Secondary | ICD-10-CM

## 2010-11-22 LAB — URINE CULTURE
Colony Count: NO GROWTH
Culture: NO GROWTH

## 2010-11-22 LAB — GLUCOSE, CAPILLARY
Glucose-Capillary: 60 mg/dL — ABNORMAL LOW (ref 70–99)
Glucose-Capillary: 99 mg/dL (ref 70–99)

## 2010-11-22 LAB — BASIC METABOLIC PANEL
BUN: 24 mg/dL — ABNORMAL HIGH (ref 6–23)
Creatinine, Ser: 1.3 mg/dL — ABNORMAL HIGH (ref 0.50–1.10)
GFR calc non Af Amer: 42 mL/min — ABNORMAL LOW (ref >90)
Glucose, Bld: 106 mg/dL — ABNORMAL HIGH (ref 70–99)
Potassium: 4 mEq/L (ref 3.5–5.1)

## 2010-11-22 LAB — DIFFERENTIAL
Basophils Absolute: 0 10*3/uL (ref 0.0–0.1)
Basophils Relative: 0 % (ref 0–1)
Monocytes Relative: 7 % (ref 3–12)
Neutro Abs: 6 10*3/uL (ref 1.7–7.7)
Neutrophils Relative %: 76 % (ref 43–77)

## 2010-11-22 LAB — CBC
HCT: 32.9 % — ABNORMAL LOW (ref 36.0–46.0)
Hemoglobin: 9.9 g/dL — ABNORMAL LOW (ref 12.0–15.0)
MCH: 27 pg (ref 26.0–34.0)
MCHC: 30.1 g/dL (ref 30.0–36.0)
MCV: 89.9 fL (ref 78.0–100.0)
RDW: 15.3 % (ref 11.5–15.5)

## 2010-11-22 MED ORDER — ACETAMINOPHEN 325 MG PO TABS
650.0000 mg | ORAL_TABLET | ORAL | Status: DC | PRN
  Administered 2010-11-22 – 2010-11-30 (×9): 650 mg via ORAL
  Filled 2010-11-22 (×9): qty 2

## 2010-11-22 MED ORDER — BUDESONIDE 0.25 MG/2ML IN SUSP
0.2500 mg | Freq: Four times a day (QID) | RESPIRATORY_TRACT | Status: DC
  Administered 2010-11-22 – 2010-11-26 (×15): 0.25 mg via RESPIRATORY_TRACT
  Filled 2010-11-22 (×23): qty 2

## 2010-11-22 MED ORDER — HYDROCODONE-ACETAMINOPHEN 7.5-325 MG PO TABS
ORAL_TABLET | ORAL | Status: AC
  Filled 2010-11-22: qty 2

## 2010-11-22 MED ORDER — LEVOFLOXACIN 500 MG PO TABS
500.0000 mg | ORAL_TABLET | Freq: Every day | ORAL | Status: AC
  Administered 2010-11-22 – 2010-11-26 (×5): 500 mg via ORAL
  Filled 2010-11-22 (×5): qty 1

## 2010-11-22 MED ORDER — ALPRAZOLAM 0.5 MG PO TABS
0.5000 mg | ORAL_TABLET | Freq: Three times a day (TID) | ORAL | Status: DC | PRN
  Administered 2010-11-23 – 2010-12-01 (×15): 0.5 mg via ORAL
  Filled 2010-11-22 (×15): qty 1

## 2010-11-22 MED ORDER — GUAIFENESIN 100 MG/5ML PO SOLN
5.0000 mL | ORAL | Status: DC | PRN
  Filled 2010-11-22: qty 10

## 2010-11-22 MED ORDER — ASPIRIN EC 325 MG PO TBEC
325.0000 mg | DELAYED_RELEASE_TABLET | Freq: Every day | ORAL | Status: DC
  Administered 2010-11-22 – 2010-12-01 (×10): 325 mg via ORAL
  Filled 2010-11-22 (×11): qty 1

## 2010-11-22 NOTE — Progress Notes (Signed)
  Echocardiogram 2D Echocardiogram has been performed.  [name] 11/22/2010, 11:33 AM

## 2010-11-22 NOTE — Progress Notes (Signed)
Follow up - Critical Care Medicine Note  Patient Details:    [name] is an 65 y.o. female.  Lines, Airways, Drains: Urethral Catheter Temperature probe 14 Fr. (Active)  Site Assessment Clean;Intact 11/21/2010  6:51 AM  Collection Container Standard drainage bag 11/21/2010  6:51 AM  Securement Method Securing device (Describe) 11/21/2010  6:51 AM    Anti-infectives:  Anti-infectives     Start     Dose/Rate Route Frequency Ordered Stop   11/22/10 0600   Levofloxacin (LEVAQUIN) IVPB 750 mg        750 mg 100 mL/hr over 90 Minutes Intravenous Every 24 hours 11/21/10 0501     11/21/10 0255   Levofloxacin (LEVAQUIN) 750 MG/150ML IVPB     Comments: [name]: cabinet override         11/21/10 0255 11/21/10 1459   11/21/10 0215   Levofloxacin (LEVAQUIN) IVPB 750 mg  Status:  Discontinued        750 mg 100 mL/hr over 90 Minutes Intravenous Every 24 hours 11/21/10 0209 11/21/10 0502          Microbiology: Results for orders placed during the hospital encounter of 11/20/10  MRSA PCR SCREENING     Status: Normal   Collection Time   11/21/10  7:11 AM      Component Value Range Status Comment   MRSA by PCR NEGATIVE  NEGATIVE  Final     Best Practice/Protocols:  VTE Prophylaxis: Heparin (SQ) nonpreg adult SSI 11/10 >>  Events: 11/10: tolerating off BiPAP and  O2 without distress 11/11: more dyspneic  Studies: Dg Chest Portable 1 View: increased edema   Consults: none     Subjective:    Overnight Issues: required NPPV briefly overnight. RN reports some purulent sputum. Pt c/o increased dyspnea  Objective:  Vital signs for last 24 hours: Temp:  [97.6 F (36.4 C)-99.2 F (37.3 C)] 98.2 F (36.8 C) (11/11 0800) Pulse Rate:  [109-124] 124  (11/10 2011) Resp:  [17-25] 22  (11/10 2011) BP: (129-173)/(45-99) 155/86 mmHg (11/11 0400) SpO2:  [92 %-100 %] 97 % (11/11 0751) Weight:  [116 kg (255 lb 11.7 oz)] 255 lb 11.7 oz (116 kg) (11/11 0000)     Intake/Output from previous day: 11/10 0701 - 11/11 0700 In: 1618 [P.O.:1330; I.V.:238; IV Piggyback:50] Out: 2891 [Urine:2890; Stool:1]   Physical Exam:  General: alert and no respiratory distress Mildly dyspneic @ rest HEENT WNL JVP not visulaized Diminished BS throughout, no wheezes, bibasilar rales RRR s M Obese, soft, NT, + BS 1+ symmetric ankle edema   Assessment/Plan:  Principal Problem:  *Acute respiratory failure Suspect CHF > COPD/AB now based on CXR, exam findings Cont PRN BiPAP Cont diuresis Cont nebs Add nebulized steroids Change Abx to PO F/U CXR 11/12. Recheck pro BNP in AM  Active Problems:  DIABETES MELLITUS, TYPE II Well controlled Cont CHO modified diet.  She takes 60 units 70/30 insulin BID @ home. Will cont @ 40 units BID to avoid hypoglycemia Cont nonpreg adult SSI ordered   ANXIETY Takes Xanax @ home. Cont   RESPIRATORY FAILURE, CHRONIC Chronic COPD - see above   LOS: 2 days   Billy Fischer 11/22/2010  *Care during the described time interval was provided by me and/or other providers on the critical care team.  I have reviewed this patient's available data, including medical history, events of note, physical examination and test results as part of my evaluation.

## 2010-11-22 NOTE — Progress Notes (Signed)
Pt CBG 60 reported by Esmeralda Arthur, NT.  Patient given 120 ml of orange juice and graham crackers.  Will reassess in 15-30 minutes.  Leonarda Salon, RN, BSN

## 2010-11-23 ENCOUNTER — Inpatient Hospital Stay (HOSPITAL_COMMUNITY): Payer: Medicare Other

## 2010-11-23 DIAGNOSIS — E662 Morbid (severe) obesity with alveolar hypoventilation: Secondary | ICD-10-CM

## 2010-11-23 DIAGNOSIS — J449 Chronic obstructive pulmonary disease, unspecified: Secondary | ICD-10-CM

## 2010-11-23 LAB — BASIC METABOLIC PANEL
CO2: 37 mEq/L — ABNORMAL HIGH (ref 19–32)
Chloride: 94 mEq/L — ABNORMAL LOW (ref 96–112)
Glucose, Bld: 111 mg/dL — ABNORMAL HIGH (ref 70–99)
Potassium: 4.5 mEq/L (ref 3.5–5.1)
Sodium: 137 mEq/L (ref 135–145)

## 2010-11-23 LAB — GLUCOSE, CAPILLARY
Glucose-Capillary: 148 mg/dL — ABNORMAL HIGH (ref 70–99)
Glucose-Capillary: 56 mg/dL — ABNORMAL LOW (ref 70–99)
Glucose-Capillary: 105 mg/dL — ABNORMAL HIGH (ref 70–99)

## 2010-11-23 MED ORDER — DEXTROSE 50 % IV SOLN
INTRAVENOUS | Status: AC
  Administered 2010-11-23: 25 mL via INTRAVENOUS
  Filled 2010-11-23: qty 50

## 2010-11-23 MED ORDER — INSULIN ASPART PROT & ASPART (70-30 MIX) 100 UNIT/ML ~~LOC~~ SUSP
30.0000 [IU] | Freq: Two times a day (BID) | SUBCUTANEOUS | Status: DC
  Administered 2010-11-23 – 2010-11-24 (×2): 30 [IU] via SUBCUTANEOUS

## 2010-11-23 NOTE — Significant Event (Signed)
Pt. Had CBG of 56- was given 1/2amp D50 and snack. CBG rechecked 20 mins later and it was 105

## 2010-11-23 NOTE — Progress Notes (Signed)
Attending Addendum:  I have seen the patient, discussed the issues, test results and plans with S. Minor, NP. I agree with the Assessment and Plans as outlined above.  Dhruva Orndoff S. 11/23/2010

## 2010-11-23 NOTE — Progress Notes (Signed)
Follow up - Critical Care Medicine Note  Patient Details:    [name] is an 65 y.o. female.  Lines, Airways, Drains: Urethral Catheter Temperature probe 14 Fr. (Active)  Site Assessment Clean;Intact 11/21/2010  6:51 AM  Collection Container Standard drainage bag 11/21/2010  6:51 AM  Securement Method Securing device (Describe) 11/21/2010  6:51 AM    Anti-infectives:  Anti-infectives     Start     Dose/Rate Route Frequency Ordered Stop   11/22/10 0826   levofloxacin (LEVAQUIN) tablet 500 mg        500 mg Oral Daily 11/22/10 0834 11/27/10 0959   11/22/10 0600   Levofloxacin (LEVAQUIN) IVPB 750 mg  Status:  Discontinued        750 mg 100 mL/hr over 90 Minutes Intravenous Every 24 hours 11/21/10 0501 11/22/10 0834   11/21/10 0255   Levofloxacin (LEVAQUIN) 750 MG/150ML IVPB     Comments: [name]: cabinet override         11/21/10 0255 11/21/10 1459   11/21/10 0215   Levofloxacin (LEVAQUIN) IVPB 750 mg  Status:  Discontinued        750 mg 100 mL/hr over 90 Minutes Intravenous Every 24 hours 11/21/10 0209 11/21/10 0502          Microbiology: Results for orders placed during the hospital encounter of 11/20/10  URINE CULTURE     Status: Normal   Collection Time   11/21/10 12:48 AM      Component Value Range Status Comment   Specimen Description URINE, CATHETERIZED   Final    Special Requests Normal   Final    Setup Time [id number]   Final    Colony Count NO GROWTH   Final    Culture NO GROWTH   Final    Report Status 11/22/2010 FINAL   Final   MRSA PCR SCREENING     Status: Normal   Collection Time   11/21/10  7:11 AM      Component Value Range Status Comment   MRSA by PCR NEGATIVE  NEGATIVE  Final     Best Practice/Protocols:  VTE Prophylaxis: Heparin (SQ) nonpreg adult SSI 11/10 >>  Events: 11/10: tolerating off BiPAP and [place] O2 without distress 11/11: more dyspneic 11/12 eating  Studies: Dg Chest Portable 1 View  11/23/2010  *RADIOLOGY  REPORT*  Clinical Data: Follow up congestive heart failure.  PORTABLE CHEST - 1 VIEW  Comparison: Chest radiograph performed 11/22/2010  Findings: The lungs are well-aerated.  Bibasilar airspace opacities are not significantly changed in appearance, with persistent underlying vascular congestion, reflecting persistent mild interstitial edema.  Small bilateral pleural effusions are likely present.  No pneumothorax is seen.  The cardiomediastinal silhouette remains mildly enlarged.  No acute osseous abnormalities are seen.  IMPRESSION: No significant interval change from the prior study.  Stable bibasilar airspace opacities, with persistent underlying vascular congestion and mild cardiomegaly, likely reflecting persistent mild interstitial edema.  Small bilateral pleural effusions noted.  Original Report Authenticated By: Tonia Ghent, M.D.   Dg Chest Portable 1 View  11/22/2010  *RADIOLOGY REPORT*  Clinical Data: CHF  PORTABLE CHEST - 1 VIEW  Comparison: 11/20/2010; 09/29/2010; chest CT - 09/29/2010  Findings:  Grossly unchanged enlarged cardiac silhouette and mediastinal contours.  The pulmonary vasculature remains indistinct.  Increased conspicuity of the pulmonary interstitium with suggestion of a nodular pattern.  No change to minimal increase bibasilar opacities, possibly atelectasis.  Persistent blunting of bilateral costophrenic angles may suggest small effusions.  No definite pneumothorax.  Unchanged bones.  IMPRESSION:  Increased conspicuity pulmonary interstitium with slight nodular pattern may represent pulmonary edema, though an atypical infectious process may have a similar appearance.  Clinical correlation is advised.  Original Report Authenticated By: Waynard Reeds, M.D.     Consults: none     Subjective:    Overnight Issues: off bipap  Objective:  Vital signs for last 24 hours: Temp:  [97.3 F (36.3 C)-98.4 F (36.9 C)] 97.3 F (36.3 C) (11/12 0900) Pulse Rate:  [88-115] 101   (11/12 0900) Resp:  [15-30] 24  (11/12 0900) BP: (100-153)/(46-90) 131/57 mmHg (11/12 0900) SpO2:  [90 %-100 %] 90 % (11/12 0900) Weight:  [265 lb 14 oz (120.6 kg)] 265 lb 14 oz (120.6 kg) (11/12 0000)    Intake/Output from previous day: 11/11 0701 - 11/12 0700 In: 556 [P.O.:360; I.V.:180; IV Piggyback:16] Out: 3070 [Urine:3070]   Physical Exam:  General: alert and no respiratory distress Mildly dyspneic @ rest HEENT WNL JVP not visulaized Diminished BS throughout, no wheezes, bibasilar rales RRR s M Obese, soft, NT, + BS 1+ symmetric ankle edema   Assessment/Plan:  Principal Problem:  *Acute respiratory failure Suspect CHF > COPD/AB now based on CXR, exam findings Cont PRN BiPAP Cont diuresis Cont nebs Add nebulized steroids Changed Abx to PO F/U CXR 11/12. Recheck pro BNP in AM  Active Problems:  DIABETES MELLITUS, TYPE II Hypoglycemia during night Cont CHO modified diet.  She takes 60 units 70/30 insulin BID @ home. Will decreaset to 30 units BID to avoid hypoglycemia Cont nonpreg adult SSI ordered   ANXIETY Takes Xanax @ home. Cont   RESPIRATORY FAILURE, CHRONIC Chronic COPD - see above   LOS: 3 days   [name] S 11/23/2010  *Care during the described time interval was provided by me and/or other providers on the critical care team.  I have reviewed this patient's available data, including medical history, events of note, physical examination and test results as part of my evaluation.

## 2010-11-23 NOTE — Plan of Care (Signed)
Problem: Phase I Progression Outcomes Goal: Flu/PneumoVaccines if indicated Outcome: Not Applicable Date Met:  11/23/10 Pt. Refuses both flu and pneumonia vaccines.

## 2010-11-23 NOTE — Plan of Care (Signed)
Problem: Phase I Progression Outcomes Goal: Discharge plan established Outcome: Adequate for Discharge Pt. Plan is to be transferred to home

## 2010-11-24 ENCOUNTER — Inpatient Hospital Stay (HOSPITAL_COMMUNITY): Payer: Medicare Other

## 2010-11-24 LAB — GLUCOSE, CAPILLARY
Glucose-Capillary: 105 mg/dL — ABNORMAL HIGH (ref 70–99)
Glucose-Capillary: 139 mg/dL — ABNORMAL HIGH (ref 70–99)
Glucose-Capillary: 171 mg/dL — ABNORMAL HIGH (ref 70–99)
Glucose-Capillary: 230 mg/dL — ABNORMAL HIGH (ref 70–99)

## 2010-11-24 LAB — CBC
HCT: 31.5 % — ABNORMAL LOW (ref 36.0–46.0)
Hemoglobin: 9.4 g/dL — ABNORMAL LOW (ref 12.0–15.0)
RBC: 3.52 MIL/uL — ABNORMAL LOW (ref 3.87–5.11)
RDW: 15.3 % (ref 11.5–15.5)
WBC: 6.6 10*3/uL (ref 4.0–10.5)

## 2010-11-24 LAB — MAGNESIUM: Magnesium: 1.8 mg/dL (ref 1.5–2.5)

## 2010-11-24 MED ORDER — INSULIN ASPART 100 UNIT/ML ~~LOC~~ SOLN
2.0000 [IU] | Freq: Three times a day (TID) | SUBCUTANEOUS | Status: DC
  Administered 2010-11-25 – 2010-12-01 (×13): 2 [IU] via SUBCUTANEOUS

## 2010-11-24 MED ORDER — INSULIN ASPART PROT & ASPART (70-30 MIX) 100 UNIT/ML ~~LOC~~ SUSP
30.0000 [IU] | Freq: Every day | SUBCUTANEOUS | Status: DC
  Administered 2010-11-25 – 2010-12-01 (×7): 30 [IU] via SUBCUTANEOUS
  Filled 2010-11-24: qty 3

## 2010-11-24 NOTE — Progress Notes (Signed)
Follow up - Critical Care Medicine Note  Patient Details:    [name] is an 65 y.o. female.  Lines, Airways, Drains: Urethral Catheter Temperature probe 14 Fr. (Active)  Site Assessment Clean;Intact 11/21/2010  6:51 AM  Collection Container Standard drainage bag 11/21/2010  6:51 AM  Securement Method Securing device (Describe) 11/21/2010  6:51 AM    Anti-infectives:  Anti-infectives     Start     Dose/Rate Route Frequency Ordered Stop   11/22/10 0826   levofloxacin (LEVAQUIN) tablet 500 mg        500 mg Oral Daily 11/22/10 0834 11/27/10 0959   11/22/10 0600   Levofloxacin (LEVAQUIN) IVPB 750 mg  Status:  Discontinued        750 mg 100 mL/hr over 90 Minutes Intravenous Every 24 hours 11/21/10 0501 11/22/10 0834   11/21/10 0255   Levofloxacin (LEVAQUIN) 750 MG/150ML IVPB     Comments: [name]: cabinet override         11/21/10 0255 11/21/10 1459   11/21/10 0215   Levofloxacin (LEVAQUIN) IVPB 750 mg  Status:  Discontinued        750 mg 100 mL/hr over 90 Minutes Intravenous Every 24 hours 11/21/10 0209 11/21/10 0502          Microbiology: Results for orders placed during the hospital encounter of 11/20/10  URINE CULTURE     Status: Normal   Collection Time   11/21/10 12:48 AM      Component Value Range Status Comment   Specimen Description URINE, CATHETERIZED   Final    Special Requests Normal   Final    Setup Time [id number]   Final    Colony Count NO GROWTH   Final    Culture NO GROWTH   Final    Report Status 11/22/2010 FINAL   Final   MRSA PCR SCREENING     Status: Normal   Collection Time   11/21/10  7:11 AM      Component Value Range Status Comment   MRSA by PCR NEGATIVE  NEGATIVE  Final     Best Practice/Protocols:  VTE Prophylaxis: Heparin (SQ) nonpreg adult SSI 11/10 >>  Events: 11/10: tolerating off BiPAP and [place] O2 without distress 11/11: more dyspneic 11/12 eating  Studies: Dg Chest Portable 1 View  11/24/2010  *RADIOLOGY  REPORT*  Clinical Data: COPD  PORTABLE CHEST - 1 VIEW  Comparison: 11/23/2010; 11/22/2010; 11/20/2010  Findings: Grossly unchanged enlarged cardiac silhouette and mediastinal contours.  Lung volumes remain reduced.  There is persistent cephalization of flow and bibasilar heterogeneous opacities.  Query small bilateral effusions.  No definite pneumothorax.  Unchanged bones.  IMPRESSION: Grossly unchanged findings of pulmonary edema and basilar opacities, likely atelectasis.  Original Report Authenticated By: Waynard Reeds, M.D.   Dg Chest Portable 1 View  11/23/2010  *RADIOLOGY REPORT*  Clinical Data: Follow up congestive heart failure.  PORTABLE CHEST - 1 VIEW  Comparison: Chest radiograph performed 11/22/2010  Findings: The lungs are well-aerated.  Bibasilar airspace opacities are not significantly changed in appearance, with persistent underlying vascular congestion, reflecting persistent mild interstitial edema.  Small bilateral pleural effusions are likely present.  No pneumothorax is seen.  The cardiomediastinal silhouette remains mildly enlarged.  No acute osseous abnormalities are seen.  IMPRESSION: No significant interval change from the prior study.  Stable bibasilar airspace opacities, with persistent underlying vascular congestion and mild cardiomegaly, likely reflecting persistent mild interstitial edema.  Small bilateral pleural effusions noted.  Original  Report Authenticated By: Tonia Ghent, M.D.     Consults: none     Subjective:    Overnight Issues: off bipap, decreased wob  Objective:  Vital signs for last 24 hours: Temp:  [97.7 F (36.5 C)-99.1 F (37.3 C)] 98.1 F (36.7 C) (11/13 0800) Pulse Rate:  [86-107] 94  (11/13 0800) Resp:  [15-26] 20  (11/13 0800) BP: (102-146)/(37-97) 132/63 mmHg (11/13 0800) SpO2:  [92 %-100 %] 98 % (11/13 0800) Weight:  [264 lb 1.8 oz (119.8 kg)] 264 lb 1.8 oz (119.8 kg) (11/13 0615)    Intake/Output from previous day: 11/12 0701 - 11/13  0700 In: 1298 [P.O.:1080; I.V.:210; IV Piggyback:4] Out: 3415 [Urine:3415]   Physical Exam:  General: alert and no respiratory distress Mildly dyspneic @ rest HEENT WNL JVP not visulaized Diminished BS throughout, no wheezes, bibasilar rales RRR s M Obese, soft, NT, + BS 1+ symmetric ankle edema   Assessment/Plan:  Principal Problem:  *Acute respiratory failure Suspect CHF > COPD/AB now based on CXR, exam findings Cont PRN BiPAP Cont diuresis Cont nebs Add nebulized steroids Changed Abx to PO F/U CXR 11/13. Recheck pro BNP in AM  Active Problems:  DIABETES MELLITUS, TYPE II Hypoglycemia during night Cont CHO modified diet.  She takes 60 units 70/30 insulin BID @ home. Will decreaset to 30 units BID to avoid hypoglycemia Cont nonpreg adult SSI ordered   ANXIETY Takes Xanax @ home. Cont   RESPIRATORY FAILURE, CHRONIC Chronic COPD - see above May need rehab /snf   LOS: 4 days   MINOR,WILLIAM S 11/24/2010  *Care during the described time interval was provided by me and/or other providers on the critical care team.  I have reviewed this patient's available data, including medical history, events of note, physical examination and test results as part of my evaluation.  Attending Addendum:  I have seen the patient, discussed the issues, test results and plans with S. Minor. I agree with the Assessment and Plans as outlined above.  Zavien Clubb S. 11/24/2010 4:52 PM

## 2010-11-24 NOTE — Progress Notes (Signed)
Inpatient Diabetes Program Recommendations  AACE/ADA: New Consensus Statement on Inpatient Glycemic Control (2009)  Target Ranges:  Prepandial:   less than 140 mg/dL      Peak postprandial:   less than 180 mg/dL (1-2 hours)      Critically ill patients:  140 - 180 mg/dL   Reason for Visit: Hypoglycemia Inpatient Diabetes Program Recommendations Insulin - Meal Coverage: D/C meal coverage insulin since pt. on 70/30.  Note: Hypoglycemia on 11/12 and 11/13.  70/30 decreased to 20 units bid.  Please d/c meal cov. Insulin per glycemic control rec.

## 2010-11-24 NOTE — Progress Notes (Signed)
CSW NOTE: Full Assessment in chart in [facility]. CSW met with Pt due to referral for SNF. Pt lives with her daughter and three grandchildren in her daughter's home. Per Pt. There are currently many stressors at home and Pt is not sure if she wants to return to her daughter's house at d/c. Pt will need PT/OT consult to determine needs. Pt uses walker and has HH through [facility] in the past. Pt conflicted and stressed. CSW provided support and discussed ways for Pt. to discuss current concerns with  Daughter. CSW will follow and assist with d/c plan as appropriate.  Vennie Homans, [place]  11/24/2010 11:05 AM [phone]

## 2010-11-24 NOTE — Progress Notes (Signed)
CBG: 57  Treatment: 15 GM carbohydrate snack  Symptoms: None  Follow-up CBG: Time:1205 CBG Result:77  Possible Reasons for Event: Unknown  Comments/MD notified:SSI and meal coverage held, additional 15g CHO snack given    Janelle Floor

## 2010-11-25 ENCOUNTER — Inpatient Hospital Stay (HOSPITAL_COMMUNITY): Payer: Medicare Other

## 2010-11-25 LAB — BASIC METABOLIC PANEL
CO2: 43 mEq/L (ref 19–32)
Calcium: 9.2 mg/dL (ref 8.4–10.5)
GFR calc non Af Amer: 27 mL/min — ABNORMAL LOW (ref >90)
Sodium: 138 mEq/L (ref 135–145)

## 2010-11-25 LAB — GLUCOSE, CAPILLARY: Glucose-Capillary: 126 mg/dL — ABNORMAL HIGH (ref 70–99)

## 2010-11-25 LAB — CBC
MCV: 91.3 fL (ref 78.0–100.0)
Platelets: 236 10*3/uL (ref 150–400)
RBC: 3.35 MIL/uL — ABNORMAL LOW (ref 3.87–5.11)
WBC: 5.8 10*3/uL (ref 4.0–10.5)

## 2010-11-25 NOTE — Progress Notes (Signed)
Follow up - Critical Care Medicine Note  Patient Details:    [name] is an 65 y.o. female.  Lines, Airways, Drains: Urethral Catheter Temperature probe 14 Fr. (Active)  Site Assessment Clean;Intact 11/21/2010  6:51 AM  Collection Container Standard drainage bag 11/21/2010  6:51 AM  Securement Method Securing device (Describe) 11/21/2010  6:51 AM    Anti-infectives:  Anti-infectives     Start     Dose/Rate Route Frequency Ordered Stop   11/22/10 0826   levofloxacin (LEVAQUIN) tablet 500 mg        500 mg Oral Daily 11/22/10 0834 11/27/10 0959   11/22/10 0600   Levofloxacin (LEVAQUIN) IVPB 750 mg  Status:  Discontinued        750 mg 100 mL/hr over 90 Minutes Intravenous Every 24 hours 11/21/10 0501 11/22/10 0834   11/21/10 0255   Levofloxacin (LEVAQUIN) 750 MG/150ML IVPB     Comments: [name]: cabinet override         11/21/10 0255 11/21/10 1459   11/21/10 0215   Levofloxacin (LEVAQUIN) IVPB 750 mg  Status:  Discontinued        750 mg 100 mL/hr over 90 Minutes Intravenous Every 24 hours 11/21/10 0209 11/21/10 0502          Microbiology: Results for orders placed during the hospital encounter of 11/20/10  URINE CULTURE     Status: Normal   Collection Time   11/21/10 12:48 AM      Component Value Range Status Comment   Specimen Description URINE, CATHETERIZED   Final    Special Requests Normal   Final    Setup Time [id number]   Final    Colony Count NO GROWTH   Final    Culture NO GROWTH   Final    Report Status 11/22/2010 FINAL   Final   MRSA PCR SCREENING     Status: Normal   Collection Time   11/21/10  7:11 AM      Component Value Range Status Comment   MRSA by PCR NEGATIVE  NEGATIVE  Final     Best Practice/Protocols:  VTE Prophylaxis: Heparin (SQ) nonpreg adult SSI 11/10 >>  Events: 11/10: tolerating off BiPAP and [place] O2 without distress 11/11: more dyspneic 11/12 eating  Studies: Dg Chest Port 1 View  11/25/2010  *RADIOLOGY REPORT*   Clinical Data: COPD, follow-up  PORTABLE CHEST - 1 VIEW  Comparison: Portable chest x-ray of 11/24/2010  Findings: There is little change in mild pulmonary vascular congestion and mild basilar volume loss.  Cardiomegaly is stable. No bony abnormality is seen.  IMPRESSION: Little change in mild pulmonary vascular congestion.  Original Report Authenticated By: Juline Patch, M.D.   Dg Chest Portable 1 View  11/24/2010  *RADIOLOGY REPORT*  Clinical Data: COPD  PORTABLE CHEST - 1 VIEW  Comparison: 11/23/2010; 11/22/2010; 11/20/2010  Findings: Grossly unchanged enlarged cardiac silhouette and mediastinal contours.  Lung volumes remain reduced.  There is persistent cephalization of flow and bibasilar heterogeneous opacities.  Query small bilateral effusions.  No definite pneumothorax.  Unchanged bones.  IMPRESSION: Grossly unchanged findings of pulmonary edema and basilar opacities, likely atelectasis.  Original Report Authenticated By: Waynard Reeds, M.D.     Consults: none     Subjective:    Overnight Issues: off bipap, decreased wob  Objective:  Vital signs for last 24 hours: Temp:  [97.7 F (36.5 C)-98.4 F (36.9 C)] 97.7 F (36.5 C) (11/14 0500) Pulse Rate:  [87-99] 93  (  11/14 0500) Resp:  [14-19] 18  (11/14 0500) BP: (107-142)/(44-67) 142/44 mmHg (11/14 0500) SpO2:  [93 %-100 %] 95 % (11/14 0500) Weight:  [260 lb 2.3 oz (118 kg)] 260 lb 2.3 oz (118 kg) (11/14 0500)    Intake/Output from previous day: 11/13 0701 - 11/14 0700 In: 1558 [P.O.:1320; I.V.:230; IV Piggyback:8] Out: 3770 [Urine:3770]   Physical Exam:  General: alert and no respiratory distress Mildly dyspneic @ rest HEENT WNL JVP not visulaized Diminished BS throughout, no wheezes, bibasilar rales RRR s M Obese, soft, NT, + BS 1+ symmetric ankle edema   Assessment/Plan:  Principal Problem:  *Acute respiratory failure Suspect CHF > COPD/AB now based on CXR, exam findings dc BiPAP Cont diuresis Cont  nebs Added nebulized steroids Changed Abx to PO.   Active Problems:  DIABETES MELLITUS, TYPE II Hypoglycemia during night Cont CHO modified diet.  She takes 60 units 70/30 insulin BID @ home. Will decreaset to 30 units BID to avoid hypoglycemia Cont nonpreg adult SSI ordered   ANXIETY Takes Xanax @ home. Cont   RESPIRATORY FAILURE, CHRONIC Chronic COPD - see above May need rehab /snf   LOS: 5 days   MINOR,WILLIAM S 11/25/2010  *Care during the described time interval was provided by me and/or other providers on the critical care team.  I have reviewed this patient's available data, including medical history, events of note, physical examination and test results as part of my evaluation.  Attending Addendum:  I have seen the patient, discussed the issues, test results and plans with S. Minor. I agree with the Assessment and Plans as outlined above.  Burnett Spray S. 11/25/2010 4:34 PM

## 2010-11-25 NOTE — Progress Notes (Signed)
CBG: 50  Treatment: 8oz OJ with dinner  Symptoms: None  Follow-up CBG: Time:2030 CBG Result:171  Possible Reasons for Event: Inadequate meal intake  Comments/MD notified:Dr Deterding    Seward Meth

## 2010-11-25 NOTE — Progress Notes (Signed)
Focus and LOS updated. 

## 2010-11-25 NOTE — Progress Notes (Signed)
CM reviewed CM referral for LTAC.  Clydie Braun at [facility] contacted with pt. Information.  Will follow.

## 2010-11-25 NOTE — Progress Notes (Signed)
CRITICAL VALUE ALERT  Critical value received:  CO2 43  Date of notification:  11/25/10  Time of notification:  0435  Critical value read back:yes  Nurse who received alert:  Demetria Pore  MD notified (1st page):  Deterding  Time of first page:  [phone]

## 2010-11-26 LAB — GLUCOSE, CAPILLARY
Glucose-Capillary: 128 mg/dL — ABNORMAL HIGH (ref 70–99)
Glucose-Capillary: 108 mg/dL — ABNORMAL HIGH (ref 70–99)
Glucose-Capillary: 145 mg/dL — ABNORMAL HIGH (ref 70–99)
Glucose-Capillary: 111 mg/dL — ABNORMAL HIGH (ref 70–99)

## 2010-11-26 LAB — CBC
HCT: 31.1 % — ABNORMAL LOW (ref 36.0–46.0)
Hemoglobin: 9.2 g/dL — ABNORMAL LOW (ref 12.0–15.0)
MCV: 89.6 fL (ref 78.0–100.0)
RBC: 3.47 MIL/uL — ABNORMAL LOW (ref 3.87–5.11)
RDW: 15.3 % (ref 11.5–15.5)
WBC: 6.1 10*3/uL (ref 4.0–10.5)

## 2010-11-26 LAB — BASIC METABOLIC PANEL
CO2: 43 mEq/L (ref 19–32)
Chloride: 89 mEq/L — ABNORMAL LOW (ref 96–112)
Creatinine, Ser: 2 mg/dL — ABNORMAL HIGH (ref 0.50–1.10)
GFR calc Af Amer: 29 mL/min — ABNORMAL LOW (ref >90)
Potassium: 5.6 mEq/L — ABNORMAL HIGH (ref 3.5–5.1)

## 2010-11-26 LAB — PRO B NATRIURETIC PEPTIDE: Pro B Natriuretic peptide (BNP): 2932 pg/mL — ABNORMAL HIGH (ref 0–125)

## 2010-11-26 NOTE — Progress Notes (Signed)
CSW following for d/c planning. Pt still not seen by PT/OT for eval that was ordered on 11/24/10. This is needed for help with dispo and d/c planning. CSW will continue to follow and remind team of need for PT/OT follow up. Pt was evaluated for LTAC and could qualify  -Doreen Salvage, LCSWA [phone]

## 2010-11-26 NOTE — Progress Notes (Signed)
Follow up - Critical Care Medicine Note  Patient Details:    [name] is an 65 y.o. female.  Lines, Airways, Drains: Urethral Catheter Temperature probe 14 Fr. (Active)  Site Assessment Clean;Intact 11/21/2010  6:51 AM  Collection Container Standard drainage bag 11/21/2010  6:51 AM  Securement Method Securing device (Describe) 11/21/2010  6:51 AM    Anti-infectives:  Anti-infectives     Start     Dose/Rate Route Frequency Ordered Stop   11/22/10 0826   levofloxacin (LEVAQUIN) tablet 500 mg        500 mg Oral Daily 11/22/10 0834 11/27/10 0959   11/22/10 0600   Levofloxacin (LEVAQUIN) IVPB 750 mg  Status:  Discontinued        750 mg 100 mL/hr over 90 Minutes Intravenous Every 24 hours 11/21/10 0501 11/22/10 0834   11/21/10 0255   Levofloxacin (LEVAQUIN) 750 MG/150ML IVPB     Comments: [name]: cabinet override         11/21/10 0255 11/21/10 1459   11/21/10 0215   Levofloxacin (LEVAQUIN) IVPB 750 mg  Status:  Discontinued        750 mg 100 mL/hr over 90 Minutes Intravenous Every 24 hours 11/21/10 0209 11/21/10 0502          Microbiology: Results for orders placed during the hospital encounter of 11/20/10  URINE CULTURE     Status: Normal   Collection Time   11/21/10 12:48 AM      Component Value Range Status Comment   Specimen Description URINE, CATHETERIZED   Final    Special Requests Normal   Final    Setup Time [id number]   Final    Colony Count NO GROWTH   Final    Culture NO GROWTH   Final    Report Status 11/22/2010 FINAL   Final   MRSA PCR SCREENING     Status: Normal   Collection Time   11/21/10  7:11 AM      Component Value Range Status Comment   MRSA by PCR NEGATIVE  NEGATIVE  Final     Best Practice/Protocols:  VTE Prophylaxis: Heparin (SQ) nonpreg adult SSI 11/10 >>  Events: 11/10: tolerating off BiPAP and [place] O2 without distress 11/11: more dyspneic 11/12 eating 11/14 orders to tx to floor Studies: Dg Chest Port 1  View  11/25/2010  *RADIOLOGY REPORT*  Clinical Data: COPD, follow-up  PORTABLE CHEST - 1 VIEW  Comparison: Portable chest x-ray of 11/24/2010  Findings: There is little change in mild pulmonary vascular congestion and mild basilar volume loss.  Cardiomegaly is stable. No bony abnormality is seen.  IMPRESSION: Little change in mild pulmonary vascular congestion.  Original Report Authenticated By: Juline Patch, M.D.     Consults: none     Subjective:    Overnight Issues: off bipap, decreased wob  Objective:  Vital signs for last 24 hours: Temp:  [97.7 F (36.5 C)-99.1 F (37.3 C)] 98.4 F (36.9 C) (11/15 0800) Pulse Rate:  [86-100] 88  (11/15 0800) Resp:  [16-23] 16  (11/15 0800) BP: (98-138)/(45-87) 138/87 mmHg (11/15 0800) SpO2:  [92 %-99 %] 97 % (11/15 0800)    Intake/Output from previous day: 11/14 0701 - 11/15 0700 In: 220 [I.V.:220] Out: 5000 [Urine:5000]   Physical Exam:  General: alert and no respiratory distress Mildly dyspneic @ rest HEENT WNL JVP not visulaized Diminished BS throughout, no wheezes, bibasilar rales RRR s M Obese, soft, NT, + BS 1+ symmetric ankle edema  Assessment/Plan:  Principal Problem:  *Acute respiratory failure Suspect CHF > COPD/AB now based on CXR, exam findings dc BiPAP Cont diuresis Cont nebs Added nebulized steroids Changed Abx to PO.   Active Problems:  DIABETES MELLITUS, TYPE II Hypoglycemia during night Cont CHO modified diet.  She takes 60 units 70/30 insulin BID @ home. Will decreaset to 30 units BID to avoid hypoglycemia Cont nonpreg adult SSI ordered   ANXIETY Takes Xanax @ home. Cont   RESPIRATORY FAILURE, CHRONIC Chronic COPD - see above May need rehab /snf Dispostion: We are seeking snf /ltac   LOS: 6 days   MINOR,WILLIAM S 11/26/2010  *Care during the described time interval was provided by me and/or other providers on the critical care team.  I have reviewed this patient's available data,  including medical history, events of note, physical examination and test results as part of my evaluation.  Attending Addendum:  I have seen the patient, discussed the issues, test results and plans with S. Minor. I agree with the Assessment and Plans as outlined above.  Liala Codispoti S. 11/26/2010 2:55 PM

## 2010-11-26 NOTE — Plan of Care (Signed)
Problem: Phase II Progression Outcomes Goal: Discharge plan remains appropriate-arrangements made Outcome: Completed/Met Date Met:  11/26/10 Pt with [facility]...per orders

## 2010-11-26 NOTE — Progress Notes (Signed)
Ur review completed.  Focus note and LOS updated.  See [facility] note in shadow chart.

## 2010-11-26 NOTE — Progress Notes (Addendum)
Physical Therapy Evaluation Patient Details Name: [name] MRN: [id number] DOB: 12/14/1945 Today's Date: 11/26/2010 [id number]  eval 2 Problem List:  Patient Active Problem List  Diagnoses  . DIABETES MELLITUS, TYPE II  . HYPERCHOLESTEROLEMIA  . MORBID OBESITY  . ANXIETY  . CARPAL TUNNEL SYNDROME, BILATERAL  . HYPERTENSION  . COPD  . RESPIRATORY FAILURE, CHRONIC  . BACK PAIN, LUMBAR  . HAND PAIN, LEFT  . EDEMA  . COUGH  . CHEST PAIN  . Nonspecific (abnormal) findings on radiological and other examination of body structure  . ASYMPTOMATIC POSTMENOPAUSAL STATUS  . CT, CHEST, ABNORMAL  . DYSURIA  . FLANK PAIN  . Iron deficiency anemia, unspecified  . Hyperparathyroidism, secondary  . Renal failure  . Unspecified disorder of urethra and urinary tract  . Acute respiratory failure    Past Medical History:  Past Medical History  Diagnosis Date  . RENAL INSUFFICIENCY 10/15/2008  . Edema 06/18/2008  . PNEUMONIA 12/01/2007  . CARPAL TUNNEL SYNDROME, BILATERAL 10/30/2007  . HAND PAIN, LEFT 09/04/2007  . Morbid obesity   . ANXIETY 11/08/2006  . HYPERTENSION 11/08/2006  . DIABETES MELLITUS, TYPE II 11/08/2006  . HYPERCHOLESTEROLEMIA 07/24/2009  . ANEMIA 07/24/2009  . COPD 11/08/2006    -HFA 50% p coaching 10/15/2009  . RESPIRATORY FAILURE, CHRONIC 10/15/2009  . BACK PAIN, LUMBAR 03/07/2009  . CHEST PAIN 07/24/2009  . Restless leg syndrome    Past Surgical History: History reviewed. No pertinent past surgical history.  PT Assessment/Plan/Recommendation PT Assessment Clinical Impression Statement: Pt will benefit from PT to maximize I for next venue PT Recommendation/Assessment: Patient will need skilled PT in the acute care venue PT Problem List: Decreased activity tolerance;Decreased balance;Decreased mobility;Decreased knowledge of use of DME PT Plan PT Frequency: Min 3X/week PT Treatment/Interventions: DME instruction PT Recommendation Follow Up Recommendations:  Skilled nursing facility Equipment Recommended: Defer to next venue PT Goals  Acute Rehab PT Goals PT Goal Formulation: With patient Time For Goal Achievement: 2 weeks Pt will go Supine/Side to Sit: Independently PT Goal: Supine/Side to Sit - Progress: Progressing toward goal Pt will go Sit to Supine/Side: Independently PT Goal: Sit to Supine/Side - Progress: Progressing toward goal Pt will Transfer Sit to Stand/Stand to Sit: with supervision PT Transfer Goal: Sit to Stand/Stand to Sit - Progress: Progressing toward goal Pt will Ambulate: 51 - 150 feet;with least restrictive assistive device PT Goal: Ambulate - Progress: Progressing toward goal  PT Evaluation Precautions/Restrictions  Precautions Precaution Comments: O2 Prior Functioning  Home Living Lives With: Daughter Additional Comments: Pt wasI prior to adm; lives with dtr, her 3 kids and 2 dogs; Prior Function Level of Independence: Independent with gait;Independent with transfers  Vital Signs during PT EVAL Before session HR 93 O2 Sats 98% on 4l [place] RR 15 BP 138/87 DOE 2-3/4 on 4liters; Pt sats down to 89-90% on 4l during amb; pt able to recover with cues for pursed lip breathing;  Cognition Cognition Arousal/Alertness: Awake/alert Overall Cognitive Status: Appears within functional limits for tasks assessed Orientation Level: Oriented to place;Oriented to person Cognition - Other Comments: repeats statements and questions at times Sensation/Coordination   Extremity Assessment RLE Assessment RLE Assessment: Within Functional Limits LLE Assessment LLE Assessment: Within Functional Limits (pt states OA LLE) Mobility (including Balance) Bed Mobility Supine to Sit: 5: Supervision Supine to Sit Details (indicate cue type and reason): verbal cues for safety; Transfers Sit to Stand: 4: Min assist;From bed Sit to Stand Details (indicate cue type and reason): verbal cues  for hand placement Stand to Sit: 4: Min  assist;To chair/3-in-1;With armrests Stand to Sit Details: verbal cues for hand placement and safe technique with RW Ambulation/Gait Ambulation/Gait Assistance: 1: +2 Total assist Ambulation/Gait Assistance Details (indicate cue type and reason): +2 for lines and pt safety; verbal cues for posture, breathing and RW safety Ambulation Distance (Feet): 30 Feet Assistive device: Rolling walker    Exercise    End of Session PT - End of Session Activity Tolerance: Patient tolerated treatment well Patient left: in chair;with call bell in reach General Behavior During Session: [facility] for tasks performed Cognition: [facility] for tasks performed  [facility] 11/26/2010, 12:04 PM

## 2010-11-26 NOTE — Progress Notes (Signed)
Occupational Therapy Evaluation Patient Details Name: [name] MRN: [id number] DOB: 1945/11/11 Today's Date: 11/26/2010  1145 1205 ev2  Problem List:  Patient Active Problem List  Diagnoses  . DIABETES MELLITUS, TYPE II  . HYPERCHOLESTEROLEMIA  . MORBID OBESITY  . ANXIETY  . CARPAL TUNNEL SYNDROME, BILATERAL  . HYPERTENSION  . COPD  . RESPIRATORY FAILURE, CHRONIC  . BACK PAIN, LUMBAR  . HAND PAIN, LEFT  . EDEMA  . COUGH  . CHEST PAIN  . Nonspecific (abnormal) findings on radiological and other examination of body structure  . ASYMPTOMATIC POSTMENOPAUSAL STATUS  . CT, CHEST, ABNORMAL  . DYSURIA  . FLANK PAIN  . Iron deficiency anemia, unspecified  . Hyperparathyroidism, secondary  . Renal failure  . Unspecified disorder of urethra and urinary tract  . Acute respiratory failure    Past Medical History:  Past Medical History  Diagnosis Date  . RENAL INSUFFICIENCY 10/15/2008  . Edema 06/18/2008  . PNEUMONIA 12/01/2007  . CARPAL TUNNEL SYNDROME, BILATERAL 10/30/2007  . HAND PAIN, LEFT 09/04/2007  . Morbid obesity   . ANXIETY 11/08/2006  . HYPERTENSION 11/08/2006  . DIABETES MELLITUS, TYPE II 11/08/2006  . HYPERCHOLESTEROLEMIA 07/24/2009  . ANEMIA 07/24/2009  . COPD 11/08/2006    -HFA 50% p coaching 10/15/2009  . RESPIRATORY FAILURE, CHRONIC 10/15/2009  . BACK PAIN, LUMBAR 03/07/2009  . CHEST PAIN 07/24/2009  . Restless leg syndrome    Past Surgical History: History reviewed. No pertinent past surgical history.  OT Assessment/Plan/Recommendation OT Assessment Clinical Impression Statement: pt would benefit from skilled ot to address the deficits below with supervision goals in acute OT Recommendation/Assessment: Patient will need skilled OT in the acute care venue OT Problem List: Decreased strength;Decreased activity tolerance;Decreased knowledge of use of DME or AE OT Therapy Diagnosis : Generalized weakness OT Plan OT Frequency: Min 1X/week OT  Treatment/Interventions: Self-care/ADL training;Energy conservation;DME and/or AE instruction;Therapeutic activities;Patient/family education OT Recommendation Follow Up Recommendations: Skilled nursing facility Equipment Recommended: Defer to next venue Individuals Consulted Consulted and Agree with Results and Recommendations: Patient OT Goals Acute Rehab OT Goals OT Goal Formulation: With patient Time For Goal Achievement: 2 weeks ADL Goals Pt Will Perform Grooming: with supervision;Standing at sink;Other (comment) (2 tasks) ADL Goal: Grooming - Progress: Progressing toward goals Pt Will Perform Lower Body Bathing: with supervision;with adaptive equipment;Sit to stand from chair ADL Goal: Lower Body Bathing - Progress: Progressing toward goals Pt Will Perform Lower Body Dressing: with supervision;with adaptive equipment;Sit to stand from chair ADL Goal: Lower Body Dressing - Progress: Progressing toward goals Pt Will Transfer to Toilet: with supervision;3-in-1;Ambulation ADL Goal: Toilet Transfer - Progress: Progressing toward goals Pt Will Perform Toileting - Clothing Manipulation: with supervision;Sitting on 3-in-1 or toilet ADL Goal: Toileting - Clothing Manipulation - Progress: Progressing toward goals Pt Will Perform Toileting - Hygiene: with supervision;Standing at 3-in-1/toilet ADL Goal: Toileting - Hygiene - Progress: Progressing toward goals Miscellaneous OT Goals Miscellaneous OT Goal #1: pt will perform washing hair sit to stand at sink with supervision OT Goal: Miscellaneous Goal #1 - Progress: Progressing toward goals Miscellaneous OT Goal #2: pt will verbalize 2 energy conservation techniques OT Goal: Miscellaneous Goal #2 - Progress: Progressing toward goals  OT Evaluation Precautions/Restrictions  Precautions Precautions: Fall Precaution Comments: O2 Restrictions Weight Bearing Restrictions: No Prior Functioning Home Living Lives With: Daughter Bathroom  Shower/Tub: Tub/shower unit Bathroom Toilet: Standard Home Adaptive Equipment: None;Other (comment) (tub transfer bench) Additional Comments: Pt wasI prior to adm; lives with dtr, her 3 kids  and 2 dogs; Prior Function Level of Independence: Independent with basic ADLs;Other (comment) (doesn't wear socks; rubs feet together to wash) ADL ADL Grooming: Simulated;Set up Where Assessed - Grooming: Sitting, chair;Supported Upper Body Bathing: Simulated;Set up Where Assessed - Upper Body Bathing: Supported;Sitting, chair Lower Body Bathing: Simulated;Minimal assistance Where Assessed - Lower Body Bathing: Sit to stand from chair Upper Body Dressing: Simulated;Minimal assistance;Other (comment) (IV lines) Where Assessed - Upper Body Dressing: Sitting, chair;Supported Lower Body Dressing: Simulated;Minimal assistance Where Assessed - Lower Body Dressing: Sit to stand from chair Toileting - Clothing Manipulation: Simulated;Supervision/safety Where Assessed - Toileting Clothing Manipulation: Sit to stand from 3-in-1 or toilet Toileting - Hygiene: Simulated;Supervision/safety Where Assessed - Toileting Hygiene: Sit to stand from 3-in-1 or toilet ADL Comments: pt able to stand with supervision but fatiques easily Vision/Perception  Vision - History Baseline Vision: No visual deficits Cognition Cognition Arousal/Alertness: Awake/alert Overall Cognitive Status: Appears within functional limits for tasks assessed Orientation Level: Oriented to place;Oriented to person Cognition - Other Comments: repeats statements and questions at times Sensation/Coordination   Extremity Assessment RUE Assessment RUE Assessment: Exceptions to [facility] RUE AROM (degrees) Right Shoulder Flexion  0-170: 90 Degrees LUE Assessment LUE Assessment: Exceptions to WFL LUE AROM (degrees) Left Shoulder Flexion  0-170: 80 Degrees Mobility  Bed Mobility Supine to Sit: 5: Supervision Supine to Sit Details (indicate cue  type and reason): verbal cues for safety; Transfers Sit to Stand: 5: Supervision Sit to Stand Details (indicate cue type and reason): verbal cues for hand placement Stand to Sit: 4: Min assist;To chair/3-in-1;With armrests Stand to Sit Details: verbal cues for hand placement and safe technique with RW Exercises   End of Session OT - End of Session Activity Tolerance: Patient limited by fatigue Patient left: in chair;with call bell in reach General Behavior During Session: [facility] for tasks performed Cognition: [facility] for tasks performed   Avinash Maltos [phone] 11/26/2010, 1:34 PM

## 2010-11-27 ENCOUNTER — Encounter: Payer: Medicare Other | Admitting: Endocrinology

## 2010-11-27 ENCOUNTER — Inpatient Hospital Stay: Admission: AD | Admit: 2010-11-27 | Payer: Self-pay | Source: Ambulatory Visit | Admitting: Internal Medicine

## 2010-11-27 ENCOUNTER — Ambulatory Visit: Payer: Medicare Other | Admitting: Internal Medicine

## 2010-11-27 LAB — GLUCOSE, CAPILLARY

## 2010-11-27 LAB — BASIC METABOLIC PANEL
CO2: 44 mEq/L (ref 19–32)
Chloride: 90 mEq/L — ABNORMAL LOW (ref 96–112)
Creatinine, Ser: 2.01 mg/dL — ABNORMAL HIGH (ref 0.50–1.10)
GFR calc Af Amer: 29 mL/min — ABNORMAL LOW (ref >90)
Potassium: 4.8 mEq/L (ref 3.5–5.1)
Sodium: 138 mEq/L (ref 135–145)

## 2010-11-27 LAB — CBC
MCV: 87.9 fL (ref 78.0–100.0)
Platelets: 206 10*3/uL (ref 150–400)
RBC: 3.48 MIL/uL — ABNORMAL LOW (ref 3.87–5.11)
RDW: 15.3 % (ref 11.5–15.5)
WBC: 5.9 10*3/uL (ref 4.0–10.5)

## 2010-11-27 MED ORDER — ALPRAZOLAM 0.5 MG PO TABS: 0.5000 mg | ORAL_TABLET | Freq: Three times a day (TID) | ORAL | Status: DC | PRN

## 2010-11-27 MED ORDER — INSULIN ASPART 100 UNIT/ML ~~LOC~~ SOLN: 0.0000 [IU] | Freq: Every day | SUBCUTANEOUS | Status: DC

## 2010-11-27 MED ORDER — INSULIN ASPART 100 UNIT/ML ~~LOC~~ SOLN: 2.0000 [IU] | Freq: Three times a day (TID) | SUBCUTANEOUS | Status: DC

## 2010-11-27 MED ORDER — ALBUTEROL SULFATE (5 MG/ML) 0.5% IN NEBU: 2.5000 mg | INHALATION_SOLUTION | RESPIRATORY_TRACT | Status: DC | PRN

## 2010-11-27 MED ORDER — INSULIN ASPART 100 UNIT/ML ~~LOC~~ SOLN: 0.0000 [IU] | Freq: Three times a day (TID) | SUBCUTANEOUS | Status: DC

## 2010-11-27 MED ORDER — INSULIN ASPART PROT & ASPART (70-30 MIX) 100 UNIT/ML ~~LOC~~ SUSP: 30.0000 [IU] | Freq: Every day | SUBCUTANEOUS | Status: DC

## 2010-11-27 NOTE — Progress Notes (Signed)
CSW received word from [name] that Pt has been approved to go to [facility] and Pt agrees to that plan. CSW will be available if plan changes back to SNF.  [name] 11/27/2010 9:00 AM [phone]

## 2010-11-27 NOTE — Progress Notes (Signed)
CRITICAL VALUE ALERT  Critical value received:  co2 44  Date of notification:  11/27/2010   Time of notification:  0530  Critical value read back:yes  Nurse who received alert:  Jilda Panda   MD notified (1st page):  [email or web address]  Time of first page:  0535  MD notified (2nd page):  Time of second page:  Responding MD:  [email or web address]  Time MD responded:  [phone]

## 2010-11-27 NOTE — Discharge Summary (Signed)
NAMEDELLANIRA, DILLOW                 ACCOUNT NO.:  [id number]  MEDICAL RECORD NO.:  [id number]  LOCATION:  1340                         FACILITY:  [facility]  PHYSICIAN:  Leslye Peer, MD    DATE OF BIRTH:  12-Nov-1945  DATE OF ADMISSION:  11/20/2010 DATE OF DISCHARGE:                              DISCHARGE SUMMARY   She was admitted to [facility] with acute exacerbation of COPD.  DISCHARGE DIAGNOSES: 1. Acute respiratory failure with a component of congestive heart     failure, with acute exacerbation of chronic obstructive pulmonary     disease. 2. Diabetes mellitus type 2. 3. Anxiety. 4. Chronic respiratory failure.  HISTORY OF PRESENT ILLNESS:  Ms. Longo is a 65 year old white female, who has known chronic obstructive pulmonary disease.  She presented in acute respiratory distress and required noninvasive mechanical ventilatory support.  She tolerated BiPAP and was successful __________ on November 21, 2010.  She continued to improve, was transferred to the floor on November 25, 2010.  Diabetes mellitus.  She has been on a sliding scale insulin this admission.  At home, she takes 60 units of 70/30 b.i.d. at home.  This had been decreased most likely due to the fact she is compliant with diet at this time.  LABORATORY DATA:  Her microbiology data, urine culture was negative. MRSA by PCR was negative.  Chest x-ray demonstrates no change in mild pulmonary vascular congestion, mild basilar volume loss.  Cardiomegaly is stable.  No bony abnormality is seen.  She had no consults.  PHYSICAL EXAMINATION:  VITAL SIGNS:  Temperature is 97.7, pulse rate is 88, respiratory rate is 16, and blood pressure was 138/87.  She reached maximal hospital benefit by November 27, 2010, and was ready to transfer to [facility].  DISCHARGE MEDICATIONS:  Will be listed in her med reconciliation sheet on the discharge summary.  DIET:  Heart healthy, low carbohydrate diet.  She  remains on 4 L nasal cannula with adequate saturations.  She is being discharged in improved condition.     Devra Dopp, MSN, ACNP   ______________________________ Leslye Peer, MD    SM/MEDQ  D:  11/27/2010  T:  11/27/2010  Job:  [id number]

## 2010-11-27 NOTE — Discharge Summary (Signed)
I agree with the data and plans above. Pt discharged to [facility].  [name] Ariola S. 2:37 PM

## 2010-11-27 NOTE — Discharge Summary (Signed)
Physician Discharge Summary  Patient ID: [name] MRN: [id number] DOB/AGE: Dec 02, 1945 65 y.o.  Admit date: 11/20/2010 Discharge date: 11/27/2010  Admission Diagnoses:  Discharge Diagnoses:  Principal Problem:  *Acute respiratory failure Active Problems:  DIABETES MELLITUS, TYPE II  ANXIETY  RESPIRATORY FAILURE, CHRONIC   Lines, Airways, Drains: Urethral Catheter Temperature probe 14 Fr. (Active)  Site Assessment Clean;Intact 11/26/2010  8:00 AM  Collection Container Standard drainage bag 11/27/2010  9:00 AM  Securement Method Leg strap 11/27/2010  9:00 AM  Indication for Insertion or Continuance of Catheter Urinary output monitoring 11/27/2010  9:00 AM  Input (mL) 4 mL 11/23/2010  6:08 PM    Microbiology/Sepsis markers: Results for orders placed during the hospital encounter of 11/20/10  URINE CULTURE     Status: Normal   Collection Time   11/21/10 12:48 AM      Component Value Range Status Comment   Specimen Description URINE, CATHETERIZED   Final    Special Requests Normal   Final    Setup Time [id number]   Final    Colony Count NO GROWTH   Final    Culture NO GROWTH   Final    Report Status 11/22/2010 FINAL   Final   MRSA PCR SCREENING     Status: Normal   Collection Time   11/21/10  7:11 AM      Component Value Range Status Comment   MRSA by PCR NEGATIVE  NEGATIVE  Final     Anti-infectives:  Anti-infectives     Start     Dose/Rate Route Frequency Ordered Stop   11/22/10 0826   levofloxacin (LEVAQUIN) tablet 500 mg        500 mg Oral Daily 11/22/10 0834 11/26/10 0953   11/22/10 0600   Levofloxacin (LEVAQUIN) IVPB 750 mg  Status:  Discontinued        750 mg 100 mL/hr over 90 Minutes Intravenous Every 24 hours 11/21/10 0501 11/22/10 0834   11/21/10 0255   Levofloxacin (LEVAQUIN) 750 MG/150ML IVPB     Comments: [name]: cabinet override         11/21/10 0255 11/21/10 1459   11/21/10 0215   Levofloxacin (LEVAQUIN) IVPB 750 mg  Status:   Discontinued        750 mg 100 mL/hr over 90 Minutes Intravenous Every 24 hours 11/21/10 0209 11/21/10 0502              Studies: Dg Chest Port 1 View  11/25/2010  *RADIOLOGY REPORT*  Clinical Data: COPD, follow-up  PORTABLE CHEST - 1 VIEW  Comparison: Portable chest x-ray of 11/24/2010  Findings: There is little change in mild pulmonary vascular congestion and mild basilar volume loss.  Cardiomegaly is stable. No bony abnormality is seen.  IMPRESSION: Little change in mild pulmonary vascular congestion.  Original Report Authenticated By: Juline Patch, M.D.   Dg Chest Portable 1 View  11/24/2010  *RADIOLOGY REPORT*  Clinical Data: COPD  PORTABLE CHEST - 1 VIEW  Comparison: 11/23/2010; 11/22/2010; 11/20/2010  Findings: Grossly unchanged enlarged cardiac silhouette and mediastinal contours.  Lung volumes remain reduced.  There is persistent cephalization of flow and bibasilar heterogeneous opacities.  Query small bilateral effusions.  No definite pneumothorax.  Unchanged bones.  IMPRESSION: Grossly unchanged findings of pulmonary edema and basilar opacities, likely atelectasis.  Original Report Authenticated By: Waynard Reeds, M.D.   Dg Chest Portable 1 View  11/23/2010  *RADIOLOGY REPORT*  Clinical Data: Follow up congestive heart failure.  PORTABLE  CHEST - 1 VIEW  Comparison: Chest radiograph performed 11/22/2010  Findings: The lungs are well-aerated.  Bibasilar airspace opacities are not significantly changed in appearance, with persistent underlying vascular congestion, reflecting persistent mild interstitial edema.  Small bilateral pleural effusions are likely present.  No pneumothorax is seen.  The cardiomediastinal silhouette remains mildly enlarged.  No acute osseous abnormalities are seen.  IMPRESSION: No significant interval change from the prior study.  Stable bibasilar airspace opacities, with persistent underlying vascular congestion and mild cardiomegaly, likely reflecting  persistent mild interstitial edema.  Small bilateral pleural effusions noted.  Original Report Authenticated By: Tonia Ghent, M.D.   Dg Chest Portable 1 View  11/22/2010  *RADIOLOGY REPORT*  Clinical Data: CHF  PORTABLE CHEST - 1 VIEW  Comparison: 11/20/2010; 09/29/2010; chest CT - 09/29/2010  Findings:  Grossly unchanged enlarged cardiac silhouette and mediastinal contours.  The pulmonary vasculature remains indistinct.  Increased conspicuity of the pulmonary interstitium with suggestion of a nodular pattern.  No change to minimal increase bibasilar opacities, possibly atelectasis.  Persistent blunting of bilateral costophrenic angles may suggest small effusions.  No definite pneumothorax.  Unchanged bones.  IMPRESSION:  Increased conspicuity pulmonary interstitium with slight nodular pattern may represent pulmonary edema, though an atypical infectious process may have a similar appearance.  Clinical correlation is advised.  Original Report Authenticated By: Waynard Reeds, M.D.   Dg Chest Portable 1 View  11/20/2010  *RADIOLOGY REPORT*  Clinical Data: Shortness of breath.  PORTABLE CHEST - 1 VIEW  Comparison: Chest x-ray 09/29/2010.  Findings: The heart is mildly enlarged.  There is vascular congestion and interstitial pulmonary edema along with a small right pleural effusion consistent with CHF.  IMPRESSION: CHF.  Original Report Authenticated By: P. Loralie Champagne, M.D.    Consults:     Hospital Course:  See dictation [phone] Principal Problem:  *Acute respiratory failure Active Problems:  DIABETES MELLITUS, TYPE II  ANXIETY  RESPIRATORY FAILURE, CHRONIC    ]    Labs at discharge Lab Results  Component Value Date   CREATININE 2.01* 11/27/2010   BUN 51* 11/27/2010   NA 138 11/27/2010   K 4.8 11/27/2010   CL 90* 11/27/2010   CO2 44* 11/27/2010   Lab Results  Component Value Date   WBC 5.9 11/27/2010   HGB 9.5* 11/27/2010   HCT 30.6* 11/27/2010   MCV 87.9 11/27/2010    PLT 206 11/27/2010   Lab Results  Component Value Date   ALT 25 11/20/2010   AST 29 11/20/2010   ALKPHOS 69 11/20/2010   BILITOT 0.4 11/20/2010   Lab Results  Component Value Date   INR 1.05 11/20/2010   INR 1.09 02/13/2009   INR 1.1 11/07/2007    Current radiology studies @[id number] @  Disposition:  Home-Health Care Svc  Discharge Orders    Future Orders Please Complete By Expires   Discharge to SNF when bed available      Discontinue IV        Current Discharge Medication List    START taking these medications   Details  albuterol (PROVENTIL) (5 MG/ML) 0.5% nebulizer solution Take 0.5 mLs (2.5 mg total) by nebulization every 3 (three) hours as needed for wheezing or shortness of breath. Qty: 20 mL, Refills: 1    !! insulin aspart (NOVOLOG) 100 UNIT/ML injection Inject 0-15 Units into the skin 3 (three) times daily with meals. Qty: 1 vial, Refills: 1    !! insulin aspart (NOVOLOG) 100 UNIT/ML injection Inject 2 Units  into the skin 3 (three) times daily with meals. Qty: 1 vial, Refills: 1    !! insulin aspart (NOVOLOG) 100 UNIT/ML injection Inject 0-5 Units into the skin at bedtime. Qty: 1 vial, Refills: 1    insulin aspart protamine-insulin aspart (NOVOLOG 70/30) (70-30) 100 UNIT/ML injection Inject 30 Units into the skin daily after breakfast. Qty: 10 mL, Refills: 1     !! - Potential duplicate medications found. Please discuss with provider.    CONTINUE these medications which have CHANGED   Details  ALPRAZolam (XANAX) 0.5 MG tablet Take 1 tablet (0.5 mg total) by mouth 3 (three) times daily as needed for anxiety. Qty: 30 tablet, Refills: 0      CONTINUE these medications which have NOT CHANGED   Details  aspirin 81 MG tablet Take 81 mg by mouth daily.     budesonide-formoterol (SYMBICORT) 160-4.5 MCG/ACT inhaler Inhale 2 puffs into the lungs 2 (two) times daily.      calcitRIOL (ROCALTROL) 0.25 MCG capsule Take 0.25 mcg by mouth daily.      citalopram  (CELEXA) 40 MG tablet Take 40 mg by mouth daily.      cloNIDine (CATAPRES) 0.1 MG tablet Take 0.1 mg by mouth 3 (three) times daily.     dextromethorphan-guaiFENesin (MUCINEX DM) 30-600 MG per 12 hr tablet Take by mouth every 12 (twelve) hours as needed. 1-2 tablets. For congestion.    famotidine (PEPCID) 20 MG tablet Take 20 mg by mouth daily as needed. For heartburn.    furosemide (LASIX) 40 MG tablet Take 40 mg by mouth daily.      OVER THE COUNTER MEDICATION Take 1 tablet by mouth daily. Iron tablet.    Multiple Vitamin (MULTIVITAMIN) tablet Take 1 tablet by mouth daily.     NON FORMULARY Oxygen 4LPM  24/7       STOP taking these medications     albuterol (PROAIR HFA) 108 (90 BASE) MCG/ACT inhaler      albuterol (PROVENTIL) (2.5 MG/3ML) 0.083% nebulizer solution      insulin NPH-insulin regular (NOVOLIN 70/30) (70-30) 100 UNIT/ML injection      Multiple Vitamins-Minerals (MULTIVITAMINS THER. W/MINERALS) TABS      tiotropium (SPIRIVA) 18 MCG inhalation capsule      triamcinolone (KENALOG) 0.025 % cream      acetaminophen (TYLENOL) 500 MG tablet      albuterol (PROAIR HFA) 108 (90 BASE) MCG/ACT inhaler      Ciprofloxacin (CIPRO PO)      Insulin Syringe-Needle U-100 (RELION INSULIN SYRINGE 1ML/31G) 31G X 5/16" 1 ML MISC      tiotropium (SPIRIVA HANDIHALER) 18 MCG inhalation capsule      triamcinolone (KENALOG) 0.025 % cream          Discharged Condition: good  Signed: MINOR,WILLIAM S 11/27/2010, 1:55 PM

## 2010-11-27 NOTE — Progress Notes (Signed)
Spoke to [name] at 1520 and he said to cancel the discharge order and to resume the previous orders.   There was not a specific order to discontinue for the discharge.

## 2010-11-28 DIAGNOSIS — I279 Pulmonary heart disease, unspecified: Secondary | ICD-10-CM

## 2010-11-28 DIAGNOSIS — I5021 Acute systolic (congestive) heart failure: Secondary | ICD-10-CM

## 2010-11-28 LAB — GLUCOSE, CAPILLARY
Glucose-Capillary: 132 mg/dL — ABNORMAL HIGH (ref 70–99)
Glucose-Capillary: 139 mg/dL — ABNORMAL HIGH (ref 70–99)

## 2010-11-28 MED ORDER — ACETAZOLAMIDE 250 MG PO TABS
500.0000 mg | ORAL_TABLET | Freq: Every day | ORAL | Status: DC
  Administered 2010-11-28 – 2010-12-01 (×4): 500 mg via ORAL
  Filled 2010-11-28 (×5): qty 2

## 2010-11-28 MED ORDER — FUROSEMIDE 40 MG PO TABS
40.0000 mg | ORAL_TABLET | Freq: Every day | ORAL | Status: DC
  Administered 2010-11-29 – 2010-12-01 (×3): 40 mg via ORAL
  Filled 2010-11-28 (×4): qty 1

## 2010-11-28 NOTE — Progress Notes (Signed)
Follow up - Critical Care Medicine Note  Patient Details:    [name] is an 65 y.o. female w/ severe COPD, recent ex-smoker, adm 11/10 w/ COPD exac & CHF  Lines, Airways, Drains: Urethral Catheter Temperature probe 14 Fr. (Active)  Site Assessment Clean;Intact 11/21/2010  6:51 AM  Collection Container Standard drainage bag 11/21/2010  6:51 AM  Securement Method Securing device (Describe) 11/21/2010  6:51 AM    Anti-infectives:  Anti-infectives     Start     Dose/Rate Route Frequency Ordered Stop   11/22/10 0826   levofloxacin (LEVAQUIN) tablet 500 mg        500 mg Oral Daily 11/22/10 0834 11/26/10 0953   11/22/10 0600   Levofloxacin (LEVAQUIN) IVPB 750 mg  Status:  Discontinued        750 mg 100 mL/hr over 90 Minutes Intravenous Every 24 hours 11/21/10 0501 11/22/10 0834   11/21/10 0255   Levofloxacin (LEVAQUIN) 750 MG/150ML IVPB     Comments: [name]: cabinet override         11/21/10 0255 11/21/10 1459   11/21/10 0215   Levofloxacin (LEVAQUIN) IVPB 750 mg  Status:  Discontinued        750 mg 100 mL/hr over 90 Minutes Intravenous Every 24 hours 11/21/10 6295 11/21/10 0502          Microbiology: Results for orders placed during the hospital encounter of 11/20/10  URINE CULTURE     Status: Normal   Collection Time   11/21/10 12:48 AM      Component Value Range Status Comment   Specimen Description URINE, CATHETERIZED   Final    Special Requests Normal   Final    Setup Time [id number]   Final    Colony Count NO GROWTH   Final    Culture NO GROWTH   Final    Report Status 11/22/2010 FINAL   Final   MRSA PCR SCREENING     Status: Normal   Collection Time   11/21/10  7:11 AM      Component Value Range Status Comment   MRSA by PCR NEGATIVE  NEGATIVE  Final     Best Practice/Protocols:  VTE Prophylaxis: Heparin (SQ) nonpreg adult SSI 11/10 >>  Events: 11/10: tolerating off BiPAP and [name] without distress 11/11: more dyspneic 11/12 eating 11/14  orders to tx to floor Studies: No results found.   Consults: none     Subjective:    Overnight Issues: off bipap, decreased wob  Objective:  Vital signs for last 24 hours: Temp:  [97.8 F (36.6 C)-98.5 F (36.9 C)] 98.5 F (36.9 C) (11/17 1406) Pulse Rate:  [86-98] 86  (11/17 1406) Resp:  [16-18] 18  (11/17 1406) BP: (105-151)/(55-74) 116/67 mmHg (11/17 1406) SpO2:  [92 %-97 %] 94 % (11/17 1406) Weight:  [115.9 kg (255 lb 8.2 oz)] 255 lb 8.2 oz (115.9 kg) (11/17 0500)    Intake/Output from previous day: 11/16 0701 - 11/17 0700 In: 480 [P.O.:480] Out: 2950 [Urine:2950]   Physical Exam:  General: alert and no respiratory distress Mildly dyspneic @ rest HEENT WNL JVP not visulaized Diminished BS throughout, no wheezes, bibasilar rales RRR s M Obese, soft, NT, + BS 1+ symmetric ankle edema  LABS: Hgb= 9.5 [place]:  CO2= 44, BUN= 50, Creat= 2.01, K= 4.8 CBGs:  Last 24H> 74 -164 BNP= 2932  CXR: Last film 11/14 showed>  There is little change in mild pulmonary vascular  congestion and mild basilar volume  loss. Cardiomegaly is stable.  2DEcho 11/11 showed>  Study Conclusions - Left ventricle: The images are severely limited. There is significant LV dysfunction, but I can not assess further. The cavity size was mildly dilated. - Right ventricle: The RV is poorly seen. There is a clue of significant RV dilitation and RV dysfunction.   MEDS REVIEWED: On NEBS w/ Albut & Ipratrop, SYMBICORT... On Clonidine, & LASIX 40mg IVQ12h... 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 - continue diuresis & change Lasix to po, add Diamox (HCO3=44)... - ?disposition> [facility], she will refuse NHP for rehab purposes so we may need to work on max home health  support....  CARDIAC> signif LVDysfunction on 2DEcho, mult cardiac risk factors &high risk for CAD> ?ischemic cardiomyopathy, may want to get Cards consult...   DIABETES MELLITUS, TYPE II> her primary is [name]? Hypoglycemia during night Cont CHO modified diet.  She takes 60 units 70/30 insulin BID @ home. Will decreaset to 30 units BID to avoid hypoglycemia Cont nonpreg adult SSI ordered   ANXIETY Takes Xanax @ home. Continue same...    LOS: 8 days    [name] M 11/28/2010 2:45 PM

## 2010-11-29 DIAGNOSIS — E662 Morbid (severe) obesity with alveolar hypoventilation: Secondary | ICD-10-CM

## 2010-11-29 DIAGNOSIS — I5021 Acute systolic (congestive) heart failure: Secondary | ICD-10-CM

## 2010-11-29 DIAGNOSIS — I279 Pulmonary heart disease, unspecified: Secondary | ICD-10-CM

## 2010-11-29 DIAGNOSIS — J449 Chronic obstructive pulmonary disease, unspecified: Secondary | ICD-10-CM

## 2010-11-29 LAB — GLUCOSE, CAPILLARY
Glucose-Capillary: 122 mg/dL — ABNORMAL HIGH (ref 70–99)
Glucose-Capillary: 127 mg/dL — ABNORMAL HIGH (ref 70–99)

## 2010-11-29 NOTE — Progress Notes (Signed)
See CSW's complete Ax note in shadow chart.   Briefly, Pt wanting to go to [facility] and questions why this placement isn't viable.  Spoke with RN who stated that Pt didn't meet criteria for [facility], as she is doing too well for this placement.  Pt initially not amenable to SNF; just wanting to d/c home with [facility].  Spoke with Pt regarding MD and PT/OT recommendations, as well as answered insurance questions and purpose of SNF.  Pt agreeable to [facility] list and CSW provided Pt with [facility] SNF rating system for SNF's in [place]    CSW weekday to f/u.  Providence Crosby, LCSWA Clinical Social Work [phone]

## 2010-11-29 NOTE — Progress Notes (Signed)
Completed FL2.  Attempted to upload it into [facility].  Unable to do so.  Attempted to print.  Unable to do so.  CSW weekday to f/u.  Providence Crosby, LCSWA Clinical Social Work [phone]

## 2010-11-29 NOTE — Progress Notes (Signed)
Follow up - Critical Care Medicine Note  Patient Details:    [name] is an 65 y.o. female w/ severe COPD, recent ex-smoker, adm 11/10 w/ COPD exac & CHF  Lines, Airways, Drains: Urethral Catheter Temperature probe 14 Fr. (Active)  Site Assessment Clean;Intact 11/21/2010  6:51 AM  Collection Container Standard drainage bag 11/21/2010  6:51 AM  Securement Method Securing device (Describe) 11/21/2010  6:51 AM    Anti-infectives:  Anti-infectives     Start     Dose/Rate Route Frequency Ordered Stop   11/22/10 0826   levofloxacin (LEVAQUIN) tablet 500 mg        500 mg Oral Daily 11/22/10 0834 11/26/10 0953   11/22/10 0600   Levofloxacin (LEVAQUIN) IVPB 750 mg  Status:  Discontinued        750 mg 100 mL/hr over 90 Minutes Intravenous Every 24 hours 11/21/10 0501 11/22/10 0834   11/21/10 0255   Levofloxacin (LEVAQUIN) 750 MG/150ML IVPB     Comments: [name]: cabinet override         11/21/10 0255 11/21/10 1459   11/21/10 0215   Levofloxacin (LEVAQUIN) IVPB 750 mg  Status:  Discontinued        750 mg 100 mL/hr over 90 Minutes Intravenous Every 24 hours 11/21/10 1610 11/21/10 0502          Microbiology: Results for orders placed during the hospital encounter of 11/20/10  URINE CULTURE     Status: Normal   Collection Time   11/21/10 12:48 AM      Component Value Range Status Comment   Specimen Description URINE, CATHETERIZED   Final    Special Requests Normal   Final    Setup Time [id number]   Final    Colony Count NO GROWTH   Final    Culture NO GROWTH   Final    Report Status 11/22/2010 FINAL   Final   MRSA PCR SCREENING     Status: Normal   Collection Time   11/21/10  7:11 AM      Component Value Range Status Comment   MRSA by PCR NEGATIVE  NEGATIVE  Final     Best Practice/Protocols:  VTE Prophylaxis: Heparin (SQ) nonpreg adult SSI 11/10 >>  Events: 11/10: tolerating off BiPAP and [name] without distress 11/11: more dyspneic 11/12 eating 11/14  orders to tx to floor  Consults: none     Subjective:    She is now agreeable to SNF placement in [place] for rehab purposes & SS is working on a bed...  Objective:  Vital signs for last 24 hours: Temp:  [98 F (36.7 C)-98.5 F (36.9 C)] 98 F (36.7 C) (11/17 2133) Pulse Rate:  [86-90] 90  (11/17 2133) Resp:  [16-18] 16  (11/17 2133) BP: (116-134)/(66-67) 134/66 mmHg (11/17 2133) SpO2:  [91 %-96 %] 94 % (11/18 0900)   Intake/Output from previous day: 11/17 0701 - 11/18 0700 In: 240 [P.O.:240] Out: 1900 [Urine:1900]   Physical Exam:  General: alert and no respiratory distress Mildly dyspneic @ rest HEENT WNL JVP not visulaized Diminished BS throughout, no wheezes, bibasilar rales RRR s M Obese, soft, NT, + BS 1+ symmetric ankle edema  LABS: 11/12:  BNP= 6939 11/15:  BNP= 2932 11/16: Hgb= 9.5, CO2= 44, BUN= 50, Creat= 2.01, K= 4.8 CBGs:  Last 24H> 74 - 139 & FBS today= 122  CXR: Last film 11/14 showed>  There is little change in mild pulmonary vascular  congestion and mild  basilar volume loss. Cardiomegaly is stable.  2DEcho 11/11 showed>  Study Conclusions - Left ventricle: The images are severely limited. There is significant LV dysfunction, but I can not assess further. The cavity size was mildly dilated. - Right ventricle: The RV is poorly seen. There is a clue of significant RV dilitation and RV dysfunction.   MEDS REVIEWED: On NEBS w/ Albut & Ipratrop, SYMBICORT... On Clonidine, & LASIX 40mg IVQ12h... 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 - continue diuresis & change Lasix to po, add Diamox (HCO3=44) & f/u labs. - ?disposition> [facility] declined [facility], she may refuse NHP for rehab purposes so we may need to work on max home health  support....  CARDIAC> signif LVDysfunction on 2DEcho, mult cardiac risk factors &high risk for CAD> ?ischemic cardiomyopathy, may want to get Cards consult...   DIABETES MELLITUS, TYPE II> her primary is [name]? Cont CHO modified diet.  She takes 60 units 70/30 insulin BID @ home. Will decreaset to 30 units BID to avoid hypoglycemia Cont nonpreg adult SSI ordered   ANXIETY Takes Xanax @ home. Continue same...    LOS: 9 days    Keon Benscoter M 11/29/2010 1:12 PM

## 2010-11-30 LAB — COMPREHENSIVE METABOLIC PANEL
ALT: 13 U/L (ref 0–35)
Albumin: 3.2 g/dL — ABNORMAL LOW (ref 3.5–5.2)
Alkaline Phosphatase: 49 U/L (ref 39–117)
Potassium: 4.6 mEq/L (ref 3.5–5.1)
Sodium: 137 mEq/L (ref 135–145)
Total Protein: 7 g/dL (ref 6.0–8.3)

## 2010-11-30 LAB — GLUCOSE, CAPILLARY
Glucose-Capillary: 145 mg/dL — ABNORMAL HIGH (ref 70–99)
Glucose-Capillary: 156 mg/dL — ABNORMAL HIGH (ref 70–99)

## 2010-11-30 LAB — CBC
MCHC: 29.6 g/dL — ABNORMAL LOW (ref 30.0–36.0)
Platelets: 216 10*3/uL (ref 150–400)
RDW: 15 % (ref 11.5–15.5)

## 2010-11-30 MED ORDER — HYDROMORPHONE HCL PF 1 MG/ML IJ SOLN
INTRAMUSCULAR | Status: AC
  Filled 2010-11-30: qty 1

## 2010-11-30 NOTE — Progress Notes (Signed)
Follow up - Critical Care Medicine Note  Patient Details:    [name] is a 65 y.o. female w/ severe COPD, recent ex-smoker, adm 11/10 w/ COPD exac & CHF        Microbiology: Results for orders placed during the hospital encounter of 11/20/10  URINE CULTURE     Status: Normal   Collection Time   11/21/10 12:48 AM      Component Value Range Status Comment   Specimen Description URINE, CATHETERIZED   Final    Special Requests Normal   Final    Setup Time [id number]   Final    Colony Count NO GROWTH   Final    Culture NO GROWTH   Final    Report Status 11/22/2010 FINAL   Final   MRSA PCR SCREENING     Status: Normal   Collection Time   11/21/10  7:11 AM      Component Value Range Status Comment   MRSA by PCR NEGATIVE  NEGATIVE  Final     Best Practice/Protocols:  VTE Prophylaxis: Heparin (SQ) nonpreg adult SSI 11/10 >>      Consults: none     Subjective:    She is now agreeable to SNF placement in [place] for rehab purposes & SS is working on a bed Sitting in chair nad on 02  Objective:  Vital signs for last 24 hours: Temp:  [98 F (36.7 C)-98.5 F (36.9 C)] 98 F (36.7 C) (11/17 2133) Pulse Rate:  [86-90] 90  (11/17 2133) Resp:  [16-18] 16  (11/17 2133) BP: (116-134)/(66-67) 134/66 mmHg (11/17 2133) SpO2:  [91 %-96 %] 94 % (11/18 0900)   Intake/Output from previous day: 11/17 0701 - 11/18 0700 In: 240 [P.O.:240] Out: 1900 [Urine:1900]   Physical Exam:  General: alert and no respiratory distress Mildly dyspneic @ rest HEENT WNL JVP not visulaized Diminished BS throughout, no wheezes, bibasilar rales RRR s M Obese, soft, NT, + BS Trace  ankle edema  LABS: 11/12:  BNP= 6939 11/15:  BNP= 2932 11/16: Hgb= 9.5, CO2= 44, BUN= 50, Creat= 2.01, K= 4.8 CBGs:  Last 24H> 74 - 139 & FBS today= 122    2DEcho 11/11 showed>  Study Conclusions - Left ventricle: The images are severely limited. There is significant LV dysfunction, but I can not  assess further. The cavity size was mildly dilated. - Right ventricle: The RV is poorly seen. There is a clue of significant RV dilitation and RV dysfunction.       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 - continue diuresis   - ?disposition> [facility], she may refuse NHP for rehab purposes so we may need to work on max home health support....  CARDIAC> signif LVDysfunction on 2DEcho, mult cardiac risk factors &high risk for CAD > no a candidate for cards w/u   DIABETES MELLITUS, TYPE II> her primary is [name]  Cont CHO modified diet.  She takes 60 units 70/30 insulin BID @ home. Will decreaset to 30 units BID to avoid hypoglycemia Cont nonpreg adult SSI ordered   ANXIETY Takes Xanax @ home. Continue same...      Sandrea Hughs, MD Pulmonary and Critical Care Medicine [facility] Cell [phone]

## 2010-11-30 NOTE — Progress Notes (Signed)
CSW following for d/c planning. Pt was denied at [facility] over weekend. Pt worked up for SNF and faxed out today to begin bed search process. Pt has a few bed offers, but Pt could change her mind again about her plans. Pt was originally wanting home with home health, then the plan changed to [facility].  Pt stated she really wants to go home with home health. PT currently working with Pt and feels she needs SNF. CSW met with Pt again who is now agreeable to SNF when medically ready. PLEASE HAVE MD SIGN FL2 in [place]. Vennie Homans 11/30/2010 11:53 AM [phone]

## 2010-11-30 NOTE — Progress Notes (Signed)
Physical Therapy Treatment Patient Details Name: JESIKA MEN MRN: [id number] DOB: 20-Feb-1945 Today's Date: 11/30/2010  PT Assessment/Plan  PT - Assessment/Plan PT Frequency: Min 3X/week Follow Up Recommendations: Skilled nursing facility ( optimal functional independence from short term SNF) PT Goals  Acute Rehab PT Goals PT Goal: Supine/Side to Sit - Progress: Not met PT Goal: Sit to Supine/Side - Progress: Not met PT Transfer Goal: Sit to Stand/Stand to Sit - Progress: Not met PT Goal: Ambulate - Progress: Not met  PT Treatment Precautions/Restrictions  Precautions Precautions: Fall Precaution Comments: O2 Restrictions Weight Bearing Restrictions: No Mobility (including Balance) Bed Mobility Supine to Sit: 4: Min assist Supine to Sit Details (indicate cue type and reason): pt had difficulty rolling in hospital bed Transfers Sit to Stand: 5: Supervision;4: Min assist Sit to Stand Details (indicate cue type and reason): needs cues for hand placement Stand to Sit: 4: Min assist Ambulation/Gait Ambulation/Gait Assistance: 4: Min assist Ambulation/Gait Assistance Details (indicate cue type and reason): pt took 2 standing breaks.  limited by dyspnea with activity despite nasal O2 Ambulation Distance (Feet): 40 Feet Assistive device: Rolling walker  Balance Balance Assessed:  (pt needs bilateral support of U/Es on walkder for support) Exercise  General Exercises - Lower Extremity Ankle Circles/Pumps: 10 reps;Supine;AROM;Both Quad Sets: AROM;Both;Other reps (comment);Supine Gluteal Sets: AROM;Strengthening;Both;Standing End of Session PT - End of Session Equipment Utilized During Treatment: Gait belt (RW. O2 tank) Activity Tolerance: Treatment limited secondary to medical complications (Comment) (dyspnea on exertion, needs encouragement) Patient left: in chair;with call bell in reach;Other (comment) (with O2 in place) General Behavior During Session: [facility] for tasks  performed Cognition: [facility] for tasks performed  Donnetta Hail 11/30/2010, 1:40 PM

## 2010-12-01 DIAGNOSIS — E662 Morbid (severe) obesity with alveolar hypoventilation: Secondary | ICD-10-CM

## 2010-12-01 DIAGNOSIS — J449 Chronic obstructive pulmonary disease, unspecified: Secondary | ICD-10-CM

## 2010-12-01 DIAGNOSIS — I279 Pulmonary heart disease, unspecified: Secondary | ICD-10-CM

## 2010-12-01 DIAGNOSIS — I5021 Acute systolic (congestive) heart failure: Secondary | ICD-10-CM

## 2010-12-01 LAB — GLUCOSE, CAPILLARY
Glucose-Capillary: 129 mg/dL — ABNORMAL HIGH (ref 70–99)
Glucose-Capillary: 161 mg/dL — ABNORMAL HIGH (ref 70–99)

## 2010-12-01 MED ORDER — ACETAZOLAMIDE 250 MG PO TABS: 500.0000 mg | ORAL_TABLET | Freq: Every day | ORAL | Status: DC

## 2010-12-01 MED ORDER — INSULIN ASPART PROT & ASPART (70-30 MIX) 100 UNIT/ML ~~LOC~~ SUSP: 35.0000 [IU] | Freq: Two times a day (BID) | SUBCUTANEOUS | Status: DC

## 2010-12-01 MED ORDER — ALPRAZOLAM 0.5 MG PO TABS: 0.5000 mg | ORAL_TABLET | Freq: Three times a day (TID) | ORAL | Status: AC | PRN

## 2010-12-01 NOTE — Progress Notes (Signed)
Pt has bed at [facility] for today. Rep from facility coming to do paperwork with Pt this am. Pt to go by ambulance to facility at time of discharge. MD MUST SIGN FL2 prior to d/c! CSW will assist with chart copying and other paperwork, but Pt MUST have FL2 signed or cannot go! Vennie Homans, [place] 12/01/2010 9:55 AM [phone]

## 2010-12-01 NOTE — Progress Notes (Addendum)
Pts FL-2 was signed and CSW faxed d/c clinicals to Selena Batten at [facility]. CSW spoke with Montez Morita from [facility] and he reports the pt can come to the facility at any time, she has already completed the admission paperwork. CSW will arrange PTAR transportation for 17:30 and pts nurse Maura informed of time arranged for pt to transport. CSW has complied discharge clinicals to transport with the pt. CSW signing off.  Patrice Paradise, LCSWA 12/01/2010. 4:41 PM [phone]

## 2010-12-01 NOTE — Discharge Summary (Signed)
Physician Discharge Summary  Patient ID: [name] MRN: [id number] DOB/AGE: [birth year] 65 y.o.  Admit date: 11/20/2010 Discharge date: 12/01/2010  Admission Diagnoses: Acute on chronic respiratory failure.   Discharge Diagnoses:  Principal Problem:  *Acute respiratory failure Active Problems:  DIABETES MELLITUS, TYPE II  ANXIETY  RESPIRATORY FAILURE, CHRONIC   Hospital Course:   1) Acute  on Chronic  Resp Failure, in setting of decompensated Cor Pulmonale and underlying COPD: admitted to regular medical bed. Therapeutic interventions included supplemental O2. Scheduled bronchodilators and diuretics. She made slow progression with these interventions but remains weak and will require skilled nursing to maintain her health.  Recommendations:  - continue O2, NEBS, and diuretics - continue diuresis  - follow up with pulmonary PRN  CARDIAC> signif LVDysfunction on 2D Echo, mult cardiac risk factors &high risk for CAD > no a candidate for cards w/u  Recommendation: -continue medications as outlined.   DIABETES MELLITUS, TYPE II> her primary is [name]  Cont CHO modified diet.  -bid 70/30.   ANXIETY  Takes Xanax @ home. Continue same...   Discharge Exam: ]  Temp:  [98 F (36.7 C)-98.4 F (36.9 C)] 98.4 F (36.9 C) (11/20 1340) Pulse Rate:  [78-89] 84  (11/20 1340) Resp:  [18-20] 18  (11/20 1340) BP: (123-131)/(53-72) 129/66 mmHg (11/20 1340) SpO2:  [89 %-99 %] 95 % (11/20 1340) Weight:  [114.8 kg (253 lb 1.4 oz)] 253 lb 1.4 oz (114.8 kg) (11/20 0500)  Physical exam:  General: alert and no respiratory distress  Mildly dyspneic @ rest  HEENT WNL  JVP not visulaized  Diminished BS throughout, no wheezes, bibasilar rales  RRR s M  Obese, soft, NT, + BS  Trace ankle edema  Labs at discharge Lab Results  Component Value Date   CREATININE 1.91* 11/30/2010   BUN 58* 11/30/2010   NA 137 11/30/2010   K 4.6 11/30/2010   CL 95* 11/30/2010   CO2 36* 11/30/2010    Lab Results  Component Value Date   WBC 6.6 11/30/2010   HGB 9.5* 11/30/2010   HCT 32.1* 11/30/2010   MCV 89.2 11/30/2010   PLT 216 11/30/2010   Lab Results  Component Value Date   ALT 13 11/30/2010   AST 17 11/30/2010   ALKPHOS 49 11/30/2010   BILITOT 0.2* 11/30/2010   Lab Results  Component Value Date   INR 1.05 11/20/2010   INR 1.09 02/13/2009   INR 1.1 11/07/2007    Current radiology studies  11/24/10 pcxr Comparison: Portable chest x-ray of 11/24/2010  Findings: There is little change in mild pulmonary vascular  congestion and mild basilar volume loss. Cardiomegaly is stable.  No bony abnormality is seen.  IMPRESSION:  Little change in mild pulmonary vascular congestion.   Disposition:  Home-Health Care Svc  Discharge Orders    Future Orders Please Complete By Expires   Diet - low sodium heart healthy      Discharge to SNF when bed available      Increase activity slowly      Discontinue IV        Current Discharge Medication List    START taking these medications   Details  acetaZOLAMIDE (DIAMOX) 250 MG tablet Take 2 tablets (500 mg total) by mouth daily at 3 pm. Qty: 30 tablet, Refills: 0    albuterol (PROVENTIL) (5 MG/ML) 0.5% nebulizer solution Take 0.5 mLs (2.5 mg total) by nebulization every 3 (three) hours as needed for wheezing or shortness of breath. Qty:  20 mL, Refills: 1    insulin aspart protamine-insulin aspart (NOVOLOG 70/30) (70-30) 100 UNIT/ML injection Inject 35 Units into the skin 2 (two) times daily with a meal. Qty: 10 mL, Refills: 1      CONTINUE these medications which have CHANGED   Details  ALPRAZolam (XANAX) 0.5 MG tablet Take 1 tablet (0.5 mg total) by mouth 3 (three) times daily as needed for anxiety (for anxiety). Qty: 30 tablet, Refills: 0      CONTINUE these medications which have NOT CHANGED   Details  aspirin 81 MG tablet Take 81 mg by mouth daily.     budesonide-formoterol (SYMBICORT) 160-4.5 MCG/ACT inhaler  Inhale 2 puffs into the lungs 2 (two) times daily.      calcitRIOL (ROCALTROL) 0.25 MCG capsule Take 0.25 mcg by mouth daily.      citalopram (CELEXA) 40 MG tablet Take 40 mg by mouth daily.      cloNIDine (CATAPRES) 0.1 MG tablet Take 0.1 mg by mouth 3 (three) times daily.     dextromethorphan-guaiFENesin (MUCINEX DM) 30-600 MG per 12 hr tablet Take by mouth every 12 (twelve) hours as needed. 1-2 tablets. For congestion.    famotidine (PEPCID) 20 MG tablet Take 20 mg by mouth daily as needed. For heartburn.    furosemide (LASIX) 40 MG tablet Take 40 mg by mouth daily.      OVER THE COUNTER MEDICATION Take 1 tablet by mouth daily. Iron tablet.    Multiple Vitamin (MULTIVITAMIN) tablet Take 1 tablet by mouth daily.     NON FORMULARY Oxygen 4LPM  24/7       STOP taking these medications     albuterol (PROAIR HFA) 108 (90 BASE) MCG/ACT inhaler      albuterol (PROVENTIL) (2.5 MG/3ML) 0.083% nebulizer solution      insulin NPH-insulin regular (NOVOLIN 70/30) (70-30) 100 UNIT/ML injection      Multiple Vitamins-Minerals (MULTIVITAMINS THER. W/MINERALS) TABS      tiotropium (SPIRIVA) 18 MCG inhalation capsule      triamcinolone (KENALOG) 0.025 % cream      acetaminophen (TYLENOL) 500 MG tablet      albuterol (PROAIR HFA) 108 (90 BASE) MCG/ACT inhaler      Ciprofloxacin (CIPRO PO)      Insulin Syringe-Needle U-100 (RELION INSULIN SYRINGE 1ML/31G) 31G X 5/16" 1 ML MISC      tiotropium (SPIRIVA HANDIHALER) 18 MCG inhalation capsule      triamcinolone (KENALOG) 0.025 % cream          Discharged Condition: fair  Signed: Sandrea Hughs, MD Pulmonary and Critical Care Medicine [facility] Cell [phone]

## 2010-12-01 NOTE — Progress Notes (Addendum)
Occupational Therapy Treatment Patient Details Name: [name] MRN: [id number] DOB: 1945-07-06 Today's Date: 12/01/2010 Time in: 9:40 am Time out: 10:12 am 2SC  OT Assessment/Plan OT Assessment/Plan Comments on Treatment Session: Patient needed min encouragement to participate but pleasant and interactive once session started. Patient currently limited by fatigue and decreased O2 sats. OT Plan: Discharge plan remains appropriate OT Frequency: Min 1X/week Follow Up Recommendations: Skilled nursing facility Equipment Recommended: Defer to next venue OT Goals ADL Goals ADL Goal: Grooming - Progress: Progressing toward goals ADL Goal: Lower Body Bathing - Progress: Not addressed ADL Goal: Lower Body Dressing - Progress: Not addressed ADL Goal: Toilet Transfer - Progress: Not addressed ADL Goal: Toileting - Clothing Manipulation - Progress: Not addressed ADL Goal: Toileting - Hygiene - Progress: Not addressed Miscellaneous OT Goals OT Goal: Miscellaneous Goal #1 - Progress: Not addressed OT Goal: Miscellaneous Goal #2 - Progress: Not addressed  OT Treatment Precautions/Restrictions  Precautions Precautions: Fall Precaution Comments: O2   ADL ADL Grooming: Minimal assistance;Teeth care Grooming Details (indicate cue type and reason): min guard assist. Only able tolerate one task today at the sink secondary to shortness of breath Where Assessed - Grooming: Standing at sink Mobility  Transfers Sit to Stand: 4: Min assist Sit to Stand Details (indicate cue type and reason): min guard assist and min verbal cues for hand placement Exercises    End of Session OT - End of Session Equipment Utilized During Treatment:  (rolling walker) Activity Tolerance: Patient limited by fatigue Patient left: in bed;with call bell in reach (patient wanted to be at EOB.) General Behavior During Session: [facility] for tasks performed Cognition: [facility] for tasks performed  Lennox Laity    Pager [phone] 12/01/2010, 11:34 AM

## 2010-12-02 NOTE — Discharge Summary (Signed)
Attending Addendum:  I have seen the patient, discussed the issues, test results and plans with S. Minor. I agree with the Assessment and Plan as outlined above.  Vibha Ferdig S.

## 2010-12-08 ENCOUNTER — Encounter: Payer: Medicare Other | Admitting: Endocrinology

## 2011-01-14 ENCOUNTER — Other Ambulatory Visit: Payer: Self-pay | Admitting: Endocrinology

## 2011-02-16 ENCOUNTER — Other Ambulatory Visit: Payer: Self-pay | Admitting: Internal Medicine

## 2011-02-16 ENCOUNTER — Other Ambulatory Visit: Payer: Self-pay | Admitting: Endocrinology

## 2011-02-16 NOTE — Telephone Encounter (Signed)
Rx faxed to pharmacy  

## 2011-03-04 ENCOUNTER — Ambulatory Visit: Payer: Medicare Other | Admitting: Endocrinology

## 2011-03-10 ENCOUNTER — Ambulatory Visit: Payer: Medicare Other | Admitting: Internal Medicine

## 2011-03-10 ENCOUNTER — Ambulatory Visit: Payer: Medicare Other | Admitting: Endocrinology

## 2011-03-18 ENCOUNTER — Other Ambulatory Visit: Payer: Self-pay | Admitting: Internal Medicine

## 2011-03-18 ENCOUNTER — Other Ambulatory Visit: Payer: Self-pay | Admitting: Endocrinology

## 2011-03-23 ENCOUNTER — Other Ambulatory Visit: Payer: Self-pay

## 2011-03-23 ENCOUNTER — Encounter (HOSPITAL_COMMUNITY): Payer: Self-pay | Admitting: Emergency Medicine

## 2011-03-23 ENCOUNTER — Inpatient Hospital Stay (HOSPITAL_COMMUNITY)
Admission: EM | Admit: 2011-03-23 | Discharge: 2011-04-01 | DRG: 291 | Disposition: A | Discharge status: Home Health | Payer: Medicare Other | Attending: Internal Medicine | Admitting: Internal Medicine

## 2011-03-23 ENCOUNTER — Emergency Department (HOSPITAL_COMMUNITY): Payer: Medicare Other

## 2011-03-23 DIAGNOSIS — D72829 Elevated white blood cell count, unspecified: Secondary | ICD-10-CM

## 2011-03-23 DIAGNOSIS — E7800 Pure hypercholesterolemia, unspecified: Secondary | ICD-10-CM

## 2011-03-23 DIAGNOSIS — G2581 Restless legs syndrome: Secondary | ICD-10-CM | POA: Diagnosis present

## 2011-03-23 DIAGNOSIS — F411 Generalized anxiety disorder: Secondary | ICD-10-CM

## 2011-03-23 DIAGNOSIS — R911 Solitary pulmonary nodule: Secondary | ICD-10-CM | POA: Diagnosis present

## 2011-03-23 DIAGNOSIS — IMO0002 Reserved for concepts with insufficient information to code with codable children: Secondary | ICD-10-CM

## 2011-03-23 DIAGNOSIS — N19 Unspecified kidney failure: Secondary | ICD-10-CM

## 2011-03-23 DIAGNOSIS — R9439 Abnormal result of other cardiovascular function study: Secondary | ICD-10-CM

## 2011-03-23 DIAGNOSIS — R300 Dysuria: Secondary | ICD-10-CM

## 2011-03-23 DIAGNOSIS — N058 Unspecified nephritic syndrome with other morphologic changes: Secondary | ICD-10-CM | POA: Diagnosis present

## 2011-03-23 DIAGNOSIS — Z780 Asymptomatic menopausal state: Secondary | ICD-10-CM

## 2011-03-23 DIAGNOSIS — I2589 Other forms of chronic ischemic heart disease: Secondary | ICD-10-CM | POA: Diagnosis present

## 2011-03-23 DIAGNOSIS — R930 Abnormal findings on diagnostic imaging of skull and head, not elsewhere classified: Secondary | ICD-10-CM

## 2011-03-23 DIAGNOSIS — Z9981 Dependence on supplemental oxygen: Secondary | ICD-10-CM

## 2011-03-23 DIAGNOSIS — R609 Edema, unspecified: Secondary | ICD-10-CM

## 2011-03-23 DIAGNOSIS — J449 Chronic obstructive pulmonary disease, unspecified: Secondary | ICD-10-CM | POA: Diagnosis present

## 2011-03-23 DIAGNOSIS — I951 Orthostatic hypotension: Secondary | ICD-10-CM

## 2011-03-23 DIAGNOSIS — I509 Heart failure, unspecified: Secondary | ICD-10-CM | POA: Diagnosis present

## 2011-03-23 DIAGNOSIS — R059 Cough, unspecified: Secondary | ICD-10-CM

## 2011-03-23 DIAGNOSIS — I129 Hypertensive chronic kidney disease with stage 1 through stage 4 chronic kidney disease, or unspecified chronic kidney disease: Secondary | ICD-10-CM | POA: Diagnosis present

## 2011-03-23 DIAGNOSIS — Z7982 Long term (current) use of aspirin: Secondary | ICD-10-CM

## 2011-03-23 DIAGNOSIS — I5021 Acute systolic (congestive) heart failure: Secondary | ICD-10-CM

## 2011-03-23 DIAGNOSIS — J9610 Chronic respiratory failure, unspecified whether with hypoxia or hypercapnia: Secondary | ICD-10-CM

## 2011-03-23 DIAGNOSIS — M545 Low back pain, unspecified: Secondary | ICD-10-CM

## 2011-03-23 DIAGNOSIS — R42 Dizziness and giddiness: Secondary | ICD-10-CM

## 2011-03-23 DIAGNOSIS — I5023 Acute on chronic systolic (congestive) heart failure: Principal | ICD-10-CM

## 2011-03-23 DIAGNOSIS — R0602 Shortness of breath: Secondary | ICD-10-CM

## 2011-03-23 DIAGNOSIS — Z888 Allergy status to other drugs, medicaments and biological substances status: Secondary | ICD-10-CM

## 2011-03-23 DIAGNOSIS — E119 Type 2 diabetes mellitus without complications: Secondary | ICD-10-CM

## 2011-03-23 DIAGNOSIS — N184 Chronic kidney disease, stage 4 (severe): Secondary | ICD-10-CM | POA: Diagnosis present

## 2011-03-23 DIAGNOSIS — J4489 Other specified chronic obstructive pulmonary disease: Secondary | ICD-10-CM

## 2011-03-23 DIAGNOSIS — R05 Cough: Secondary | ICD-10-CM

## 2011-03-23 DIAGNOSIS — J441 Chronic obstructive pulmonary disease with (acute) exacerbation: Secondary | ICD-10-CM

## 2011-03-23 DIAGNOSIS — D631 Anemia in chronic kidney disease: Secondary | ICD-10-CM | POA: Diagnosis present

## 2011-03-23 DIAGNOSIS — Z79899 Other long term (current) drug therapy: Secondary | ICD-10-CM

## 2011-03-23 DIAGNOSIS — E875 Hyperkalemia: Secondary | ICD-10-CM | POA: Diagnosis not present

## 2011-03-23 DIAGNOSIS — I10 Essential (primary) hypertension: Secondary | ICD-10-CM

## 2011-03-23 DIAGNOSIS — E1129 Type 2 diabetes mellitus with other diabetic kidney complication: Secondary | ICD-10-CM | POA: Diagnosis present

## 2011-03-23 DIAGNOSIS — N2581 Secondary hyperparathyroidism of renal origin: Secondary | ICD-10-CM

## 2011-03-23 DIAGNOSIS — D509 Iron deficiency anemia, unspecified: Secondary | ICD-10-CM

## 2011-03-23 DIAGNOSIS — J9600 Acute respiratory failure, unspecified whether with hypoxia or hypercapnia: Secondary | ICD-10-CM

## 2011-03-23 DIAGNOSIS — J9620 Acute and chronic respiratory failure, unspecified whether with hypoxia or hypercapnia: Secondary | ICD-10-CM

## 2011-03-23 DIAGNOSIS — R109 Unspecified abdominal pain: Secondary | ICD-10-CM

## 2011-03-23 DIAGNOSIS — M79609 Pain in unspecified limb: Secondary | ICD-10-CM

## 2011-03-23 DIAGNOSIS — G5600 Carpal tunnel syndrome, unspecified upper limb: Secondary | ICD-10-CM

## 2011-03-23 DIAGNOSIS — T380X5A Adverse effect of glucocorticoids and synthetic analogues, initial encounter: Secondary | ICD-10-CM | POA: Diagnosis not present

## 2011-03-23 DIAGNOSIS — Z87891 Personal history of nicotine dependence: Secondary | ICD-10-CM

## 2011-03-23 DIAGNOSIS — I255 Ischemic cardiomyopathy: Secondary | ICD-10-CM

## 2011-03-23 DIAGNOSIS — R0789 Other chest pain: Secondary | ICD-10-CM

## 2011-03-23 DIAGNOSIS — R079 Chest pain, unspecified: Secondary | ICD-10-CM

## 2011-03-23 DIAGNOSIS — Z794 Long term (current) use of insulin: Secondary | ICD-10-CM

## 2011-03-23 HISTORY — DX: Ischemic cardiomyopathy: I25.5

## 2011-03-23 HISTORY — DX: Acute systolic (congestive) heart failure: I50.21

## 2011-03-23 LAB — DIFFERENTIAL
Basophils Absolute: 0 10*3/uL (ref 0.0–0.1)
Eosinophils Absolute: 0.1 10*3/uL (ref 0.0–0.7)
Eosinophils Relative: 1 % (ref 0–5)
Lymphocytes Relative: 11 % — ABNORMAL LOW (ref 12–46)
Neutrophils Relative %: 79 % — ABNORMAL HIGH (ref 43–77)

## 2011-03-23 LAB — CBC
Hemoglobin: 9.4 g/dL — ABNORMAL LOW (ref 12.0–15.0)
MCH: 27.6 pg (ref 26.0–34.0)
MCV: 87.6 fL (ref 78.0–100.0)
Platelets: 224 10*3/uL (ref 150–400)
Platelets: 226 10*3/uL (ref 150–400)
RBC: 3.41 MIL/uL — ABNORMAL LOW (ref 3.87–5.11)
RDW: 15 % (ref 11.5–15.5)
WBC: 10.9 10*3/uL — ABNORMAL HIGH (ref 4.0–10.5)
WBC: 10.2 10*3/uL (ref 4.0–10.5)

## 2011-03-23 LAB — GLUCOSE, CAPILLARY
Glucose-Capillary: 214 mg/dL — ABNORMAL HIGH (ref 70–99)
Glucose-Capillary: 272 mg/dL — ABNORMAL HIGH (ref 70–99)
Glucose-Capillary: 157 mg/dL — ABNORMAL HIGH (ref 70–99)
Glucose-Capillary: 105 mg/dL — ABNORMAL HIGH (ref 70–99)

## 2011-03-23 LAB — TSH: TSH: 0.456 u[IU]/mL (ref 0.350–4.500)

## 2011-03-23 LAB — CARDIAC PANEL(CRET KIN+CKTOT+MB+TROPI)
CK, MB: 5.4 ng/mL — ABNORMAL HIGH (ref 0.3–4.0)
Relative Index: 2.7 — ABNORMAL HIGH (ref 0.0–2.5)
Relative Index: 3 — ABNORMAL HIGH (ref 0.0–2.5)
Total CK: 197 U/L — ABNORMAL HIGH (ref 7–177)
Troponin I: <0.3 ng/mL (ref <0.30)
Troponin I: <0.3 ng/mL (ref <0.30)

## 2011-03-23 LAB — COMPREHENSIVE METABOLIC PANEL
ALT: 18 U/L (ref 0–35)
AST: 19 U/L (ref 0–37)
Albumin: 3 g/dL — ABNORMAL LOW (ref 3.5–5.2)
Albumin: 3 g/dL — ABNORMAL LOW (ref 3.5–5.2)
Alkaline Phosphatase: 49 U/L (ref 39–117)
Alkaline Phosphatase: 57 U/L (ref 39–117)
BUN: 26 mg/dL — ABNORMAL HIGH (ref 6–23)
CO2: 32 mEq/L (ref 19–32)
Chloride: 96 mEq/L (ref 96–112)
Creatinine, Ser: 1.23 mg/dL — ABNORMAL HIGH (ref 0.50–1.10)
GFR calc Af Amer: 50 mL/min — ABNORMAL LOW (ref >90)
GFR calc non Af Amer: 45 mL/min — ABNORMAL LOW (ref >90)
GFR calc non Af Amer: 43 mL/min — ABNORMAL LOW (ref >90)
Glucose, Bld: 153 mg/dL — ABNORMAL HIGH (ref 70–99)
Potassium: 3.9 mEq/L (ref 3.5–5.1)
Potassium: 4.2 mEq/L (ref 3.5–5.1)
Sodium: 136 mEq/L (ref 135–145)
Total Bilirubin: 0.3 mg/dL (ref 0.3–1.2)
Total Bilirubin: 0.3 mg/dL (ref 0.3–1.2)
Total Protein: 7.5 g/dL (ref 6.0–8.3)

## 2011-03-23 LAB — TROPONIN I: Troponin I: <0.3 ng/mL (ref <0.30)

## 2011-03-23 LAB — HEMOGLOBIN A1C
Hgb A1c MFr Bld: 7.1 % — ABNORMAL HIGH (ref <5.7)
Mean Plasma Glucose: 157 mg/dL — ABNORMAL HIGH (ref <117)

## 2011-03-23 LAB — LACTIC ACID, PLASMA: Lactic Acid, Venous: 0.6 mmol/L (ref 0.5–2.2)

## 2011-03-23 LAB — PRO B NATRIURETIC PEPTIDE: Pro B Natriuretic peptide (BNP): 8064 pg/mL — ABNORMAL HIGH (ref 0–125)

## 2011-03-23 MED ORDER — ALBUTEROL SULFATE (5 MG/ML) 0.5% IN NEBU
2.5000 mg | INHALATION_SOLUTION | Freq: Four times a day (QID) | RESPIRATORY_TRACT | Status: DC
  Administered 2011-03-23 – 2011-03-25 (×8): 2.5 mg via RESPIRATORY_TRACT
  Filled 2011-03-23 (×7): qty 0.5

## 2011-03-23 MED ORDER — FUROSEMIDE 10 MG/ML IJ SOLN
40.0000 mg | Freq: Once | INTRAMUSCULAR | Status: AC
  Administered 2011-03-23: 40 mg via INTRAVENOUS
  Filled 2011-03-23: qty 4

## 2011-03-23 MED ORDER — IPRATROPIUM BROMIDE 0.02 % IN SOLN
0.5000 mg | Freq: Four times a day (QID) | RESPIRATORY_TRACT | Status: DC
  Administered 2011-03-23 – 2011-03-31 (×27): 0.5 mg via RESPIRATORY_TRACT
  Filled 2011-03-23 (×29): qty 2.5

## 2011-03-23 MED ORDER — ASPIRIN EC 325 MG PO TBEC
325.0000 mg | DELAYED_RELEASE_TABLET | Freq: Every day | ORAL | Status: DC
  Administered 2011-03-23 – 2011-04-01 (×10): 325 mg via ORAL
  Filled 2011-03-23 (×10): qty 1

## 2011-03-23 MED ORDER — ALPRAZOLAM 0.25 MG PO TABS
0.2500 mg | ORAL_TABLET | Freq: Three times a day (TID) | ORAL | Status: DC | PRN
  Administered 2011-03-23 – 2011-03-31 (×9): 0.25 mg via ORAL
  Filled 2011-03-23 (×9): qty 1

## 2011-03-23 MED ORDER — CITALOPRAM HYDROBROMIDE 40 MG PO TABS
40.0000 mg | ORAL_TABLET | Freq: Every day | ORAL | Status: DC
  Administered 2011-03-23 – 2011-04-01 (×10): 40 mg via ORAL
  Filled 2011-03-23 (×10): qty 1

## 2011-03-23 MED ORDER — ACETAMINOPHEN 325 MG PO TABS
975.0000 mg | ORAL_TABLET | Freq: Once | ORAL | Status: AC
  Administered 2011-03-23: 975 mg via ORAL
  Filled 2011-03-23: qty 3

## 2011-03-23 MED ORDER — MOXIFLOXACIN HCL IN NACL 400 MG/250ML IV SOLN
400.0000 mg | INTRAVENOUS | Status: DC
  Administered 2011-03-23 – 2011-03-25 (×3): 400 mg via INTRAVENOUS
  Filled 2011-03-23 (×4): qty 250

## 2011-03-23 MED ORDER — ACETAMINOPHEN 325 MG PO TABS
650.0000 mg | ORAL_TABLET | Freq: Four times a day (QID) | ORAL | Status: DC | PRN
  Administered 2011-03-27: 650 mg via ORAL
  Filled 2011-03-23 (×2): qty 2

## 2011-03-23 MED ORDER — ONDANSETRON HCL 4 MG PO TABS: 4.0000 mg | ORAL_TABLET | Freq: Four times a day (QID) | ORAL | Status: DC | PRN

## 2011-03-23 MED ORDER — ONE-DAILY MULTI VITAMINS PO TABS: 1.0000 | ORAL_TABLET | Freq: Every day | ORAL | Status: DC

## 2011-03-23 MED ORDER — CALCITRIOL 0.25 MCG PO CAPS
0.2500 ug | ORAL_CAPSULE | Freq: Every day | ORAL | Status: DC
  Administered 2011-03-23 – 2011-04-01 (×10): 0.25 ug via ORAL
  Filled 2011-03-23 (×10): qty 1

## 2011-03-23 MED ORDER — BIOTENE DRY MOUTH MT LIQD
15.0000 mL | Freq: Two times a day (BID) | OROMUCOSAL | Status: DC
Start: 2011-03-23 — End: 2011-04-02
  Administered 2011-03-24 – 2011-03-31 (×11): 15 mL via OROMUCOSAL

## 2011-03-23 MED ORDER — CLONIDINE HCL 0.1 MG PO TABS
0.1000 mg | ORAL_TABLET | Freq: Three times a day (TID) | ORAL | Status: DC
  Administered 2011-03-23 – 2011-03-24 (×4): 0.1 mg via ORAL
  Filled 2011-03-23 (×6): qty 1

## 2011-03-23 MED ORDER — INSULIN ASPART 100 UNIT/ML ~~LOC~~ SOLN
0.0000 [IU] | Freq: Three times a day (TID) | SUBCUTANEOUS | Status: DC
  Administered 2011-03-23: 2 [IU] via SUBCUTANEOUS
  Administered 2011-03-23: 5 [IU] via SUBCUTANEOUS
  Administered 2011-03-24: 3 [IU] via SUBCUTANEOUS
  Administered 2011-03-24: 1 [IU] via SUBCUTANEOUS
  Administered 2011-03-25: 3 [IU] via SUBCUTANEOUS
  Administered 2011-03-25 – 2011-03-26 (×2): 1 [IU] via SUBCUTANEOUS
  Administered 2011-03-26: 3 [IU] via SUBCUTANEOUS
  Administered 2011-03-27: 13:00:00 via SUBCUTANEOUS
  Administered 2011-03-27: 3 [IU] via SUBCUTANEOUS
  Administered 2011-03-28: 1 [IU] via SUBCUTANEOUS
  Administered 2011-03-28: 7 [IU] via SUBCUTANEOUS
  Administered 2011-03-29: 2 [IU] via SUBCUTANEOUS
  Administered 2011-03-29: 7 [IU] via SUBCUTANEOUS
  Administered 2011-03-30 – 2011-03-31 (×2): 1 [IU] via SUBCUTANEOUS
  Administered 2011-03-31: 2 [IU] via SUBCUTANEOUS
  Administered 2011-04-01: 1 [IU] via SUBCUTANEOUS

## 2011-03-23 MED ORDER — ONDANSETRON HCL 4 MG/2ML IJ SOLN: 4.0000 mg | Freq: Four times a day (QID) | INTRAMUSCULAR | Status: DC | PRN

## 2011-03-23 MED ORDER — PREDNISONE 20 MG PO TABS
60.0000 mg | ORAL_TABLET | Freq: Once | ORAL | Status: AC
  Administered 2011-03-23: 60 mg via ORAL
  Filled 2011-03-23: qty 3

## 2011-03-23 MED ORDER — PREDNISONE 20 MG PO TABS
40.0000 mg | ORAL_TABLET | Freq: Every day | ORAL | Status: DC
  Filled 2011-03-23 (×2): qty 2

## 2011-03-23 MED ORDER — ACETAMINOPHEN 650 MG RE SUPP: 650.0000 mg | Freq: Four times a day (QID) | RECTAL | Status: DC | PRN

## 2011-03-23 MED ORDER — ADULT MULTIVITAMIN W/MINERALS CH
1.0000 | ORAL_TABLET | Freq: Every day | ORAL | Status: DC
  Administered 2011-03-23 – 2011-04-01 (×10): 1 via ORAL
  Filled 2011-03-23 (×10): qty 1

## 2011-03-23 MED ORDER — FUROSEMIDE 10 MG/ML IJ SOLN
40.0000 mg | Freq: Every day | INTRAMUSCULAR | Status: DC
  Administered 2011-03-24: 40 mg via INTRAVENOUS
  Filled 2011-03-23 (×2): qty 4

## 2011-03-23 MED ORDER — ALBUTEROL SULFATE (5 MG/ML) 0.5% IN NEBU
2.5000 mg | INHALATION_SOLUTION | RESPIRATORY_TRACT | Status: DC | PRN
  Administered 2011-03-27: 2.5 mg via RESPIRATORY_TRACT
  Filled 2011-03-23: qty 0.5

## 2011-03-23 MED ORDER — ALBUTEROL SULFATE (5 MG/ML) 0.5% IN NEBU
5.0000 mg | INHALATION_SOLUTION | Freq: Once | RESPIRATORY_TRACT | Status: AC
  Administered 2011-03-23: 5 mg via RESPIRATORY_TRACT
  Filled 2011-03-23: qty 1

## 2011-03-23 MED ORDER — SODIUM CHLORIDE 0.9 % IJ SOLN: 3.0000 mL | Freq: Two times a day (BID) | INTRAMUSCULAR | Status: DC

## 2011-03-23 MED ORDER — DEXTROSE 5 % IV SOLN
500.0000 mg | Freq: Once | INTRAVENOUS | Status: AC
  Administered 2011-03-23: 500 mg via INTRAVENOUS
  Filled 2011-03-23: qty 500

## 2011-03-23 MED ORDER — SODIUM CHLORIDE 0.9 % IJ SOLN
3.0000 mL | Freq: Two times a day (BID) | INTRAMUSCULAR | Status: DC
  Administered 2011-03-23 – 2011-04-01 (×19): 3 mL via INTRAVENOUS

## 2011-03-23 MED ORDER — BUDESONIDE 0.5 MG/2ML IN SUSP
0.5000 mg | Freq: Two times a day (BID) | RESPIRATORY_TRACT | Status: DC
  Administered 2011-03-23 – 2011-04-01 (×17): 0.5 mg via RESPIRATORY_TRACT
  Filled 2011-03-23 (×22): qty 2

## 2011-03-23 MED ORDER — ALBUTEROL SULFATE (5 MG/ML) 0.5% IN NEBU: 2.5000 mg | INHALATION_SOLUTION | RESPIRATORY_TRACT | Status: DC | PRN

## 2011-03-23 MED ORDER — INSULIN ASPART PROT & ASPART (70-30 MIX) 100 UNIT/ML ~~LOC~~ SUSP
35.0000 [IU] | Freq: Two times a day (BID) | SUBCUTANEOUS | Status: DC
  Administered 2011-03-23 – 2011-03-27 (×8): 35 [IU] via SUBCUTANEOUS
  Filled 2011-03-23 (×2): qty 3

## 2011-03-23 NOTE — Progress Notes (Signed)
Patient admitted to the unit from the ED via stretcher. Patient is alert and oriented and ambulatory with stand by assist. Patient is capable of verbalizing needs. Oriented to the unit, staff, and call bell. Patient is hemodynamically stable. Initiate on tele monitoring. Continues on oxygen at 4L by nasal canula. Will continue to monitor.

## 2011-03-23 NOTE — ED Provider Notes (Signed)
History     CSN: [id number]  Arrival date & time 03/23/11  0247   First MD Initiated Contact with Patient 03/23/11 0251      Chief Complaint  Patient presents with  . Weakness     HPI The patient p/w  Dyspnea (w associated chest pain) and generalized fatigue.  Her symptoms began gradually 2 days ago.  Since onset symptoms have been worsening.  She also complains of cough, dark urine.  No sustained relief with medications, though her nebulizers improve her condition transiently.  No clear exacerbating factor.  The patient notes chest pain presents with cough, otherwise not pleuritic.  No exertional worsening of her symptoms, the patient is largely sedentary.  She also complains of fever, denies chills.  No emesis, no diarrhea. Past Medical History  Diagnosis Date  . RENAL INSUFFICIENCY 10/15/2008  . Edema 06/18/2008  . PNEUMONIA 12/01/2007  . CARPAL TUNNEL SYNDROME, BILATERAL 10/30/2007  . HAND PAIN, LEFT 09/04/2007  . Morbid obesity   . ANXIETY 11/08/2006  . HYPERTENSION 11/08/2006  . DIABETES MELLITUS, TYPE II 11/08/2006  . HYPERCHOLESTEROLEMIA 07/24/2009  . ANEMIA 07/24/2009  . COPD 11/08/2006    -HFA 50% p coaching 10/15/2009  . RESPIRATORY FAILURE, CHRONIC 10/15/2009  . BACK PAIN, LUMBAR 03/07/2009  . CHEST PAIN 07/24/2009  . Restless leg syndrome     History reviewed. No pertinent past surgical history.  Family History  Problem Relation Age of Onset  . Heart disease Mother   . Pancreatic cancer Mother   . Stroke Mother   . Asthma Paternal Grandfather   . Liver cancer Father   . Diabetes Father     History  Substance Use Topics  . Smoking status: Former Smoker -- 2.0 packs/day for 40 years    Quit date: 01/12/2008  . Smokeless tobacco: Not on file  . Alcohol Use: No    OB History    Grav Para Term Preterm Abortions TAB SAB Ect Mult Living                  Review of Systems  Constitutional:       HPI  HENT:       HPI otherwise negative  Eyes: Negative.     Respiratory:       HPI, otherwise negative  Cardiovascular:       HPI, otherwise nmegative  Gastrointestinal: Negative for vomiting.  Genitourinary:       HPI, otherwise negative  Musculoskeletal:       HPI, otherwise negative  Skin: Negative.   Neurological: Negative for syncope.    Allergies  Pioglitazone and Rosiglitazone maleate  Home Medications   Current Outpatient Rx  Name Route Sig Dispense Refill  . ALBUTEROL SULFATE (2.5 MG/3ML) 0.083% IN NEBU  USE ONE VIAL IN NEBULIZER 4 TIMES DAILY OR EVERY 6 HOURS AS NEEDED 75 mL 4  . ALBUTEROL SULFATE (5 MG/ML) 0.5% IN NEBU Nebulization Take 0.5 mLs (2.5 mg total) by nebulization every 3 (three) hours as needed for wheezing or shortness of breath. 20 mL 1  . ALPRAZOLAM 0.25 MG PO TABS  TAKE ONE TABLET BY MOUTH TWICE DAILY AS NEEDED FOR ANXIETY. NOT  TO  EXCEED  2  PER  DAY 60 tablet 2  . ASPIRIN 81 MG PO TABS Oral Take 81 mg by mouth daily.     Marland Kitchen CALCITRIOL 0.25 MCG PO CAPS Oral Take 0.25 mcg by mouth daily.      Marland Kitchen CITALOPRAM HYDROBROMIDE 40 MG PO  TABS Oral Take 40 mg by mouth daily.      Marland Kitchen CITALOPRAM HYDROBROMIDE 40 MG PO TABS  TAKE ONE TABLET BY MOUTH EVERY DAY 30 tablet 3  . CLONIDINE HCL 0.1 MG PO TABS Oral Take 0.1 mg by mouth 3 (three) times daily.     Marland Kitchen DM-GUAIFENESIN ER 30-600 MG PO TB12 Oral Take by mouth every 12 (twelve) hours as needed. 1-2 tablets. For congestion.    Marland Kitchen FAMOTIDINE 20 MG PO TABS Oral Take 20 mg by mouth daily as needed. For heartburn.    . FUROSEMIDE 40 MG PO TABS Oral Take 40 mg by mouth daily.      . FUROSEMIDE 40 MG PO TABS  TAKE ONE TABLET BY MOUTH EVERY DAY 30 tablet 3  . INSULIN ASPART PROT & ASPART (70-30) 100 UNIT/ML Allen SUSP Subcutaneous Inject 35 Units into the skin 2 (two) times daily with a meal. 10 mL 1  . ONE-DAILY MULTI VITAMINS PO TABS Oral Take 1 tablet by mouth daily.     . NON FORMULARY  Oxygen 4LPM  24/7     . OVER THE COUNTER MEDICATION Oral Take 1 tablet by mouth daily. Iron tablet.     Marland Kitchen PROAIR HFA 108 (90 BASE) MCG/ACT IN AERS  INHALE TWO PUFFS BY MOUTH EVERY 4 TO 6 HOURS AS NEEDED 9 g 6  . SYMBICORT 160-4.5 MCG/ACT IN AERO  INHALE TWO PUFFS BY MOUTH TWICE DAILY 11 g 3  . ULTICARE INSULIN SYRINGE 31G X 5/16" 1 ML MISC  USE ONE TWICE DAILY 100 each 3    BP 138/60  Temp(Src) 100.4 F (38 C) (Axillary)  Resp 22  Ht 5\' 6"  (1.676 m)  SpO2 95%  Physical Exam  Nursing note and vitals reviewed. Constitutional: She is oriented to person, place, and time. She appears well-developed and well-nourished. She appears distressed.       Morbidly obese female who appears older than her stated age  HENT:  Head: Normocephalic and atraumatic.  Eyes: Conjunctivae and EOM are normal.  Cardiovascular: Regular rhythm.  Tachycardia present.   Pulmonary/Chest: No stridor. Tachypnea noted. She is in respiratory distress. She has wheezes. She has rales.  Abdominal: She exhibits no distension.  Musculoskeletal: She exhibits no edema.  Neurological: She is alert and oriented to person, place, and time. No cranial nerve deficit.  Skin: Skin is warm. She is diaphoretic.  Psychiatric: She has a normal mood and affect.    ED Course  Procedures (including critical care time)  Labs Reviewed  CBC - Abnormal; Notable for the following:    WBC 10.9 (*)    RBC 3.30 (*)    Hemoglobin 9.1 (*)    HCT 28.9 (*)    All other components within normal limits  DIFFERENTIAL - Abnormal; Notable for the following:    Neutrophils Relative 79 (*)    Neutro Abs 8.7 (*)    Lymphocytes Relative 11 (*)    All other components within normal limits  COMPREHENSIVE METABOLIC PANEL  TROPONIN I  URINALYSIS, ROUTINE W REFLEX MICROSCOPIC  LACTIC ACID, PLASMA   No results found.   No diagnosis found.  Cardiac 110- st, abnormal Pulse ox 97% on [place] - abnormal   Date: 03/23/2011  Rate: 107  Rhythm: sinus tachycardia  QRS Axis: normal  Intervals: normal  ST/T Wave abnormalities: normal  Conduction  Disutrbances:none  Narrative Interpretation:   Old EKG Reviewed: changes noted ABNORMAL ECG  CXR reviewed by me  5:37 AM  The patient notes some relief w nebulizer treatment. MDM  This 66 year old female with multiple medical problems now presents with 2 days of dyspnea, cough, mild chest discomfort.  On exam she is uncomfortable appearing, tachycardic, tachypneic.  The patient's history of COPD, and multiple prior presentations for acute on chronic respiratory failure suggests that this is a similar episode.  The absence of unilateral lower extremity edema, prior PE or DVT his reassuring for low probability of this entity.  Given the patient's poor renal function, she was not scanned, do to the higher likelihood of COPD as primary etiology.  The patient was evaluated with cardiac enzymes, ECG, which will more reassuring for absence of ongoing coronary ischemia.  The patient received steroids and antibiotics and was admitted to the telemetry floor for further evaluation and management.      Gerhard Munch, MD 03/23/11 [phone]

## 2011-03-23 NOTE — Progress Notes (Signed)
CRITICAL VALUE ALERT  Critical value received:  CKMB 6.7  Date of notification:  03/23/2011   Time of notification:  6:20 PM  Critical value read back:yes  Nurse who received alert:  Doristine Devoid  MD notified (1st page):  Izola Price  Time of first page:  6:21 PM  MD notified (2nd page):  Time of second page:  Responding MD:  No response at this time. Patient is stable.  Time MD responded:  N/A

## 2011-03-23 NOTE — ED Notes (Signed)
PATIENT BACK FROM XRAY

## 2011-03-23 NOTE — ED Notes (Signed)
324mg  ASA given by EMS, took her own meds Xanax, Clonidine and Symbicort

## 2011-03-23 NOTE — H&P (Signed)
[name] is an 66 y.o. female.   PCP - Dr.Ellison. Chief Complaint: Shortness of breath. HPI: 66 year old female with known history of COPD on home oxygen and LV dysfunction per 2-D echo in September 2011 presented to the ER with increasing shortness of breath and cough over the last 3-4 days. Patient denies any fever chills but has had some chest pain when she coughs. Denies any nausea vomiting or abdominal pain or diarrhea. Patient in the ER was found to be wheezing and chest x-ray showing possible congestion. Patient has been admitted for further management. Patient has received Solu-Medrol nebulizer in the ER.  Past Medical History  Diagnosis Date  . RENAL INSUFFICIENCY 10/15/2008  . Edema 06/18/2008  . PNEUMONIA 12/01/2007  . CARPAL TUNNEL SYNDROME, BILATERAL 10/30/2007  . HAND PAIN, LEFT 09/04/2007  . Morbid obesity   . ANXIETY 11/08/2006  . HYPERTENSION 11/08/2006  . DIABETES MELLITUS, TYPE II 11/08/2006  . HYPERCHOLESTEROLEMIA 07/24/2009  . ANEMIA 07/24/2009  . COPD 11/08/2006    -HFA 50% p coaching 10/15/2009  . RESPIRATORY FAILURE, CHRONIC 10/15/2009  . BACK PAIN, LUMBAR 03/07/2009  . CHEST PAIN 07/24/2009  . Restless leg syndrome     Past Surgical History  Procedure Date  . Abdominal hysterectomy     Family History  Problem Relation Age of Onset  . Heart disease Mother   . Pancreatic cancer Mother   . Stroke Mother   . Asthma Paternal Grandfather   . Liver cancer Father   . Diabetes Father    Social History:  reports that she quit smoking about 3 years ago. She does not have any smokeless tobacco history on file. She reports that she does not drink alcohol or use illicit drugs.  Allergies:  Allergies  Allergen Reactions  . Pioglitazone     REACTION: edema  . Rosiglitazone Maleate Nausea And Vomiting    Medications Prior to Admission  Medication Dose Route Frequency Provider Last Rate Last Dose  . acetaminophen (TYLENOL) tablet 975 mg  975 mg Oral Once Gerhard Munch, MD   975 mg at 03/23/11 0316  . albuterol (PROVENTIL) (5 MG/ML) 0.5% nebulizer solution 2.5 mg  2.5 mg Nebulization Q4H PRN Gerhard Munch, MD      . albuterol (PROVENTIL) (5 MG/ML) 0.5% nebulizer solution 5 mg  5 mg Nebulization Once Gerhard Munch, MD   5 mg at 03/23/11 0333  . azithromycin (ZITHROMAX) 500 mg in dextrose 5 % 250 mL IVPB  500 mg Intravenous Once Gerhard Munch, MD   500 mg at 03/23/11 0458  . predniSONE (DELTASONE) tablet 60 mg  60 mg Oral Once Gerhard Munch, MD   60 mg at 03/23/11 0453   Medications Prior to Admission  Medication Sig Dispense Refill  . albuterol (PROVENTIL) (2.5 MG/3ML) 0.083% nebulizer solution USE ONE VIAL IN NEBULIZER 4 TIMES DAILY OR EVERY 6 HOURS AS NEEDED  75 mL  4  . ALPRAZolam (XANAX) 0.25 MG tablet TAKE ONE TABLET BY MOUTH TWICE DAILY AS NEEDED FOR ANXIETY. NOT  TO  EXCEED  2  PER  DAY  60 tablet  2  . aspirin 81 MG tablet Take 81 mg by mouth daily.       . calcitRIOL (ROCALTROL) 0.25 MCG capsule Take 0.25 mcg by mouth daily.        . citalopram (CELEXA) 40 MG tablet Take 40 mg by mouth daily.        . cloNIDine (CATAPRES) 0.1 MG tablet Take 0.1 mg by  mouth 3 (three) times daily.       . furosemide (LASIX) 40 MG tablet Take 40 mg by mouth daily.        . insulin aspart protamine-insulin aspart (NOVOLOG 70/30) (70-30) 100 UNIT/ML injection Inject 35 Units into the skin 2 (two) times daily with a meal.  10 mL  1  . Multiple Vitamin (MULTIVITAMIN) tablet Take 1 tablet by mouth daily.       Marland Kitchen OVER THE COUNTER MEDICATION Take 1 tablet by mouth daily. Iron tablet.      . SYMBICORT 160-4.5 MCG/ACT inhaler INHALE TWO PUFFS BY MOUTH TWICE DAILY  11 g  3  . NON FORMULARY Oxygen 4LPM  24/7       . ULTICARE INSULIN SYRINGE 31G X 5/16" 1 ML MISC USE ONE TWICE DAILY  100 each  3    Results for orders placed during the hospital encounter of 03/23/11 (from the past 48 hour(s))  COMPREHENSIVE METABOLIC PANEL     Status: Abnormal   Collection Time     03/23/11  3:18 AM      Component Value Range Comment   Sodium 136  135 - 145 (mEq/L)    Potassium 3.9  3.5 - 5.1 (mEq/L)    Chloride 96  96 - 112 (mEq/L)    CO2 32  19 - 32 (mEq/L)    Glucose, Bld 153 (*) 70 - 99 (mg/dL)    BUN 26 (*) 6 - 23 (mg/dL)    Creatinine, Ser 1.61 (*) 0.50 - 1.10 (mg/dL)    Calcium 8.6  8.4 - 10.5 (mg/dL)    Total Protein 7.1  6.0 - 8.3 (g/dL)    Albumin 3.0 (*) 3.5 - 5.2 (g/dL)    AST 17  0 - 37 (U/L)    ALT 16  0 - 35 (U/L)    Alkaline Phosphatase 49  39 - 117 (U/L)    Total Bilirubin 0.3  0.3 - 1.2 (mg/dL)    GFR calc non Af Amer 45 (*) >90 (mL/min)    GFR calc Af Amer 52 (*) >90 (mL/min)   CBC     Status: Abnormal   Collection Time   03/23/11  3:18 AM      Component Value Range Comment   WBC 10.9 (*) 4.0 - 10.5 (K/uL)    RBC 3.30 (*) 3.87 - 5.11 (MIL/uL)    Hemoglobin 9.1 (*) 12.0 - 15.0 (g/dL)    HCT 09.6 (*) 04.5 - 46.0 (%)    MCV 87.6  78.0 - 100.0 (fL)    MCH 27.6  26.0 - 34.0 (pg)    MCHC 31.5  30.0 - 36.0 (g/dL)    RDW 40.9  81.1 - 91.4 (%)    Platelets 224  150 - 400 (K/uL)   DIFFERENTIAL     Status: Abnormal   Collection Time   03/23/11  3:18 AM      Component Value Range Comment   Neutrophils Relative 79 (*) 43 - 77 (%)    Neutro Abs 8.7 (*) 1.7 - 7.7 (K/uL)    Lymphocytes Relative 11 (*) 12 - 46 (%)    Lymphs Abs 1.2  0.7 - 4.0 (K/uL)    Monocytes Relative 8  3 - 12 (%)    Monocytes Absolute 0.9  0.1 - 1.0 (K/uL)    Eosinophils Relative 1  0 - 5 (%)    Eosinophils Absolute 0.1  0.0 - 0.7 (K/uL)    Basophils Relative 0  0 - 1 (%)    Basophils Absolute 0.0  0.0 - 0.1 (K/uL)   LACTIC ACID, PLASMA     Status: Normal   Collection Time   03/23/11  3:19 AM      Component Value Range Comment   Lactic Acid, Venous 0.6  0.5 - 2.2 (mmol/L)    Dg Chest 2 View  03/23/2011  *RADIOLOGY REPORT*  Clinical Data: Shortness of breath, cough, hypertension.  CHEST - 2 VIEW  Comparison: 11/25/2010  Findings: Cardiomegaly.  Central vascular  congestion.  Mild interstitial prominence and lung base opacity. Hyperinflation with increased AP diameter.  A couple sub centimeter nodules within the lung apices. No pneumothorax.  No pleural effusion.  No acute osseous abnormality.  IMPRESSION: Cardiomegaly with central vascular congestion.  Mild interstitial prominence may reflect edema.  Mild bibasilar opacities, likely atelectasis.  There are a few small nodular opacities within the upper lungs, nonspecific. A prior CT favored nodular scarring.  Continued attention recommended or follow-up.  Original Report Authenticated By: Waneta Martins, M.D.    Review of Systems  Constitutional: Negative.   HENT: Negative.   Eyes: Negative.   Respiratory: Positive for cough, sputum production and shortness of breath.   Cardiovascular: Positive for chest pain.  Gastrointestinal: Negative.   Genitourinary: Negative.   Musculoskeletal: Negative.   Skin: Negative.   Neurological: Negative.   Endo/Heme/Allergies: Negative.   Psychiatric/Behavioral: Negative.     Blood pressure 117/58, pulse 100, temperature 99 F (37.2 C), temperature source Axillary, resp. rate 23, height 5\' 6"  (1.676 m), SpO2 94.00%. Physical Exam  Constitutional: She is oriented to person, place, and time. She appears well-developed and well-nourished. No distress.  HENT:  Head: Normocephalic and atraumatic.  Right Ear: External ear normal.  Left Ear: External ear normal.  Nose: Nose normal.  Mouth/Throat: Oropharynx is clear and moist. No oropharyngeal exudate.  Eyes: Conjunctivae are normal. Pupils are equal, round, and reactive to light. Right eye exhibits no discharge. Left eye exhibits no discharge. No scleral icterus.  Neck: Normal range of motion. Neck supple.  Cardiovascular: Regular rhythm.        Tachycardia.  Respiratory: Effort normal. No respiratory distress. She has wheezes. She has rales.  GI: Soft. Bowel sounds are normal. She exhibits no distension. There  is no tenderness. There is no rebound.  Musculoskeletal: Normal range of motion. She exhibits no edema and no tenderness.  Neurological: She is alert and oriented to person, place, and time.       Moves all limbs.  Skin: Skin is warm and dry. No rash noted. She is not diaphoretic. No erythema.  Psychiatric: Her behavior is normal.     Assessment/Plan #1. Shortness of breath most likely due to a combination of COPD and CHF - at this time we will continue nebulizers with Pulmicort steroids and antibiotics. I have also added Lasix 40 mg IV. Closely follow intake output and daily weights. Recheck 2-D echo. #2. Atypical chest pain - she is chest pain-free at this time. She has chest pain only when she coughs. We'll cycle cardiac markers. #3. History of diabetes mellitus type 2, CK D., And chronic anemia - continue present medications and closely follow CBC for anemia which could be from chronic kidney disease. For her diabetes I have placed patient on her regular medications and sliding scale coverage. #4. History of pulmonary nodule which is stable.  CODE STATUS - full code.  Eduard Clos 03/23/2011, 6:56 AM

## 2011-03-23 NOTE — ED Notes (Signed)
Patient c/o of pleurtic chest pain for 2 days along with cough (prod of greenish sputum), incont of green urine, fever, weakness, SOB

## 2011-03-23 NOTE — Progress Notes (Signed)
Patient ID: [name], female   DOB: 1945/12/10, 66 y.o.   MRN: [id number]  Subjective: No events overnight. Patient denies chest pain, shortness of breath, abdominal pain.   Objective:  Vital signs in last 24 hours:  Filed Vitals:   03/23/11 1419 03/23/11 1823 03/23/11 1932 03/23/11 2048  BP:  135/88 131/65   Pulse:  98 96   Temp:  97.2 F (36.2 C) 98.3 F (36.8 C)   TempSrc:  Oral Oral   Resp:  22 22 22   Height:      Weight:      SpO2: 92% 95% 96%     Intake/Output from previous day:   Intake/Output Summary (Last 24 hours) at 03/23/11 2223 Last data filed at 03/23/11 1828  Gross per 24 hour  Intake    360 ml  Output      3 ml  Net    357 ml    Physical Exam: General: Alert, awake, oriented x3, in no acute distress. HEENT: No bruits, no goiter. Moist mucous membranes, no scleral icterus, no conjunctival pallor. Heart: Regular rate and rhythm, S1/S2 +, no murmurs, rubs, gallops. Lungs: Clear to auscultation bilaterally with end expiratory wheezing, no rhonchi, no rales.  Abdomen: Soft, nontender, nondistended, positive bowel sounds. Extremities: No clubbing or cyanosis, no pitting edema,  positive pedal pulses. Neuro: Grossly nonfocal.  Lab Results:  Basic Metabolic Panel:    Component Value Date/Time   NA 136 03/23/2011 1005   K 4.2 03/23/2011 1005   CL 95* 03/23/2011 1005   CO2 30 03/23/2011 1005   BUN 28* 03/23/2011 1005   CREATININE 1.28* 03/23/2011 1005   GLUCOSE 228* 03/23/2011 1005   CALCIUM 8.9 03/23/2011 1005   CALCIUM 9.1 09/03/2010 1643   CBC:    Component Value Date/Time   WBC 10.2 03/23/2011 1005   HGB 9.4* 03/23/2011 1005   HCT 30.0* 03/23/2011 1005   PLT 226 03/23/2011 1005   MCV 88.0 03/23/2011 1005   NEUTROABS 8.7* 03/23/2011 0318   LYMPHSABS 1.2 03/23/2011 0318   MONOABS 0.9 03/23/2011 0318   EOSABS 0.1 03/23/2011 0318   BASOSABS 0.0 03/23/2011 0318      Lab 03/23/11 1005 03/23/11 0318  WBC 10.2 10.9*  HGB 9.4* 9.1*  HCT 30.0* 28.9*  PLT  226 224  MCV 88.0 87.6  MCH 27.6 27.6  MCHC 31.3 31.5  RDW 15.0 15.0  LYMPHSABS -- 1.2  MONOABS -- 0.9  EOSABS -- 0.1  BASOSABS -- 0.0  BANDABS -- --    Lab 03/23/11 1005 03/23/11 0318  NA 136 136  K 4.2 3.9  CL 95* 96  CO2 30 32  GLUCOSE 228* 153*  BUN 28* 26*  CREATININE 1.28* 1.23*  CALCIUM 8.9 8.6  MG -- --   No results found for this basename: INR:5,PROTIME:5 in the last 168 hours Cardiac markers:  Lab 03/23/11 1713 03/23/11 1005 03/23/11 0620  CKMB 6.7* 5.4* --  TROPONINI <0.30 <0.30 <0.30  MYOGLOBIN -- -- --   No components found with this basename: POCBNP:3 No results found for this or any previous visit (from the past 240 hour(s)).  Studies/Results: Dg Chest 2 View  03/23/2011  *RADIOLOGY REPORT*  Clinical Data: Shortness of breath, cough, hypertension.  CHEST - 2 VIEW  Comparison: 11/25/2010  Findings: Cardiomegaly.  Central vascular congestion.  Mild interstitial prominence and lung base opacity. Hyperinflation with increased AP diameter.  A couple sub centimeter nodules within the lung apices. No pneumothorax.  No pleural  effusion.  No acute osseous abnormality.  IMPRESSION: Cardiomegaly with central vascular congestion.  Mild interstitial prominence may reflect edema.  Mild bibasilar opacities, likely atelectasis.  There are a few small nodular opacities within the upper lungs, nonspecific. A prior CT favored nodular scarring.  Continued attention recommended or follow-up.  Original Report Authenticated By: Waneta Martins, M.D.    Medications: Scheduled Meds:   . acetaminophen  975 mg Oral Once  . albuterol  2.5 mg Nebulization Q6H  . albuterol  5 mg Nebulization Once  . antiseptic oral rinse  15 mL Mouth Rinse BID  . aspirin EC  325 mg Oral Daily  . azithromycin  500 mg Intravenous Once  . budesonide  0.5 mg Nebulization BID  . calcitRIOL  0.25 mcg Oral Daily  . citalopram  40 mg Oral Daily  . cloNIDine  0.1 mg Oral TID  . furosemide  40 mg  Intravenous QAC breakfast  . furosemide  40 mg Intravenous Once  . insulin aspart  0-9 Units Subcutaneous TID WC  . insulin aspart protamine-insulin aspart  35 Units Subcutaneous BID WC  . ipratropium  0.5 mg Nebulization Q6H  . moxifloxacin  400 mg Intravenous Q24H  . mulitivitamin with minerals  1 tablet Oral Daily  . predniSONE  60 mg Oral Once  . sodium chloride  3 mL Intravenous Q12H  . DISCONTD: multivitamin  1 tablet Oral Daily  . DISCONTD: sodium chloride  3 mL Intravenous Q12H   Continuous Infusions:  PRN Meds:.acetaminophen, acetaminophen, albuterol, ALPRAZolam, ondansetron (ZOFRAN) IV, ondansetron, DISCONTD: albuterol  Assessment/Plan:  Principal Problem:  *SOB (shortness of breath) - likely multifactorial innature and secondary to pulmonary vascular congestion with COPD exacerbation and CAP even though the PNA was not evident on CXR - pt is clinically improving - will continue empiric antibiotics, nebulizers PRN and scheduled - continue Lasix and monitor strict I's and O's - BMP in AM  Active Problems:  DIABETES MELLITUS, TYPE II - this has remained stable during the hospitalization   HYPERTENSION - this has remained stable during the hospitalization   EDUCATION - test results and diagnostic studies were discussed with patient  - patient erbalized the understanding - questions were answered at the bedside and contact information was provided for additional questions or concerns   LOS: 0 days   MAGICK-Joud Pettinato 03/23/2011, 10:23 PM  [facility] Pager: [phone]

## 2011-03-24 DIAGNOSIS — I5023 Acute on chronic systolic (congestive) heart failure: Principal | ICD-10-CM | POA: Diagnosis present

## 2011-03-24 DIAGNOSIS — R079 Chest pain, unspecified: Secondary | ICD-10-CM

## 2011-03-24 DIAGNOSIS — I5021 Acute systolic (congestive) heart failure: Secondary | ICD-10-CM

## 2011-03-24 DIAGNOSIS — J9620 Acute and chronic respiratory failure, unspecified whether with hypoxia or hypercapnia: Secondary | ICD-10-CM

## 2011-03-24 DIAGNOSIS — R0789 Other chest pain: Secondary | ICD-10-CM

## 2011-03-24 DIAGNOSIS — I517 Cardiomegaly: Secondary | ICD-10-CM

## 2011-03-24 HISTORY — DX: Acute systolic (congestive) heart failure: I50.21

## 2011-03-24 LAB — FOLATE: Folate: >20 ng/mL

## 2011-03-24 LAB — FERRITIN: Ferritin: 95 ng/mL (ref 10–291)

## 2011-03-24 LAB — GLUCOSE, CAPILLARY
Glucose-Capillary: 128 mg/dL — ABNORMAL HIGH (ref 70–99)
Glucose-Capillary: 208 mg/dL — ABNORMAL HIGH (ref 70–99)
Glucose-Capillary: 165 mg/dL — ABNORMAL HIGH (ref 70–99)

## 2011-03-24 LAB — BASIC METABOLIC PANEL
CO2: 36 mEq/L — ABNORMAL HIGH (ref 19–32)
Chloride: 94 mEq/L — ABNORMAL LOW (ref 96–112)
Creatinine, Ser: 1.44 mg/dL — ABNORMAL HIGH (ref 0.50–1.10)
Sodium: 136 mEq/L (ref 135–145)

## 2011-03-24 LAB — CBC
Hemoglobin: 9 g/dL — ABNORMAL LOW (ref 12.0–15.0)
MCV: 88.6 fL (ref 78.0–100.0)
Platelets: 243 10*3/uL (ref 150–400)
RBC: 3.24 MIL/uL — ABNORMAL LOW (ref 3.87–5.11)
WBC: 9.9 10*3/uL (ref 4.0–10.5)

## 2011-03-24 LAB — IRON AND TIBC
Iron: 29 ug/dL — ABNORMAL LOW (ref 42–135)
TIBC: 265 ug/dL (ref 250–470)

## 2011-03-24 LAB — CARDIAC PANEL(CRET KIN+CKTOT+MB+TROPI): CK, MB: 7.4 ng/mL (ref 0.3–4.0)

## 2011-03-24 MED ORDER — CLONIDINE HCL 0.1 MG PO TABS
0.1000 mg | ORAL_TABLET | Freq: Two times a day (BID) | ORAL | Status: DC
  Administered 2011-03-24 – 2011-03-29 (×10): 0.1 mg via ORAL
  Filled 2011-03-24 (×11): qty 1

## 2011-03-24 MED ORDER — LOSARTAN POTASSIUM 25 MG PO TABS
25.0000 mg | ORAL_TABLET | Freq: Every day | ORAL | Status: DC
  Administered 2011-03-24: 25 mg via ORAL
  Filled 2011-03-24 (×2): qty 1

## 2011-03-24 MED ORDER — BENZONATATE 100 MG PO CAPS
200.0000 mg | ORAL_CAPSULE | Freq: Three times a day (TID) | ORAL | Status: DC
  Administered 2011-03-24 – 2011-04-01 (×26): 200 mg via ORAL
  Filled 2011-03-24 (×30): qty 2

## 2011-03-24 MED ORDER — METHYLPREDNISOLONE SODIUM SUCC 125 MG IJ SOLR
60.0000 mg | INTRAMUSCULAR | Status: DC
  Administered 2011-03-24: 40 mg via INTRAVENOUS
  Administered 2011-03-25 – 2011-03-26 (×2): 60 mg via INTRAVENOUS
  Filled 2011-03-24 (×4): qty 0.96

## 2011-03-24 MED ORDER — FUROSEMIDE 10 MG/ML IJ SOLN
40.0000 mg | Freq: Two times a day (BID) | INTRAMUSCULAR | Status: AC
  Administered 2011-03-24 – 2011-03-25 (×2): 40 mg via INTRAVENOUS
  Filled 2011-03-24 (×2): qty 4

## 2011-03-24 NOTE — Progress Notes (Signed)
Patient ID: [name], female   DOB: [birth year], 66 y.o.   MRN: [id number] Patient ID: [name], female   DOB: 01/06/1946, 66 y.o.   MRN: [id number]  Subjective: Patient states some improvement with shortness of breath after breathing treatment. Still with a cough.  Objective:  Vital signs in last 24 hours:  Filed Vitals:   03/23/11 2048 03/24/11 0249 03/24/11 0449 03/24/11 0831  BP:   126/65   Pulse:   88   Temp:   97.6 F (36.4 C)   TempSrc:   Oral   Resp: 22 20 22    Height:      Weight:      SpO2:   97% 93%    Intake/Output from previous day:   Intake/Output Summary (Last 24 hours) at 03/24/11 0913 Last data filed at 03/23/11 1828  Gross per 24 hour  Intake    360 ml  Output      3 ml  Net    357 ml    Physical Exam: General: Alert, awake, oriented x3, in no acute distress. HEENT: No bruits, no goiter. Moist mucous membranes, no scleral icterus, no conjunctival pallor. Heart: Regular rate rhythm no murmurs rubs or gallops. Lungs: Poor to fair air movement. Expiratory wheezes. Right basilar crackles. Abdomen: Soft, nontender, nondistended, positive bowel sounds. Extremities: No clubbing or cyanosis, 1+ bilateral pitting edema,  positive pedal pulses. Neuro: Grossly nonfocal.  Lab Results:  Basic Metabolic Panel:    Component Value Date/Time   NA 136 03/24/2011 0128   K 4.4 03/24/2011 0128   CL 94* 03/24/2011 0128   CO2 36* 03/24/2011 0128   BUN 38* 03/24/2011 0128   CREATININE 1.44* 03/24/2011 0128   GLUCOSE 179* 03/24/2011 0128   CALCIUM 9.1 03/24/2011 0128   CALCIUM 9.1 09/03/2010 1643   CBC:    Component Value Date/Time   WBC 9.9 03/24/2011 0128   HGB 9.0* 03/24/2011 0128   HCT 28.7* 03/24/2011 0128   PLT 243 03/24/2011 0128   MCV 88.6 03/24/2011 0128   NEUTROABS 8.7* 03/23/2011 0318   LYMPHSABS 1.2 03/23/2011 0318   MONOABS 0.9 03/23/2011 0318   EOSABS 0.1 03/23/2011 0318   BASOSABS 0.0 03/23/2011 0318      Lab 03/24/11 0128 03/23/11 1005 03/23/11  0318  WBC 9.9 10.2 10.9*  HGB 9.0* 9.4* 9.1*  HCT 28.7* 30.0* 28.9*  PLT 243 226 224  MCV 88.6 88.0 87.6  MCH 27.8 27.6 27.6  MCHC 31.4 31.3 31.5  RDW 14.8 15.0 15.0  LYMPHSABS -- -- 1.2  MONOABS -- -- 0.9  EOSABS -- -- 0.1  BASOSABS -- -- 0.0  BANDABS -- -- --    Lab 03/24/11 0128 03/23/11 1005 03/23/11 0318  NA 136 136 136  K 4.4 4.2 3.9  CL 94* 95* 96  CO2 36* 30 32  GLUCOSE 179* 228* 153*  BUN 38* 28* 26*  CREATININE 1.44* 1.28* 1.23*  CALCIUM 9.1 8.9 8.6  MG -- -- --   No results found for this basename: INR:5,PROTIME:5 in the last 168 hours Cardiac markers:   Lab 03/24/11 0128 03/23/11 1713 03/23/11 1005  CKMB 7.4* 6.7* 5.4*  TROPONINI <0.30 <0.30 <0.30  MYOGLOBIN -- -- --   No components found with this basename: POCBNP:3 No results found for this or any previous visit (from the past 240 hour(s)).  Studies/Results: Dg Chest 2 View  03/23/2011  *RADIOLOGY REPORT*  Clinical Data: Shortness of breath, cough, hypertension.  CHEST - 2 VIEW  Comparison: 11/25/2010  Findings: Cardiomegaly.  Central vascular congestion.  Mild interstitial prominence and lung base opacity. Hyperinflation with increased AP diameter.  A couple sub centimeter nodules within the lung apices. No pneumothorax.  No pleural effusion.  No acute osseous abnormality.  IMPRESSION: Cardiomegaly with central vascular congestion.  Mild interstitial prominence may reflect edema.  Mild bibasilar opacities, likely atelectasis.  There are a few small nodular opacities within the upper lungs, nonspecific. A prior CT favored nodular scarring.  Continued attention recommended or follow-up.  Original Report Authenticated By: Waneta Martins, M.D.    Medications: Scheduled Meds:    . albuterol  2.5 mg Nebulization Q6H  . antiseptic oral rinse  15 mL Mouth Rinse BID  . aspirin EC  325 mg Oral Daily  . benzonatate  200 mg Oral TID  . budesonide  0.5 mg Nebulization BID  . calcitRIOL  0.25 mcg Oral Daily    . citalopram  40 mg Oral Daily  . cloNIDine  0.1 mg Oral TID  . furosemide  40 mg Intravenous QAC breakfast  . insulin aspart  0-9 Units Subcutaneous TID WC  . insulin aspart protamine-insulin aspart  35 Units Subcutaneous BID WC  . ipratropium  0.5 mg Nebulization Q6H  . methylPREDNISolone (SOLU-MEDROL) injection  60 mg Intravenous Q24H  . moxifloxacin  400 mg Intravenous Q24H  . mulitivitamin with minerals  1 tablet Oral Daily  . sodium chloride  3 mL Intravenous Q12H  . DISCONTD: multivitamin  1 tablet Oral Daily  . DISCONTD: predniSONE  40 mg Oral Q breakfast  . DISCONTD: sodium chloride  3 mL Intravenous Q12H   Continuous Infusions:  PRN Meds:.acetaminophen, acetaminophen, albuterol, ALPRAZolam, ondansetron (ZOFRAN) IV, ondansetron, DISCONTD: albuterol  Assessment/Plan:  Principal Problem:  *SOB (shortness of breath) - likely multifactorial  secondary to pulmonary vascular congestion/ CHF exac with COPD exacerbation. - pt is still with some shortness of breath. Some expiratory wheezes. - will continue empiric antibiotics, nebulizers PRN and scheduled. We'll place on IV Solu-Medrol 60 mg daily. Will add Tessalon Perles. - continue Lasix and monitor strict I's and O's -Cardiac enzymes with negative troponins x3. 2-D echo is pending. Per last 2-D echo patient with a severely depressed EF. Will check a fasting lipid panel. Will consult with cardiology for further evaluation and management. - BMP, bnp in AM  Active Problems:  DIABETES MELLITUS, TYPE II - this has remained stable during the hospitalization   HYPERTENSION - this has remained stable during the hospitalization   Anemia Will check an anemia panel. Will guaiac stools x3. Follow H&H.  Renal insufficiency Patient with a history of chronic renal insufficiency. Creatinine is stable. Monitor closely with diuresis. If further increasing and renal function will change Lasix to oral Lasix.   LOS: 1 day    [facility] 03/24/2011, 9:13 AM  [facility] Pager: [phone]

## 2011-03-24 NOTE — Progress Notes (Signed)
Spoke with Dr.Thompson to just make him aware of the patients CKMB and total CK trending up. Lasix has been given this morning and pt SOB with exertion. No orders received will continue to monitor.

## 2011-03-24 NOTE — Consult Note (Addendum)
Referring Physician:  Dr. Janee Morn Primary Physician: Dr. Felicity Coyer Primary Cardiologist: None Reason for Consultation: CHF   HPI:  66 y/o woman with multiple medical problems including morbid obesity, COPD on 4L home O2, DM2, HTN and HL. Has had multiple admissions for respiratory failure.  Denies known h/o cardiac problems. Has never seen a cardiologist or had a cath or stress test. Echo from 9/12 - which I reviewed. Was interpreted as significant LV dysfunction but study of to poor quality to say more. On my review I think EF ~40% with probable inferolateral hypokinesis.  Now admitted with 2 weeks of increasing dyspnea with cough productive of green sputum. In ER CXR with ? CHF. BNP up from 3000 to 8000. Treated with nebs, steroids, lasix and abx. Starting to feel better. Says dyspnea worse with any activity. Also has occasional chest discomfort worse with coughing. Denies orthopnea, PND or LE edema. She does not weigh herself at home. D/w weight in 9/12 was 114 kg. Now 120 kg.    Review of Systems:     Cardiac Review of Systems: {Y] = yes [ ]  = no  Chest Pain [ y   ]  Resting SOB [ y  ] Exertional SOB  Cove.Etienne  ]  Orthopnea [  ]   Pedal Edema [   ]    Palpitations [  ] Syncope  [  ]   Presyncope [   ]  General Review of Systems: [Y] = yes [  ]=no Constitional: recent weight change [  ]; anorexia [  ]; fatigue [ y ]; nausea [  ]; night sweats [  ]; fever [ y ]; or chills Cove.Etienne  ];                                                                                                                                          Dental: poor dentition[ y ];   Eye : blurred vision [  ]; diplopia [   ]; vision changes [  ];  Amaurosis fugax[  ]; Resp: cough [  ];  wheezing[ y ];  hemoptysis[  ]; shortness of breath[y  ]; paroxysmal nocturnal dyspnea[  ]; dyspnea on exertion[y  ]; or orthopnea[  ];  GI:  gallstones[  ], vomiting[  ];  dysphagia[  ]; melena[  ];  hematochezia [  ]; heartburn[  ];   GU: kidney  stones [  ]; hematuria[  ];   dysuria [  ];  nocturia[  ];  history of     obstruction [  ];                 Skin: rash, swelling[  ];, hair loss[  ];  peripheral edema[  ];  or itching[  ]; Musculosketetal: myalgias[  ];  joint swelling[  ];  joint erythema[  ];  joint pain[ y ];  back pain[y  ];  Heme/Lymph: bruising[  ];  bleeding[  ];  anemia[  ];  Neuro: TIA[  ];  headaches[  ];  stroke[  ];  vertigo[  ];  seizures[  ];   paresthesias[  ];  difficulty walking[y  ];  Psych:depression[ y ]; anxiety[  ];  Endocrine: diabetes[y  ];  thyroid dysfunction[  ];  Other:  Past Medical History  Diagnosis Date  . RENAL INSUFFICIENCY 10/15/2008  . Edema 06/18/2008  . PNEUMONIA 12/01/2007  . CARPAL TUNNEL SYNDROME, BILATERAL 10/30/2007  . HAND PAIN, LEFT 09/04/2007  . Morbid obesity   . ANXIETY 11/08/2006  . HYPERTENSION 11/08/2006  . DIABETES MELLITUS, TYPE II 11/08/2006  . HYPERCHOLESTEROLEMIA 07/24/2009  . ANEMIA 07/24/2009  . COPD 11/08/2006    -HFA 50% p coaching 10/15/2009  . RESPIRATORY FAILURE, CHRONIC 10/15/2009  . BACK PAIN, LUMBAR 03/07/2009  . CHEST PAIN 07/24/2009  . Restless leg syndrome     Medications Prior to Admission  Medication Dose Route Frequency Provider Last Rate Last Dose  . acetaminophen (TYLENOL) tablet 650 mg  650 mg Oral Q6H PRN Eduard Clos, MD       Or  . acetaminophen (TYLENOL) suppository 650 mg  650 mg Rectal Q6H PRN Eduard Clos, MD      . acetaminophen (TYLENOL) tablet 975 mg  975 mg Oral Once Gerhard Munch, MD   975 mg at 03/23/11 0316  . albuterol (PROVENTIL) (5 MG/ML) 0.5% nebulizer solution 2.5 mg  2.5 mg Nebulization Q6H Eduard Clos, MD   2.5 mg at 03/24/11 0828  . albuterol (PROVENTIL) (5 MG/ML) 0.5% nebulizer solution 2.5 mg  2.5 mg Nebulization Q2H PRN Eduard Clos, MD      . albuterol (PROVENTIL) (5 MG/ML) 0.5% nebulizer solution 5 mg  5 mg Nebulization Once Gerhard Munch, MD   5 mg at 03/23/11 0333  . ALPRAZolam  Prudy Feeler) tablet 0.25 mg  0.25 mg Oral TID PRN Eduard Clos, MD   0.25 mg at 03/24/11 1205  . antiseptic oral rinse (BIOTENE) solution 15 mL  15 mL Mouth Rinse BID Dorothea Ogle, MD   15 mL at 03/24/11 1040  . aspirin EC tablet 325 mg  325 mg Oral Daily Eduard Clos, MD   325 mg at 03/24/11 1037  . azithromycin (ZITHROMAX) 500 mg in dextrose 5 % 250 mL IVPB  500 mg Intravenous Once Gerhard Munch, MD   500 mg at 03/23/11 0458  . benzonatate (TESSALON) capsule 200 mg  200 mg Oral TID Rodolph Bong, MD   200 mg at 03/24/11 1146  . budesonide (PULMICORT) nebulizer solution 0.5 mg  0.5 mg Nebulization BID Eduard Clos, MD   0.5 mg at 03/24/11 0831  . calcitRIOL (ROCALTROL) capsule 0.25 mcg  0.25 mcg Oral Daily Eduard Clos, MD   0.25 mcg at 03/24/11 1037  . citalopram (CELEXA) tablet 40 mg  40 mg Oral Daily Eduard Clos, MD   40 mg at 03/24/11 1037  . cloNIDine (CATAPRES) tablet 0.1 mg  0.1 mg Oral TID Eduard Clos, MD   0.1 mg at 03/24/11 1037  . furosemide (LASIX) injection 40 mg  40 mg Intravenous QAC breakfast Eduard Clos, MD   40 mg at 03/24/11 0824  . furosemide (LASIX) injection 40 mg  40 mg Intravenous Once Gerhard Munch, MD   40 mg at 03/23/11 0725  . insulin aspart (novoLOG) injection 0-9 Units  0-9 Units  Subcutaneous TID WC Eduard Clos, MD   1 Units at 03/24/11 0911  . insulin aspart protamine-insulin aspart (NOVOLOG 70/30) injection 35 Units  35 Units Subcutaneous BID WC Eduard Clos, MD   35 Units at 03/24/11 0913  . ipratropium (ATROVENT) nebulizer solution 0.5 mg  0.5 mg Nebulization Q6H Eduard Clos, MD   0.5 mg at 03/24/11 0828  . methylPREDNISolone sodium succinate (SOLU-MEDROL) 125 MG injection 60 mg  60 mg Intravenous Q24H Rodolph Bong, MD   40 mg at 03/24/11 1133  . moxifloxacin (AVELOX) IVPB 400 mg  400 mg Intravenous Q24H Eduard Clos, MD   400 mg at 03/24/11 1133  . mulitivitamin with minerals  tablet 1 tablet  1 tablet Oral Daily Dorothea Ogle, MD   1 tablet at 03/24/11 1037  . ondansetron (ZOFRAN) tablet 4 mg  4 mg Oral Q6H PRN Eduard Clos, MD       Or  . ondansetron [facility]) injection 4 mg  4 mg Intravenous Q6H PRN Eduard Clos, MD      . predniSONE (DELTASONE) tablet 60 mg  60 mg Oral Once Gerhard Munch, MD   60 mg at 03/23/11 0453  . sodium chloride 0.9 % injection 3 mL  3 mL Intravenous Q12H Eduard Clos, MD   3 mL at 03/24/11 0828  . DISCONTD: albuterol (PROVENTIL) (5 MG/ML) 0.5% nebulizer solution 2.5 mg  2.5 mg Nebulization Q4H PRN Gerhard Munch, MD      . DISCONTD: multivitamin tablet 1 tablet  1 tablet Oral Daily Eduard Clos, MD      . DISCONTD: predniSONE (DELTASONE) tablet 40 mg  40 mg Oral Q breakfast Dorothea Ogle, MD      . DISCONTD: sodium chloride 0.9 % injection 3 mL  3 mL Intravenous Q12H Eduard Clos, MD       Medications Prior to Admission  Medication Sig Dispense Refill  . albuterol (PROVENTIL) (2.5 MG/3ML) 0.083% nebulizer solution USE ONE VIAL IN NEBULIZER 4 TIMES DAILY OR EVERY 6 HOURS AS NEEDED  75 mL  4  . ALPRAZolam (XANAX) 0.25 MG tablet TAKE ONE TABLET BY MOUTH TWICE DAILY AS NEEDED FOR ANXIETY. NOT  TO  EXCEED  2  PER  DAY  60 tablet  2  . aspirin 81 MG tablet Take 81 mg by mouth daily.       . calcitRIOL (ROCALTROL) 0.25 MCG capsule Take 0.25 mcg by mouth daily.        . citalopram (CELEXA) 40 MG tablet Take 40 mg by mouth daily.        . cloNIDine (CATAPRES) 0.1 MG tablet Take 0.1 mg by mouth 3 (three) times daily.       . furosemide (LASIX) 40 MG tablet Take 40 mg by mouth daily.        . insulin aspart protamine-insulin aspart (NOVOLOG 70/30) (70-30) 100 UNIT/ML injection Inject 35 Units into the skin 2 (two) times daily with a meal.  10 mL  1  . Multiple Vitamin (MULTIVITAMIN) tablet Take 1 tablet by mouth daily.       Marland Kitchen OVER THE COUNTER MEDICATION Take 1 tablet by mouth daily. Iron tablet.      . SYMBICORT  160-4.5 MCG/ACT inhaler INHALE TWO PUFFS BY MOUTH TWICE DAILY  11 g  3  . NON FORMULARY Oxygen 4LPM  24/7       . ULTICARE INSULIN SYRINGE 31G X 5/16" 1 ML MISC USE  ONE TWICE DAILY  100 each  3        . albuterol  2.5 mg Nebulization Q6H  . antiseptic oral rinse  15 mL Mouth Rinse BID  . aspirin EC  325 mg Oral Daily  . benzonatate  200 mg Oral TID  . budesonide  0.5 mg Nebulization BID  . calcitRIOL  0.25 mcg Oral Daily  . citalopram  40 mg Oral Daily  . cloNIDine  0.1 mg Oral TID  . furosemide  40 mg Intravenous QAC breakfast  . insulin aspart  0-9 Units Subcutaneous TID WC  . insulin aspart protamine-insulin aspart  35 Units Subcutaneous BID WC  . ipratropium  0.5 mg Nebulization Q6H  . methylPREDNISolone (SOLU-MEDROL) injection  60 mg Intravenous Q24H  . moxifloxacin  400 mg Intravenous Q24H  . mulitivitamin with minerals  1 tablet Oral Daily  . sodium chloride  3 mL Intravenous Q12H  . DISCONTD: predniSONE  40 mg Oral Q breakfast    Infusions:    Allergies  Allergen Reactions  . Pioglitazone     REACTION: edema  . Rosiglitazone Maleate Nausea And Vomiting    History   Social History  . Marital Status: Widowed    Spouse Name: N/A    Number of Children: 2  . Years of Education: N/A   Occupational History  . 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 Smoking status: Former Smoker -- 2.0 packs/day for 40 years    Quit date: 01/12/2008  . Smokeless tobacco: Not on file  . Alcohol Use: No  . Drug Use: No  . Sexually Active: Not on file   Other Topics Concern  . Not on file   Social History Narrative   Divorced. Lives with dtr.    Family History  Problem Relation Age of Onset  . Heart disease Mother   . Pancreatic cancer Mother   . Stroke Mother   . Asthma Paternal Grandfather   . Liver cancer Father   . Diabetes Father     PHYSICAL EXAM: Filed Vitals:   03/24/11 0449  BP: 126/65  Pulse: 88  Temp: 97.6 F (36.4  C)  Resp: 22     Intake/Output Summary (Last 24 hours) at 03/24/11 1321 Last data filed at 03/24/11 0900  Gross per 24 hour  Intake    604 ml  Output      3 ml  Net    601 ml    General:  Obese. Sitting in chair. Mildly tachypneic HEENT: normal poor dentition Neck: supple. Hard to see JVP due to size Carotids 2+ bilat; no bruits. No lymphadenopathy or thryomegaly appreciated. Cor: PMI nonpalpable. Distant HS Regular rate & rhythm. No rubs, gallops or murmurs. Lungs: decreased air movmement. Long exp phase. + mild wheeze Abdomen: obese soft, nontender, nondistended.. No bruits or masses. Good bowel sounds. Extremities: no cyanosis, clubbing, rash, tr edema Neuro: alert & oriented x 3, cranial nerves grossly intact. moves all 4 extremities w/o difficulty. Affect pleasant.  ECG: Sinus tach 107. Low volts No ST-T wave abnormalities.    Results for orders placed during the hospital encounter of 03/23/11 (from the past 24 hour(s))  GLUCOSE, CAPILLARY     Status: Abnormal   Collection Time   03/23/11  4:52 PM      Component Value Range   Glucose-Capillary 157 (*) 70 - 99 (mg/dL)  CARDIAC PANEL(CRET KIN+CKTOT+MB+TROPI)     Status: Abnormal  Collection Time   03/23/11  5:13 PM      Component Value Range   Total CK 223 (*) 7 - 177 (U/L)   CK, MB 6.7 (*) 0.3 - 4.0 (ng/mL)   Troponin I <0.30  <0.30 (ng/mL)   Relative Index 3.0 (*) 0.0 - 2.5   GLUCOSE, CAPILLARY     Status: Abnormal   Collection Time   03/23/11  9:48 PM      Component Value Range   Glucose-Capillary 105 (*) 70 - 99 (mg/dL)   Comment 1 Notify RN     Comment 2 Documented in Chart    CARDIAC PANEL(CRET KIN+CKTOT+MB+TROPI)     Status: Abnormal   Collection Time   03/24/11  1:28 AM      Component Value Range   Total CK 226 (*) 7 - 177 (U/L)   CK, MB 7.4 (*) 0.3 - 4.0 (ng/mL)   Troponin I <0.30  <0.30 (ng/mL)   Relative Index 3.3 (*) 0.0 - 2.5   CBC     Status: Abnormal   Collection Time   03/24/11  1:28 AM       Component Value Range   WBC 9.9  4.0 - 10.5 (K/uL)   RBC 3.24 (*) 3.87 - 5.11 (MIL/uL)   Hemoglobin 9.0 (*) 12.0 - 15.0 (g/dL)   HCT 16.1 (*) 09.6 - 46.0 (%)   MCV 88.6  78.0 - 100.0 (fL)   MCH 27.8  26.0 - 34.0 (pg)   MCHC 31.4  30.0 - 36.0 (g/dL)   RDW 04.5  40.9 - 81.1 (%)   Platelets 243  150 - 400 (K/uL)  BASIC METABOLIC PANEL     Status: Abnormal   Collection Time   03/24/11  1:28 AM      Component Value Range   Sodium 136  135 - 145 (mEq/L)   Potassium 4.4  3.5 - 5.1 (mEq/L)   Chloride 94 (*) 96 - 112 (mEq/L)   CO2 36 (*) 19 - 32 (mEq/L)   Glucose, Bld 179 (*) 70 - 99 (mg/dL)   BUN 38 (*) 6 - 23 (mg/dL)   Creatinine, Ser 9.14 (*) 0.50 - 1.10 (mg/dL)   Calcium 9.1  8.4 - 78.2 (mg/dL)   GFR calc non Af Amer 37 (*) >90 (mL/min)   GFR calc Af Amer 43 (*) >90 (mL/min)  GLUCOSE, CAPILLARY     Status: Abnormal   Collection Time   03/24/11  7:56 AM      Component Value Range   Glucose-Capillary 128 (*) 70 - 99 (mg/dL)   Comment 1 Notify RN     Comment 2 Documented in Chart    PRO B NATRIURETIC PEPTIDE     Status: Abnormal   Collection Time   03/24/11  9:50 AM      Component Value Range   Pro B Natriuretic peptide (BNP) 6861.0 (*) 0 - 125 (pg/mL)  GLUCOSE, CAPILLARY     Status: Abnormal   Collection Time   03/24/11 11:29 AM      Component Value Range   Glucose-Capillary 57 (*) 70 - 99 (mg/dL)   Comment 1 Notify RN     Comment 2 Documented in Chart    GLUCOSE, CAPILLARY     Status: Normal   Collection Time   03/24/11 11:57 AM      Component Value Range   Glucose-Capillary 83  70 - 99 (mg/dL)   Dg Chest 2 View  9/56/2130  *RADIOLOGY REPORT*  Clinical Data:  Shortness of breath, cough, hypertension.  CHEST - 2 VIEW  Comparison: 11/25/2010  Findings: Cardiomegaly.  Central vascular congestion.  Mild interstitial prominence and lung base opacity. Hyperinflation with increased AP diameter.  A couple sub centimeter nodules within the lung apices. No pneumothorax.  No pleural  effusion.  No acute osseous abnormality.  IMPRESSION: Cardiomegaly with central vascular congestion.  Mild interstitial prominence may reflect edema.  Mild bibasilar opacities, likely atelectasis.  There are a few small nodular opacities within the upper lungs, nonspecific. A prior CT favored nodular scarring.  Continued attention recommended or follow-up.  Original Report Authenticated By: Waneta Martins, M.D.     ASSESSMENT: 1. A/c respiratory failure 2. A/c systolic HF     --EF ? 40% on echo 9/12 (poor images) 3. Chest pain, pleuritic and exertional 4. COPD on home O2 5. Morbid obesity 6. DM2 7. HL  8. HTN  PLAN/DISCUSSION:  Suspect respiratory failure mostly due to COPD flare but also seems to have HF component. In reviewing her echo she appears to have EF 40% but images very sub-optimal. Given abnormal echo, CP/SOB and extensive risk factors, I think she needs further risk stratification. We discussed cath vs stress test. At this point, she is not interested in cath so we will proceed with nuclear stress testing in a couple of days when respiratory status improves. If respiratory status improves enough will do Lexiscan if not plan dobutamine - hopefully images will be satisfactory. She is not a candidate for CABG, so would only cath if nuke study shows high-risk focal reversible defect.  Would continue diuresis for another day as renal function tolerates. Would wean clonidine and start ARB. Not candidate for b-blocker currently.   We will follow.  Time spent reviewing record, studies and interviewing and examining patient is 60 minutes.   Jayanna Kroeger,MD 1:50 PM

## 2011-03-24 NOTE — Progress Notes (Signed)
Utilization review completed. [name] 03/24/2011 

## 2011-03-24 NOTE — Progress Notes (Signed)
  Echocardiogram 2D Echocardiogram has been performed.  [name] 03/24/2011, 5:45 PM

## 2011-03-25 ENCOUNTER — Inpatient Hospital Stay (HOSPITAL_COMMUNITY): Payer: Medicare Other

## 2011-03-25 ENCOUNTER — Encounter (HOSPITAL_COMMUNITY): Payer: Self-pay | Admitting: Internal Medicine

## 2011-03-25 LAB — CBC
HCT: 27.8 % — ABNORMAL LOW (ref 36.0–46.0)
Hemoglobin: 8.7 g/dL — ABNORMAL LOW (ref 12.0–15.0)
RBC: 3.16 MIL/uL — ABNORMAL LOW (ref 3.87–5.11)
WBC: 9.6 10*3/uL (ref 4.0–10.5)

## 2011-03-25 LAB — GLUCOSE, CAPILLARY: Glucose-Capillary: 219 mg/dL — ABNORMAL HIGH (ref 70–99)

## 2011-03-25 LAB — BASIC METABOLIC PANEL
BUN: 40 mg/dL — ABNORMAL HIGH (ref 6–23)
Chloride: 95 mEq/L — ABNORMAL LOW (ref 96–112)
GFR calc Af Amer: 44 mL/min — ABNORMAL LOW (ref >90)
GFR calc non Af Amer: 38 mL/min — ABNORMAL LOW (ref >90)
Potassium: 4.6 mEq/L (ref 3.5–5.1)
Sodium: 137 mEq/L (ref 135–145)

## 2011-03-25 LAB — LIPID PANEL
Cholesterol: 106 mg/dL (ref 0–200)
HDL: 34 mg/dL — ABNORMAL LOW (ref >39)
Total CHOL/HDL Ratio: 3.1 RATIO
Triglycerides: 104 mg/dL (ref <150)

## 2011-03-25 MED ORDER — LOSARTAN POTASSIUM 50 MG PO TABS
50.0000 mg | ORAL_TABLET | Freq: Every day | ORAL | Status: DC
  Administered 2011-03-25 – 2011-04-01 (×8): 50 mg via ORAL
  Filled 2011-03-25 (×8): qty 1

## 2011-03-25 MED ORDER — LEVALBUTEROL HCL 0.63 MG/3ML IN NEBU
0.6300 mg | INHALATION_SOLUTION | Freq: Four times a day (QID) | RESPIRATORY_TRACT | Status: DC
  Administered 2011-03-25 – 2011-03-31 (×18): 0.63 mg via RESPIRATORY_TRACT
  Filled 2011-03-25 (×29): qty 3

## 2011-03-25 MED ORDER — FUROSEMIDE 40 MG PO TABS
40.0000 mg | ORAL_TABLET | Freq: Two times a day (BID) | ORAL | Status: DC
  Filled 2011-03-25 (×3): qty 1

## 2011-03-25 MED ORDER — FUROSEMIDE 40 MG PO TABS
40.0000 mg | ORAL_TABLET | Freq: Two times a day (BID) | ORAL | Status: DC
  Administered 2011-03-25 – 2011-03-26 (×2): 40 mg via ORAL
  Filled 2011-03-25 (×4): qty 1

## 2011-03-25 MED ORDER — MUPIROCIN CALCIUM 2 % EX CREA
TOPICAL_CREAM | Freq: Every day | CUTANEOUS | Status: DC
  Administered 2011-03-25 – 2011-04-01 (×8): via TOPICAL
  Filled 2011-03-25: qty 15

## 2011-03-25 MED ORDER — TECHNETIUM TC 99M TETROFOSMIN IV KIT
30.0000 | PACK | Freq: Once | INTRAVENOUS | Status: AC | PRN
  Administered 2011-03-25: 30 via INTRAVENOUS

## 2011-03-25 NOTE — Progress Notes (Addendum)
Subjective:   Breathing much improved this am. No further wheezing. Echo repeated last night - I haven't had chance to review yet. Continues to diurese. No CP.    Intake/Output Summary (Last 24 hours) at 03/25/11 0838 Last data filed at 03/25/11 0550  Gross per 24 hour  Intake   1464 ml  Output   2355 ml  Net   -891 ml    Current meds:    . albuterol  2.5 mg Nebulization Q6H  . antiseptic oral rinse  15 mL Mouth Rinse BID  . aspirin EC  325 mg Oral Daily  . benzonatate  200 mg Oral TID  . budesonide  0.5 mg Nebulization BID  . calcitRIOL  0.25 mcg Oral Daily  . citalopram  40 mg Oral Daily  . cloNIDine  0.1 mg Oral BID  . furosemide  40 mg Intravenous BID  . insulin aspart  0-9 Units Subcutaneous TID WC  . insulin aspart protamine-insulin aspart  35 Units Subcutaneous BID WC  . ipratropium  0.5 mg Nebulization Q6H  . losartan  25 mg Oral Daily  . methylPREDNISolone (SOLU-MEDROL) injection  60 mg Intravenous Q24H  . moxifloxacin  400 mg Intravenous Q24H  . mulitivitamin with minerals  1 tablet Oral Daily  . sodium chloride  3 mL Intravenous Q12H  . DISCONTD: cloNIDine  0.1 mg Oral TID  . DISCONTD: furosemide  40 mg Intravenous QAC breakfast  . DISCONTD: predniSONE  40 mg Oral Q breakfast   Infusions:     Objective:  Blood pressure 148/52, pulse 93, temperature 97.3 F (36.3 C), temperature source Oral, resp. rate 18, height 5\' 4"  (1.626 m), weight 119.1 kg (262 lb 9.1 oz), SpO2 94.00%. Weight change: -0.1 kg (-3.5 oz)  Physical Exam: General: Obese. Sitting in bed. Breathing improved  HEENT: normal poor dentition  Neck: supple. Hard to see JVP due to size Carotids 2+ bilat; no bruits. No lymphadenopathy or thryomegaly appreciated.  Cor: PMI nonpalpable. Distant HS Regular rate & rhythm. No rubs, gallops or murmurs.  Lungs: decreased air movmement. Long exp phase. No wheeze  Abdomen: obese soft, nontender, nondistended.. No bruits or masses. Good bowel sounds.    Extremities: no cyanosis, clubbing, rash, tr edema  Neuro: alert & oriented x 3, cranial nerves grossly intact. moves all 4 extremities w/o difficulty. Affect pleasant.   Telemetry:  NSR 90s  Lab Results: Basic Metabolic Panel:  Lab 03/25/11 1914 03/24/11 0128 03/23/11 1005 03/23/11 0318  NA 137 136 136 136  K 4.6 4.4 -- --  CL 95* 94* 95* 96  CO2 37* 36* 30 32  GLUCOSE 126* 179* 228* 153*  BUN 40* 38* 28* 26*  CREATININE 1.41* 1.44* 1.28* 1.23*  CALCIUM 9.5 9.1 8.9 8.6  MG -- -- -- --  PHOS -- -- -- --   Liver Function Tests:  Lab 03/23/11 1005 03/23/11 0318  AST 19 17  ALT 18 16  ALKPHOS 57 49  BILITOT 0.3 0.3  PROT 7.5 7.1  ALBUMIN 3.0* 3.0*   No results found for this basename: LIPASE:5,AMYLASE:5 in the last 168 hours No results found for this basename: AMMONIA:5 in the last 168 hours CBC:  Lab 03/25/11 0530 03/24/11 0128 03/23/11 1005 03/23/11 0318  WBC 9.6 9.9 10.2 10.9*  NEUTROABS -- -- -- 8.7*  HGB 8.7* 9.0* 9.4* 9.1*  HCT 27.8* 28.7* 30.0* 28.9*  MCV 88.0 88.6 88.0 87.6  PLT 248 243 226 224   Cardiac Enzymes:  Lab 03/24/11  0128 03/23/11 1713 03/23/11 1005 03/23/11 0620  CKTOTAL 226* 223* 197* --  CKMB 7.4* 6.7* 5.4* --  CKMBINDEX -- -- -- --  TROPONINI <0.30 <0.30 <0.30 <0.30   BNP: No components found with this basename: POCBNP:5 CBG:  Lab 03/25/11 0753 03/24/11 2136 03/24/11 1647 03/24/11 1157 03/24/11 1129  GLUCAP 128* 165* 208* 83 57*   Microbiology: Lab Results  Component Value Date   CULT NO GROWTH 11/21/2010   CULT Multiple bacterial morphotypes present, none predominant. Suggest appropriate recollection if clinically indicated. 09/30/2010   CULT NO GROWTH 5 DAYS 02/13/2009   CULT NO GROWTH 5 DAYS 02/13/2009   CULT  Value: MODERATE HAEMOPHILUS INFLUENZAE Note: BETA LACTAMASE POSITIVE 02/13/2009   No results found for this basename: CULT:2,SDES:2 in the last 168 hours  Imaging: No results found.   ASSESSMENT:  1. A/c respiratory  failure  2. A/c systolic HF  --EF ? 40% on echo 9/12 (poor images)  3. Chest pain, pleuritic and exertional  4. COPD on home O2  5. Morbid obesity  6. DM2  7. HL  8. HTN  PLAN/DISCUSSION:  Respiratory status much improved with diuresis and treatment of COPD flare. Will review echo. Plan Lexiscan Myoview in am. Will likely need 2 day study. Change lasix back to po. If EF down would wean clonidine to off and increase losartan and add hydralazine/NTG. Not b-blocker candidate at this point due to COPD with frequent flares.   Reuel Boom Taivon Haroon,MD 8:43 AM

## 2011-03-25 NOTE — Progress Notes (Signed)
Subjective: Some improvement with shortness of breath however still short of breath with exertion per patient.  Objective: Vital signs in last 24 hours: Filed Vitals:   03/25/11 0550 03/25/11 1000 03/25/11 1007 03/25/11 1324  BP: 148/52 141/64 141/73 137/75  Pulse: 93 97  90  Temp: 97.3 F (36.3 C) 97.6 F (36.4 C)  97.1 F (36.2 C)  TempSrc: Oral Oral  Oral  Resp: 18 18    Height:      Weight:      SpO2: 94% 97%  98%    Intake/Output Summary (Last 24 hours) at 03/25/11 1401 Last data filed at 03/25/11 1325  Gross per 24 hour  Intake    840 ml  Output   3204 ml  Net  -2364 ml    Weight change: -0.1 kg (-3.5 oz)  General: Alert, awake, oriented x3, in no acute distress. Heart: Regular rate and rhythm, without murmurs, rubs, gallops. Lungs: Tight. No wheezes. Abdomen: Soft, nontender, nondistended, positive bowel sounds. Extremities: No clubbing cyanosis or edema with positive pedal pulses.    Lab Results:  [facility] 03/25/11 0530 03/24/11 0128  NA 137 136  K 4.6 4.4  CL 95* 94*  CO2 37* 36*  GLUCOSE 126* 179*  BUN 40* 38*  CREATININE 1.41* 1.44*  CALCIUM 9.5 9.1  MG -- --  PHOS -- --    Basename 03/23/11 1005 03/23/11 0318  AST 19 17  ALT 18 16  ALKPHOS 57 49  BILITOT 0.3 0.3  PROT 7.5 7.1  ALBUMIN 3.0* 3.0*   No results found for this basename: LIPASE:2,AMYLASE:2 in the last 72 hours  Basename 03/25/11 0530 03/24/11 0128 03/23/11 0318  WBC 9.6 9.9 --  NEUTROABS -- -- 8.7*  HGB 8.7* 9.0* --  HCT 27.8* 28.7* --  MCV 88.0 88.6 --  PLT 248 243 --    Basename 03/24/11 0128 03/23/11 1713 03/23/11 1005  CKTOTAL 226* 223* 197*  CKMB 7.4* 6.7* 5.4*  CKMBINDEX -- -- --  TROPONINI <0.30 <0.30 <0.30   No components found with this basename: POCBNP:3 No results found for this basename: DDIMER:2 in the last 72 hours  Basename 03/23/11 1005  HGBA1C 7.1*    Basename 03/25/11 0530  CHOL 106  HDL 34*  LDLCALC 51  TRIG 696  CHOLHDL 3.1  LDLDIRECT  --    Basename 03/23/11 1005  TSH 0.456  T4TOTAL --  T3FREE --  THYROIDAB --    Basename 03/24/11 0950  VITAMINB12 669  FOLATE >20.0  FERRITIN 95  TIBC 265  IRON 29*  RETICCTPCT --    Micro Results: No results found for this or any previous visit (from the past 240 hour(s)).  Studies/Results: No results found.  Medications:     . antiseptic oral rinse  15 mL Mouth Rinse BID  . aspirin EC  325 mg Oral Daily  . benzonatate  200 mg Oral TID  . budesonide  0.5 mg Nebulization BID  . calcitRIOL  0.25 mcg Oral Daily  . citalopram  40 mg Oral Daily  . cloNIDine  0.1 mg Oral BID  . furosemide  40 mg Intravenous BID  . furosemide  40 mg Oral BID  . insulin aspart  0-9 Units Subcutaneous TID WC  . insulin aspart protamine-insulin aspart  35 Units Subcutaneous BID WC  . ipratropium  0.5 mg Nebulization Q6H  . levalbuterol  0.63 mg Nebulization Q6H  . losartan  50 mg Oral Daily  . methylPREDNISolone (SOLU-MEDROL) injection  60  mg Intravenous Q24H  . moxifloxacin  400 mg Intravenous Q24H  . mulitivitamin with minerals  1 tablet Oral Daily  . sodium chloride  3 mL Intravenous Q12H  . DISCONTD: albuterol  2.5 mg Nebulization Q6H  . DISCONTD: furosemide  40 mg Oral BID  . DISCONTD: losartan  25 mg Oral Daily    Assessment: Principal Problem:  *SOB (shortness of breath) Active Problems:  DIABETES MELLITUS, TYPE II  HYPERTENSION  COPD  Systolic CHF, acute  Acute and chronic respiratory failure (acute-on-chronic)  Acute on chronic systolic heart failure  Other chest pain  Chest pain   Plan: #1 acute on chronic respiratory failure Multifactorial in nature secondary to acute COPD exacerbation and acute systolic heart failure. Patient is slowly clinically improving. Cardiac enzymes which was cycled were negative x3. 2-D echo with EF of 30-35%. Patient had 1/ 2+ of Myoview stress test done today. Clonidine been weaned off per cardiology. Continue aspirin, Lasix changed to  oral, Cozaar started per cardiology. Continue oxygen, nebs, IV Solu-Medrol, Avelox. Follow.  #2 acute on chronic systolic heart failure Cardiac enzymes negative x3. 2-D echo with EF of 30-35% . 1/2 Myoview stress test done today. Clonidine been weaned off. Patient started on Cozaar. Lasix changed to oral. Continue aspirin. Cardiology following and appreciate input and recommendations.  #3 diabetes mellitus type 2 Hemoglobin A1c of 7.1. CBGs ranged from 96 through 165. NPH. Continue sliding scale insulin.  #4 acute COPD exacerbation Continue oxygen, nebs, Avelox, IV Solu-Medrol.  #5 chronic renal insufficiency Stable.  #6 hypertension Stable  #7 anemia H&H stable.  #8 prophylaxis SCDs for DVT prophylaxis.  LOS: 2 days   [facility] 03/25/2011, 2:01 PM

## 2011-03-25 NOTE — Progress Notes (Signed)
PT Cancellation Note  Orders for PT eval and treat received. PT eval attempted this afternoon, however treatment cancelled today due to patient feeling fatigued and wanting to eat dinner. PT will follow-up tomorrow.  Thanks!  Romeo Rabon 03/25/2011, 5:23 PM

## 2011-03-26 ENCOUNTER — Inpatient Hospital Stay (HOSPITAL_COMMUNITY): Payer: Medicare Other

## 2011-03-26 ENCOUNTER — Encounter (HOSPITAL_COMMUNITY): Payer: Self-pay | Admitting: Internal Medicine

## 2011-03-26 DIAGNOSIS — J9620 Acute and chronic respiratory failure, unspecified whether with hypoxia or hypercapnia: Secondary | ICD-10-CM

## 2011-03-26 DIAGNOSIS — I255 Ischemic cardiomyopathy: Secondary | ICD-10-CM

## 2011-03-26 DIAGNOSIS — I5023 Acute on chronic systolic (congestive) heart failure: Secondary | ICD-10-CM

## 2011-03-26 DIAGNOSIS — R0602 Shortness of breath: Secondary | ICD-10-CM

## 2011-03-26 HISTORY — DX: Ischemic cardiomyopathy: I25.5

## 2011-03-26 LAB — GLUCOSE, CAPILLARY
Glucose-Capillary: 126 mg/dL — ABNORMAL HIGH (ref 70–99)
Glucose-Capillary: 130 mg/dL — ABNORMAL HIGH (ref 70–99)

## 2011-03-26 LAB — BASIC METABOLIC PANEL
Calcium: 9.7 mg/dL (ref 8.4–10.5)
GFR calc Af Amer: 42 mL/min — ABNORMAL LOW (ref >90)
GFR calc non Af Amer: 37 mL/min — ABNORMAL LOW (ref >90)
Potassium: 4.6 mEq/L (ref 3.5–5.1)
Sodium: 139 mEq/L (ref 135–145)

## 2011-03-26 LAB — CBC
Hemoglobin: 9.2 g/dL — ABNORMAL LOW (ref 12.0–15.0)
Platelets: 270 10*3/uL (ref 150–400)
RBC: 3.38 MIL/uL — ABNORMAL LOW (ref 3.87–5.11)
WBC: 10.2 10*3/uL (ref 4.0–10.5)

## 2011-03-26 MED ORDER — TECHNETIUM TC 99M TETROFOSMIN IV KIT
30.0000 | PACK | Freq: Once | INTRAVENOUS | Status: AC | PRN
  Administered 2011-03-25: 30 via INTRAVENOUS

## 2011-03-26 MED ORDER — TECHNETIUM TC 99M TETROFOSMIN IV KIT
30.0000 | PACK | Freq: Once | INTRAVENOUS | Status: AC | PRN
  Administered 2011-03-26: 30 via INTRAVENOUS

## 2011-03-26 MED ORDER — POTASSIUM CHLORIDE CRYS ER 20 MEQ PO TBCR
40.0000 meq | EXTENDED_RELEASE_TABLET | Freq: Every day | ORAL | Status: DC
  Administered 2011-03-27 – 2011-03-28 (×2): 40 meq via ORAL
  Filled 2011-03-26 (×2): qty 2

## 2011-03-26 MED ORDER — FUROSEMIDE 40 MG PO TABS
40.0000 mg | ORAL_TABLET | Freq: Every day | ORAL | Status: DC
  Administered 2011-03-27 – 2011-03-30 (×4): 40 mg via ORAL
  Filled 2011-03-26 (×5): qty 1

## 2011-03-26 MED ORDER — REGADENOSON 0.4 MG/5ML IV SOLN
0.4000 mg | Freq: Once | INTRAVENOUS | Status: AC
  Administered 2011-03-26: 0.4 mg via INTRAVENOUS

## 2011-03-26 MED ORDER — PREDNISONE 50 MG PO TABS
60.0000 mg | ORAL_TABLET | Freq: Every day | ORAL | Status: DC
  Administered 2011-03-27 – 2011-03-28 (×2): 60 mg via ORAL
  Filled 2011-03-26 (×3): qty 1

## 2011-03-26 MED ORDER — MOXIFLOXACIN HCL 400 MG PO TABS
400.0000 mg | ORAL_TABLET | Freq: Every day | ORAL | Status: DC
  Administered 2011-03-26 – 2011-03-29 (×4): 400 mg via ORAL
  Filled 2011-03-26 (×6): qty 1

## 2011-03-26 MED ORDER — POTASSIUM CHLORIDE CRYS ER 20 MEQ PO TBCR
40.0000 meq | EXTENDED_RELEASE_TABLET | Freq: Two times a day (BID) | ORAL | Status: DC
  Administered 2011-03-26: 40 meq via ORAL
  Filled 2011-03-26: qty 2

## 2011-03-26 NOTE — Progress Notes (Addendum)
Inpatient Diabetes Program Recommendations  AACE/ADA: New Consensus Statement on Inpatient Glycemic Control (2009)  Target Ranges:  Prepandial:   less than 140 mg/dL      Peak postprandial:   less than 180 mg/dL (1-2 hours)      Critically ill patients:  140 - 180 mg/dL   Reason for Visit: Need for carb modified diet as she does at home.  Need for carb modified especially while on high dose steroid therapy.  Inpatient Diabetes Program Recommendations Diet: Please change diet to carbohydrate modified-moderate as pt has diabetes.  The carb modified has provisions for hearth healthy diet, but heart diet does not have provisions for carb modifed, diabetic.  Note: Thank you, Lenor Coffin, RN, CNS, Diabetes Coordinator [phone])

## 2011-03-26 NOTE — Progress Notes (Signed)
PT Cancellation Note  Treatment cancelled today due to patient's refusal to participate.  Attempted x 2 first, just back after stress test and waiting to eat.  Second attempt patient reports not feeling well with HA and SOB.  Will check back at later date.  Thanks  Parker Wherley,CYNDI 03/26/2011, 2:42 PM

## 2011-03-26 NOTE — Progress Notes (Signed)
03/26/2011 [name] SPARKS Case Management Note [phone]  Utilization review completed.  

## 2011-03-26 NOTE — Progress Notes (Signed)
Subjective: Some improvement with shortness of breath. Patient c/o weakness  Objective: Vital signs in last 24 hours: Filed Vitals:   03/26/11 0927 03/26/11 1138 03/26/11 1359 03/26/11 1411  BP: 157/80 143/88 139/60   Pulse: 112 98 106   Temp:  97.4 F (36.3 C) 98.1 F (36.7 C)   TempSrc:  Oral Oral   Resp:  20 21   Height:      Weight:      SpO2: 97% 94% 95% 96%    Intake/Output Summary (Last 24 hours) at 03/26/11 1558 Last data filed at 03/26/11 1527  Gross per 24 hour  Intake    363 ml  Output   4300 ml  Net  -3937 ml    Weight change: -1.2 kg (-2 lb 10.3 oz)  General: Alert, awake, oriented x3, in no acute distress. Heart: Regular rate and rhythm, without murmurs, rubs, gallops. Lungs: Fair air movement. No wheezes. Abdomen: Soft, nontender, nondistended, positive bowel sounds. Extremities: No clubbing cyanosis or edema with positive pedal pulses.    Lab Results:  Basename 03/26/11 0530 03/25/11 0530  NA 139 137  K 4.6 4.6  CL 94* 95*  CO2 38* 37*  GLUCOSE 140* 126*  BUN 45* 40*  CREATININE 1.46* 1.41*  CALCIUM 9.7 9.5  MG -- --  PHOS -- --   No results found for this basename: AST:2,ALT:2,ALKPHOS:2,BILITOT:2,PROT:2,ALBUMIN:2 in the last 72 hours No results found for this basename: LIPASE:2,AMYLASE:2 in the last 72 hours  Basename 03/26/11 0530 03/25/11 0530  WBC 10.2 9.6  NEUTROABS -- --  HGB 9.2* 8.7*  HCT 29.5* 27.8*  MCV 87.3 88.0  PLT 270 248    Basename 03/24/11 0128 03/23/11 1713  CKTOTAL 226* 223*  CKMB 7.4* 6.7*  CKMBINDEX -- --  TROPONINI <0.30 <0.30   No components found with this basename: POCBNP:3 No results found for this basename: DDIMER:2 in the last 72 hours No results found for this basename: HGBA1C:2 in the last 72 hours  Basename 03/25/11 0530  CHOL 106  HDL 34*  LDLCALC 51  TRIG 161  CHOLHDL 3.1  LDLDIRECT --   No results found for this basename: TSH,T4TOTAL,FREET3,T3FREE,THYROIDAB in the last 72  hours  Basename 03/24/11 0950  VITAMINB12 669  FOLATE >20.0  FERRITIN 95  TIBC 265  IRON 29*  RETICCTPCT --    Micro Results: No results found for this or any previous visit (from the past 240 hour(s)).  Studies/Results: Nm Myocar Multi W/spect W/wall Motion / Ef  03/26/2011  *RADIOLOGY REPORT*  Clinical Data:  Respiratory failure.  Morbid obesity.  COPD on home to.  Diabetes hypertension.  No apparent cardiac history.  MYOCARDIAL IMAGING WITH SPECT (REST AND PHARMACOLOGIC-STRESS - 2 DAY PROTOCOL) GATED LEFT VENTRICULAR WALL MOTION STUDY LEFT VENTRICULAR EJECTION FRACTION  Technique:  Standard myocardial SPECT imaging was performed after intravenous injection of 30 mCi Tc-10m tetrofosmin at rest.  On a different day, intravenous infusion of  Lexiscan was performed under supervision of the Cardiology staff.  At peak effect of the drug, 30 mCi Tc-30m tetrofosmin was injected intravenously and standard myocardial SPECT imaging was performed.  Quantitative gated imaging was also performed to evaluate left ventricular wall motion and estimate left ventricular ejection fraction.  Comparison:  None.  Findings: Multiplanar SPECT imaging shows grossly increased left ventricular cavity size.  Two fixed defects are identified, one is in the mid to distal inferolateral wall.  The second is a small defect in the distal anteroseptal wall. No evidence  for inducible perfusion defect within the left ventricular myocardium.  Images obtained with cardiac gating displayed using a surface rendering algorithm show inferior and distal inferolateral hypokinesia.  There is also evidence for a hypokinetic apex.  Left ventricular end-diastolic volume is calculated at 173 ml.  The left ventricular end systolic volume is calculated 562 ml.  The derived left ventricular ejection fraction is 35%.  IMPRESSION: No evidence for inducible ischemia.  Moderate inferolateral and small distal anteroseptal fixed defects are identified and  may be related to scar.  Inferior and mid/distal inferolateral hypokinesia extending into the apex.  Left ventricular ejection fraction of 35%.  Original Report Authenticated By: ERIC A. MANSELL, M.D.    Medications:     . antiseptic oral rinse  15 mL Mouth Rinse BID  . aspirin EC  325 mg Oral Daily  . benzonatate  200 mg Oral TID  . budesonide  0.5 mg Nebulization BID  . calcitRIOL  0.25 mcg Oral Daily  . citalopram  40 mg Oral Daily  . cloNIDine  0.1 mg Oral BID  . furosemide  40 mg Oral Q breakfast  . insulin aspart  0-9 Units Subcutaneous TID WC  . insulin aspart protamine-insulin aspart  35 Units Subcutaneous BID WC  . ipratropium  0.5 mg Nebulization Q6H  . levalbuterol  0.63 mg Nebulization Q6H  . losartan  50 mg Oral Daily  . methylPREDNISolone (SOLU-MEDROL) injection  60 mg Intravenous Q24H  . moxifloxacin  400 mg Oral q1800  . mulitivitamin with minerals  1 tablet Oral Daily  . mupirocin cream   Topical Daily  . potassium chloride  40 mEq Oral Daily  . regadenoson  0.4 mg Intravenous Once  . sodium chloride  3 mL Intravenous Q12H  . DISCONTD: furosemide  40 mg Oral BID  . DISCONTD: moxifloxacin  400 mg Intravenous Q24H  . DISCONTD: potassium chloride  40 mEq Oral BID    Assessment: Principal Problem:  *Acute-on-chronic respiratory failure Active Problems:  DIABETES MELLITUS, TYPE II  HYPERTENSION  COPD  SOB (shortness of breath)  Systolic CHF, acute  Acute on chronic systolic heart failure  Ischemic cardiomyopathy  Acute and chronic respiratory failure (acute-on-chronic)  Other chest pain  Chest pain   Plan: #1 acute on chronic respiratory failure Multifactorial in nature secondary to acute COPD exacerbation and acute systolic heart failure. Patient is slowly clinically improving. Cardiac enzymes which was cycled were negative x3. 2-D echo with EF of 30-35%. Patient had 2/ 2+ of Myoview stress test done today. Clonidine been weaned off per cardiology.  Continue aspirin, Lasix changed to oral, Cozaar started per cardiology. Continue oxygen, nebs, change IV solumedrol to oral prednisone., Avelox. Follow.  #2 acute on chronic systolic heart failure Cardiac enzymes negative x3. 2-D echo with EF of 30-35% . 2/2 Myoview stress test done today. Clonidine been weaned off. Continue Cozaar. Lasix changed to oral and dose decreased. Continue aspirin. Cardiology following and appreciate input and recommendations.  #3 diabetes mellitus type 2 Hemoglobin A1c of 7.1. CBGs ranged from 96 through 165. NPH. Continue sliding scale insulin.  #4 acute COPD exacerbation Continue oxygen, nebs, Avelox, change solumedrol to oral prednisone.  #5 chronic renal insufficiency Stable.  #6 hypertension Stable  #7 anemia H&H stable.  #8 prophylaxis SCDs for DVT prophylaxis.  LOS: 3 days   [facility] 03/26/2011, 3:58 PM

## 2011-03-26 NOTE — Progress Notes (Signed)
OT Cancellation Note  Treatment cancelled today due to patient receiving procedure or test.  Alba Cory 03/26/2011, 10:48 AM

## 2011-03-26 NOTE — Progress Notes (Signed)
Spoke with Dr.Thompson and he said to hold her am dose of 70/30 and just give her evening dose since she was down for her stress test when it was due.

## 2011-03-26 NOTE — Progress Notes (Addendum)
Cardiology Progress Note Patient Name: [name] Date of Encounter: 03/26/2011, 9:18 AM     Subjective  Patient seen briefly in nuclear stress room.   Pre Test: VSS. O2 sats 97% on 4L [place]. EKG shows ST 104bpm, no acute ST/T changes. Pt with sob, somewhat better than yesterday. Denies cp.  During Test: VSS. Sats 99% on 4L [place]. EKG without changes. Pt with increased sob, mild headache. No cp.  Post Test: VSS. Symptoms resolving back to baseline sob. No EKG changes.  Await interpretation of images.    Objective   Telemetry: Unable to view as she was evaluated in nuclear medicine  Medications: . antiseptic oral rinse  15 mL Mouth Rinse BID  . aspirin EC  325 mg Oral Daily  . benzonatate  200 mg Oral TID  . budesonide  0.5 mg Nebulization BID  . calcitRIOL  0.25 mcg Oral Daily  . citalopram  40 mg Oral Daily  . cloNIDine  0.1 mg Oral BID  . furosemide  40 mg Oral BID  . insulin aspart  0-9 Units Subcutaneous TID WC  . insulin aspart protamine-insulin aspart  35 Units Subcutaneous BID WC  . ipratropium  0.5 mg Nebulization Q6H  . levalbuterol  0.63 mg Nebulization Q6H  . losartan  50 mg Oral Daily  . methylPREDNISolone (SOLU-MEDROL) injection  60 mg Intravenous Q24H  . moxifloxacin  400 mg Intravenous Q24H  . mulitivitamin with minerals  1 tablet Oral Daily  . mupirocin cream   Topical Daily  . regadenoson  0.4 mg Intravenous Once  . sodium chloride  3 mL Intravenous Q12H  . DISCONTD: albuterol  2.5 mg Nebulization Q6H  . DISCONTD: furosemide  40 mg Oral BID    Physical Exam: Temp:  [97.1 F (36.2 C)-98.3 F (36.8 C)] 98.1 F (36.7 C) (03/15 0908) Pulse Rate:  [90-111] 109  (03/15 0910) Resp:  [18-24] 24  (03/15 0910) BP: (131-156)/(50-88) 150/82 mmHg (03/15 0910) SpO2:  [95 %-98 %] 97 % (03/15 0910) Weight:  [259 lb 14.8 oz (117.9 kg)] 259 lb 14.8 oz (117.9 kg) (03/14 2120)  General: Overweight elderly white female, in no acute distress. Head:  Normocephalic, atraumatic, sclera non-icteric, nares are without discharge.  Neck: Supple. No JVD Lungs: Diminished throughout with poor air movement, no wheezes, rhonchi or rales appreciated. Breathing is unlabored. Heart: Distant heart sounds. RRR S1 S2 without murmurs, rubs, or gallops.  Abdomen: Soft, non-tender, non-distended with normoactive bowel sounds. No rebound/guarding. No obvious abdominal masses. Msk:  Strength and tone appear normal for age. Extremities: Trace BLE edema. No clubbing or cyanosis. Distal pedal pulses are intact and equal bilaterally. Neuro: Alert and oriented X 3. Moves all extremities spontaneously. Psych:  Responds to questions appropriately with a normal affect.   Intake/Output Summary (Last 24 hours) at 03/26/11 0918 Last data filed at 03/26/11 0900  Gross per 24 hour  Intake    600 ml  Output   3551 ml  Net  -2951 ml    Labs:  [facility] 03/26/11 0530 03/25/11 0530  NA 139 137  K 4.6 4.6  CL 94* 95*  CO2 38* 37*  GLUCOSE 140* 126*  BUN 45* 40*  CREATININE 1.46* 1.41*  CALCIUM 9.7 9.5   Basename 03/23/11 1005  AST 19  ALT 18  ALKPHOS 57  BILITOT 0.3  PROT 7.5  ALBUMIN 3.0*   Basename 03/26/11 0530 03/25/11 0530  WBC 10.2 9.6  HGB 9.2* 8.7*  HCT 29.5* 27.8*  MCV 87.3 88.0  PLT 270 248   Basename 03/24/11 0128 03/23/11 1713 03/23/11 1005  CKTOTAL 226* 223* 197*  CKMB 7.4* 6.7* 5.4*  TROPONINI <0.30 <0.30 <0.30   Basename 03/23/11 1005  HGBA1C 7.1*   Basename 03/25/11 0530  CHOL 106  HDL 34*  LDLCALC 51  TRIG 161  CHOLHDL 3.1   Basename 03/23/11 1005  TSH 0.456   Basename 03/24/11 0950  VITAMINB12 669  FOLATE >20.0  FERRITIN 95  TIBC 265  IRON 29*   Radiology/Studies:   03/24/11 - 2D Echocardiogram Study Conclusions: - Left ventricle: There was mild concentric hypertrophy. Systolic function was moderately to severely reduced. The estimated ejection fraction was in the range of 30% to 35%.  - Mitral valve: Mild  regurgitation. - Left atrium: The atrium was mildly dilated. Impressions: Extremely limited due to poor sound wave transmission; LV function appears to be significantly reduced; suggest TEE, MUGA or cardiac MRI to further evaluate  03/23/2011 - CXR Findings: Cardiomegaly.  Central vascular congestion.  Mild interstitial prominence and lung base opacity. Hyperinflation with increased AP diameter.  A couple sub centimeter nodules within the lung apices. No pneumothorax.  No pleural effusion.  No acute osseous abnormality.  IMPRESSION: Cardiomegaly with central vascular congestion.  Mild interstitial prominence may reflect edema.  Mild bibasilar opacities, likely atelectasis.  There are a few small nodular opacities within the upper lungs, nonspecific. A prior CT favored nodular scarring.  Continued attention recommended or follow-up.  Marland Kitchen  MYOVIEW No evidence for inducible ischemia. Moderate inferolateral and  small distal anteroseptal fixed defects are identified and may be  related to scar.  Inferior and mid/distal inferolateral hypokinesia extending into  the apex.  Left ventricular ejection fraction of 35%.    Assessment and Plan    A/C respiratory failure A/C systolic HF  COPD  Home Obesity DM Ischemic CM  No significant ischemia so med therapy   Back down on diruteics as bun/cr are increasing   Signed, HOPE, JESSICA PA-C

## 2011-03-27 LAB — GLUCOSE, CAPILLARY
Glucose-Capillary: 130 mg/dL — ABNORMAL HIGH (ref 70–99)
Glucose-Capillary: 247 mg/dL — ABNORMAL HIGH (ref 70–99)

## 2011-03-27 LAB — BASIC METABOLIC PANEL
BUN: 41 mg/dL — ABNORMAL HIGH (ref 6–23)
Calcium: 9.9 mg/dL (ref 8.4–10.5)
Creatinine, Ser: 1.23 mg/dL — ABNORMAL HIGH (ref 0.50–1.10)
GFR calc Af Amer: 52 mL/min — ABNORMAL LOW (ref >90)

## 2011-03-27 LAB — CBC
HCT: 33.1 % — ABNORMAL LOW (ref 36.0–46.0)
MCHC: 31.1 g/dL (ref 30.0–36.0)
MCV: 88 fL (ref 78.0–100.0)
Platelets: 319 10*3/uL (ref 150–400)
RDW: 14.5 % (ref 11.5–15.5)
WBC: 12.8 10*3/uL — ABNORMAL HIGH (ref 4.0–10.5)

## 2011-03-27 NOTE — Progress Notes (Signed)
Subjective: Patient states shortness of breath improved. Patient still with SOB on exertion.  Objective: Vital signs in last 24 hours: Filed Vitals:   03/27/11 0513 03/27/11 1012 03/27/11 1019 03/27/11 1020  BP: 156/69 157/69    Pulse: 99 99 109   Temp: 97.8 F (36.6 C)     TempSrc: Oral     Resp: 20     Height:      Weight:      SpO2: 93% 88% 86% 90%    Intake/Output Summary (Last 24 hours) at 03/27/11 1200 Last data filed at 03/26/11 1809  Gross per 24 hour  Intake    600 ml  Output   2150 ml  Net  -1550 ml    Weight change: -0.5 kg (-1 lb 1.6 oz)  General: Alert, awake, oriented x3, in no acute distress. Heart: Regular rate and rhythm, without murmurs, rubs, gallops. Lungs: Fair air movement. No wheezes. Abdomen: Soft, nontender, nondistended, positive bowel sounds. Extremities: No clubbing cyanosis or edema with positive pedal pulses.    Lab Results:  [facility] 03/27/11 0719 03/26/11 0530  NA 138 139  K 4.9 4.6  CL 92* 94*  CO2 37* 38*  GLUCOSE 132* 140*  BUN 41* 45*  CREATININE 1.23* 1.46*  CALCIUM 9.9 9.7  MG -- --  PHOS -- --   No results found for this basename: AST:2,ALT:2,ALKPHOS:2,BILITOT:2,PROT:2,ALBUMIN:2 in the last 72 hours No results found for this basename: LIPASE:2,AMYLASE:2 in the last 72 hours  Basename 03/27/11 0719 03/26/11 0530  WBC 12.8* 10.2  NEUTROABS -- --  HGB 10.3* 9.2*  HCT 33.1* 29.5*  MCV 88.0 87.3  PLT 319 270   No results found for this basename: CKTOTAL:3,CKMB:3,CKMBINDEX:3,TROPONINI:3 in the last 72 hours No components found with this basename: POCBNP:3 No results found for this basename: DDIMER:2 in the last 72 hours No results found for this basename: HGBA1C:2 in the last 72 hours  Basename 03/25/11 0530  CHOL 106  HDL 34*  LDLCALC 51  TRIG 130  CHOLHDL 3.1  LDLDIRECT --   No results found for this basename: TSH,T4TOTAL,FREET3,T3FREE,THYROIDAB in the last 72 hours No results found for this basename:  VITAMINB12:2,FOLATE:2,FERRITIN:2,TIBC:2,IRON:2,RETICCTPCT:2 in the last 72 hours  Micro Results: No results found for this or any previous visit (from the past 240 hour(s)).  Studies/Results: Nm Myocar Multi W/spect W/wall Motion / Ef  03/26/2011  *RADIOLOGY REPORT*  Clinical Data:  Respiratory failure.  Morbid obesity.  COPD on home to.  Diabetes hypertension.  No apparent cardiac history.  MYOCARDIAL IMAGING WITH SPECT (REST AND PHARMACOLOGIC-STRESS - 2 DAY PROTOCOL) GATED LEFT VENTRICULAR WALL MOTION STUDY LEFT VENTRICULAR EJECTION FRACTION  Technique:  Standard myocardial SPECT imaging was performed after intravenous injection of 30 mCi Tc-46m tetrofosmin at rest.  On a different day, intravenous infusion of  Lexiscan was performed under supervision of the Cardiology staff.  At peak effect of the drug, 30 mCi Tc-60m tetrofosmin was injected intravenously and standard myocardial SPECT imaging was performed.  Quantitative gated imaging was also performed to evaluate left ventricular wall motion and estimate left ventricular ejection fraction.  Comparison:  None.  Findings: Multiplanar SPECT imaging shows grossly increased left ventricular cavity size.  Two fixed defects are identified, one is in the mid to distal inferolateral wall.  The second is a small defect in the distal anteroseptal wall. No evidence for inducible perfusion defect within the left ventricular myocardium.  Images obtained with cardiac gating displayed using a surface rendering algorithm show inferior and  distal inferolateral hypokinesia.  There is also evidence for a hypokinetic apex.  Left ventricular end-diastolic volume is calculated at 173 ml.  The left ventricular end systolic volume is calculated 629 ml.  The derived left ventricular ejection fraction is 35%.  IMPRESSION: No evidence for inducible ischemia.  Moderate inferolateral and small distal anteroseptal fixed defects are identified and may be related to scar.  Inferior and  mid/distal inferolateral hypokinesia extending into the apex.  Left ventricular ejection fraction of 35%.  Original Report Authenticated By: ERIC A. MANSELL, M.D.    Medications:     . antiseptic oral rinse  15 mL Mouth Rinse BID  . aspirin EC  325 mg Oral Daily  . benzonatate  200 mg Oral TID  . budesonide  0.5 mg Nebulization BID  . calcitRIOL  0.25 mcg Oral Daily  . citalopram  40 mg Oral Daily  . cloNIDine  0.1 mg Oral BID  . furosemide  40 mg Oral Q breakfast  . insulin aspart  0-9 Units Subcutaneous TID WC  . insulin aspart protamine-insulin aspart  35 Units Subcutaneous BID WC  . ipratropium  0.5 mg Nebulization Q6H  . levalbuterol  0.63 mg Nebulization Q6H  . losartan  50 mg Oral Daily  . moxifloxacin  400 mg Oral q1800  . mulitivitamin with minerals  1 tablet Oral Daily  . mupirocin cream   Topical Daily  . potassium chloride  40 mEq Oral Daily  . predniSONE  60 mg Oral QAC breakfast  . sodium chloride  3 mL Intravenous Q12H  . DISCONTD: furosemide  40 mg Oral BID  . DISCONTD: methylPREDNISolone (SOLU-MEDROL) injection  60 mg Intravenous Q24H  . DISCONTD: potassium chloride  40 mEq Oral BID    Assessment: Principal Problem:  *Acute-on-chronic respiratory failure Active Problems:  DIABETES MELLITUS, TYPE II  HYPERTENSION  COPD  SOB (shortness of breath)  Systolic CHF, acute  Acute on chronic systolic heart failure  Ischemic cardiomyopathy  Acute and chronic respiratory failure (acute-on-chronic)  Other chest pain  Chest pain   Plan: #1 acute on chronic respiratory failure Multifactorial in nature secondary to acute COPD exacerbation and acute systolic heart failure. Patient is slowly clinically improving. Cardiac enzymes which was cycled were negative x3. 2-D echo with EF of 30-35%. Patient had 2/ 2+ of Myoview stress test done which showed multiple non reversible WMA with EF 35%.. Clonidine been weaned off per cardiology. Continue aspirin, Lasix , Cozaar .  Continue oxygen, nebs, oral prednisone., Avelox. Follow.  #2 acute on chronic systolic heart failure Cardiac enzymes negative x3. 2-D echo with EF of 30-35% . 2/2 Myoview stress test done with multiple non reversible WMA with ef 35%. Clonidine been weaned off. Continue Cozaar. Lasix  aspirin. Cardiology following and appreciate input and recommendations.  #3 diabetes mellitus type 2 Hemoglobin A1c of 7.1. CBGs ranged from 118 through 244. NPH. Continue sliding scale insulin.  #4 acute COPD exacerbation Continue oxygen, nebs, Avelox D4/7,  Prednisone taper.  #5 chronic renal insufficiency Stable.  #6 hypertension Stable  #7 anemia H&H stable.  #8 prophylaxis SCDs for DVT prophylaxis.  LOS: 4 days   Makita Blow 03/27/2011, 12:00 PM

## 2011-03-27 NOTE — Evaluation (Signed)
Physical Therapy Evaluation Patient Details Name: [name] MRN: [id number] DOB: 04-23-1945 Today's Date: 03/27/2011  Problem List:  Patient Active Problem List  Diagnoses  . DIABETES MELLITUS, TYPE II  . HYPERCHOLESTEROLEMIA  . MORBID OBESITY  . ANXIETY  . CARPAL TUNNEL SYNDROME, BILATERAL  . HYPERTENSION  . COPD  . RESPIRATORY FAILURE, CHRONIC  . BACK PAIN, LUMBAR  . HAND PAIN, LEFT  . EDEMA  . COUGH  . CHEST PAIN  . Nonspecific (abnormal) findings on radiological and other examination of body structure  . ASYMPTOMATIC POSTMENOPAUSAL STATUS  . CT, CHEST, ABNORMAL  . DYSURIA  . FLANK PAIN  . Iron deficiency anemia, unspecified  . Hyperparathyroidism, secondary  . Renal failure  . Unspecified disorder of urethra and urinary tract  . Acute respiratory failure  . SOB (shortness of breath)  . Systolic CHF, acute  . Acute and chronic respiratory failure (acute-on-chronic)  . Acute on chronic systolic heart failure  . Other chest pain  . Chest pain  . Ischemic cardiomyopathy  . Acute-on-chronic respiratory failure    Past Medical History:  Past Medical History  Diagnosis Date  . RENAL INSUFFICIENCY 10/15/2008  . Edema 06/18/2008  . PNEUMONIA 12/01/2007  . CARPAL TUNNEL SYNDROME, BILATERAL 10/30/2007  . HAND PAIN, LEFT 09/04/2007  . Morbid obesity   . ANXIETY 11/08/2006  . HYPERTENSION 11/08/2006  . DIABETES MELLITUS, TYPE II 11/08/2006  . HYPERCHOLESTEROLEMIA 07/24/2009  . ANEMIA 07/24/2009  . COPD 11/08/2006    -HFA 50% p coaching 10/15/2009  . RESPIRATORY FAILURE, CHRONIC 10/15/2009  . BACK PAIN, LUMBAR 03/07/2009  . CHEST PAIN 07/24/2009  . Restless leg syndrome   . Systolic CHF, acute 03/24/2011  . Ischemic cardiomyopathy 03/26/2011   Past Surgical History:  Past Surgical History  Procedure Date  . Abdominal hysterectomy     PT Assessment/Plan/Recommendation PT Assessment Clinical Impression Statement: Pt adm with COPD and CHF.  Pt dizzy today with amb  after several days of little activity.  Expect pt will progress and be able to return home with daughter and HHPT. PT Recommendation/Assessment: Patient will need skilled PT in the acute care venue PT Problem List: Decreased strength;Decreased activity tolerance;Decreased balance;Decreased mobility PT Therapy Diagnosis : Difficulty walking;Generalized weakness PT Plan PT Frequency: Min 3X/week PT Treatment/Interventions: DME instruction;Gait training;Functional mobility training;Therapeutic activities;Therapeutic exercise;Balance training;Patient/family education PT Recommendation Follow Up Recommendations: Home health PT Equipment Recommended: None recommended by PT PT Goals  Acute Rehab PT Goals PT Goal Formulation: With patient Time For Goal Achievement: 7 days Pt will go Supine/Side to Sit: with modified independence PT Goal: Supine/Side to Sit - Progress: Goal set today Pt will go Sit to Supine/Side: with modified independence PT Goal: Sit to Supine/Side - Progress: Goal set today Pt will go Sit to Stand: with modified independence PT Goal: Sit to Stand - Progress: Goal set today Pt will go Stand to Sit: with modified independence PT Goal: Stand to Sit - Progress: Goal set today Pt will Ambulate: 51 - 150 feet;with supervision;with least restrictive assistive device PT Goal: Ambulate - Progress: Goal set today  PT Evaluation Precautions/Restrictions  Precautions Precautions: Fall Prior Functioning  Home Living Lives With: Daughter (all the time) Type of Home: House Home Layout: One level Home Access: Stairs to enter Entrance Stairs-Rails: None Entrance Stairs-Number of Steps: 3 Bathroom Shower/Tub: Forensic scientist: Standard Bathroom Accessibility: No Home Adaptive Equipment: Reacher Additional Comments: O2 Prior Function Level of Independence: Independent with basic ADLs;Independent with homemaking with ambulation;Independent with  gait;Independent with transfers Driving: No Vocation: On disability Cognition Cognition Arousal/Alertness: Awake/alert Overall Cognitive Status: Appears within functional limits for tasks assessed Sensation/Coordination   Extremity Assessment RLE Strength RLE Overall Strength Comments: grossly 4/5 LLE Strength LLE Overall Strength Comments: grossly 4/5 Mobility (including Balance) Transfers Transfers: Yes Sit to Stand: 4: Min assist;From bed;With upper extremity assist (min guard ) Stand to Sit: 4: Min assist;With upper extremity assist;To bed (min guard) Ambulation/Gait Ambulation/Gait: Yes Ambulation/Gait Assistance: 4: Min assist (pt reports dizziness) Ambulation/Gait Assistance Details (indicate cue type and reason): one loss of balance to the rt and posteriorly Ambulation Distance (Feet): 25 Feet Assistive device: Rolling walker Gait Pattern: Decreased stride length Gait velocity: slow pace  Static Standing Balance Static Standing - Balance Support: Bilateral upper extremity supported (on walker) Static Standing - Level of Assistance: 4: Min assist Exercise    End of Session PT - End of Session Equipment Utilized During Treatment: Gait belt Activity Tolerance: Patient limited by fatigue Patient left: in bed;with call bell in reach (sitting on EOB) Nurse Communication: Mobility status for ambulation General Behavior During Session: [facility] for tasks performed Cognition: [facility] for tasks performed  [employer] 03/27/2011, 11:13 AM  [employer] PT [phone]

## 2011-03-27 NOTE — Progress Notes (Signed)
Patient ID: [name], female   DOB: 11-13-1945, 66 y.o.   MRN: [id number] Subjective:  Dyspnea improved. No chest pain.  Objective:  Vital Signs in the last 24 hours: Temp:  [97.4 F (36.3 C)-98.5 F (36.9 C)] 97.8 F (36.6 C) (03/16 0513) Pulse Rate:  [96-124] 99  (03/16 0513) Resp:  [20-24] 20  (03/16 0513) BP: (137-170)/(60-88) 156/69 mmHg (03/16 0513) SpO2:  [93 %-99 %] 93 % (03/16 0513) Weight:  [117.4 kg (258 lb 13.1 oz)] 117.4 kg (258 lb 13.1 oz) (03/15 2032)  Intake/Output from previous day: 03/15 0701 - 03/16 0700 In: 603 [P.O.:600; I.V.:3] Out: 2550 [Urine:2550] Intake/Output from this shift:    Physical Exam: Obese appearing NAD HEENT: Unremarkable Neck:  No JVD, no thyromegally Lungs:  Clear with no wheezes. HEART:  Regular rate rhythm, no murmurs, no rubs, no clicks Abd:  obese, positive bowel sounds, no organomegally, no rebound, no guarding Ext:  2 plus pulses, 1+ edema, no cyanosis, no clubbing Skin:  No rashes no nodules Neuro:  CN II through XII intact, motor grossly intact  Lab Results:  Basename 03/27/11 0719 03/26/11 0530  WBC 12.8* 10.2  HGB 10.3* 9.2*  PLT 319 270    Basename 03/26/11 0530 03/25/11 0530  NA 139 137  K 4.6 4.6  CL 94* 95*  CO2 38* 37*  GLUCOSE 140* 126*  BUN 45* 40*  CREATININE 1.46* 1.41*   No results found for this basename: TROPONINI:2,CK,MB:2 in the last 72 hours Hepatic Function Panel No results found for this basename: PROT,ALBUMIN,AST,ALT,ALKPHOS,BILITOT,BILIDIR,IBILI in the last 72 hours  Basename 03/25/11 0530  CHOL 106   No results found for this basename: PROTIME in the last 72 hours  Imaging: Nm Myocar Multi W/spect W/wall Motion / Ef  03/26/2011  *RADIOLOGY REPORT*  Clinical Data:  Respiratory failure.  Morbid obesity.  COPD on home to.  Diabetes hypertension.  No apparent cardiac history.  MYOCARDIAL IMAGING WITH SPECT (REST AND PHARMACOLOGIC-STRESS - 2 DAY PROTOCOL) GATED LEFT VENTRICULAR WALL  MOTION STUDY LEFT VENTRICULAR EJECTION FRACTION  Technique:  Standard myocardial SPECT imaging was performed after intravenous injection of 30 mCi Tc-57m tetrofosmin at rest.  On a different day, intravenous infusion of  Lexiscan was performed under supervision of the Cardiology staff.  At peak effect of the drug, 30 mCi Tc-78m tetrofosmin was injected intravenously and standard myocardial SPECT imaging was performed.  Quantitative gated imaging was also performed to evaluate left ventricular wall motion and estimate left ventricular ejection fraction.  Comparison:  None.  Findings: Multiplanar SPECT imaging shows grossly increased left ventricular cavity size.  Two fixed defects are identified, one is in the mid to distal inferolateral wall.  The second is a small defect in the distal anteroseptal wall. No evidence for inducible perfusion defect within the left ventricular myocardium.  Images obtained with cardiac gating displayed using a surface rendering algorithm show inferior and distal inferolateral hypokinesia.  There is also evidence for a hypokinetic apex.  Left ventricular end-diastolic volume is calculated at 173 ml.  The left ventricular end systolic volume is calculated 409 ml.  The derived left ventricular ejection fraction is 35%.  IMPRESSION: No evidence for inducible ischemia.  Moderate inferolateral and small distal anteroseptal fixed defects are identified and may be related to scar.  Inferior and mid/distal inferolateral hypokinesia extending into the apex.  Left ventricular ejection fraction of 35%.  Original Report Authenticated By: ERIC A. MANSELL, M.D.    Cardiac Studies: Tele - NSR  Stress test - multiple non-reversible wall motion abnormalities with an EF 35%. Assessment/Plan:  1. Acute/chronic systolic CHF 2. COPD 3. Abnormal stress test 4. HTN REC: Her CHF symptoms are improved. She may well have CAD though her stress test does not suggest ischemia. I would continue diuresis,  advance activity and will discuss whether she would like to proceed with R and L heart cath.  LOS: 4 days    Lewayne Bunting 03/27/2011, 8:21 AM

## 2011-03-27 NOTE — Evaluation (Signed)
Occupational Therapy Evaluation Patient Details Name: [name] MRN: [id number] DOB: 03/16/1945 Today's Date: 03/27/2011  Problem List:  Patient Active Problem List  Diagnoses  . DIABETES MELLITUS, TYPE II  . HYPERCHOLESTEROLEMIA  . MORBID OBESITY  . ANXIETY  . CARPAL TUNNEL SYNDROME, BILATERAL  . HYPERTENSION  . COPD  . RESPIRATORY FAILURE, CHRONIC  . BACK PAIN, LUMBAR  . HAND PAIN, LEFT  . EDEMA  . COUGH  . CHEST PAIN  . Nonspecific (abnormal) findings on radiological and other examination of body structure  . ASYMPTOMATIC POSTMENOPAUSAL STATUS  . CT, CHEST, ABNORMAL  . DYSURIA  . FLANK PAIN  . Iron deficiency anemia, unspecified  . Hyperparathyroidism, secondary  . Renal failure  . Unspecified disorder of urethra and urinary tract  . Acute respiratory failure  . SOB (shortness of breath)  . Systolic CHF, acute  . Acute and chronic respiratory failure (acute-on-chronic)  . Acute on chronic systolic heart failure  . Other chest pain  . Chest pain  . Ischemic cardiomyopathy  . Acute-on-chronic respiratory failure    Past Medical History:  Past Medical History  Diagnosis Date  . RENAL INSUFFICIENCY 10/15/2008  . Edema 06/18/2008  . PNEUMONIA 12/01/2007  . CARPAL TUNNEL SYNDROME, BILATERAL 10/30/2007  . HAND PAIN, LEFT 09/04/2007  . Morbid obesity   . ANXIETY 11/08/2006  . HYPERTENSION 11/08/2006  . DIABETES MELLITUS, TYPE II 11/08/2006  . HYPERCHOLESTEROLEMIA 07/24/2009  . ANEMIA 07/24/2009  . COPD 11/08/2006    -HFA 50% p coaching 10/15/2009  . RESPIRATORY FAILURE, CHRONIC 10/15/2009  . BACK PAIN, LUMBAR 03/07/2009  . CHEST PAIN 07/24/2009  . Restless leg syndrome   . Systolic CHF, acute 03/24/2011  . Ischemic cardiomyopathy 03/26/2011   Past Surgical History:  Past Surgical History  Procedure Date  . Abdominal hysterectomy     OT Assessment/Plan/Recommendation OT Assessment Clinical Impression Statement: 66 yo female currently hospitalized due to COPD  exacerabation. Pt currently with pending CAD work up. Pt uses 4 L oxygen at baseline and required 6 L oxygen during this session with decrease O2 saturation to 84%. Pt could benefit from skilled OT acutely OT Recommendation/Assessment: Patient will need skilled OT in the acute care venue OT Problem List: Decreased strength;Decreased activity tolerance;Impaired balance (sitting and/or standing);Cardiopulmonary status limiting activity OT Therapy Diagnosis : Generalized weakness OT Plan OT Frequency: Min 2X/week OT Treatment/Interventions: Self-care/ADL training;DME and/or AE instruction;Therapeutic activities;Patient/family education;Balance training OT Recommendation Follow Up Recommendations: Home health OT Equipment Recommended: None recommended by OT Individuals Consulted Consulted and Agree with Results and Recommendations: Patient OT Goals Acute Rehab OT Goals OT Goal Formulation: With patient Time For Goal Achievement: 2 weeks ADL Goals Pt Will Perform Grooming: with modified independence;Sitting at sink ADL Goal: Grooming - Progress: Goal set today Pt Will Perform Upper Body Bathing: with modified independence;Sitting at sink ADL Goal: Upper Body Bathing - Progress: Goal set today Pt Will Perform Upper Body Dressing: with modified independence;Sit to stand from chair;Sit to stand from bed ADL Goal: Upper Body Dressing - Progress: Goal set today Pt Will Transfer to Toilet: with modified independence;3-in-1 ADL Goal: Toilet Transfer - Progress: Goal set today Pt Will Perform Toileting - Clothing Manipulation: with modified independence;Sitting on 3-in-1 or toilet ADL Goal: Toileting - Clothing Manipulation - Progress: Goal set today Pt Will Perform Toileting - Hygiene: with modified independence;Sit to stand from 3-in-1/toilet ADL Goal: Toileting - Hygiene - Progress: Goal set today Miscellaneous OT Goals Miscellaneous OT Goal #1: Pt will complete bed mobility  Mod I with HOB 20  degrees or less no bed rails as precursor to ADLS OT Goal: Miscellaneous Goal #1 - Progress: Goal set today  OT Evaluation Precautions/Restrictions  Precautions Precautions: Fall Prior Functioning Home Living Lives With: Daughter (all the time) Type of Home: House Home Layout: One level Home Access: Stairs to enter Entrance Stairs-Rails: None Entrance Stairs-Number of Steps: 3 Bathroom Shower/Tub: Forensic scientist: Standard Bathroom Accessibility: No Home Adaptive Equipment: Tub transfer bench;Walker - rolling Additional Comments: O2 Prior Function Level of Independence: Independent with basic ADLs;Independent with homemaking with ambulation;Independent with gait;Independent with transfers Driving: No Vocation: On disability ADL ADL Eating/Feeding: Performed;Supervision/safety Where Assessed - Eating/Feeding: Edge of bed Grooming: Simulated;Wash/dry hands;Supervision/safety Where Assessed - Grooming: Sitting, bed;Unsupported Upper Body Bathing: Simulated;Chest;Right arm;Left arm;Abdomen;Supervision/safety Where Assessed - Upper Body Bathing: Sitting, chair (increased time due to oxygen levels) Lower Body Dressing: Performed;+1 Total assistance Where Assessed - Lower Body Dressing: Sitting, bed;Unsupported Toilet Transfer: Simulated;Minimal assistance Toilet Transfer Method: Ambulating Toilet Transfer Equipment: Raised toilet seat with arms (or 3-in-1 over toilet) Toileting - Clothing Manipulation: Simulated;Minimal assistance Where Assessed - Toileting Clothing Manipulation: Sit to stand from 3-in-1 or toilet Toileting - Hygiene: Simulated;Minimal assistance Where Assessed - Toileting Hygiene: Sit to stand from 3-in-1 or toilet Equipment Used: Rolling walker Ambulation Related to ADLs: Pt ambulating Min (A) and decreased oxygen saturation time. Pt required restbreak with ambulation x2 for ~ 28ft . ADL Comments: Pt is limited by oxygen levels currently  and fatigue. Pt c/o dizziness during session. Pt with O2 levels 6L 84% s/p ambulation. pt resting at EOB on arrival 88% on 4 L O2.. Vision/Perception  Vision - History Baseline Vision: No visual deficits Cognition Cognition Arousal/Alertness: Awake/alert Overall Cognitive Status: Appears within functional limits for tasks assessed Sensation/Coordination Coordination Gross Motor Movements are Fluid and Coordinated: Yes Fine Motor Movements are Fluid and Coordinated: Yes Extremity Assessment RUE Assessment RUE Assessment: Within Functional Limits (grossly) LUE Assessment LUE Assessment: Within Functional Limits (grossly) Mobility  Bed Mobility Bed Mobility: No Transfers Transfers: Yes Sit to Stand: 4: Min assist;From bed;With upper extremity assist Stand to Sit: 4: Min assist;With upper extremity assist;To bed Exercises   End of Session OT - End of Session Equipment Utilized During Treatment: Gait belt Activity Tolerance: Patient tolerated treatment well Patient left: in bed;with call bell in reach Nurse Communication: Mobility status for transfers General Behavior During Session: [facility] for tasks performed Cognition: [facility] for tasks performed  If patient d/c to SNF level requesting to d/c to [facility] (pt previous at d/c site for prior COPD exacerbation)   Lucile Shutters 03/27/2011, 1:22 PM  Pager: [phone]

## 2011-03-28 DIAGNOSIS — J441 Chronic obstructive pulmonary disease with (acute) exacerbation: Secondary | ICD-10-CM

## 2011-03-28 LAB — CBC
HCT: 31.5 % — ABNORMAL LOW (ref 36.0–46.0)
Hemoglobin: 9.8 g/dL — ABNORMAL LOW (ref 12.0–15.0)
MCH: 27.1 pg (ref 26.0–34.0)
MCV: 87 fL (ref 78.0–100.0)
Platelets: 278 10*3/uL (ref 150–400)
RBC: 3.62 MIL/uL — ABNORMAL LOW (ref 3.87–5.11)
WBC: 12.9 10*3/uL — ABNORMAL HIGH (ref 4.0–10.5)

## 2011-03-28 LAB — BASIC METABOLIC PANEL
CO2: 39 mEq/L — ABNORMAL HIGH (ref 19–32)
Calcium: 9.9 mg/dL (ref 8.4–10.5)
Calcium: 9.9 mg/dL (ref 8.4–10.5)
Chloride: 93 mEq/L — ABNORMAL LOW (ref 96–112)
Creatinine, Ser: 1.45 mg/dL — ABNORMAL HIGH (ref 0.50–1.10)
GFR calc Af Amer: 43 mL/min — ABNORMAL LOW (ref >90)
Glucose, Bld: 118 mg/dL — ABNORMAL HIGH (ref 70–99)
Sodium: 137 mEq/L (ref 135–145)

## 2011-03-28 LAB — DIFFERENTIAL
Eosinophils Absolute: 0.2 10*3/uL (ref 0.0–0.7)
Eosinophils Relative: 1 % (ref 0–5)
Lymphocytes Relative: 22 % (ref 12–46)
Lymphs Abs: 2.9 10*3/uL (ref 0.7–4.0)
Monocytes Absolute: 1.1 10*3/uL — ABNORMAL HIGH (ref 0.1–1.0)
Monocytes Relative: 8 % (ref 3–12)

## 2011-03-28 LAB — GLUCOSE, CAPILLARY
Glucose-Capillary: 126 mg/dL — ABNORMAL HIGH (ref 70–99)
Glucose-Capillary: 306 mg/dL — ABNORMAL HIGH (ref 70–99)
Glucose-Capillary: 201 mg/dL — ABNORMAL HIGH (ref 70–99)

## 2011-03-28 MED ORDER — PREDNISONE 20 MG PO TABS
40.0000 mg | ORAL_TABLET | Freq: Every day | ORAL | Status: AC
  Administered 2011-03-29: 40 mg via ORAL
  Filled 2011-03-28: qty 2

## 2011-03-28 MED ORDER — INSULIN ASPART PROT & ASPART (70-30 MIX) 100 UNIT/ML ~~LOC~~ SUSP
35.0000 [IU] | Freq: Two times a day (BID) | SUBCUTANEOUS | Status: DC
  Administered 2011-03-28 – 2011-03-30 (×5): 35 [IU] via SUBCUTANEOUS
  Filled 2011-03-28: qty 10

## 2011-03-28 NOTE — Progress Notes (Signed)
Subjective: Patient states shortness of breath improved. Patient feels better. Patient somewhat scared about possible cardiac catheterization.  Objective: Vital signs in last 24 hours: Filed Vitals:   03/28/11 1126 03/28/11 1446 03/28/11 1800 03/28/11 1926  BP: 141/51 139/54 130/87   Pulse: 85 95 95   Temp: 97.8 F (36.6 C) 97.8 F (36.6 C) 97.3 F (36.3 C)   TempSrc: Oral Oral Oral   Resp: 19 19 20    Height:      Weight:      SpO2: 97% 95% 98% 97%    Intake/Output Summary (Last 24 hours) at 03/28/11 1944 Last data filed at 03/28/11 1843  Gross per 24 hour  Intake   1083 ml  Output   3750 ml  Net  -2667 ml    Weight change:   General: Alert, awake, oriented x3, in no acute distress. Heart: Regular rate and rhythm, without murmurs, rubs, gallops. Lungs: Fair air movement. No wheezes. Abdomen: Soft, nontender, nondistended, positive bowel sounds. Extremities: No clubbing cyanosis or edema with positive pedal pulses.    Lab Results:  [facility] 03/28/11 0711 03/27/11 0719  NA 137 138  K 5.4* 4.9  CL 93* 92*  CO2 39* 37*  GLUCOSE 118* 132*  BUN 43* 41*  CREATININE 1.34* 1.23*  CALCIUM 9.9 9.9  MG -- --  PHOS -- --   No results found for this basename: AST:2,ALT:2,ALKPHOS:2,BILITOT:2,PROT:2,ALBUMIN:2 in the last 72 hours No results found for this basename: LIPASE:2,AMYLASE:2 in the last 72 hours  Basename 03/28/11 0711 03/27/11 0719  WBC 12.9* 12.8*  NEUTROABS 8.8* --  HGB 9.8* 10.3*  HCT 31.5* 33.1*  MCV 87.0 88.0  PLT 278 319   No results found for this basename: CKTOTAL:3,CKMB:3,CKMBINDEX:3,TROPONINI:3 in the last 72 hours No components found with this basename: POCBNP:3 No results found for this basename: DDIMER:2 in the last 72 hours No results found for this basename: HGBA1C:2 in the last 72 hours No results found for this basename: CHOL:2,HDL:2,LDLCALC:2,TRIG:2,CHOLHDL:2,LDLDIRECT:2 in the last 72 hours No results found for this basename:  TSH,T4TOTAL,FREET3,T3FREE,THYROIDAB in the last 72 hours No results found for this basename: VITAMINB12:2,FOLATE:2,FERRITIN:2,TIBC:2,IRON:2,RETICCTPCT:2 in the last 72 hours  Micro Results: No results found for this or any previous visit (from the past 240 hour(s)).  Studies/Results: No results found.  Medications:     . antiseptic oral rinse  15 mL Mouth Rinse BID  . aspirin EC  325 mg Oral Daily  . benzonatate  200 mg Oral TID  . budesonide  0.5 mg Nebulization BID  . calcitRIOL  0.25 mcg Oral Daily  . citalopram  40 mg Oral Daily  . cloNIDine  0.1 mg Oral BID  . furosemide  40 mg Oral Q breakfast  . insulin aspart  0-9 Units Subcutaneous TID WC  . insulin aspart protamine-insulin aspart  35 Units Subcutaneous BID WC  . ipratropium  0.5 mg Nebulization Q6H  . levalbuterol  0.63 mg Nebulization Q6H  . losartan  50 mg Oral Daily  . moxifloxacin  400 mg Oral q1800  . mulitivitamin with minerals  1 tablet Oral Daily  . mupirocin cream   Topical Daily  . predniSONE  40 mg Oral QAC breakfast  . sodium chloride  3 mL Intravenous Q12H  . DISCONTD: insulin aspart protamine-insulin aspart  35 Units Subcutaneous BID WC  . DISCONTD: potassium chloride  40 mEq Oral Daily  . DISCONTD: predniSONE  60 mg Oral QAC breakfast    Assessment: Principal Problem:  *Acute-on-chronic respiratory failure Active  Problems:  DIABETES MELLITUS, TYPE II  HYPERTENSION  COPD  SOB (shortness of breath)  Systolic CHF, acute  Acute on chronic systolic heart failure  Ischemic cardiomyopathy  Acute and chronic respiratory failure (acute-on-chronic)  Other chest pain  Chest pain   Plan: #1 acute on chronic respiratory failure Multifactorial in nature secondary to acute COPD exacerbation and acute systolic heart failure. Patient is slowly clinically improving. Cardiac enzymes which was cycled were negative x3. 2-D echo with EF of 30-35%. Patient had 2/ 2+ of Myoview stress test done which showed  multiple non reversible WMA with EF 35%.. Clonidine is being weaned off per cardiology. Continue aspirin, Lasix , Cozaar . Continue oxygen, nebs, oral prednisone taper., Avelox. Follow.  #2 acute on chronic systolic heart failure Cardiac enzymes negative x3. 2-D echo with EF of 30-35% . 2/2 Myoview stress test done with multiple non reversible WMA with ef 35%. Clonidine being weaned off. Continue Cozaar. Lasix  aspirin. Cardiology following and appreciate input and recommendations.  #3 diabetes mellitus type 2 Hemoglobin A1c of 7.1. CBGs ranged from 119 through 306. NPH. Continue sliding scale insulin. CBGs should improve as steroids are being tapered down.  #4 acute COPD exacerbation Continue oxygen, nebs, Avelox D5/7,  Prednisone taper.  #5 chronic renal insufficiency Stable.  #6 hypertension Stable  #7 anemia H&H stable.  #8 prophylaxis SCDs for DVT prophylaxis.  LOS: 5 days   [facility] 03/28/2011, 7:44 PM

## 2011-03-28 NOTE — Progress Notes (Signed)
Patient ID: [name], female   DOB: 1945-04-09, 66 y.o.   MRN: [id number] Subjective:  Dyspnea improved. No chest pain.  Objective:  Vital Signs in the last 24 hours: Temp:  [97.4 F (36.3 C)-98 F (36.7 C)] 98 F (36.7 C) (03/17 0550) Pulse Rate:  [83-109] 85  (03/17 0550) Resp:  [18-21] 18  (03/17 0550) BP: (137-157)/(52-69) 137/52 mmHg (03/17 0550) SpO2:  [86 %-98 %] 98 % (03/17 0550)  Intake/Output from previous day: 03/16 0701 - 03/17 0700 In: 1203 [P.O.:1200; I.V.:3] Out: 3200 [Urine:3200] Intake/Output from this shift:    Physical Exam: Obese appearing NAD HEENT: Unremarkable Neck:  No JVD, no thyromegally Lungs:  Clear with no wheezes. HEART:  Regular rate rhythm, no murmurs, no rubs, no clicks Abd:  obese, positive bowel sounds, no organomegally, no rebound, no guarding Ext:  2 plus pulses, 1+ edema, no cyanosis, no clubbing Skin:  No rashes no nodules Neuro:  CN II through XII intact, motor grossly intact  Lab Results:  Basename 03/28/11 0711 03/27/11 0719  WBC 12.9* 12.8*  HGB 9.8* 10.3*  PLT 278 319    Basename 03/27/11 0719 03/26/11 0530  NA 138 139  K 4.9 4.6  CL 92* 94*  CO2 37* 38*  GLUCOSE 132* 140*  BUN 41* 45*  CREATININE 1.23* 1.46*   No results found for this basename: TROPONINI:2,CK,MB:2 in the last 72 hours Hepatic Function Panel No results found for this basename: PROT,ALBUMIN,AST,ALT,ALKPHOS,BILITOT,BILIDIR,IBILI in the last 72 hours No results found for this basename: CHOL in the last 72 hours No results found for this basename: PROTIME in the last 72 hours  Imaging: Nm Myocar Multi W/spect W/wall Motion / Ef  03/26/2011  *RADIOLOGY REPORT*  Clinical Data:  Respiratory failure.  Morbid obesity.  COPD on home to.  Diabetes hypertension.  No apparent cardiac history.  MYOCARDIAL IMAGING WITH SPECT (REST AND PHARMACOLOGIC-STRESS - 2 DAY PROTOCOL) GATED LEFT VENTRICULAR WALL MOTION STUDY LEFT VENTRICULAR EJECTION FRACTION  Technique:   Standard myocardial SPECT imaging was performed after intravenous injection of 30 mCi Tc-42m tetrofosmin at rest.  On a different day, intravenous infusion of  Lexiscan was performed under supervision of the Cardiology staff.  At peak effect of the drug, 30 mCi Tc-30m tetrofosmin was injected intravenously and standard myocardial SPECT imaging was performed.  Quantitative gated imaging was also performed to evaluate left ventricular wall motion and estimate left ventricular ejection fraction.  Comparison:  None.  Findings: Multiplanar SPECT imaging shows grossly increased left ventricular cavity size.  Two fixed defects are identified, one is in the mid to distal inferolateral wall.  The second is a small defect in the distal anteroseptal wall. No evidence for inducible perfusion defect within the left ventricular myocardium.  Images obtained with cardiac gating displayed using a surface rendering algorithm show inferior and distal inferolateral hypokinesia.  There is also evidence for a hypokinetic apex.  Left ventricular end-diastolic volume is calculated at 173 ml.  The left ventricular end systolic volume is calculated 409 ml.  The derived left ventricular ejection fraction is 35%.  IMPRESSION: No evidence for inducible ischemia.  Moderate inferolateral and small distal anteroseptal fixed defects are identified and may be related to scar.  Inferior and mid/distal inferolateral hypokinesia extending into the apex.  Left ventricular ejection fraction of 35%.  Original Report Authenticated By: ERIC A. MANSELL, M.D.    Cardiac Studies: Tele - NSR Stress test - multiple non-reversible wall motion abnormalities with an EF 35%. Assessment/Plan:  1. Acute/chronic systolic CHF 2. COPD 3. Abnormal stress test 4. HTN REC: Her CHF symptoms are improved. She may well have CAD though her stress test does not suggest ischemia. I discussed the indication for right and left heart catheterization and possible  complications. She is oxygen dependent and would not be a surgical candidate. She has not been on medical therapy and I explained that a trial medication including aggressive treatment of CHF and probable CAD would be an option versus proceeding with right and left heart catheterization. She is reflecting. I will make her npo after midnight and discuss with Dr. Graciela Husbands and others tomorrow.  LOS: 5 days    Lewayne Bunting 03/28/2011, 8:41 AM

## 2011-03-29 DIAGNOSIS — D72829 Elevated white blood cell count, unspecified: Secondary | ICD-10-CM

## 2011-03-29 DIAGNOSIS — R9439 Abnormal result of other cardiovascular function study: Secondary | ICD-10-CM

## 2011-03-29 LAB — URINE MICROSCOPIC-ADD ON

## 2011-03-29 LAB — BASIC METABOLIC PANEL WITH GFR
BUN: 45 mg/dL — ABNORMAL HIGH (ref 6–23)
BUN: 45 mg/dL — ABNORMAL HIGH (ref 6–23)
CO2: 39 meq/L — ABNORMAL HIGH (ref 19–32)
CO2: 39 meq/L — ABNORMAL HIGH (ref 19–32)
Calcium: 9.9 mg/dL (ref 8.4–10.5)
Calcium: 10 mg/dL (ref 8.4–10.5)
Chloride: 90 meq/L — ABNORMAL LOW (ref 96–112)
Chloride: 93 meq/L — ABNORMAL LOW (ref 96–112)
Creatinine, Ser: 1.4 mg/dL — ABNORMAL HIGH (ref 0.50–1.10)
Creatinine, Ser: 1.42 mg/dL — ABNORMAL HIGH (ref 0.50–1.10)
GFR calc Af Amer: 45 mL/min — ABNORMAL LOW
GFR calc Af Amer: 44 mL/min — ABNORMAL LOW
GFR calc non Af Amer: 38 mL/min — ABNORMAL LOW
GFR calc non Af Amer: 38 mL/min — ABNORMAL LOW
Glucose, Bld: 111 mg/dL — ABNORMAL HIGH (ref 70–99)
Glucose, Bld: 112 mg/dL — ABNORMAL HIGH (ref 70–99)
Potassium: 5.3 meq/L — ABNORMAL HIGH (ref 3.5–5.1)
Potassium: 5.3 meq/L — ABNORMAL HIGH (ref 3.5–5.1)
Sodium: 135 meq/L (ref 135–145)
Sodium: 138 meq/L (ref 135–145)

## 2011-03-29 LAB — URINALYSIS, ROUTINE W REFLEX MICROSCOPIC
Glucose, UA: NEGATIVE mg/dL
Hgb urine dipstick: NEGATIVE
Specific Gravity, Urine: 1.013 (ref 1.005–1.030)
Urobilinogen, UA: 0.2 mg/dL (ref 0.0–1.0)

## 2011-03-29 LAB — GLUCOSE, CAPILLARY
Glucose-Capillary: 113 mg/dL — ABNORMAL HIGH (ref 70–99)
Glucose-Capillary: 191 mg/dL — ABNORMAL HIGH (ref 70–99)
Glucose-Capillary: 322 mg/dL — ABNORMAL HIGH (ref 70–99)
Glucose-Capillary: 121 mg/dL — ABNORMAL HIGH (ref 70–99)

## 2011-03-29 LAB — CBC
Platelets: 324 10*3/uL (ref 150–400)
RDW: 14.7 % (ref 11.5–15.5)
WBC: 15.8 10*3/uL — ABNORMAL HIGH (ref 4.0–10.5)

## 2011-03-29 MED ORDER — AMLODIPINE BESYLATE 5 MG PO TABS
5.0000 mg | ORAL_TABLET | Freq: Every day | ORAL | Status: DC
  Administered 2011-03-29 – 2011-04-01 (×4): 5 mg via ORAL
  Filled 2011-03-29 (×4): qty 1

## 2011-03-29 MED ORDER — SODIUM POLYSTYRENE SULFONATE 15 GM/60ML PO SUSP
45.0000 g | Freq: Once | ORAL | Status: AC
  Administered 2011-03-29: 45 g via ORAL
  Filled 2011-03-29: qty 180

## 2011-03-29 NOTE — Progress Notes (Signed)
Advanced Heart Failure Rounding Note   Subjective:    [name] was admitted on 3/14 for progressive dyspnea and edema.  Felt to be secondary to systolic HF and COPD exacerbation.    Myoview on 3/15 showed no evidence for inducible ischemia. Moderate inferolateral and small distal anteroseptal fixed defects are identified and may be related to scar.   Inferior and mid/distal inferolateral hypokinesia extending into the apex.  Left ventricular ejection fraction of 35%.  Her breathing/edema has improved.  Chest pain yesterday with coughing.  She has chronic 2-3 pillow orthopnea.  Weight down 7 pounds.    Objective:    Vital Signs:   Temp:  [97.3 F (36.3 C)-98.6 F (37 C)] 98.6 F (37 C) (03/18 1000) Pulse Rate:  [80-95] 80  (03/18 1000) Resp:  [18-20] 20  (03/18 1000) BP: (122-157)/(54-94) 122/68 mmHg (03/18 1000) SpO2:  [95 %-98 %] 98 % (03/18 1000) Weight:  [115.9 kg (255 lb 8.2 oz)] 115.9 kg (255 lb 8.2 oz) (03/17 2129) Last BM Date: 03/28/11  24-hour weight change: Weight change:   Intake/Output:   Intake/Output Summary (Last 24 hours) at 03/29/11 1426 Last data filed at 03/29/11 0900  Gross per 24 hour  Intake    600 ml  Output   1750 ml  Net  -1150 ml     Physical Exam: General:  Obese, no resp difficulty HEENT: normal Neck: supple. JVP hard to see.  Carotids 2+ bilat; no bruits. No lymphadenopathy or thryomegaly appreciated. Cor: PMI nondisplaced. Distant regular rate & rhythm. No rubs, gallops or murmurs. Lungs: clear Abdomen: soft, nontender, nondistended. No hepatosplenomegaly. No bruits or masses. Good bowel sounds. Extremities: no cyanosis, clubbing, rash, edema Neuro: alert & orientedx3, cranial nerves grossly intact. moves all 4 extremities w/o difficulty. Affect pleasant   Labs: Basic Metabolic Panel:  Lab 03/29/11 1610 03/29/11 0605 03/28/11 1940 03/28/11 0711 03/27/11 0719  NA 138 135 133* 137 138  K 5.3* 5.3* 5.6* 5.4* 4.9  CL 93* 90* 88* 93*  92*  CO2 39* 39* 39* 39* 37*  GLUCOSE 112* 111* 220* 118* 132*  BUN 45* 45* 46* 43* 41*  CREATININE 1.42* 1.40* 1.45* 1.34* 1.23*  CALCIUM 10.0 9.9 9.9 -- --  MG -- -- -- -- --  PHOS -- -- -- -- --    Liver Function Tests:  Lab 03/23/11 1005 03/23/11 0318  AST 19 17  ALT 18 16  ALKPHOS 57 49  BILITOT 0.3 0.3  PROT 7.5 7.1  ALBUMIN 3.0* 3.0*   No results found for this basename: LIPASE:5,AMYLASE:5 in the last 168 hours No results found for this basename: AMMONIA:3 in the last 168 hours  CBC:  Lab 03/29/11 0605 03/28/11 0711 03/27/11 0719 03/26/11 0530 03/25/11 0530 03/23/11 0318  WBC 15.8* 12.9* 12.8* 10.2 9.6 --  NEUTROABS -- 8.8* -- -- -- 8.7*  HGB 10.6* 9.8* 10.3* 9.2* 8.7* --  HCT 33.7* 31.5* 33.1* 29.5* 27.8* --  MCV 87.8 87.0 88.0 87.3 88.0 --  PLT 324 278 319 270 248 --    Cardiac Enzymes:  Lab 03/24/11 0128 03/23/11 1713 03/23/11 1005 03/23/11 0620  CKTOTAL 226* 223* 197* --  CKMB 7.4* 6.7* 5.4* --  CKMBINDEX -- -- -- --  TROPONINI <0.30 <0.30 <0.30 <0.30    BNP: No components found with this basename: POCBNP:5  CBG:  Lab 03/29/11 1144 03/29/11 0747 03/28/11 2133 03/28/11 1719 03/28/11 1217  GLUCAP 191* 113* 201* 306* 126*    Coagulation Studies: No results  found for this basename: LABPROT:5,INR:5 in the last 72 hours    Imaging: No results found.   Medications:     Scheduled Medications:    . antiseptic oral rinse  15 mL Mouth Rinse BID  . aspirin EC  325 mg Oral Daily  . benzonatate  200 mg Oral TID  . budesonide  0.5 mg Nebulization BID  . calcitRIOL  0.25 mcg Oral Daily  . citalopram  40 mg Oral Daily  . cloNIDine  0.1 mg Oral BID  . furosemide  40 mg Oral Q breakfast  . insulin aspart  0-9 Units Subcutaneous TID WC  . insulin aspart protamine-insulin aspart  35 Units Subcutaneous BID WC  . ipratropium  0.5 mg Nebulization Q6H  . levalbuterol  0.63 mg Nebulization Q6H  . losartan  50 mg Oral Daily  . moxifloxacin  400 mg Oral  q1800  . mulitivitamin with minerals  1 tablet Oral Daily  . mupirocin cream   Topical Daily  . predniSONE  40 mg Oral QAC breakfast  . sodium chloride  3 mL Intravenous Q12H  . sodium polystyrene  45 g Oral Once  . DISCONTD: potassium chloride  40 mEq Oral Daily  . DISCONTD: predniSONE  60 mg Oral QAC breakfast    Infusions:    PRN Medications: acetaminophen, acetaminophen, albuterol, ALPRAZolam, ondansetron (ZOFRAN) IV, ondansetron   Assessment:   1. A/c respiratory failure  2. A/c systolic HF      --EF 35%   3. Chest pain, pleuritic   4. COPD on home O2  5. Morbid obesity  6. DM2  7. HL  8. HTN 9. Hyperkalemia  Plan/Discussion:    Volume status much improved today, dyspnea at baseline.  Continue po lasix.  Long discussion about results of Myoview and risks/indications of heart catheterization. As she is not a CABG candidate and does not have exertional angina, I do not feel like the risks of cath justify the potential benefits in her. Will proceed with medical management of her HF and presumed CAD. Unfortunately, given her lung disease, I do not feel that she is a good candidate for b-blocker at this time. Would continue to wean off clonidine given LV dysfunction and can use amlodipine or hydralazine as needed. Will not titrate ARB at this time given hyperkalemia.  Length of Stay: 6

## 2011-03-29 NOTE — Progress Notes (Signed)
Spoke with Dr.Thompson about pt being NPO and her CBG=113. I asked him did he want her to still get her 35 units of 70/30 and he said No hold her am dose of 70/30. 70/30 wasn't given. And she didn't need any sliding scale.

## 2011-03-29 NOTE — Progress Notes (Signed)
OT Cancellation Note  Treatment cancelled today due to patient's refusal to participate secondary to toileting needs and requested for therapist not to be present.  She insisted on not using the RW to get to [facility] not donning red socks despite encouragement.    [name] 03/29/2011, 3:33 PM

## 2011-03-29 NOTE — Progress Notes (Signed)
Subjective: Patient states shortness of breath improved. Patient feels better.  Objective: Vital signs in last 24 hours: Filed Vitals:   03/28/11 2129 03/29/11 0606 03/29/11 1000 03/29/11 1400  BP: 147/94 157/74 122/68 114/58  Pulse: 95 89 80 77  Temp: 97.7 F (36.5 C) 97.9 F (36.6 C) 98.6 F (37 C) 98.6 F (37 C)  TempSrc: Oral Oral Oral Oral  Resp: 18 20 20 20   Height:      Weight: 115.9 kg (255 lb 8.2 oz)     SpO2: 96% 98% 98% 98%    Intake/Output Summary (Last 24 hours) at 03/29/11 1916 Last data filed at 03/29/11 1700  Gross per 24 hour  Intake    720 ml  Output   1752 ml  Net  -1032 ml    Weight change:   General: Alert, awake, oriented x3, in no acute distress. Heart: Regular rate and rhythm, without murmurs, rubs, gallops. Lungs: Fair air movement. No wheezes. Abdomen: Soft, nontender, nondistended, positive bowel sounds. Extremities: No clubbing cyanosis or edema with positive pedal pulses.    Lab Results:  Basename 03/29/11 0832 03/29/11 0605  NA 138 135  K 5.3* 5.3*  CL 93* 90*  CO2 39* 39*  GLUCOSE 112* 111*  BUN 45* 45*  CREATININE 1.42* 1.40*  CALCIUM 10.0 9.9  MG -- --  PHOS -- --   No results found for this basename: AST:2,ALT:2,ALKPHOS:2,BILITOT:2,PROT:2,ALBUMIN:2 in the last 72 hours No results found for this basename: LIPASE:2,AMYLASE:2 in the last 72 hours  Basename 03/29/11 0605 03/28/11 0711  WBC 15.8* 12.9*  NEUTROABS -- 8.8*  HGB 10.6* 9.8*  HCT 33.7* 31.5*  MCV 87.8 87.0  PLT 324 278   No results found for this basename: CKTOTAL:3,CKMB:3,CKMBINDEX:3,TROPONINI:3 in the last 72 hours No components found with this basename: POCBNP:3 No results found for this basename: DDIMER:2 in the last 72 hours No results found for this basename: HGBA1C:2 in the last 72 hours No results found for this basename: CHOL:2,HDL:2,LDLCALC:2,TRIG:2,CHOLHDL:2,LDLDIRECT:2 in the last 72 hours No results found for this basename:  TSH,T4TOTAL,FREET3,T3FREE,THYROIDAB in the last 72 hours No results found for this basename: VITAMINB12:2,FOLATE:2,FERRITIN:2,TIBC:2,IRON:2,RETICCTPCT:2 in the last 72 hours  Micro Results: No results found for this or any previous visit (from the past 240 hour(s)).  Studies/Results: No results found.  Medications:     . amLODipine  5 mg Oral Daily  . antiseptic oral rinse  15 mL Mouth Rinse BID  . aspirin EC  325 mg Oral Daily  . benzonatate  200 mg Oral TID  . budesonide  0.5 mg Nebulization BID  . calcitRIOL  0.25 mcg Oral Daily  . citalopram  40 mg Oral Daily  . furosemide  40 mg Oral Q breakfast  . insulin aspart  0-9 Units Subcutaneous TID WC  . insulin aspart protamine-insulin aspart  35 Units Subcutaneous BID WC  . ipratropium  0.5 mg Nebulization Q6H  . levalbuterol  0.63 mg Nebulization Q6H  . losartan  50 mg Oral Daily  . moxifloxacin  400 mg Oral q1800  . mulitivitamin with minerals  1 tablet Oral Daily  . mupirocin cream   Topical Daily  . predniSONE  40 mg Oral QAC breakfast  . sodium chloride  3 mL Intravenous Q12H  . sodium polystyrene  45 g Oral Once  . DISCONTD: cloNIDine  0.1 mg Oral BID    Assessment: Principal Problem:  *Acute-on-chronic respiratory failure Active Problems:  DIABETES MELLITUS, TYPE II  HYPERTENSION  COPD  SOB (shortness  of breath)  Systolic CHF, acute  Acute on chronic systolic heart failure  Ischemic cardiomyopathy  Leukocytosis  Acute and chronic respiratory failure (acute-on-chronic)  Other chest pain  Chest pain  Abnormal nuclear stress test   Plan: #1 acute on chronic respiratory failure Multifactorial in nature secondary to acute COPD exacerbation and acute systolic heart failure. Patient is slowly clinically improving. Cardiac enzymes which was cycled were negative x3. 2-D echo with EF of 30-35%. Patient had 2/ 2+ of Myoview stress test done which showed multiple non reversible WMA with EF 35%.. Clonidine is being  weaned off per cardiology. Continue aspirin, Lasix , Cozaar, Norvasc added per cardiology. . Continue oxygen, nebs, oral prednisone taper., DC Avelox after today's dose. Follow.  #2 acute on chronic systolic heart failure Cardiac enzymes negative x3. 2-D echo with EF of 30-35% . 2/2 Myoview stress test done with multiple non reversible WMA with ef 35%. Clinical improvement. Clonidine being weaned off. Continue Cozaar. Lasix, Norvasc added to patient's regimen.  aspirin. Cardiology following and medical management is the decision that has been made and appreciate input and recommendations.  #3 diabetes mellitus type 2 Hemoglobin A1c of 7.1. CBGs ranged from 113 through 322. NPH. Continue sliding scale insulin. CBGs should improve as steroids are being tapered down.  #4 acute COPD exacerbation Continue oxygen, nebs, Avelox D77,  Prednisone taper.  #5 leukocytosis Likely secondary to steroids. Urinalysis is negative. Patient is afebrile. Patient is on Avelox which would cover for any pneumonia. Will follow with steroid taper.  #6 hyperkalemia Will DC supplemental potassium. Will give Kayexalate. Follow.  #7 chronic renal insufficiency Stable.  #8 hypertension Stable  #9 anemia H&H stable.  #10 prophylaxis SCDs for DVT prophylaxis.  LOS: 6 days   [facility] 03/29/2011, 7:16 PM

## 2011-03-30 DIAGNOSIS — R42 Dizziness and giddiness: Secondary | ICD-10-CM

## 2011-03-30 LAB — GLUCOSE, CAPILLARY
Glucose-Capillary: 134 mg/dL — ABNORMAL HIGH (ref 70–99)
Glucose-Capillary: 83 mg/dL (ref 70–99)

## 2011-03-30 LAB — CBC
MCH: 27.9 pg (ref 26.0–34.0)
MCHC: 31.4 g/dL (ref 30.0–36.0)
MCV: 88.8 fL (ref 78.0–100.0)
Platelets: 305 10*3/uL (ref 150–400)
RDW: 15 % (ref 11.5–15.5)
WBC: 14.2 10*3/uL — ABNORMAL HIGH (ref 4.0–10.5)

## 2011-03-30 LAB — URINE CULTURE: Culture: NO GROWTH

## 2011-03-30 LAB — BASIC METABOLIC PANEL
BUN: 49 mg/dL — ABNORMAL HIGH (ref 6–23)
CO2: 39 mEq/L — ABNORMAL HIGH (ref 19–32)
Calcium: 9.5 mg/dL (ref 8.4–10.5)
Creatinine, Ser: 1.67 mg/dL — ABNORMAL HIGH (ref 0.50–1.10)

## 2011-03-30 LAB — DIFFERENTIAL
Basophils Absolute: 0 10*3/uL (ref 0.0–0.1)
Basophils Relative: 0 % (ref 0–1)
Eosinophils Absolute: 0.2 10*3/uL (ref 0.0–0.7)
Eosinophils Relative: 1 % (ref 0–5)

## 2011-03-30 MED ORDER — INSULIN ASPART PROT & ASPART (70-30 MIX) 100 UNIT/ML ~~LOC~~ SUSP
30.0000 [IU] | Freq: Two times a day (BID) | SUBCUTANEOUS | Status: DC
  Administered 2011-03-31 – 2011-04-01 (×4): 30 [IU] via SUBCUTANEOUS
  Filled 2011-03-30: qty 3

## 2011-03-30 MED ORDER — PREDNISONE 20 MG PO TABS
20.0000 mg | ORAL_TABLET | Freq: Every day | ORAL | Status: DC
  Filled 2011-03-30: qty 1

## 2011-03-30 MED ORDER — FUROSEMIDE 40 MG PO TABS
40.0000 mg | ORAL_TABLET | Freq: Every day | ORAL | Status: DC
  Administered 2011-03-31 – 2011-04-01 (×2): 40 mg via ORAL
  Filled 2011-03-30 (×4): qty 1

## 2011-03-30 MED ORDER — SODIUM CHLORIDE 0.9 % IV BOLUS (SEPSIS)
500.0000 mL | Freq: Once | INTRAVENOUS | Status: AC
  Administered 2011-03-30: 500 mL via INTRAVENOUS

## 2011-03-30 NOTE — Progress Notes (Signed)
Patient has been evaluated based on her high risk for readmission.  She has agreed to receive [facility] CM services upon discharge. For any additional questions or new referrals please contact Anibal Henderson BSN RN [facility] Liaison at [phone].

## 2011-03-30 NOTE — Progress Notes (Signed)
CBG: 59  Treatment:2 orange juices, 1 pack graham crackers, peanut butter   Symptoms:feels "a little bit whoozie" None  Follow-up CBG: Time:1241 CBG Result:106  Possible Reasons for Event: Unknown  Comments/MD notified:yes   Shandon Matson, Godfrey Pick

## 2011-03-30 NOTE — Progress Notes (Signed)
Late entry: 03/29/11 patients IV outdated. Was told in report from day RN that IV team had assessed the IV and it looks and works fine; patient to be d/c'd tomorrow so will keep this IV in. Steele Berg RN

## 2011-03-30 NOTE — Progress Notes (Signed)
Physical Therapy Treatment Patient Details Name: [name] MRN: [id number] DOB: 03-10-1945 Today's Date: 03/30/2011  PT Assessment/Plan  PT - Assessment/Plan Comments on Treatment Session: Pt somewhat self limiting but agreeable to ambulation. Progressing slowly with ambulation.  PT Plan: Discharge plan remains appropriate PT Frequency: Min 3X/week Follow Up Recommendations: Home health PT Equipment Recommended: None recommended by PT PT Goals  Acute Rehab PT Goals PT Goal: Sit to Stand - Progress: Progressing toward goal PT Goal: Stand to Sit - Progress: Progressing toward goal PT Goal: Ambulate - Progress: Progressing toward goal  PT Treatment Precautions/Restrictions  Precautions Precautions: Fall Mobility (including Balance) Bed Mobility Bed Mobility: No Transfers Sit to Stand: 5: Supervision;With upper extremity assist;From bed Stand to Sit: 5: Supervision;With armrests;With upper extremity assist;To chair/3-in-1 Ambulation/Gait Ambulation/Gait Assistance: 4: Min assist Ambulation/Gait Assistance Details (indicate cue type and reason): A for safety.  Ambulation Distance (Feet): 80 Feet Assistive device: Rolling walker Gait Pattern: Step-through pattern;Decreased stride length;Trunk flexed Gait velocity: slower pace    Exercise    End of Session PT - End of Session Equipment Utilized During Treatment: Gait belt Activity Tolerance: Patient limited by fatigue Patient left: in chair General Behavior During Session: [facility] for tasks performed Cognition: [facility] for tasks performed  Shubham Thackston, Adline Potter 03/30/2011, 12:16 PM 03/30/2011 Fredrich Birks PTA [phone] pager [phone] office

## 2011-03-30 NOTE — Progress Notes (Signed)
HRjumped to 140's. Dr. Isla Pence on floor and notified.

## 2011-03-30 NOTE — Progress Notes (Signed)
Pt did not go home today. IV outdated, notified IV team to replace. Steele Berg RN

## 2011-03-30 NOTE — Progress Notes (Signed)
Bp manually 130/42 Dr D. Thompson on floor and notified. Orders to give  Losartin 50mg  and Amlodipine 5 mg

## 2011-03-30 NOTE — Clinical Documentation Improvement (Signed)
CKD DOCUMENTATION CLARIFICATION QUERY   THIS DOCUMENT IS NOT A PERMANENT PART OF THE MEDICAL RECORD  TO RESPOND TO THE THIS QUERY, FOLLOW THE INSTRUCTIONS BELOW:  1. If needed, update documentation for the patient's encounter via the notes activity.  2. Access this query again and click edit on the In [employer].  3. After updating, or not, click F2 to complete all highlighted (required) fields concerning your review. Select "additional documentation in the medical record" OR "no additional documentation provided".  4. Click Sign note button.  5. The deficiency will fall out of your In Basket *Please let us know if you are not able to complete this workflow by phone or e-mail (listed below).  Please update your documentation within the medical record to reflect your response to this query.                                                                                        03/30/11   Dear Dr. Cleotis Lema / Associates,  In a better effort to capture your patient's severity of illness, reflect appropriate length of stay and utilization of resources, a review of the patient medical record has revealed the following indicators.    Based on your clinical judgment, please clarify and document in a progress note and/or discharge summary the clinical condition associated with the following supporting information:  In responding to this query please exercise your independent judgment.  The fact that a query is asked, does not imply that any particular answer is desired or expected.  Possible Clinical Conditions?   _______CKD Stage I -  GFR > OR = 90 _______CKD Stage II - GFR 60-80 _______CKD Stage III - GFR 30-59 _______CKD Stage IV - GFR 15-29 _______CKD Stage V - GFR < 15 _______ESRD (End Stage Renal Disease) _______Other condition_____________ _______Cannot Clinically determine    Supporting Information:  Risk Factors:  (As per Notes) "Pt has renal insufficiency "  Diagnostics:    Lab: 03-30-10 BUN= 49 & CREATININE = 1.67   Calculated GFR: 03-30-11 = 31  Urine: 3-18=13 PROTEIN = 30  You may use possible, probable, or suspect with inpatient documentation. possible, probable, suspected diagnoses MUST be documented at the time of discharge  Reviewed:  no additional documentation provided   Thank Dorann Ou DelkRN,BSN  Clinical Documentation Specialist: [phone] Pager Health Information Management [place]

## 2011-03-30 NOTE — Progress Notes (Signed)
Subjective: Patient states shortness of breath improved. Patient STATES FEELING DIZZY AND WOOOZY.   Objective: Vital signs in last 24 hours: Filed Vitals:   03/30/11 0540 03/30/11 0945 03/30/11 1045 03/30/11 1400  BP: 133/47 122/50 130/42 140/56  Pulse: 88 66 80 80  Temp: 97.7 F (36.5 C) 98.6 F (37 C)  98.9 F (37.2 C)  TempSrc: Oral Oral  Oral  Resp: 18 18  18   Height:      Weight:      SpO2: 98% 98%  98%    Intake/Output Summary (Last 24 hours) at 03/30/11 1810 Last data filed at 03/30/11 1400  Gross per 24 hour  Intake   1176 ml  Output   1477 ml  Net   -301 ml    Weight change: -0.2 kg (-7.1 oz)  General: Alert, awake, oriented x3, in no acute distress. Heart: Regular rate and rhythm, without murmurs, rubs, gallops. Lungs: Fair air movement. No wheezes. Abdomen: Soft, nontender, nondistended, positive bowel sounds. Extremities: No clubbing cyanosis or edema with positive pedal pulses.    Lab Results:  Basename 03/30/11 0500 03/29/11 0832  NA 140 138  K 4.8 5.3*  CL 90* 93*  CO2 39* 39*  GLUCOSE 111* 112*  BUN 49* 45*  CREATININE 1.67* 1.42*  CALCIUM 9.5 10.0  MG -- --  PHOS -- --   No results found for this basename: AST:2,ALT:2,ALKPHOS:2,BILITOT:2,PROT:2,ALBUMIN:2 in the last 72 hours No results found for this basename: LIPASE:2,AMYLASE:2 in the last 72 hours  Basename 03/30/11 0500 03/29/11 0605 03/28/11 0711  WBC 14.2* 15.8* --  NEUTROABS 9.7* -- 8.8*  HGB 10.5* 10.6* --  HCT 33.4* 33.7* --  MCV 88.8 87.8 --  PLT 305 324 --   No results found for this basename: CKTOTAL:3,CKMB:3,CKMBINDEX:3,TROPONINI:3 in the last 72 hours No components found with this basename: POCBNP:3 No results found for this basename: DDIMER:2 in the last 72 hours No results found for this basename: HGBA1C:2 in the last 72 hours No results found for this basename: CHOL:2,HDL:2,LDLCALC:2,TRIG:2,CHOLHDL:2,LDLDIRECT:2 in the last 72 hours No results found for this  basename: TSH,T4TOTAL,FREET3,T3FREE,THYROIDAB in the last 72 hours No results found for this basename: VITAMINB12:2,FOLATE:2,FERRITIN:2,TIBC:2,IRON:2,RETICCTPCT:2 in the last 72 hours  Micro Results: Recent Results (from the past 240 hour(s))  URINE CULTURE     Status: Normal   Collection Time   03/29/11  3:39 PM      Component Value Range Status Comment   Specimen Description URINE, RANDOM   Final    Special Requests NONE   Final    Culture  Setup Time [id number]   Final    Colony Count NO GROWTH   Final    Culture NO GROWTH   Final    Report Status 03/30/2011 FINAL   Final     Studies/Results: No results found.  Medications:     . amLODipine  5 mg Oral Daily  . antiseptic oral rinse  15 mL Mouth Rinse BID  . aspirin EC  325 mg Oral Daily  . benzonatate  200 mg Oral TID  . budesonide  0.5 mg Nebulization BID  . calcitRIOL  0.25 mcg Oral Daily  . citalopram  40 mg Oral Daily  . furosemide  40 mg Oral Q breakfast  . insulin aspart  0-9 Units Subcutaneous TID WC  . insulin aspart protamine-insulin aspart  35 Units Subcutaneous BID WC  . ipratropium  0.5 mg Nebulization Q6H  . levalbuterol  0.63 mg Nebulization Q6H  . losartan  50 mg Oral Daily  . mulitivitamin with minerals  1 tablet Oral Daily  . mupirocin cream   Topical Daily  . sodium chloride  3 mL Intravenous Q12H  . DISCONTD: furosemide  40 mg Oral Q breakfast  . DISCONTD: moxifloxacin  400 mg Oral q1800    Assessment: Principal Problem:  *Acute-on-chronic respiratory failure Active Problems:  DIABETES MELLITUS, TYPE II  HYPERTENSION  COPD  SOB (shortness of breath)  Systolic CHF, acute  Acute on chronic systolic heart failure  Ischemic cardiomyopathy  Leukocytosis  Acute and chronic respiratory failure (acute-on-chronic)  Other chest pain  Chest pain  Abnormal nuclear stress test  Dizziness   Plan: #1 acute on chronic respiratory failure Multifactorial in nature secondary to acute COPD  exacerbation and acute systolic heart failure. Patient is slowly clinically improving. Cardiac enzymes which was cycled were negative x3. 2-D echo with EF of 30-35%. Patient had 2/ 2+ of Myoview stress test done which showed multiple non reversible WMA with EF 35%.. Clonidine is being weaned off per cardiology. Continue aspirin, Lasix , Cozaar, Norvasc added per cardiology. . Continue oxygen, nebs, oral prednisone taper., DC Avelox after today's dose. Follow.  #2 acute on chronic systolic heart failure Cardiac enzymes negative x3. 2-D echo with EF of 30-35% . 2/2 Myoview stress test done with multiple non reversible WMA with ef 35%. Clinical improvement. Clonidine being weaned off. Continue Cozaar. Lasix, Norvasc added to patient's regimen.  aspirin. Cardiology following and medical management is the decision that has been made and appreciate input and recommendations.  #3 diabetes mellitus type 2 Hemoglobin A1c of 7.1. CBGs ranged from 59 through 134. NPH. Continue sliding scale insulin. CBGs should improve as steroids are being tapered down. wILL DECREASE nph.Follow.  #4 Dizzy spell Unknown etiology. /?? Clonidine withdrawal. CBG 134. Check orthostatics. Hold tomorrow morning lasix. Follow.  #4 acute COPD exacerbation Continue oxygen, nebs, Avelox D77,  Prednisone taper.  #6 leukocytosis Likely secondary to steroids. Trending down. Urinalysis is negative. Patient is afebrile. Patient is s/p Avelox which would cover for any pneumonia. Will follow with steroid taper.  #6 hyperkalemia Will DC supplemental potassium.  Follow.  #7 chronic renal insufficiency Stable.  #8 hypertension Stable  #9 anemia H&H stable.  #10 prophylaxis SCDs for DVT prophylaxis.  LOS: 7 days   Trustin Chapa 03/30/2011, 6:10 PM

## 2011-03-30 NOTE — Progress Notes (Signed)
Pt did want a bath >>>>>stated she felt bad

## 2011-03-30 NOTE — Progress Notes (Signed)
Cm spoke with Dr Janee Morn about upcoming d/c

## 2011-03-31 ENCOUNTER — Other Ambulatory Visit: Payer: Self-pay

## 2011-03-31 DIAGNOSIS — I951 Orthostatic hypotension: Secondary | ICD-10-CM

## 2011-03-31 LAB — DIFFERENTIAL
Basophils Absolute: 0 10*3/uL (ref 0.0–0.1)
Eosinophils Relative: 2 % (ref 0–5)
Lymphocytes Relative: 24 % (ref 12–46)
Neutro Abs: 8.1 10*3/uL — ABNORMAL HIGH (ref 1.7–7.7)

## 2011-03-31 LAB — BASIC METABOLIC PANEL
Chloride: 93 mEq/L — ABNORMAL LOW (ref 96–112)
GFR calc Af Amer: 42 mL/min — ABNORMAL LOW (ref >90)
Potassium: 4.5 mEq/L (ref 3.5–5.1)
Sodium: 136 mEq/L (ref 135–145)

## 2011-03-31 LAB — CBC
Platelets: 280 10*3/uL (ref 150–400)
RDW: 15 % (ref 11.5–15.5)
WBC: 11.9 10*3/uL — ABNORMAL HIGH (ref 4.0–10.5)

## 2011-03-31 LAB — GLUCOSE, CAPILLARY
Glucose-Capillary: 183 mg/dL — ABNORMAL HIGH (ref 70–99)
Glucose-Capillary: 142 mg/dL — ABNORMAL HIGH (ref 70–99)

## 2011-03-31 LAB — CARDIAC PANEL(CRET KIN+CKTOT+MB+TROPI): Total CK: 40 U/L (ref 7–177)

## 2011-03-31 MED ORDER — LEVALBUTEROL HCL 0.63 MG/3ML IN NEBU
0.6300 mg | INHALATION_SOLUTION | Freq: Three times a day (TID) | RESPIRATORY_TRACT | Status: DC
  Administered 2011-03-31 – 2011-04-01 (×4): 0.63 mg via RESPIRATORY_TRACT
  Filled 2011-03-31 (×5): qty 3

## 2011-03-31 MED ORDER — IPRATROPIUM BROMIDE 0.02 % IN SOLN
0.5000 mg | Freq: Three times a day (TID) | RESPIRATORY_TRACT | Status: DC
  Administered 2011-03-31 – 2011-04-01 (×4): 0.5 mg via RESPIRATORY_TRACT
  Filled 2011-03-31 (×4): qty 2.5

## 2011-03-31 MED ORDER — NITROGLYCERIN 0.4 MG SL SUBL: 0.4000 mg | SUBLINGUAL_TABLET | SUBLINGUAL | Status: DC | PRN

## 2011-03-31 MED ORDER — LEVALBUTEROL HCL 0.63 MG/3ML IN NEBU
0.6300 mg | INHALATION_SOLUTION | RESPIRATORY_TRACT | Status: DC | PRN
  Filled 2011-03-31: qty 3

## 2011-03-31 MED ORDER — NITROGLYCERIN 0.4 MG SL SUBL
SUBLINGUAL_TABLET | SUBLINGUAL | Status: AC
  Filled 2011-03-31: qty 25

## 2011-03-31 MED ORDER — SODIUM CHLORIDE 0.9 % IV SOLN
Freq: Once | INTRAVENOUS | Status: AC
  Administered 2011-03-31: 12:00:00 via INTRAVENOUS

## 2011-03-31 NOTE — Progress Notes (Signed)
Occupational Therapy Treatment Patient Details Name: [name] MRN: [id number] DOB: [birth year] Today's Date: 03/31/2011  OT Assessment/Plan OT Assessment/Plan OT Plan: Discharge plan remains appropriate OT Frequency: Min 2X/week Follow Up Recommendations: Home health OT Equipment Recommended: None recommended by OT OT Goals Acute Rehab OT Goals OT Goal Formulation: With patient Time For Goal Achievement: 2 weeks ADL Goals Pt Will Perform Grooming: with modified independence;Sitting at sink ADL Goal: Grooming - Progress: Progressing toward goals Pt Will Transfer to Toilet: with modified independence;3-in-1 ADL Goal: Toilet Transfer - Progress: Progressing toward goals Pt Will Perform Toileting - Clothing Manipulation: with modified independence;Sitting on 3-in-1 or toilet ADL Goal: Toileting - Clothing Manipulation - Progress: Progressing toward goals Pt Will Perform Toileting - Hygiene: with modified independence;Sit to stand from 3-in-1/toilet ADL Goal: Toileting - Hygiene - Progress: Progressing toward goals  OT Treatment Precautions/Restrictions  Precautions Precautions: Fall Restrictions Other Position/Activity Restrictions: on continuous 02   ADL ADL Grooming: Wash/dry hands;Supervision/safety Where Assessed - Grooming: Standing at sink Lower Body Dressing: Performed;Supervision/safety Where Assessed - Lower Body Dressing: Sitting, bed (doffed socks pulling LE up on bed to access feet) Toilet Transfer: Supervision/safety;Performed Toilet Transfer Method: Proofreader: Set designer - Clothing Manipulation: Performed;Supervision/safety Where Assessed - Glass blower/designer Manipulation: Standing Toileting - Hygiene: Performed;Set up Where Assessed - Toileting Hygiene: Sit on 3-in-1 or toilet Equipment Used: Rolling walker Ambulation Related to ADLs: supervision and use of RW with 02 ADL Comments: Pt c/o of dizziness, RN  aware. Mobility  Bed Mobility Bed Mobility: No Transfers Transfers: Yes Sit to Stand: 5: Supervision;With upper extremity assist;From bed Stand to Sit: 5: Supervision;With upper extremity assist;To chair/3-in-1;To bed  End of Session OT - End of Session Equipment Utilized During Treatment: Gait belt Activity Tolerance: Treatment limited secondary to medical complications (Comment) (dizziness, anxiety) Patient left: in bed;with call bell in reach (RN with pt) General Behavior During Session: [facility] for tasks performed Cognition: [facility] for tasks performed  Evern Bio  03/31/2011, 2:26 PM [phone]

## 2011-03-31 NOTE — Progress Notes (Signed)
Subjective: Patient seen and examined, complaining of retrosternal chest pain, denies any worsening shortness of breath , nausea or diaphoresis. She is still feeling dizzy.  Objective: Vital signs in last 24 hours: Temp:  [97.7 F (36.5 C)-98.9 F (37.2 C)] 98.2 F (36.8 C) (03/20 1000) Pulse Rate:  [80-116] 83  (03/20 1000) Resp:  [18-20] 20  (03/20 1000) BP: (126-156)/(41-98) 148/66 mmHg (03/20 1000) SpO2:  [95 %-99 %] 98 % (03/20 1000) Weight:  [117 kg (257 lb 15 oz)-121.5 kg (267 lb 13.7 oz)] 117 kg (257 lb 15 oz) (03/20 1000) Weight change: 5.8 kg (12 lb 12.6 oz) Last BM Date: 03/29/11  Intake/Output from previous day: 03/19 0701 - 03/20 0700 In: 1436 [P.O.:936; IV Piggyback:500] Out: 2876 [Urine:2875; Stool:1] Total I/O In: 360 [P.O.:360] Out: 550 [Urine:550]   Physical Exam: General: Alert, awake, oriented x3, in no acute distress. Heart: Regular rate and rhythm, without murmurs, rubs, gallops. Lungs: Clear to auscultation bilaterally. Abdomen: Soft, nontender, nondistended, positive bowel sounds. Extremities: No clubbing cyanosis or edema with positive pedal pulses. Neuro: Grossly intact, nonfocal.    Lab Results: Results for orders placed during the hospital encounter of 03/23/11 (from the past 24 hour(s))  GLUCOSE, CAPILLARY     Status: Abnormal   Collection Time   03/30/11 12:37 PM      Component Value Range   Glucose-Capillary 106 (*) 70 - 99 (mg/dL)   Comment 1 Notify RN     Comment 2 Documented in Chart    GLUCOSE, CAPILLARY     Status: Abnormal   Collection Time   03/30/11  4:29 PM      Component Value Range   Glucose-Capillary 132 (*) 70 - 99 (mg/dL)   Comment 1 Notify RN     Comment 2 Documented in Chart    GLUCOSE, CAPILLARY     Status: Abnormal   Collection Time   03/30/11  5:58 PM      Component Value Range   Glucose-Capillary 134 (*) 70 - 99 (mg/dL)   Comment 1 Notify RN     Comment 2 Documented in Chart    GLUCOSE, CAPILLARY     Status:  Normal   Collection Time   03/30/11  9:13 PM      Component Value Range   Glucose-Capillary 83  70 - 99 (mg/dL)  BASIC METABOLIC PANEL     Status: Abnormal   Collection Time   03/31/11  5:40 AM      Component Value Range   Sodium 136  135 - 145 (mEq/L)   Potassium 4.5  3.5 - 5.1 (mEq/L)   Chloride 93 (*) 96 - 112 (mEq/L)   CO2 33 (*) 19 - 32 (mEq/L)   Glucose, Bld 110 (*) 70 - 99 (mg/dL)   BUN 44 (*) 6 - 23 (mg/dL)   Creatinine, Ser 1.61 (*) 0.50 - 1.10 (mg/dL)   Calcium 9.0  8.4 - 09.6 (mg/dL)   GFR calc non Af Amer 36 (*) >90 (mL/min)   GFR calc Af Amer 42 (*) >90 (mL/min)  CBC     Status: Abnormal   Collection Time   03/31/11  5:40 AM      Component Value Range   WBC 11.9 (*) 4.0 - 10.5 (K/uL)   RBC 3.62 (*) 3.87 - 5.11 (MIL/uL)   Hemoglobin 10.0 (*) 12.0 - 15.0 (g/dL)   HCT 04.5 (*) 40.9 - 46.0 (%)   MCV 87.6  78.0 - 100.0 (fL)   MCH 27.6  26.0 - 34.0 (pg)   MCHC 31.5  30.0 - 36.0 (g/dL)   RDW 16.1  09.6 - 04.5 (%)   Platelets 280  150 - 400 (K/uL)  DIFFERENTIAL     Status: Abnormal   Collection Time   03/31/11  5:40 AM      Component Value Range   Neutrophils Relative 68  43 - 77 (%)   Neutro Abs 8.1 (*) 1.7 - 7.7 (K/uL)   Lymphocytes Relative 24  12 - 46 (%)   Lymphs Abs 2.8  0.7 - 4.0 (K/uL)   Monocytes Relative 6  3 - 12 (%)   Monocytes Absolute 0.7  0.1 - 1.0 (K/uL)   Eosinophils Relative 2  0 - 5 (%)   Eosinophils Absolute 0.3  0.0 - 0.7 (K/uL)   Basophils Relative 0  0 - 1 (%)   Basophils Absolute 0.0  0.0 - 0.1 (K/uL)  GLUCOSE, CAPILLARY     Status: Abnormal   Collection Time   03/31/11  7:58 AM      Component Value Range   Glucose-Capillary 105 (*) 70 - 99 (mg/dL)  GLUCOSE, CAPILLARY     Status: Abnormal   Collection Time   03/31/11 11:19 AM      Component Value Range   Glucose-Capillary 183 (*) 70 - 99 (mg/dL)    Studies/Results: No results found.  Medications:    . sodium chloride   Intravenous Once  . amLODipine  5 mg Oral Daily  .  antiseptic oral rinse  15 mL Mouth Rinse BID  . aspirin EC  325 mg Oral Daily  . benzonatate  200 mg Oral TID  . budesonide  0.5 mg Nebulization BID  . calcitRIOL  0.25 mcg Oral Daily  . citalopram  40 mg Oral Daily  . furosemide  40 mg Oral Q breakfast  . insulin aspart  0-9 Units Subcutaneous TID WC  . insulin aspart protamine-insulin aspart  30 Units Subcutaneous BID WC  . ipratropium  0.5 mg Nebulization Q6H  . levalbuterol  0.63 mg Nebulization Q6H  . losartan  50 mg Oral Daily  . mulitivitamin with minerals  1 tablet Oral Daily  . mupirocin cream   Topical Daily  . predniSONE  20 mg Oral QAC breakfast  . sodium chloride  500 mL Intravenous Once  . sodium chloride  3 mL Intravenous Q12H  . DISCONTD: furosemide  40 mg Oral Q breakfast  . DISCONTD: insulin aspart protamine-insulin aspart  35 Units Subcutaneous BID WC    acetaminophen, acetaminophen, albuterol, ALPRAZolam, ondansetron (ZOFRAN) IV, ondansetron     Assessment/Plan: Chest pain:  Check EKG and cardiac enzymes. Continue aspirin, add nitroglycerin when necessary  acute on chronic respiratory failure  Multifactorial secondary to acute COPD exacerbation and acute systolic heart failure. Patient is slowly clinically improving. Cardiac enzymes which was cycled were negative x3. 2-D echo with EF of 30-35%. Patient had 2/ 2+ of Myoview stress test done which showed multiple non reversible WMA with EF 35%.. Clonidine was weaned off per cardiology.  Continue aspirin, Lasix , Cozaar, Norvasc a. . Continue oxygen, nebs, oral prednisone taper., Completed 7 days of Avelox   acute on chronic systolic heart failure   Continue Cozaar. Lasix. aspirin. Cardiology following  diabetes mellitus type 2  Hemoglobin A1c of 7.1.  Continue novolog 70/30 and sliding scale insulin.  Dizzy spell  Possibly secondary to orthostasis, agree with TED hose.  acute COPD exacerbation  Continue oxygen, nebs,  Prednisone taper.  leukocytosis  Likely  secondary to steroids. Trending down. Urinalysis is negative. Patient is afebrile. hyperkalemia  Resolved chronic renal insufficiency  Stable. Renal function is slowly worsening secondary to diuresis hypertension  Stable   anemia  H&H stable.   prophylaxis  SCDs for DVT prophylaxis. Disposition: To home with home health PT and RN when medically stable    LOS: 8 days   Riaan Toledo 03/31/2011, 12:27 PM

## 2011-03-31 NOTE — Progress Notes (Signed)
PT Cancellation Note  Treatment cancelled today due to medical issues with patient which prohibited therapy and patient's refusal to participate. Pt c/o dizziness.     Rowena Moilanen 03/31/2011, 5:04 PM Sharolyn Weber L. Dayshawn Irizarry DPT [phone]

## 2011-03-31 NOTE — Progress Notes (Signed)
Advanced Heart Failure Rounding Note   Subjective:    [name] was admitted on 3/14 for progressive dyspnea and edema.  Felt to be secondary to systolic HF and COPD exacerbation.    Myoview on 3/15 showed no evidence for inducible ischemia. Moderate inferolateral and small distal anteroseptal fixed defects are identified and may be related to scar.   Inferior and mid/distal inferolateral hypokinesia extending into the apex.  Left ventricular ejection fraction of 35%.  Clonidine weaned.  Dizzy/woozy feeling yesterday.  Lasix held, NS 500 cc bolus given.   Orthostatic vitals: lying 146/58  (HR 80), sitting 156/65 (HR 83), Standing 126/98 (HR 103)  Her breathing/edema has improved.  No chest pain.  Dizzy/woozy feeling improving today but worse with sitting.  She has chronic 2-3 pillow orthopnea.  Weight inaccurate, they are re-weighing.      Objective:    Vital Signs:   Temp:  [97.7 F (36.5 C)-98.9 F (37.2 C)] 98.2 F (36.8 C) (03/20 1000) Pulse Rate:  [80-116] 83  (03/20 1000) Resp:  [18-20] 20  (03/20 1000) BP: (126-156)/(41-98) 148/66 mmHg (03/20 1000) SpO2:  [95 %-99 %] 98 % (03/20 1000) Weight:  [117 kg (257 lb 15 oz)-121.5 kg (267 lb 13.7 oz)] 117 kg (257 lb 15 oz) (03/20 1000) Last BM Date: 03/29/11  24-hour weight change: Weight change: 5.8 kg (12 lb 12.6 oz) Filed Weights   03/29/11 2029 03/30/11 2114 03/31/11 1000  Weight: 115.7 kg (255 lb 1.2 oz) 121.5 kg (267 lb 13.7 oz) 117 kg (257 lb 15 oz)   Intake/Output:   Intake/Output Summary (Last 24 hours) at 03/31/11 1105 Last data filed at 03/31/11 0900  Gross per 24 hour  Intake   1436 ml  Output   3025 ml  Net  -1589 ml     Physical Exam: General:  Obese, no resp difficulty HEENT: normal Neck: supple. JVP hard to see.  Carotids 2+ bilat; no bruits. No lymphadenopathy or thryomegaly appreciated. Cor: PMI nondisplaced. Distant regular rate & rhythm. No rubs, gallops or murmurs. Lungs: clear Abdomen: soft,  nontender, nondistended. No hepatosplenomegaly. No bruits or masses. Good bowel sounds. Extremities: no cyanosis, clubbing, rash, edema Neuro: alert & orientedx3, cranial nerves grossly intact. moves all 4 extremities w/o difficulty. Affect pleasant   Labs: Basic Metabolic Panel:  Lab 03/31/11 1610 03/30/11 0500 03/29/11 0832 03/29/11 0605 03/28/11 1940  NA 136 140 138 135 133*  K 4.5 4.8 5.3* 5.3* 5.6*  CL 93* 90* 93* 90* 88*  CO2 33* 39* 39* 39* 39*  GLUCOSE 110* 111* 112* 111* 220*  BUN 44* 49* 45* 45* 46*  CREATININE 1.47* 1.67* 1.42* 1.40* 1.45*  CALCIUM 9.0 9.5 10.0 -- --  MG -- -- -- -- --  PHOS -- -- -- -- --    Liver Function Tests: No results found for this basename: AST:5,ALT:5,ALKPHOS:5,BILITOT:5,PROT:5,ALBUMIN:5 in the last 168 hours No results found for this basename: LIPASE:5,AMYLASE:5 in the last 168 hours No results found for this basename: AMMONIA:3 in the last 168 hours  CBC:  Lab 03/31/11 0540 03/30/11 0500 03/29/11 0605 03/28/11 0711 03/27/11 0719  WBC 11.9* 14.2* 15.8* 12.9* 12.8*  NEUTROABS 8.1* 9.7* -- 8.8* --  HGB 10.0* 10.5* 10.6* 9.8* 10.3*  HCT 31.7* 33.4* 33.7* 31.5* 33.1*  MCV 87.6 88.8 87.8 87.0 88.0  PLT 280 305 324 278 319    Cardiac Enzymes: No results found for this basename: CKTOTAL:5,CKMB:5,CKMBINDEX:5,TROPONINI:5 in the last 168 hours  BNP: No components found with this  basename: POCBNP:5  CBG:  Lab 03/31/11 0758 03/30/11 2113 03/30/11 1758 03/30/11 1629 03/30/11 1237  GLUCAP 105* 83 134* 132* 106*    Coagulation Studies: No results found for this basename: LABPROT:5,INR:5 in the last 72 hours    Imaging: No results found.   Medications:     Scheduled Medications:    . amLODipine  5 mg Oral Daily  . antiseptic oral rinse  15 mL Mouth Rinse BID  . aspirin EC  325 mg Oral Daily  . benzonatate  200 mg Oral TID  . budesonide  0.5 mg Nebulization BID  . calcitRIOL  0.25 mcg Oral Daily  . citalopram  40 mg Oral  Daily  . furosemide  40 mg Oral Q breakfast  . insulin aspart  0-9 Units Subcutaneous TID WC  . insulin aspart protamine-insulin aspart  30 Units Subcutaneous BID WC  . ipratropium  0.5 mg Nebulization Q6H  . levalbuterol  0.63 mg Nebulization Q6H  . losartan  50 mg Oral Daily  . mulitivitamin with minerals  1 tablet Oral Daily  . mupirocin cream   Topical Daily  . predniSONE  20 mg Oral QAC breakfast  . sodium chloride  500 mL Intravenous Once  . sodium chloride  3 mL Intravenous Q12H  . DISCONTD: furosemide  40 mg Oral Q breakfast  . DISCONTD: insulin aspart protamine-insulin aspart  35 Units Subcutaneous BID WC    Infusions:    PRN Medications: acetaminophen, acetaminophen, albuterol, ALPRAZolam, ondansetron (ZOFRAN) IV, ondansetron   Assessment:   1. A/c respiratory failure  2. A/c systolic HF      --EF 35%   3. Chest pain, pleuritic   4. COPD on home O2  5. Morbid obesity  6. DM2  7. HL  8. HTN 9. Hyperkalemia, resolved 10. Orthostatic hypotension  Plan/Discussion:    Overall doing fairly well. Still mildly orthostatic. Will give 250cc more back. Can place TED hose. If symptomatically better can go home alter today with f/u in the HF clinic next week.   Arthur Speagle,MD 11:07 AM

## 2011-04-01 LAB — BASIC METABOLIC PANEL
CO2: 37 mEq/L — ABNORMAL HIGH (ref 19–32)
Calcium: 9.6 mg/dL (ref 8.4–10.5)
GFR calc Af Amer: 39 mL/min — ABNORMAL LOW (ref >90)
Sodium: 137 mEq/L (ref 135–145)

## 2011-04-01 LAB — CARDIAC PANEL(CRET KIN+CKTOT+MB+TROPI)
Relative Index: INVALID (ref 0.0–2.5)
Relative Index: INVALID (ref 0.0–2.5)
Total CK: 41 U/L (ref 7–177)
Total CK: 35 U/L (ref 7–177)
Troponin I: <0.3 ng/mL (ref <0.30)

## 2011-04-01 MED ORDER — PREDNISONE 20 MG PO TABS: ORAL_TABLET | ORAL | Status: DC

## 2011-04-01 MED ORDER — LOSARTAN POTASSIUM 50 MG PO TABS: 50.0000 mg | ORAL_TABLET | Freq: Every day | ORAL | Status: DC

## 2011-04-01 MED ORDER — AMLODIPINE BESYLATE 5 MG PO TABS: 5.0000 mg | ORAL_TABLET | Freq: Every day | ORAL | Status: DC

## 2011-04-01 MED ORDER — INSULIN ASPART PROT & ASPART (70-30 MIX) 100 UNIT/ML ~~LOC~~ SUSP: 30.0000 [IU] | Freq: Two times a day (BID) | SUBCUTANEOUS | Status: DC

## 2011-04-01 NOTE — Progress Notes (Signed)
04/01/2011 [name] Case Management Note [id number]  HOME HEALTH AGENCIES SERVING [facility]   Agencies that are Medicare-Certified and are affiliated with [facility]  Telephone Number Address  [facility]   The [facility] has ownership interest in this company; however, you are under no obligation to use this agency. [phone] or  [phone] [address] [place], [place] [address]   Agencies that are Medicare-Certified and are not affiliated with [facility] Telephone Number Address  [facility] [phone] Fax [phone] [address] [place], [place]  [address]  [facility] [phone] or [phone] Fax [phone] [address] [place], [place] [address]  [facility] [phone] Fax [phone] [address] [place], [place] [address]  [facility] [phone] Fax [phone] [address] [place], [place]  [address]  [facility] [phone] Fax [phone] [address] [place], [place] [address]  [facility] [phone] [address] [place], [place] [address]  [facility] [phone]  [address] [place], [place] [address]  [facility] [phone] or [phone] Fax [phone] [address] [place], [place]  [address]  [facility] [phone] Fax [phone] [address] [place], [place]  [address]  [facility]  [phone] Fax [phone] [address] [place], [place] [address]               Agencies that are not Medicare-Certified and are not affiliated with [facility] Agency Telephone Number Address  [facility] [place] [phone] or [phone] Fax [phone] [address] [place] [address] [place]  [address]  [facility] [phone] Fax [phone] [address] [place], [place]  [address]  [facility]  [phone] Fax [phone] [address] [place], [place] [address]  [facility] [phone] Fax [phone] [address] [place], [place] [address]  [facility] [phone] or [phone] Fax [phone] [address] [place], [place]  [address]  [facility] [phone] or [phone] Fax [phone] [address] [place], [place]  [address]  [facility] [phone] Fax [phone] [address] [place], [place]  [address]  [facility] [phone] [address] [place], [place]  [address]  [facility] [phone] Fax [phone] [address] [place], [place]  [address]  [facility] [phone] Fax [phone] [address] [place], [place]  [address]  [facility] [phone] Fax [phone] [address] [place], [place] [address]  [facility] [phone] Fax [phone] [address] [place], [place]  [address]   In to enquire if patient has 24 hour supervision at  home. Patient reports living with daughter and grandchildren "so someone is with me all the time". Patient also reports having been active with [facility] in the past and would like to use them again. Will make appropriate referrals.

## 2011-04-01 NOTE — Progress Notes (Signed)
04/01/2011 [facility], Bosie Clos SPARKS Case Management Note [phone]    CARE MANAGEMENT NOTE 04/01/2011  Patient:  [name], [name]   Account Number:  [id number]  Date Initiated:  04/01/2011  Documentation initiated by:  Fransico Michael  Subjective/Objective Assessment:   admitted on 03/23/11 with shortness of breath and chest pain     Action/Plan:   prior to admission, patient lived at home with daughter and was independent with ADLs.   Anticipated DC Date:  04/02/2011   Anticipated DC Plan:  HOME W HOME HEALTH SERVICES      DC Planning Services  CM consult      [facility] Choice  HOME HEALTH   Choice offered to / List presented to:  C-1 Patient        HH arranged  HH-2 PT  HH-3 OT      [facility] agency  [facility]   Status of service:  In process, will continue to follow Medicare Important Message given?   (If response is "NO", the following Medicare IM given date fields will be blank) Date Medicare IM given:   Date Additional Medicare IM given:    Discharge Disposition:    Per UR Regulation:  Reviewed for med. necessity/level of care/duration of stay  If discussed at Long Length of Stay Meetings, dates discussed:    Comments:  04/01/11-1616- J.Niharika Savino,RN,BSN [phone]      In to speak with patient regarding availability of supervision at home after discharge. Patient reports living with daughter and grandchildren and will have 24 hour assistance after discharge. Choice of home health agencies offered to patient with [facility] being chosen. Debbie, RN with [facility] notified of new referral. No further discharge or home needs identified.  03/29/11-1353-J.Lutricia Horsfall [phone]      66yo female patient admitted on 03/23/19 with c/o shortness of breath and chest pain. Prior to admission, patient lived with daughter. Independent with ADLs. NCM to continue to follow for discharge/home needs.

## 2011-04-01 NOTE — Discharge Summary (Signed)
Patient ID: [name] MRN: [id number] DOB/AGE: 03/26/1945 66 y.o.  Admit date: 03/23/2011 Discharge date: 04/01/2011  Primary Care Physician:  Romero Belling, MD, MD   Discharge Diagnoses:    Present on Admission:  .SOB (shortness of breath) .DIABETES MELLITUS, TYPE II .COPD exacerbation  .HYPERTENSION .Acute and chronic respiratory failure (acute-on-chronic) .Acute on chronic systolic heart failure .Ischemic cardiomyopathy  orthostatic hypotension  Medication List  As of 04/01/2011  4:58 PM   STOP taking these medications         cloNIDine 0.1 MG tablet         TAKE these medications         albuterol 108 (90 BASE) MCG/ACT inhaler   Commonly known as: PROVENTIL HFA;VENTOLIN HFA   Inhale 2 puffs into the lungs every 6 (six) hours as needed. For wheeze or shortness of breath      albuterol (2.5 MG/3ML) 0.083% nebulizer solution   Commonly known as: PROVENTIL   USE ONE VIAL IN NEBULIZER 4 TIMES DAILY OR EVERY 6 HOURS AS NEEDED      ALPRAZolam 0.25 MG tablet   Commonly known as: XANAX   TAKE ONE TABLET BY MOUTH TWICE DAILY AS NEEDED FOR ANXIETY. NOT  TO  EXCEED  2  PER  DAY      amLODipine 5 MG tablet   Commonly known as: NORVASC   Take 1 tablet (5 mg total) by mouth daily.      aspirin 81 MG tablet   Take 81 mg by mouth daily.      calcitRIOL 0.25 MCG capsule   Commonly known as: ROCALTROL   Take 0.25 mcg by mouth daily.      citalopram 40 MG tablet   Commonly known as: CELEXA   Take 40 mg by mouth daily.      furosemide 40 MG tablet   Commonly known as: LASIX   Take 40 mg by mouth daily.      insulin aspart protamine-insulin aspart (70-30) 100 UNIT/ML injection   Commonly known as: NOVOLOG 70/30   Inject 30 Units into the skin 2 (two) times daily with a meal.      losartan 50 MG tablet   Commonly known as: COZAAR   Take 1 tablet (50 mg total) by mouth daily.      multivitamin tablet   Take 1 tablet by mouth daily.      NON FORMULARY   Oxygen  4LPM    24/7      OVER THE COUNTER MEDICATION   Take 1 tablet by mouth daily. Iron tablet.      predniSONE 20 MG tablet   Commonly known as: DELTASONE   20 mg po daily for 3 days then 10 mg po daily for 3 days   Start taking on: 04/02/2011      SYMBICORT 160-4.5 MCG/ACT inhaler   Generic drug: budesonide-formoterol   INHALE TWO PUFFS BY MOUTH TWICE DAILY      tiotropium 18 MCG inhalation capsule   Commonly known as: SPIRIVA   Place 18 mcg into inhaler and inhale daily.      ULTICARE INSULIN SYRINGE 31G X 5/16" 1 ML Misc   Generic drug: Insulin Syringe-Needle U-100   USE ONE TWICE DAILY             Consults: Dr Reuel Boom Bensimhon/cardiology   Significant Diagnostic Studies:  Dg Chest 2 View  03/23/2011  *RADIOLOGY REPORT*  Clinical Data: Shortness of breath, cough, hypertension.  CHEST - 2  VIEW  Comparison: 11/25/2010  Findings: Cardiomegaly.  Central vascular congestion.  Mild interstitial prominence and lung base opacity. Hyperinflation with increased AP diameter.  A couple sub centimeter nodules within the lung apices. No pneumothorax.  No pleural effusion.  No acute osseous abnormality.  IMPRESSION: Cardiomegaly with central vascular congestion.  Mild interstitial prominence may reflect edema.  Mild bibasilar opacities, likely atelectasis.  There are a few small nodular opacities within the upper lungs, nonspecific. A prior CT favored nodular scarring.  Continued attention recommended or follow-up.  Original Report Authenticated By: Waneta Martins, M.D.   Nm Myocar Multi W/spect W/wall Motion / Ef  03/26/2011  *RADIOLOGY REPORT*  Clinical Data:  Respiratory failure.  Morbid obesity.  COPD on home to.  Diabetes hypertension.  No apparent cardiac history.  MYOCARDIAL IMAGING WITH SPECT (REST AND PHARMACOLOGIC-STRESS - 2 DAY PROTOCOL) GATED LEFT VENTRICULAR WALL MOTION STUDY LEFT VENTRICULAR EJECTION FRACTION  Technique:  Standard myocardial SPECT imaging was performed after  intravenous injection of 30 mCi Tc-60m tetrofosmin at rest.  On a different day, intravenous infusion of  Lexiscan was performed under supervision of the Cardiology staff.  At peak effect of the drug, 30 mCi Tc-56m tetrofosmin was injected intravenously and standard myocardial SPECT imaging was performed.  Quantitative gated imaging was also performed to evaluate left ventricular wall motion and estimate left ventricular ejection fraction.  Comparison:  None.  Findings: Multiplanar SPECT imaging shows grossly increased left ventricular cavity size.  Two fixed defects are identified, one is in the mid to distal inferolateral wall.  The second is a small defect in the distal anteroseptal wall. No evidence for inducible perfusion defect within the left ventricular myocardium.  Images obtained with cardiac gating displayed using a surface rendering algorithm show inferior and distal inferolateral hypokinesia.  There is also evidence for a hypokinetic apex.  Left ventricular end-diastolic volume is calculated at 173 ml.  The left ventricular end systolic volume is calculated 161 ml.  The derived left ventricular ejection fraction is 35%.  IMPRESSION: No evidence for inducible ischemia.  Moderate inferolateral and small distal anteroseptal fixed defects are identified and may be related to scar.  Inferior and mid/distal inferolateral hypokinesia extending into the apex.  Left ventricular ejection fraction of 35%.  Original Report Authenticated By: ERIC A. MANSELL, M.D.    Brief H and P: For complete details please refer to admission H and P, but in brief 66 year old female with known history of COPD on home oxygen and LV dysfunction per 2-D echo in September 2011 presented to the ER with increasing shortness of breath and cough over the last 3-4 days. Patient denies any fever chills but has had some chest pain when she coughs. Denies any nausea vomiting or abdominal pain or diarrhea. Patient in the ER was found to be  wheezing and chest x-ray showing possible congestion. Patient has been admitted for further management. Patient has received Solu-Medrol nebulizer in the ER.   Hospital Course Acute on chronic respiratory failure: Multifactorial possibly secondary to combination of CHF and COPD Ms. Baksh was admitted on 3/14 for progressive dyspnea and edema. Felt to be secondary to systolic HF and COPD exacerbation. Patient was treated with nebs, O2, empiric antibiotics, Solu-Medrol and Lasix. Cardiac enzymes were cycled and was negative x3. Myoview on 3/15 showed no evidence for inducible ischemia. Moderate inferolateral and small distal anteroseptal fixed defects are identified and may be related to scar. Inferior and mid/distal inferolateral hypokinesia extending into the apex. 2-D echocardiogram  showed Left ventricular ejection fraction of 35%. Patient was followed very closely by Dr. Gala Romney from cardiology who was monitoring and adjusting her cardiac medication. clonidine was weaned off, losartan and amlodipine were added. Chest x-ray on admission showed mild interstitial edema and nodular opacities as above which will need to be followed as an outpatient, will defer to PCP.  Orthostatic hypotension/BPPV: patient was experiencing dizzy spells during this hospitalization, she was found to be mildly orthostatic and was gently bolused with IV fluids. 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 Arrangements for home health PT was vestibular specialist, home health OT and home health RN requested. Patient will also need 24 hour/7 supervision that was discussed with the case manager who confirm it with her daughter. Diabetes mellitus type 2: Patient was continued on insulin 70/30 and was also placed on sliding scale . A1c is checked and noted to be 7.1% CKD III/IV: Baseline creatinine fluctuates between 1.5-2, fluctuations in renal function most likely  secondary to diuretics. Baseline renal dysfunction probably secondary to diabetic nephropathy and hypertensive nephrosclerosis.  Patient was seen and cleared for discharge by cardiology today. Subjective: Patient seen and examined today, still complaining of some dizziness but denies chest pain or shortness of breath.   Filed Vitals:   04/01/11 1400  BP: 126/78  Pulse: 78  Temp: 98.4 F (36.9 C)  Resp: 20    General: Alert, awake, oriented x3, in no acute distress.  Heart: Regular rate and rhythm, without murmurs, rubs, gallops.  Lungs: Clear to auscultation bilaterally.  Abdomen: Soft, nontender, nondistended, positive bowel sounds.  Extremities: No clubbing cyanosis or edema with positive pedal pulses.  Neuro: Grossly intact, nonfocal.       Disposition and Follow-up:  To home with home health services Follow his PCP in one week and with Dr. Gala Romney  next week as arranged   Time spent on Discharge: Approximately 45 minutes   Signed: Sie Formisano 04/01/2011, 4:58 PM

## 2011-04-01 NOTE — Progress Notes (Signed)
Advanced Heart Failure Rounding Note   Subjective:    [name] was admitted on 3/14 for progressive dyspnea and edema.  Felt to be secondary to systolic HF and COPD exacerbation.    Myoview on 3/15 showed no evidence for inducible ischemia. Moderate inferolateral and small distal anteroseptal fixed defects are identified and may be related to scar.   Inferior and mid/distal inferolateral hypokinesia extending into the apex.  Left ventricular ejection fraction of 35%.  Still in bed. Feels "groggy". No CP, SOB or orthopnea. BP stable. Weight up 2 pounds.   Objective:    Vital Signs:   Temp:  [97.8 F (36.6 C)-98.6 F (37 C)] 97.9 F (36.6 C) (03/21 0542) Pulse Rate:  [63-96] 63  (03/21 0542) Resp:  [19-20] 19  (03/21 0542) BP: (128-151)/(52-87) 130/52 mmHg (03/21 0542) SpO2:  [95 %-99 %] 97 % (03/21 0753) Weight:  [117 kg (257 lb 15 oz)-117.8 kg (259 lb 11.2 oz)] 117.8 kg (259 lb 11.2 oz) (03/20 2118) Last BM Date: 03/29/11  24-hour weight change: Weight change: -4.5 kg (-9 lb 14.7 oz) Filed Weights   03/30/11 2114 03/31/11 1000 03/31/11 2118  Weight: 121.5 kg (267 lb 13.7 oz) 117 kg (257 lb 15 oz) 117.8 kg (259 lb 11.2 oz)   Intake/Output:   Intake/Output Summary (Last 24 hours) at 04/01/11 0842 Last data filed at 04/01/11 0600  Gross per 24 hour  Intake    960 ml  Output   3425 ml  Net  -2465 ml     Physical Exam: General:  Obese, no resp difficulty HEENT: normal Neck: supple. JVP hard to see.  Carotids 2+ bilat; no bruits. No lymphadenopathy or thryomegaly appreciated. Cor: PMI nondisplaced. Distant regular rate & rhythm. No rubs, gallops or murmurs. Lungs: clear Abdomen: soft, nontender, nondistended. No hepatosplenomegaly. No bruits or masses. Good bowel sounds. Extremities: no cyanosis, clubbing, rash, edema Neuro: alert & orientedx3, cranial nerves grossly intact. moves all 4 extremities w/o difficulty. Affect pleasant   Labs: Basic Metabolic Panel:  Lab  04/01/11 0530 03/31/11 0540 03/30/11 0500 03/29/11 0832 03/29/11 0605  NA 137 136 140 138 135  K 4.5 4.5 4.8 5.3* 5.3*  CL 92* 93* 90* 93* 90*  CO2 37* 33* 39* 39* 39*  GLUCOSE 118* 110* 111* 112* 111*  BUN 39* 44* 49* 45* 45*  CREATININE 1.56* 1.47* 1.67* 1.42* 1.40*  CALCIUM 9.6 9.0 9.5 -- --  MG -- -- -- -- --  PHOS -- -- -- -- --    Liver Function Tests: No results found for this basename: AST:5,ALT:5,ALKPHOS:5,BILITOT:5,PROT:5,ALBUMIN:5 in the last 168 hours No results found for this basename: LIPASE:5,AMYLASE:5 in the last 168 hours No results found for this basename: AMMONIA:3 in the last 168 hours  CBC:  Lab 03/31/11 0540 03/30/11 0500 03/29/11 0605 03/28/11 0711 03/27/11 0719  WBC 11.9* 14.2* 15.8* 12.9* 12.8*  NEUTROABS 8.1* 9.7* -- 8.8* --  HGB 10.0* 10.5* 10.6* 9.8* 10.3*  HCT 31.7* 33.4* 33.7* 31.5* 33.1*  MCV 87.6 88.8 87.8 87.0 88.0  PLT 280 305 324 278 319    Cardiac Enzymes:  Lab 04/01/11 0530 03/31/11 2301 03/31/11 1546  CKTOTAL 35 41 40  CKMB 2.8 3.1 3.0  CKMBINDEX -- -- --  TROPONINI <0.30 <0.30 <0.30    BNP: No components found with this basename: POCBNP:5  CBG:  Lab 04/01/11 0751 03/31/11 2123 03/31/11 1637 03/31/11 1119 03/31/11 0758  GLUCAP 107* 185* 142* 183* 105*    Coagulation Studies: No results  found for this basename: LABPROT:5,INR:5 in the last 72 hours    Imaging: No results found.   Medications:     Scheduled Medications:    . sodium chloride   Intravenous Once  . amLODipine  5 mg Oral Daily  . antiseptic oral rinse  15 mL Mouth Rinse BID  . aspirin EC  325 mg Oral Daily  . benzonatate  200 mg Oral TID  . budesonide  0.5 mg Nebulization BID  . calcitRIOL  0.25 mcg Oral Daily  . citalopram  40 mg Oral Daily  . furosemide  40 mg Oral Q breakfast  . insulin aspart  0-9 Units Subcutaneous TID WC  . insulin aspart protamine-insulin aspart  30 Units Subcutaneous BID WC  . ipratropium  0.5 mg Nebulization TID  .  levalbuterol  0.63 mg Nebulization TID  . losartan  50 mg Oral Daily  . mulitivitamin with minerals  1 tablet Oral Daily  . mupirocin cream   Topical Daily  . predniSONE  20 mg Oral QAC breakfast  . sodium chloride  3 mL Intravenous Q12H  . DISCONTD: ipratropium  0.5 mg Nebulization Q6H  . DISCONTD: levalbuterol  0.63 mg Nebulization Q6H    Infusions:    PRN Medications: acetaminophen, acetaminophen, ALPRAZolam, levalbuterol, nitroGLYCERIN, ondansetron (ZOFRAN) IV, ondansetron, DISCONTD: albuterol   Assessment:   1. A/c respiratory failure  2. A/c systolic HF      --EF 35%   3. Chest pain, pleuritic   4. COPD on home O2  5. Morbid obesity  6. DM2  7. HL  8. HTN 9. Hyperkalemia, resolved 10. Orthostatic hypotension  Plan/Discussion:    Overall stable. Weight going back up to admit weight. Breathing is fine. On good meds. It is time for her to go home. Would make sure she goes home on lasix and not hold it as I think she will re-accumulate fluid quickly otherwise. We will see her in clinic next week.   Elizeth Weinrich,MD 8:42 AM

## 2011-04-01 NOTE — Progress Notes (Signed)
Physical Therapy Treatment/Vestibular Assessment Patient Details Name: [name] MRN: [id number] DOB: 1945-05-25 Today's Date: 04/01/2011  PT Assessment/Plan  PT - Assessment/Plan Comments on Treatment Session: Vestibular assessment completed.  The patinet was highly sensative to occulomotor testing, both horizontal and vertical gaze stability testing, and VOR cancellation testing.  She had a (+) R modified Dix hallpike, (-) L modified dix hallpike.  We had to do the modified version because the patient reported she could not lie flat due to her breathing issues.  There was notable rotational nystagumus with R posterior canal testing.  We did do head turn testing for horizontal canal which was (+) bil for symptoms, but not for nystagmus.  The patient became very nauseated with testing and had to stop several times.  I educated her on HEP exercises and provided a handout (see above).  She will need 24 hour supervision initially until vertig resolves.  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  The patient reports that she "thinks" her daughter can stay with her 24 hours at discharge, but this has not been confirmed yet.  I discussed with her that if she doesn't have 24 hour assist at home she is at a very high risk of falls and will need to persue SNF placement for rehab.  She understands this, but would prefer to go home.   PT Plan: Discharge plan remains appropriate;Frequency remains appropriate PT Frequency: Min 3X/week Follow Up Recommendations: Home health PT (with a vestibular specialist);Supervision/Assistance - 24 hour (if no 24 hour assist she will need SNF placement.  ) Equipment Recommended: None recommended by PT PT Goals  Additional Goals Additional Goal #1: Show independence with vestibular HEP. PT Goal: Additional Goal #1 - Progress: Goal set today  PT Treatment Precautions/Restrictions  Precautions Precautions:  Fall Restrictions Other Position/Activity Restrictions: on continuous 02 Exercise  Other Exercises Other Exercises: x1 exercises given for HEP both vertical and horizontal head shaking.  (see handout in assessment portion of hard chart for handouts) Other Exercises: Brandt-Daroff exercise given for HEP starting lying on the right side looking left.   (see handout in assessment portion of hard chart for handouts) End of Session PT - End of Session Equipment Utilized During Treatment:  (O2 ) Activity Tolerance: Treatment limited secondary to medical complications (Comment) (limited by nausea and dizziness) Patient left: in bed;Other (comment) (seated EOB) General Behavior During Session: [facility] for tasks performed Cognition: [facility] for tasks performed  [place] Devine B. Rufino Staup, PT, DPT [phone] 04/01/2011, 2:07 PM

## 2011-04-01 NOTE — Progress Notes (Addendum)
Physical Therapy Treatment Patient Details Name: [name] MRN: [id number] DOB: [birth year] Today's Date: 04/01/2011  PT Assessment/Plan  PT - Assessment/Plan Comments on Treatment Session: Pt continues to be limited by Dizziness during gait training.  Pt continues to demonstrate significant falls risk and will benefit from 24/7 supervision for safety at home and HHPT follow-up.  Pt's HR increased to 120 during gait training and pt c/o mild chest discomfort (felt like my hart was beating fast). Pt was on 4L O2 via nasal canulae and sats >92 throughout session.   PT Plan: Discharge plan remains appropriate PT Frequency: Min 3X/week Follow Up Recommendations: Home health PT Equipment Recommended: None recommended by OT PT Goals  Acute Rehab PT Goals PT Goal Formulation: With patient Time For Goal Achievement: 7 days Pt will go Supine/Side to Sit: with modified independence PT Goal: Supine/Side to Sit - Progress: Met Pt will go Sit to Supine/Side: with modified independence PT Goal: Sit to Supine/Side - Progress: Met Pt will go Sit to Stand: with modified independence PT Goal: Sit to Stand - Progress: Met Pt will go Stand to Sit: with modified independence PT Goal: Stand to Sit - Progress: Met Pt will Ambulate: 51 - 150 feet;with supervision;with least restrictive assistive device PT Goal: Ambulate - Progress: Progressing toward goal  PT Treatment Precautions/Restrictions  Precautions Precautions: Fall Restrictions Other Position/Activity Restrictions: on continuous 02 Mobility (including Balance) Bed Mobility Bed Mobility: Yes Supine to Sit: 7: Independent Transfers Transfers: Yes Sit to Stand: 6: Modified independent (Device/Increase time);With upper extremity assist;From bed Stand to Sit: 5: Supervision;With upper extremity assist;To chair/3-in-1;To bed Stand to Sit Details: supervision for safety secondary to pt c/o dizziness.   Ambulation/Gait Ambulation/Gait:  Yes Ambulation/Gait Assistance: 5: Supervision Ambulation/Gait Assistance Details (indicate cue type and reason): Supervision for safety.  Pt c/o dizziness after 1st 30 feet, able to continue after standing for <30 seconds, ambulated another 20 feet and was unable to continue due to dizziness.   Ambulation Distance (Feet): 50 Feet Assistive device: Rolling walker Gait Pattern: Step-through pattern;Decreased stride length;Trunk flexed Gait velocity: slower pace Stairs: No Wheelchair Mobility Wheelchair Mobility: No  Posture/Postural Control Posture/Postural Control: No significant limitations Balance Balance Assessed: Yes Static Sitting Balance Static Sitting - Balance Support: Feet supported Static Sitting - Level of Assistance: 7: Independent Static Sitting - Comment/# of Minutes: several min >10 on EOB while preparing for therapy.  Pt able to don socks sitting on EOB independently with no LOB>. Static Standing Balance Static Standing - Balance Support: Bilateral upper extremity supported Static Standing - Level of Assistance: 6: Modified independent (Device/Increase time) Exercise    End of Session PT - End of Session Equipment Utilized During Treatment: Gait belt Activity Tolerance: Patient limited by fatigue;Treatment limited secondary to medical complications (Comment) (pt limited by dizziness) Patient left: in chair Nurse Communication: Mobility status for ambulation General Behavior During Session: [facility] for tasks performed Cognition: [facility] for tasks performed Pt reports being discharged to home today. Provided pt and son with extensive education for safe mobility at due to continued dizziness.  Pt instructed to progress activity slowly to reduce risk of falls and to have supervision when ambulating.   I am concerned about pt safety as dizziness has not significantly improved.     Natayah Warmack 04/01/2011, 10:53 AM Theron Arista L. Greysyn Vanderberg DPT [phone]

## 2011-04-05 ENCOUNTER — Telehealth: Payer: Self-pay | Admitting: *Deleted

## 2011-04-05 NOTE — Telephone Encounter (Signed)
[facility] informed of MD's advisement.

## 2011-04-05 NOTE — Telephone Encounter (Signed)
[name] called. Pt was referred by hospitalist to [facility], they want to know if you would be willing to sign orders for pt.  Callback # is [phone]

## 2011-04-05 NOTE — Telephone Encounter (Signed)
Needs ov here first, this week

## 2011-04-07 ENCOUNTER — Telehealth: Payer: Self-pay | Admitting: *Deleted

## 2011-04-07 NOTE — Telephone Encounter (Signed)
[name] PT is calling for verbal order for physical therapy 3 times a week for 1 week and 2 times a week for 6 weeks-okay for verbal order?

## 2011-04-07 NOTE — Telephone Encounter (Signed)
Ov is needed here first.  Ok for add-on tomorrow.

## 2011-04-08 NOTE — Telephone Encounter (Signed)
Left message for pt to callback office and also informed PT of MD's advisement.

## 2011-04-13 ENCOUNTER — Ambulatory Visit (HOSPITAL_COMMUNITY): Payer: Medicare Other

## 2011-04-19 ENCOUNTER — Other Ambulatory Visit: Payer: Self-pay | Admitting: Internal Medicine

## 2011-04-19 ENCOUNTER — Other Ambulatory Visit: Payer: Self-pay | Admitting: Endocrinology

## 2011-04-23 ENCOUNTER — Ambulatory Visit: Payer: Medicare Other | Admitting: Endocrinology

## 2011-04-28 ENCOUNTER — Other Ambulatory Visit: Payer: Self-pay | Admitting: *Deleted

## 2011-04-28 MED ORDER — AMLODIPINE BESYLATE 5 MG PO TABS: 5.0000 mg | ORAL_TABLET | Freq: Every day | ORAL | Status: DC

## 2011-04-28 NOTE — Telephone Encounter (Signed)
R'cd fax from [facility] for refill of Amlodipine

## 2011-05-01 ENCOUNTER — Other Ambulatory Visit: Payer: Self-pay | Admitting: Internal Medicine

## 2011-05-03 ENCOUNTER — Other Ambulatory Visit: Payer: Self-pay | Admitting: *Deleted

## 2011-05-03 ENCOUNTER — Ambulatory Visit (HOSPITAL_COMMUNITY)
Admission: RE | Admit: 2011-05-03 | Discharge: 2011-05-03 | Disposition: A | Discharge status: Home / Self Care | Payer: Medicare Other | Source: Ambulatory Visit | Attending: Internal Medicine | Admitting: Internal Medicine

## 2011-05-03 VITALS — BP 142/64 | HR 115 | Wt 265.8 lb

## 2011-05-03 DIAGNOSIS — I509 Heart failure, unspecified: Secondary | ICD-10-CM

## 2011-05-03 DIAGNOSIS — I5023 Acute on chronic systolic (congestive) heart failure: Secondary | ICD-10-CM

## 2011-05-03 DIAGNOSIS — I2510 Atherosclerotic heart disease of native coronary artery without angina pectoris: Secondary | ICD-10-CM

## 2011-05-03 DIAGNOSIS — I5022 Chronic systolic (congestive) heart failure: Secondary | ICD-10-CM

## 2011-05-03 MED ORDER — FUROSEMIDE 40 MG PO TABS: 40.0000 mg | ORAL_TABLET | Freq: Two times a day (BID) | ORAL | Status: DC

## 2011-05-03 MED ORDER — LOSARTAN POTASSIUM 50 MG PO TABS: 50.0000 mg | ORAL_TABLET | Freq: Every day | ORAL | Status: DC

## 2011-05-03 NOTE — Patient Instructions (Signed)
Increase lasix 40 mg twice daily.  If you get dizzy let us know.    Follow up labs on 5/2 or 5/3 at the [facility].    Follow up with Dr. Gala Romney in 4-6 weeks.    Do the following things EVERYDAY: 1) Weigh yourself in the morning before breakfast. Write it down and keep it in a log. 2) Take your medicines as prescribed 3) Eat low salt foods--Limit salt (sodium) to 2000mg  per day.  4) Stay as active as you can everyday 5) Keep fluids less than 1.5 liters daily.

## 2011-05-03 NOTE — Assessment & Plan Note (Addendum)
Difficult situation as both CHF and COPD contributing. Volume status is elevated, NYHA III.  Will increase lasix to 40 mg BID and monitor renal function closely, check BMET next week.  She has been instructed to call if she becomes dizzy/lightheaded.  Had a lengthy discussion concerning low sodium diet, how to read labels and better food choices.  Reinforced need for daily weights and reviewed use of sliding scale diuretics. Holding off on b-blocker due to severe COPD. May consider low dose bisoprolol in future.

## 2011-05-03 NOTE — Telephone Encounter (Signed)
R'cd fax from [facility] for refill of Losartan.  

## 2011-05-04 ENCOUNTER — Ambulatory Visit: Payer: Medicare Other | Admitting: Internal Medicine

## 2011-05-04 ENCOUNTER — Ambulatory Visit: Payer: Medicare Other | Admitting: Endocrinology

## 2011-05-10 DIAGNOSIS — I2510 Atherosclerotic heart disease of native coronary artery without angina pectoris: Secondary | ICD-10-CM | POA: Insufficient documentation

## 2011-05-10 NOTE — Progress Notes (Signed)
Weight Range      Baseline proBNP   1715    HPI: 66 y/o woman with multiple medical problems including morbid obesity, COPD on 4L home O2, DM2, HTN and HL. Has had multiple admissions for respiratory failure.   Echo 03/24/11: LVEF 30-35% with mild concentric hypertrophy.  Mild MR.  LA mildly dilated.  Myoview 03/25/11:  No evidence for inducible ischemia. Moderate inferolateral and small distal anteroseptal fixed defects are identified and may be related to scar.  Inferior and mid/distal inferolateral hypokinesia extending into the apex.  Left ventricular ejection fraction of 35%. Cath deferred as she was not candidate for CABG (due to COPD) and not having angina to warrant PCI if lesion was found. Opted for medical therapy.   She is here for post hospital follow up.  She feels ok while she is sitting but has had increased dyspnea when walking.  Chronic 3 pillow orthopnea.  No PND.  +Lower extremity edema over the last 4-5 days.  No chest pain.  She is eating bacon, sausage, canned vegetables and soup as well as mac n' cheese.  She is also drinking several bottles of soda and water a day.   ROS: All systems negative except as listed in HPI, PMH and Problem List.  Past Medical History  Diagnosis Date  . RENAL INSUFFICIENCY 10/15/2008  . Edema 06/18/2008  . PNEUMONIA 12/01/2007  . CARPAL TUNNEL SYNDROME, BILATERAL 10/30/2007  . HAND PAIN, LEFT 09/04/2007  . Morbid obesity   . ANXIETY 11/08/2006  . HYPERTENSION 11/08/2006  . DIABETES MELLITUS, TYPE II 11/08/2006  . HYPERCHOLESTEROLEMIA 07/24/2009  . ANEMIA 07/24/2009  . COPD 11/08/2006    -HFA 50% p coaching 10/15/2009  . RESPIRATORY FAILURE, CHRONIC 10/15/2009  . BACK PAIN, LUMBAR 03/07/2009  . CHEST PAIN 07/24/2009  . Restless leg syndrome   . Systolic CHF, acute 03/24/2011  . Ischemic cardiomyopathy 03/26/2011    Current Outpatient Prescriptions  Medication Sig Dispense Refill  . albuterol (PROVENTIL HFA;VENTOLIN HFA) 108 (90 BASE)  MCG/ACT inhaler Inhale 2 puffs into the lungs every 6 (six) hours as needed. For wheeze or shortness of breath      . albuterol (PROVENTIL) (2.5 MG/3ML) 0.083% nebulizer solution USE ONE VIAL IN NEBULIZER 4 TIMES DAILY OR EVERY 6 HOURS AS NEEDED  75 mL  4  . ALPRAZolam (XANAX) 0.25 MG tablet TAKE ONE TABLET BY MOUTH TWICE DAILY AS NEEDED FOR ANXIETY. NOT  TO  EXCEED  2  PER  DAY  60 tablet  2  . amLODipine (NORVASC) 5 MG tablet Take 1 tablet (5 mg total) by mouth daily.  30 tablet  3  . aspirin 81 MG tablet Take 81 mg by mouth daily.       . calcitRIOL (ROCALTROL) 0.25 MCG capsule Take 0.25 mcg by mouth daily.        . citalopram (CELEXA) 40 MG tablet Take 40 mg by mouth daily.        . furosemide (LASIX) 40 MG tablet Take 1 tablet (40 mg total) by mouth 2 (two) times daily.  60 tablet  3  . insulin aspart protamine-insulin aspart (NOVOLOG 70/30) (70-30) 100 UNIT/ML injection Inject 30 Units into the skin 2 (two) times daily with a meal.  10 mL  1  . losartan (COZAAR) 50 MG tablet Take 1 tablet (50 mg total) by mouth daily.  30 tablet  3  . Multiple Vitamin (MULTIVITAMIN) tablet Take 1 tablet by mouth daily.       Marland Kitchen  NON FORMULARY Oxygen 4LPM  24/7       . OVER THE COUNTER MEDICATION Take 1 tablet by mouth daily. Iron tablet.      Marland Kitchen SPIRIVA HANDIHALER 18 MCG inhalation capsule INHALE ONE DOSE BY MOUTH EVERY DAY  30 each  2  . SYMBICORT 160-4.5 MCG/ACT inhaler INHALE TWO PUFFS BY MOUTH TWICE DAILY  11 g  3  . ULTICARE INSULIN SYRINGE 31G X 5/16" 1 ML MISC USE ONE TWICE DAILY  100 each  3  . DISCONTD: cloNIDine (CATAPRES) 0.1 MG tablet Take 0.1 mg by mouth 3 (three) times daily.       Marland Kitchen DISCONTD: famotidine (PEPCID) 20 MG tablet Take 20 mg by mouth daily as needed. For heartburn.         PHYSICAL EXAM: Filed Vitals:   05/03/11 1504  BP: 142/64  Pulse: 115  Weight: 265 lb 12 oz (120.543 kg)  SpO2: 98%    General:  Well appearing. No resp difficulty HEENT: normal Neck: supple. JVP  difficult to see. Carotids 2+ bilaterally; no bruits. No lymphadenopathy or thryomegaly appreciated. Cor: PMI normal. Regular rate & rhythm. No rubs, gallops or murmurs. Lungs: clear Abdomen: soft, nontender, nondistended. No hepatosplenomegaly. No bruits or masses. Good bowel sounds. Extremities: no cyanosis, clubbing, rash, 1+ edema  Neuro: alert & orientedx3, cranial nerves grossly intact. Moves all 4 extremities w/o difficulty. Affect pleasant.    ASSESSMENT & PLAN:

## 2011-05-10 NOTE — Assessment & Plan Note (Signed)
Myoview suggestive of significant underlying CAD. As per HPI, we are proceeding with medical therapy for now with ASA and statin. Consider b-blocker as she can tolerate. If develops angina would have low threshold to cath.

## 2011-05-13 ENCOUNTER — Ambulatory Visit: Payer: Medicare Other | Admitting: Endocrinology

## 2011-05-13 DIAGNOSIS — Z0289 Encounter for other administrative examinations: Secondary | ICD-10-CM

## 2011-05-21 ENCOUNTER — Ambulatory Visit: Payer: Medicare Other | Admitting: Endocrinology

## 2011-05-22 ENCOUNTER — Other Ambulatory Visit: Payer: Self-pay | Admitting: Endocrinology

## 2011-05-24 ENCOUNTER — Ambulatory Visit: Payer: Medicare Other | Admitting: Internal Medicine

## 2011-05-24 ENCOUNTER — Ambulatory Visit: Payer: Medicare Other

## 2011-05-24 ENCOUNTER — Ambulatory Visit: Payer: Medicare Other | Admitting: Endocrinology

## 2011-05-24 ENCOUNTER — Encounter: Payer: Self-pay | Admitting: Endocrinology

## 2011-05-24 ENCOUNTER — Ambulatory Visit (INDEPENDENT_AMBULATORY_CARE_PROVIDER_SITE_OTHER): Payer: Medicare Other | Admitting: Endocrinology

## 2011-05-24 VITALS — BP 142/74 | HR 113 | Temp 97.2°F | Wt 269.0 lb

## 2011-05-24 DIAGNOSIS — N058 Unspecified nephritic syndrome with other morphologic changes: Secondary | ICD-10-CM

## 2011-05-24 DIAGNOSIS — J441 Chronic obstructive pulmonary disease with (acute) exacerbation: Secondary | ICD-10-CM

## 2011-05-24 DIAGNOSIS — E1029 Type 1 diabetes mellitus with other diabetic kidney complication: Secondary | ICD-10-CM

## 2011-05-24 DIAGNOSIS — I10 Essential (primary) hypertension: Secondary | ICD-10-CM

## 2011-05-24 DIAGNOSIS — I5023 Acute on chronic systolic (congestive) heart failure: Secondary | ICD-10-CM

## 2011-05-24 DIAGNOSIS — E119 Type 2 diabetes mellitus without complications: Secondary | ICD-10-CM

## 2011-05-24 DIAGNOSIS — R262 Difficulty in walking, not elsewhere classified: Secondary | ICD-10-CM

## 2011-05-24 DIAGNOSIS — E1065 Type 1 diabetes mellitus with hyperglycemia: Secondary | ICD-10-CM | POA: Insufficient documentation

## 2011-05-24 LAB — BASIC METABOLIC PANEL
CO2: 29 mEq/L (ref 19–32)
GFR: 40.65 mL/min — ABNORMAL LOW (ref >60.00)
Glucose, Bld: 261 mg/dL — ABNORMAL HIGH (ref 70–99)
Potassium: 4.6 mEq/L (ref 3.5–5.1)
Sodium: 137 mEq/L (ref 135–145)

## 2011-05-24 MED ORDER — TRIAMCINOLONE ACETONIDE 0.1 % EX CREA: TOPICAL_CREAM | Freq: Two times a day (BID) | CUTANEOUS | Status: DC

## 2011-05-24 NOTE — Telephone Encounter (Signed)
i refilled x 1 Ov is due 

## 2011-05-24 NOTE — Patient Instructions (Addendum)
Please come back for a "medicare wellness" appointment in 1 month. i have sent a prescription to your pharmacy, for an anti-itch cream. check your blood sugar 2 times a day.  vary the time of day when you check, between before the 3 meals, and at bedtime.  also check if you have symptoms of your blood sugar being too high or too low.  please keep a record of the readings and bring it to your next appointment here.  please call us sooner if your blood sugar goes below 70, or if it stays over 200. Try taking "naphcon" eye drops as needed for the itching.

## 2011-05-24 NOTE — Progress Notes (Signed)
Subjective:    Patient ID: [name], female    DOB: 1945/01/26, 66 y.o.   MRN: [id number]  HPI Pt returns for f/u of type 2 DM (dx'ed, complicated by CAD, renal insufficiency, and peripheral sensory neuropathy).  she brings a record of her cbg's which i have reviewed today.  It varies from 150-200, but most are in the 100's.   Pt states 1 month of moderate itching at the mid-back.  Associated pain is much better. Past Medical History  Diagnosis Date  . RENAL INSUFFICIENCY 10/15/2008  . Edema 06/18/2008  . PNEUMONIA 12/01/2007  . CARPAL TUNNEL SYNDROME, BILATERAL 10/30/2007  . HAND PAIN, LEFT 09/04/2007  . Morbid obesity   . ANXIETY 11/08/2006  . HYPERTENSION 11/08/2006  . DIABETES MELLITUS, TYPE II 11/08/2006  . HYPERCHOLESTEROLEMIA 07/24/2009  . ANEMIA 07/24/2009  . COPD 11/08/2006    -HFA 50% p coaching 10/15/2009  . RESPIRATORY FAILURE, CHRONIC 10/15/2009  . BACK PAIN, LUMBAR 03/07/2009  . CHEST PAIN 07/24/2009  . Restless leg syndrome   . Systolic CHF, acute 03/24/2011  . Ischemic cardiomyopathy 03/26/2011    Past Surgical History  Procedure Date  . Abdominal hysterectomy     History   Social History  . Marital Status: Widowed    Spouse Name: N/A    Number of Children: 2  . Years of Education: N/A   Occupational History  . 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 Smoking status: Former Smoker -- 2.0 packs/day for 40 years    Quit date: 01/12/2008  . Smokeless tobacco: Not on file  . Alcohol Use: No  . Drug Use: No  . Sexually Active: Not on file   Other Topics Concern  . Not on file   Social History Narrative   Divorced. Lives with dtr.    Current Outpatient Prescriptions on File Prior to Visit  Medication Sig Dispense Refill  . albuterol (PROVENTIL HFA;VENTOLIN HFA) 108 (90 BASE) MCG/ACT inhaler Inhale 2 puffs into the lungs every 6 (six) hours as needed. For wheeze or shortness of breath      . albuterol (PROVENTIL) (2.5  MG/3ML) 0.083% nebulizer solution USE ONE VIAL IN NEBULIZER 4 TIMES DAILY OR EVERY 6 HOURS AS NEEDED  75 mL  4  . ALPRAZolam (XANAX) 0.25 MG tablet TAKE ONE TABLET BY MOUTH TWICE DAILY AS NEEDED FOR ANXIETY. DO NOT EXCEED 2 PER DAY  30 tablet  0  . amLODipine (NORVASC) 5 MG tablet Take 1 tablet (5 mg total) by mouth daily.  30 tablet  3  . aspirin 81 MG tablet Take 81 mg by mouth daily.       . calcitRIOL (ROCALTROL) 0.25 MCG capsule Take 0.25 mcg by mouth daily.        . citalopram (CELEXA) 40 MG tablet Take 40 mg by mouth daily.        . furosemide (LASIX) 40 MG tablet Take 1 tablet (40 mg total) by mouth 2 (two) times daily.  60 tablet  3  . insulin aspart protamine-insulin aspart (NOVOLOG 70/30) (70-30) 100 UNIT/ML injection Inject 30 Units into the skin 2 (two) times daily with a meal.  10 mL  1  . losartan (COZAAR) 50 MG tablet Take 1 tablet (50 mg total) by mouth daily.  30 tablet  3  . Multiple Vitamin (MULTIVITAMIN) tablet Take 1 tablet by mouth daily.       . NON FORMULARY Oxygen 4LPM  24/7       . OVER THE COUNTER MEDICATION Take 1 tablet by mouth daily. Iron tablet.      Marland Kitchen SPIRIVA HANDIHALER 18 MCG inhalation capsule INHALE ONE DOSE BY MOUTH EVERY DAY  30 each  2  . SYMBICORT 160-4.5 MCG/ACT inhaler INHALE TWO PUFFS BY MOUTH TWICE DAILY  11 g  3  . ULTICARE INSULIN SYRINGE 31G X 5/16" 1 ML MISC USE ONE TWICE DAILY  100 each  3  . DISCONTD: cloNIDine (CATAPRES) 0.1 MG tablet Take 0.1 mg by mouth 3 (three) times daily.       Marland Kitchen DISCONTD: famotidine (PEPCID) 20 MG tablet Take 20 mg by mouth daily as needed. For heartburn.        Allergies  Allergen Reactions  . Pioglitazone     REACTION: edema  . Rosiglitazone Maleate Nausea And Vomiting    Family History  Problem Relation Age of Onset  . Heart disease Mother   . Pancreatic cancer Mother   . Stroke Mother   . Asthma Paternal Grandfather   . Liver cancer Father   . Diabetes Father     BP 142/74  Pulse 113  Temp(Src)  97.2 F (36.2 C) (Oral)  Wt 269 lb (122.018 kg)  SpO2 91%   Review of Systems denies hypoglycemia and fever.  She has itching of the eyes.    Objective:   Physical Exam VITAL SIGNS:  See vs page GENERAL: no distress Pulses: dorsalis pedis intact bilat.   Feet: no deformity.  no ulcer on the feet.  feet are of normal color and temp.  1+ bilat leg edema.  There is bilateral onychomycosis Neuro: sensation is intact to touch on the feet, but decreased from normal.  Lab Results  Component Value Date   HGBA1C 7.1* 03/23/2011      Assessment & Plan:  DM.  this is the best control this pt should aim for, given this regimen, which does match insulin to her changing needs throughout the day Pruritis, new, uncertain etiology HTN, with probably situational component

## 2011-05-24 NOTE — Telephone Encounter (Signed)
Last written 02/16/2011 #60 with 2 refills. Please advise

## 2011-05-24 NOTE — Telephone Encounter (Signed)
Rx faxed to [facility] pharmacy. Pt has appointment for 05/24/2011 1:30pm.

## 2011-05-31 ENCOUNTER — Telehealth: Payer: Self-pay

## 2011-05-31 NOTE — Telephone Encounter (Signed)
Olegario Messier called requesting verbal authorization from Dr Everardo All to re-start pt home care services. Pt's services were previously placed on hold because of her non-compliance with follow up visits. Pr was last seen 05-24-2011. Please advise.

## 2011-05-31 NOTE — Telephone Encounter (Signed)
ok 

## 2011-05-31 NOTE — Telephone Encounter (Signed)
Olegario Messier at [place] informed.

## 2011-06-07 ENCOUNTER — Other Ambulatory Visit: Payer: Self-pay | Admitting: Internal Medicine

## 2011-06-08 ENCOUNTER — Telehealth: Payer: Self-pay | Admitting: *Deleted

## 2011-06-08 NOTE — Telephone Encounter (Signed)
Ov tomorrow 

## 2011-06-08 NOTE — Telephone Encounter (Signed)
[name] called-pt was seen today and is having urinary symptoms and wants to know if you would like for them to collect a UA on pt.

## 2011-06-09 NOTE — Telephone Encounter (Signed)
Pt does not have transportation and would have to wait a week for transportation through [facility].

## 2011-06-09 NOTE — Telephone Encounter (Signed)
Left detailed VM on [facility] VM for verbal order to collect UA specimen and for pt to keep appointment in June with PCP.

## 2011-06-09 NOTE — Telephone Encounter (Signed)
Ok for ua by hh. Keep appt with me in june

## 2011-06-11 ENCOUNTER — Telehealth (HOSPITAL_COMMUNITY): Payer: Self-pay | Admitting: *Deleted

## 2011-06-11 DIAGNOSIS — E119 Type 2 diabetes mellitus without complications: Secondary | ICD-10-CM

## 2011-06-11 DIAGNOSIS — I5023 Acute on chronic systolic (congestive) heart failure: Secondary | ICD-10-CM

## 2011-06-11 DIAGNOSIS — J441 Chronic obstructive pulmonary disease with (acute) exacerbation: Secondary | ICD-10-CM

## 2011-06-11 DIAGNOSIS — I10 Essential (primary) hypertension: Secondary | ICD-10-CM

## 2011-06-11 NOTE — Telephone Encounter (Signed)
Pt called back. Please call her next week. Thanks.

## 2011-06-11 NOTE — Telephone Encounter (Signed)
[name] called today. She would like someone to call her with the results of her recent blood work.  Thanks.

## 2011-06-11 NOTE — Telephone Encounter (Signed)
Left message to call back  

## 2011-06-14 ENCOUNTER — Other Ambulatory Visit: Payer: Self-pay

## 2011-06-14 MED ORDER — INSULIN ASPART PROT & ASPART (70-30 MIX) 100 UNIT/ML ~~LOC~~ SUSP: 30.0000 [IU] | Freq: Two times a day (BID) | SUBCUTANEOUS | Status: DC

## 2011-06-15 NOTE — Telephone Encounter (Signed)
Discussed lab results with the patient.  She appreciated the call back.  Will talk to her PCP concerning elevated glucose.

## 2011-06-20 ENCOUNTER — Emergency Department (HOSPITAL_COMMUNITY): Payer: Medicare Other

## 2011-06-20 ENCOUNTER — Inpatient Hospital Stay (HOSPITAL_COMMUNITY)
Admission: EM | Admit: 2011-06-20 | Discharge: 2011-07-02 | DRG: 291 | Disposition: A | Discharge status: Skilled Nursing Facility | Payer: Medicare Other | Attending: Internal Medicine | Admitting: Internal Medicine

## 2011-06-20 DIAGNOSIS — I5022 Chronic systolic (congestive) heart failure: Secondary | ICD-10-CM

## 2011-06-20 DIAGNOSIS — I5043 Acute on chronic combined systolic (congestive) and diastolic (congestive) heart failure: Principal | ICD-10-CM | POA: Diagnosis present

## 2011-06-20 DIAGNOSIS — I509 Heart failure, unspecified: Secondary | ICD-10-CM | POA: Diagnosis present

## 2011-06-20 DIAGNOSIS — I2510 Atherosclerotic heart disease of native coronary artery without angina pectoris: Secondary | ICD-10-CM | POA: Diagnosis present

## 2011-06-20 DIAGNOSIS — Z9981 Dependence on supplemental oxygen: Secondary | ICD-10-CM

## 2011-06-20 DIAGNOSIS — J154 Pneumonia due to other streptococci: Secondary | ICD-10-CM | POA: Diagnosis present

## 2011-06-20 DIAGNOSIS — N289 Disorder of kidney and ureter, unspecified: Secondary | ICD-10-CM | POA: Diagnosis present

## 2011-06-20 DIAGNOSIS — J9620 Acute and chronic respiratory failure, unspecified whether with hypoxia or hypercapnia: Secondary | ICD-10-CM

## 2011-06-20 DIAGNOSIS — K3184 Gastroparesis: Secondary | ICD-10-CM | POA: Diagnosis present

## 2011-06-20 DIAGNOSIS — J189 Pneumonia, unspecified organism: Secondary | ICD-10-CM

## 2011-06-20 DIAGNOSIS — I5023 Acute on chronic systolic (congestive) heart failure: Secondary | ICD-10-CM | POA: Diagnosis present

## 2011-06-20 DIAGNOSIS — R109 Unspecified abdominal pain: Secondary | ICD-10-CM

## 2011-06-20 DIAGNOSIS — Z6841 Body Mass Index (BMI) 40.0 and over, adult: Secondary | ICD-10-CM

## 2011-06-20 DIAGNOSIS — E669 Obesity, unspecified: Secondary | ICD-10-CM | POA: Diagnosis present

## 2011-06-20 DIAGNOSIS — E1149 Type 2 diabetes mellitus with other diabetic neurological complication: Secondary | ICD-10-CM | POA: Diagnosis present

## 2011-06-20 DIAGNOSIS — E119 Type 2 diabetes mellitus without complications: Secondary | ICD-10-CM | POA: Diagnosis present

## 2011-06-20 DIAGNOSIS — J441 Chronic obstructive pulmonary disease with (acute) exacerbation: Secondary | ICD-10-CM | POA: Diagnosis present

## 2011-06-20 MED ORDER — ALBUTEROL SULFATE (5 MG/ML) 0.5% IN NEBU
2.5000 mg | INHALATION_SOLUTION | Freq: Once | RESPIRATORY_TRACT | Status: AC
  Administered 2011-06-20: 2.5 mg via RESPIRATORY_TRACT
  Filled 2011-06-20: qty 0.5

## 2011-06-20 MED ORDER — IPRATROPIUM BROMIDE 0.02 % IN SOLN
0.5000 mg | Freq: Once | RESPIRATORY_TRACT | Status: AC
  Administered 2011-06-20: 0.5 mg via RESPIRATORY_TRACT
  Filled 2011-06-20: qty 2.5

## 2011-06-20 NOTE — ED Notes (Signed)
[id number]<QI> Expected date:<BR> Expected time:<BR> Means of arrival:<BR> Comments:<BR> [name], SoB and UTI

## 2011-06-20 NOTE — ED Notes (Signed)
Per EMS pt has had SOB x 2 days. Pt has  Albuterol neb.  at home but did not take. However she was administered a neb treatment on the way to the ED and currently she is denying SOB. Per EMS upon arrival to patient home she was hyperventilating with a RR 36 and saturation level was 88% on RA. After the albuterol treatment she came up to 96% RA.  She was ST on monitor.  VS- BP: 160/100  P: 130 RR: 18. She was diagnosed with UTI on Tuesday at [facility] but has not taken ATB.Hot to touch but has been taken Ibuprofen per EMS. Productive cough with green colored sputum

## 2011-06-21 ENCOUNTER — Encounter (HOSPITAL_COMMUNITY): Payer: Self-pay | Admitting: Internal Medicine

## 2011-06-21 ENCOUNTER — Inpatient Hospital Stay (HOSPITAL_COMMUNITY): Payer: Medicare Other

## 2011-06-21 DIAGNOSIS — J441 Chronic obstructive pulmonary disease with (acute) exacerbation: Secondary | ICD-10-CM | POA: Diagnosis present

## 2011-06-21 DIAGNOSIS — E119 Type 2 diabetes mellitus without complications: Secondary | ICD-10-CM | POA: Diagnosis present

## 2011-06-21 DIAGNOSIS — J189 Pneumonia, unspecified organism: Secondary | ICD-10-CM | POA: Diagnosis present

## 2011-06-21 DIAGNOSIS — I5023 Acute on chronic systolic (congestive) heart failure: Secondary | ICD-10-CM | POA: Diagnosis present

## 2011-06-21 DIAGNOSIS — N289 Disorder of kidney and ureter, unspecified: Secondary | ICD-10-CM | POA: Diagnosis present

## 2011-06-21 DIAGNOSIS — D696 Thrombocytopenia, unspecified: Secondary | ICD-10-CM

## 2011-06-21 LAB — CARDIAC PANEL(CRET KIN+CKTOT+MB+TROPI)
CK, MB: 4.7 ng/mL — ABNORMAL HIGH (ref 0.3–4.0)
Relative Index: 3 — ABNORMAL HIGH (ref 0.0–2.5)
Relative Index: 3 — ABNORMAL HIGH (ref 0.0–2.5)
Relative Index: INVALID (ref 0.0–2.5)
Total CK: 159 U/L (ref 7–177)
Troponin I: <0.3 ng/mL (ref <0.30)
Troponin I: <0.3 ng/mL (ref <0.30)
Troponin I: <0.3 ng/mL (ref <0.30)

## 2011-06-21 LAB — DIFFERENTIAL
Basophils Absolute: 0 10*3/uL (ref 0.0–0.1)
Basophils Relative: 0 % (ref 0–1)
Basophils Relative: 0 % (ref 0–1)
Eosinophils Absolute: 0 10*3/uL (ref 0.0–0.7)
Eosinophils Relative: 0 % (ref 0–5)
Lymphocytes Relative: 12 % (ref 12–46)
Lymphs Abs: 0.7 10*3/uL (ref 0.7–4.0)
Monocytes Absolute: 0.7 10*3/uL (ref 0.1–1.0)
Monocytes Absolute: 1 10*3/uL (ref 0.1–1.0)
Monocytes Relative: 5 % (ref 3–12)
Neutro Abs: 12.8 10*3/uL — ABNORMAL HIGH (ref 1.7–7.7)

## 2011-06-21 LAB — CBC
HCT: 33.2 % — ABNORMAL LOW (ref 36.0–46.0)
HCT: 34.5 % — ABNORMAL LOW (ref 36.0–46.0)
Hemoglobin: 11.1 g/dL — ABNORMAL LOW (ref 12.0–15.0)
MCH: 29.1 pg (ref 26.0–34.0)
MCHC: 32.2 g/dL (ref 30.0–36.0)
MCHC: 32.8 g/dL (ref 30.0–36.0)
MCV: 88.8 fL (ref 78.0–100.0)
RBC: 3.88 MIL/uL (ref 3.87–5.11)
RDW: 14.5 % (ref 11.5–15.5)

## 2011-06-21 LAB — BASIC METABOLIC PANEL
BUN: 28 mg/dL — ABNORMAL HIGH (ref 6–23)
Chloride: 94 mEq/L — ABNORMAL LOW (ref 96–112)
GFR calc Af Amer: 47 mL/min — ABNORMAL LOW (ref >90)
GFR calc non Af Amer: 40 mL/min — ABNORMAL LOW (ref >90)
Potassium: 4.6 mEq/L (ref 3.5–5.1)
Sodium: 134 mEq/L — ABNORMAL LOW (ref 135–145)

## 2011-06-21 LAB — URINALYSIS, ROUTINE W REFLEX MICROSCOPIC
Bilirubin Urine: NEGATIVE
Ketones, ur: NEGATIVE mg/dL
Leukocytes, UA: NEGATIVE
Nitrite: NEGATIVE
Specific Gravity, Urine: 1.02 (ref 1.005–1.030)
Urobilinogen, UA: 0.2 mg/dL (ref 0.0–1.0)
pH: 5.5 (ref 5.0–8.0)

## 2011-06-21 LAB — PRO B NATRIURETIC PEPTIDE: Pro B Natriuretic peptide (BNP): 11256 pg/mL — ABNORMAL HIGH (ref 0–125)

## 2011-06-21 LAB — URINE MICROSCOPIC-ADD ON

## 2011-06-21 LAB — COMPREHENSIVE METABOLIC PANEL
Alkaline Phosphatase: 47 U/L (ref 39–117)
BUN: 33 mg/dL — ABNORMAL HIGH (ref 6–23)
Creatinine, Ser: 1.64 mg/dL — ABNORMAL HIGH (ref 0.50–1.10)
GFR calc Af Amer: 37 mL/min — ABNORMAL LOW (ref >90)
Glucose, Bld: 167 mg/dL — ABNORMAL HIGH (ref 70–99)
Potassium: 4.6 mEq/L (ref 3.5–5.1)
Total Protein: 7.6 g/dL (ref 6.0–8.3)

## 2011-06-21 LAB — GLUCOSE, CAPILLARY
Glucose-Capillary: 136 mg/dL — ABNORMAL HIGH (ref 70–99)
Glucose-Capillary: 153 mg/dL — ABNORMAL HIGH (ref 70–99)
Glucose-Capillary: 269 mg/dL — ABNORMAL HIGH (ref 70–99)
Glucose-Capillary: 360 mg/dL — ABNORMAL HIGH (ref 70–99)

## 2011-06-21 MED ORDER — INSULIN ASPART 100 UNIT/ML ~~LOC~~ SOLN
0.0000 [IU] | Freq: Every day | SUBCUTANEOUS | Status: DC
Start: 2011-06-21 — End: 2011-07-02
  Administered 2011-06-21: 5 [IU] via SUBCUTANEOUS
  Administered 2011-06-22: 3 [IU] via SUBCUTANEOUS
  Administered 2011-06-23 – 2011-06-26 (×2): 2 [IU] via SUBCUTANEOUS

## 2011-06-21 MED ORDER — ALBUTEROL SULFATE (5 MG/ML) 0.5% IN NEBU
2.5000 mg | INHALATION_SOLUTION | Freq: Once | RESPIRATORY_TRACT | Status: AC
  Administered 2011-06-21: 2.5 mg via RESPIRATORY_TRACT
  Filled 2011-06-21: qty 0.5

## 2011-06-21 MED ORDER — MOXIFLOXACIN HCL IN NACL 400 MG/250ML IV SOLN
400.0000 mg | INTRAVENOUS | Status: DC
  Administered 2011-06-22 – 2011-06-25 (×4): 400 mg via INTRAVENOUS
  Filled 2011-06-21 (×4): qty 250

## 2011-06-21 MED ORDER — ENOXAPARIN SODIUM 60 MG/0.6ML ~~LOC~~ SOLN
60.0000 mg | SUBCUTANEOUS | Status: DC
  Administered 2011-06-21 – 2011-07-02 (×11): 60 mg via SUBCUTANEOUS
  Filled 2011-06-21 (×12): qty 0.6

## 2011-06-21 MED ORDER — ENOXAPARIN SODIUM 40 MG/0.4ML ~~LOC~~ SOLN: 40.0000 mg | SUBCUTANEOUS | Status: DC

## 2011-06-21 MED ORDER — ALBUTEROL SULFATE (5 MG/ML) 0.5% IN NEBU
2.5000 mg | INHALATION_SOLUTION | Freq: Four times a day (QID) | RESPIRATORY_TRACT | Status: DC
  Administered 2011-06-21: 2.5 mg via RESPIRATORY_TRACT
  Filled 2011-06-21: qty 0.5

## 2011-06-21 MED ORDER — INSULIN ASPART PROT & ASPART (70-30 MIX) 100 UNIT/ML ~~LOC~~ SUSP
30.0000 [IU] | Freq: Two times a day (BID) | SUBCUTANEOUS | Status: DC
  Administered 2011-06-21 – 2011-06-22 (×3): 30 [IU] via SUBCUTANEOUS
  Filled 2011-06-21: qty 3

## 2011-06-21 MED ORDER — VANCOMYCIN HCL 1000 MG IV SOLR
1500.0000 mg | INTRAVENOUS | Status: DC
  Administered 2011-06-21 – 2011-06-24 (×4): 1500 mg via INTRAVENOUS
  Filled 2011-06-21 (×5): qty 1500

## 2011-06-21 MED ORDER — MOXIFLOXACIN HCL IN NACL 400 MG/250ML IV SOLN
400.0000 mg | Freq: Once | INTRAVENOUS | Status: AC
  Administered 2011-06-21: 400 mg via INTRAVENOUS
  Filled 2011-06-21: qty 250

## 2011-06-21 MED ORDER — FERROUS SULFATE 325 (65 FE) MG PO TABS
325.0000 mg | ORAL_TABLET | Freq: Every day | ORAL | Status: DC
  Administered 2011-06-21 – 2011-07-02 (×12): 325 mg via ORAL
  Filled 2011-06-21 (×15): qty 1

## 2011-06-21 MED ORDER — LOSARTAN POTASSIUM 50 MG PO TABS
50.0000 mg | ORAL_TABLET | Freq: Every day | ORAL | Status: DC
  Administered 2011-06-21: 50 mg via ORAL
  Filled 2011-06-21: qty 1

## 2011-06-21 MED ORDER — CALCITRIOL 0.25 MCG PO CAPS
0.2500 ug | ORAL_CAPSULE | Freq: Every day | ORAL | Status: DC
  Administered 2011-06-22 – 2011-07-02 (×11): 0.25 ug via ORAL
  Filled 2011-06-21 (×14): qty 1

## 2011-06-21 MED ORDER — SODIUM CHLORIDE 0.9 % IJ SOLN
3.0000 mL | Freq: Two times a day (BID) | INTRAMUSCULAR | Status: DC
  Administered 2011-06-21 – 2011-07-02 (×20): 3 mL via INTRAVENOUS

## 2011-06-21 MED ORDER — ALPRAZOLAM 0.25 MG PO TABS
0.2500 mg | ORAL_TABLET | Freq: Two times a day (BID) | ORAL | Status: DC | PRN
  Administered 2011-06-21 – 2011-06-24 (×8): 0.25 mg via ORAL
  Filled 2011-06-21 (×8): qty 1

## 2011-06-21 MED ORDER — IPRATROPIUM BROMIDE 0.02 % IN SOLN
0.5000 mg | Freq: Four times a day (QID) | RESPIRATORY_TRACT | Status: DC
  Administered 2011-06-21 – 2011-07-02 (×39): 0.5 mg via RESPIRATORY_TRACT
  Filled 2011-06-21 (×40): qty 2.5

## 2011-06-21 MED ORDER — FUROSEMIDE 40 MG PO TABS
40.0000 mg | ORAL_TABLET | Freq: Two times a day (BID) | ORAL | Status: DC
  Administered 2011-06-21: 40 mg via ORAL
  Filled 2011-06-21 (×4): qty 1

## 2011-06-21 MED ORDER — ACETAMINOPHEN 650 MG RE SUPP: 650.0000 mg | Freq: Four times a day (QID) | RECTAL | Status: DC | PRN

## 2011-06-21 MED ORDER — ASPIRIN EC 81 MG PO TBEC
81.0000 mg | DELAYED_RELEASE_TABLET | Freq: Every day | ORAL | Status: DC
  Administered 2011-06-21 – 2011-07-02 (×12): 81 mg via ORAL
  Filled 2011-06-21 (×13): qty 1

## 2011-06-21 MED ORDER — ADULT MULTIVITAMIN W/MINERALS CH
1.0000 | ORAL_TABLET | Freq: Every day | ORAL | Status: DC
  Administered 2011-06-21 – 2011-07-02 (×12): 1 via ORAL
  Filled 2011-06-21 (×12): qty 1

## 2011-06-21 MED ORDER — FUROSEMIDE 10 MG/ML IJ SOLN
40.0000 mg | Freq: Two times a day (BID) | INTRAMUSCULAR | Status: DC
  Filled 2011-06-21 (×2): qty 4

## 2011-06-21 MED ORDER — LEVALBUTEROL HCL 0.63 MG/3ML IN NEBU
0.6300 mg | INHALATION_SOLUTION | Freq: Four times a day (QID) | RESPIRATORY_TRACT | Status: DC
  Administered 2011-06-21 – 2011-07-02 (×38): 0.63 mg via RESPIRATORY_TRACT
  Filled 2011-06-21 (×49): qty 3

## 2011-06-21 MED ORDER — AMLODIPINE BESYLATE 5 MG PO TABS
5.0000 mg | ORAL_TABLET | Freq: Every day | ORAL | Status: DC
  Administered 2011-06-21 – 2011-07-02 (×12): 5 mg via ORAL
  Filled 2011-06-21 (×12): qty 1

## 2011-06-21 MED ORDER — INSULIN ASPART 100 UNIT/ML ~~LOC~~ SOLN
0.0000 [IU] | Freq: Three times a day (TID) | SUBCUTANEOUS | Status: DC
  Administered 2011-06-21: 1 [IU] via SUBCUTANEOUS
  Administered 2011-06-21: 2 [IU] via SUBCUTANEOUS
  Administered 2011-06-21 – 2011-06-22 (×3): 5 [IU] via SUBCUTANEOUS

## 2011-06-21 MED ORDER — LEVALBUTEROL HCL 0.63 MG/3ML IN NEBU
0.6300 mg | INHALATION_SOLUTION | RESPIRATORY_TRACT | Status: DC | PRN
  Administered 2011-06-24: 0.63 mg via RESPIRATORY_TRACT
  Filled 2011-06-21: qty 3

## 2011-06-21 MED ORDER — ACETAMINOPHEN 325 MG PO TABS
650.0000 mg | ORAL_TABLET | Freq: Four times a day (QID) | ORAL | Status: DC | PRN
  Administered 2011-06-21: 650 mg via ORAL
  Filled 2011-06-21: qty 2

## 2011-06-21 MED ORDER — ALBUTEROL SULFATE HFA 108 (90 BASE) MCG/ACT IN AERS
2.0000 | INHALATION_SPRAY | Freq: Four times a day (QID) | RESPIRATORY_TRACT | Status: DC | PRN
  Filled 2011-06-21: qty 6.7

## 2011-06-21 MED ORDER — TIOTROPIUM BROMIDE MONOHYDRATE 18 MCG IN CAPS
18.0000 ug | ORAL_CAPSULE | Freq: Every day | RESPIRATORY_TRACT | Status: DC
  Administered 2011-06-21: 18 ug via RESPIRATORY_TRACT
  Filled 2011-06-21: qty 5

## 2011-06-21 MED ORDER — METHYLPREDNISOLONE SODIUM SUCC 125 MG IJ SOLR
60.0000 mg | Freq: Four times a day (QID) | INTRAMUSCULAR | Status: DC
  Administered 2011-06-21 – 2011-06-23 (×9): 60 mg via INTRAVENOUS
  Filled 2011-06-21 (×12): qty 0.96

## 2011-06-21 MED ORDER — FUROSEMIDE 10 MG/ML IJ SOLN
40.0000 mg | Freq: Two times a day (BID) | INTRAMUSCULAR | Status: AC
  Administered 2011-06-21 – 2011-06-23 (×4): 40 mg via INTRAVENOUS
  Filled 2011-06-21 (×4): qty 4

## 2011-06-21 MED ORDER — BUDESONIDE-FORMOTEROL FUMARATE 160-4.5 MCG/ACT IN AERO
2.0000 | INHALATION_SPRAY | Freq: Two times a day (BID) | RESPIRATORY_TRACT | Status: DC
  Administered 2011-06-21 – 2011-07-02 (×21): 2 via RESPIRATORY_TRACT
  Filled 2011-06-21: qty 6

## 2011-06-21 MED ORDER — CITALOPRAM HYDROBROMIDE 40 MG PO TABS
40.0000 mg | ORAL_TABLET | Freq: Every day | ORAL | Status: DC
  Administered 2011-06-21 – 2011-06-30 (×10): 40 mg via ORAL
  Filled 2011-06-21 (×10): qty 1

## 2011-06-21 NOTE — ED Notes (Signed)
[facility] transported to [facility].  

## 2011-06-21 NOTE — ED Notes (Signed)
Awaiting technician for VQ scan.

## 2011-06-21 NOTE — Progress Notes (Signed)
  Echocardiogram 2D Echocardiogram has been performed.  [name] L 06/21/2011, 12:06 PM

## 2011-06-21 NOTE — H&P (Signed)
[name] is an 66 y.o. female.   Chief Complaint: sob HPI: 66 yo female with copd on home o2, 2Lnc, Chf, presents with c/o sob x2 days, cough with greens sputum, pt has had subjective fever about 2 days.   Pt notes "achy" cp right sided and sscp", intermittently.  Pt has with cough occasionally, and also at different times. Recently had nuclear stress test 03/2011=>negative for ischemia, EF 35%.  Pt admits to some nausea,  But denies emesis, diarrhea, constipation, brbpr, black stool.  Pt came to ED for evaluation of sob, and cough.   ED course:  cxr showed ? Atelectasis vs infiltrate.    Past Medical History  Diagnosis Date  . RENAL INSUFFICIENCY 10/15/2008  . Edema 06/18/2008  . PNEUMONIA 12/01/2007  . CARPAL TUNNEL SYNDROME, BILATERAL 10/30/2007  . HAND PAIN, LEFT 09/04/2007  . Morbid obesity   . ANXIETY 11/08/2006  . HYPERTENSION 11/08/2006  . DIABETES MELLITUS, TYPE II 11/08/2006  . HYPERCHOLESTEROLEMIA 07/24/2009  . ANEMIA 07/24/2009  . COPD 11/08/2006    -HFA 50% p coaching 10/15/2009  . RESPIRATORY FAILURE, CHRONIC 10/15/2009  . BACK PAIN, LUMBAR 03/07/2009  . CHEST PAIN 07/24/2009  . Restless leg syndrome   . Systolic CHF, acute 03/24/2011  . Ischemic cardiomyopathy 03/26/2011    Past Surgical History  Procedure Date  . Abdominal hysterectomy 1987    Family History  Problem Relation Age of Onset  . Heart disease Mother   . Pancreatic cancer Mother   . Stroke Mother   . Asthma Paternal Grandfather   . Liver cancer Father   . Diabetes Father    Social History:  reports that she quit smoking about 3 years ago. She does not have any smokeless tobacco history on file. She reports that she does not drink alcohol or use illicit drugs.  Allergies:  Allergies  Allergen Reactions  . Pioglitazone     REACTION: edema  . Rosiglitazone Maleate Nausea And Vomiting     (Not in a hospital admission)  Results for orders placed during the hospital encounter of 06/20/11 (from  the past 48 hour(s))  CBC     Status: Abnormal   Collection Time   06/20/11 11:50 PM      Component Value Range Comment   WBC 14.3 (*) 4.0 - 10.5 (K/uL)    RBC 3.88  3.87 - 5.11 (MIL/uL)    Hemoglobin 11.1 (*) 12.0 - 15.0 (g/dL)    HCT 16.1 (*) 09.6 - 46.0 (%)    MCV 88.9  78.0 - 100.0 (fL)    MCH 28.6  26.0 - 34.0 (pg)    MCHC 32.2  30.0 - 36.0 (g/dL)    RDW 04.5  40.9 - 81.1 (%)    Platelets 204  150 - 400 (K/uL)   DIFFERENTIAL     Status: Abnormal   Collection Time   06/20/11 11:50 PM      Component Value Range Comment   Neutrophils Relative 90 (*) 43 - 77 (%)    Neutro Abs 12.8 (*) 1.7 - 7.7 (K/uL)    Lymphocytes Relative 5 (*) 12 - 46 (%)    Lymphs Abs 0.7  0.7 - 4.0 (K/uL)    Monocytes Relative 5  3 - 12 (%)    Monocytes Absolute 0.7  0.1 - 1.0 (K/uL)    Eosinophils Relative 0  0 - 5 (%)    Eosinophils Absolute 0.0  0.0 - 0.7 (K/uL)    Basophils Relative 0  0 - 1 (%)    Basophils Absolute 0.0  0.0 - 0.1 (K/uL)   CARDIAC PANEL(CRET KIN+CKTOT+MB+TROPI)     Status: Normal   Collection Time   06/20/11 11:50 PM      Component Value Range Comment   Total CK 98  7 - 177 (U/L)    CK, MB 3.0  0.3 - 4.0 (ng/mL)    Troponin I <0.30  <0.30 (ng/mL)    Relative Index RELATIVE INDEX IS INVALID  0.0 - 2.5    BASIC METABOLIC PANEL     Status: Abnormal   Collection Time   06/20/11 11:50 PM      Component Value Range Comment   Sodium 134 (*) 135 - 145 (mEq/L)    Potassium 4.6  3.5 - 5.1 (mEq/L)    Chloride 94 (*) 96 - 112 (mEq/L)    CO2 29  19 - 32 (mEq/L)    Glucose, Bld 193 (*) 70 - 99 (mg/dL)    BUN 28 (*) 6 - 23 (mg/dL)    Creatinine, Ser 1.61 (*) 0.50 - 1.10 (mg/dL)    Calcium 8.8  8.4 - 10.5 (mg/dL)    GFR calc non Af Amer 40 (*) >90 (mL/min)    GFR calc Af Amer 47 (*) >90 (mL/min)   PRO B NATRIURETIC PEPTIDE     Status: Abnormal   Collection Time   06/20/11 11:50 PM      Component Value Range Comment   Pro B Natriuretic peptide (BNP) 11256.0 (*) 0 - 125 (pg/mL)   URINALYSIS,  ROUTINE W REFLEX MICROSCOPIC     Status: Abnormal   Collection Time   06/21/11 12:12 AM      Component Value Range Comment   Color, Urine YELLOW  YELLOW     APPearance CLOUDY (*) CLEAR     Specific Gravity, Urine 1.020  1.005 - 1.030     pH 5.5  5.0 - 8.0     Glucose, UA NEGATIVE  NEGATIVE (mg/dL)    Hgb urine dipstick MODERATE (*) NEGATIVE     Bilirubin Urine NEGATIVE  NEGATIVE     Ketones, ur NEGATIVE  NEGATIVE (mg/dL)    Protein, ur >096 (*) NEGATIVE (mg/dL)    Urobilinogen, UA 0.2  0.0 - 1.0 (mg/dL)    Nitrite NEGATIVE  NEGATIVE     Leukocytes, UA NEGATIVE  NEGATIVE    URINE MICROSCOPIC-ADD ON     Status: Abnormal   Collection Time   06/21/11 12:12 AM      Component Value Range Comment   Squamous Epithelial / LPF RARE  RARE     RBC / HPF 3-6  <3 (RBC/hpf)    Bacteria, UA MANY (*) RARE     Casts HYALINE CASTS (*) NEGATIVE     Dg Chest Port 1 View  06/21/2011  *RADIOLOGY REPORT*  Clinical Data: Shortness of breath  PORTABLE CHEST - 1 VIEW  Comparison: 03/23/2011  Findings: Increased right greater than left lung base opacities. Cardiomegaly.  Central vascular congestion. Mild interstitial prominence.  The upper lobe nodular opacities are without significant interval change.  No acute osseous finding.  No pneumothorax.  IMPRESSION: Increased bibasilar opacity; atelectasis versus pneumonia.  Cardiomegaly with central vascular congestion.  Mild edema not excluded.  The known nodular opacities within the upper lobes are better characterized on comparison CT.  Original Report Authenticated By: Waneta Martins, M.D.    Review of Systems  Constitutional: Positive for fever. Negative for chills, weight loss and  malaise/fatigue.  HENT: Negative for hearing loss, ear pain, neck pain, tinnitus and ear discharge.   Eyes: Positive for blurred vision. Negative for double vision, photophobia and pain.       "got cataracts"  Respiratory: Positive for cough, sputum production and shortness of  breath. Negative for hemoptysis and wheezing.   Cardiovascular: Positive for chest pain. Negative for palpitations, orthopnea, claudication, leg swelling and PND.  Gastrointestinal: Positive for nausea. Negative for heartburn, vomiting, abdominal pain, diarrhea, constipation, blood in stool and melena.  Genitourinary: Positive for dysuria. Negative for urgency, frequency, hematuria and flank pain.  Musculoskeletal: Positive for joint pain. Negative for myalgias and back pain.  Skin: Positive for rash. Negative for itching.  Neurological: Negative for dizziness, tingling, tremors, sensory change, speech change, focal weakness, seizures, loss of consciousness and headaches.  Endo/Heme/Allergies: Negative for environmental allergies and polydipsia. Does not bruise/bleed easily.  Psychiatric/Behavioral: Negative for depression, suicidal ideas, hallucinations, memory loss and substance abuse. The patient is not nervous/anxious and does not have insomnia.     Blood pressure 142/65, pulse 118, temperature 98.5 F (36.9 C), temperature source Oral, resp. rate 22, SpO2 92.00%. Physical Exam  Constitutional: She is oriented to person, place, and time. She appears well-developed and well-nourished. No distress.  HENT:  Head: Normocephalic and atraumatic.  Mouth/Throat: No oropharyngeal exudate.  Eyes: Conjunctivae are normal. Pupils are equal, round, and reactive to light. Right eye exhibits no discharge. Left eye exhibits no discharge. No scleral icterus.  Neck: Normal range of motion. Neck supple. No JVD present. No tracheal deviation present. No thyromegaly present.  Cardiovascular: Normal rate, regular rhythm, normal heart sounds and intact distal pulses.  Exam reveals no gallop and no friction rub.   No murmur heard. Respiratory: Effort normal. No stridor. No respiratory distress. She has wheezes. She has rales. She exhibits no tenderness.       + crackles at right base  GI: Soft. Bowel sounds are  normal. She exhibits no distension and no mass. There is no tenderness. There is no rebound and no guarding.  Musculoskeletal: Normal range of motion. She exhibits no edema and no tenderness.  Lymphadenopathy:    She has no cervical adenopathy.  Neurological: She is alert and oriented to person, place, and time. She displays normal reflexes. No cranial nerve deficit. She exhibits normal muscle tone. Coordination normal.  Skin: Skin is warm and dry. No rash noted. She is not diaphoretic. No erythema. No pallor.  Psychiatric: She has a normal mood and affect. Her behavior is normal. Judgment and thought content normal.     Assessment/Plan Dyspnea: seconadry to pneumonia, ddx copd, mild chf tx with vanco, avelox Cont spiriva, symbicort, albuterol Cont lasix  Cp: Tele Check cpk, mb, trop i q4h x3 sets Consider cardiology input regarding cp  Tachycardia: check tsh, cycle cardiac markers Please consider check echo if not resolving D-dimer, if + then consider CTA chest or VQ scan  Copd on home o2: cont o2, and above inhalers  CHF h/o: (EF 35%), strict i and o, daily wt, lasix. losartan Bnp  CKD stage 2: stable Check cmp in am  Anemia: check cbc in am to ensure hgb stable  Dm2: cont novolog 70/30, iss   Delanie Tirrell 06/21/2011, 1:28 AM

## 2011-06-21 NOTE — Progress Notes (Signed)
Pharmacy - Lovenox dose adjustment  Pt ordered Lovenox 40mg  sq q24h (pharmacy may adjust). Scr 1.3, CrCl 55.4 ml/min. Pt obese, wt 120kg (BMI 41.4). CBC reviewed. Previous coagulation studies (119/2012) reviewed. Will increase Lovenox to 60mg  sq q24h (0.5mg /kg) based on obesity.  Gwen Her PharmD  [phone] 06/21/2011 6:42 AM

## 2011-06-21 NOTE — Care Management Note (Unsigned)
    Page 1 of 2   06/28/2011     5:45:14 PM   CARE MANAGEMENT NOTE 06/28/2011  Patient:  [name], [name]   Account Number:  [id number]  Date Initiated:  06/21/2011  Documentation initiated by:  [facility]  Subjective/Objective Assessment:   ADMITTED W/SOB.HX:CHF,COPD     Action/Plan:   FROM HOME.ACTIVE W/[facility]-HHRN/PT/OT.[facility]-HOME 02   Anticipated DC Date:  06/30/2011   Anticipated DC Plan:  HOME W HOME HEALTH SERVICES      DC Planning Services  CM consult      [facility] Choice  Resumption Of Svcs/PTA Provider   Choice offered to / List presented to:             Status of service:  In process, will continue to follow Medicare Important Message given?   (If response is "NO", the following Medicare IM given date fields will be blank) Date Medicare IM given:   Date Additional Medicare IM given:    Discharge Disposition:    Per UR Regulation:  Reviewed for med. necessity/level of care/duration of stay  If discussed at Long Length of Stay Meetings, dates discussed:    Comments:  06/28/11 Indiana Pechacek RN,BSN NCM [phone] CONTINUE TO MONITOR PROGRESS.[facility] FOLLOWING FOR RESUMPTION OF HH.PT-HH.  06/24/11 Ajai Harville RN,BSN NCM [phone] STILL ON IV LASIX.CONTINUE TO MONITOR FOR PROGRESS.[facility] FOLLOWING FOR RESUMPTION OF HH @ D/C.  06/21/11 Valmore Arabie RN,BSN NCM [phone] [facility] DEBBIE([facility])FOLLOWING FOR RESUMPTION OF HH.[facility] CONTACTED TEL#[phone]([place]) AWARE PATIENT MAY NEED TRAVEL SENT TO HOSP @ D/C.THEY WILL NEED PRIOR NOTICE IF HOME 02 NEEDED.MAY NEED TRANSPORTATION  ALSO.

## 2011-06-21 NOTE — Progress Notes (Signed)
D: Pt refused VQ x 2. A: Dr. Isidoro Donning notified. R: no new orders received. Maeola Harman

## 2011-06-21 NOTE — ED Provider Notes (Signed)
History     CSN: [id number]  Arrival date & time 06/20/11  2305   First MD Initiated Contact with Patient 06/20/11 2306      Chief Complaint  Patient presents with  . Shortness of Breath    (Consider location/radiation/quality/duration/timing/severity/associated sxs/prior treatment) HPI 66 yo female presents to the ER via EMS with complaint of increasing SOB, productive cough, and fevers,x 3days.  Pt with history of CHF, COPD.  Pt reports she was told on Tuesday she had UTI by home health nurse, but has not yet had abx called in by pcm.  Pt is on O2 chronically, has been using her home neb tx without improvement.  No sick contacts.  No admissions since March.  Pt has had intermittent central chest pains, sometimes with cough and sometimes without.  She reports diaphoresis with fevers, DOE.  No worsening abd or leg swelling.  No orthopnea.  Pt reports generalized fatigue.  Temps at home to 100 since Friday.  Pt has hoping sxs would improve over the weekend, but tonight was worse.  Past Medical History  Diagnosis Date  . RENAL INSUFFICIENCY 10/15/2008  . Edema 06/18/2008  . PNEUMONIA 12/01/2007  . CARPAL TUNNEL SYNDROME, BILATERAL 10/30/2007  . HAND PAIN, LEFT 09/04/2007  . Morbid obesity   . ANXIETY 11/08/2006  . HYPERTENSION 11/08/2006  . DIABETES MELLITUS, TYPE II 11/08/2006  . HYPERCHOLESTEROLEMIA 07/24/2009  . ANEMIA 07/24/2009  . COPD 11/08/2006    -HFA 50% p coaching 10/15/2009  . RESPIRATORY FAILURE, CHRONIC 10/15/2009  . BACK PAIN, LUMBAR 03/07/2009  . CHEST PAIN 07/24/2009  . Restless leg syndrome   . Systolic CHF, acute 03/24/2011  . Ischemic cardiomyopathy 03/26/2011    Past Surgical History  Procedure Date  . Abdominal hysterectomy     Family History  Problem Relation Age of Onset  . Heart disease Mother   . Pancreatic cancer Mother   . Stroke Mother   . Asthma Paternal Grandfather   . Liver cancer Father   . Diabetes Father     History  Substance Use Topics    . Smoking status: Former Smoker -- 2.0 packs/day for 40 years    Quit date: 01/12/2008  . Smokeless tobacco: Not on file  . Alcohol Use: No    OB History    Grav Para Term Preterm Abortions TAB SAB Ect Mult Living                  Review of Systems  Constitutional: Positive for fever, chills, diaphoresis and fatigue.  HENT: Negative.   Eyes: Negative.   Respiratory: Positive for cough, chest tightness, shortness of breath and wheezing.   Cardiovascular: Positive for chest pain. Negative for palpitations and leg swelling.  Gastrointestinal: Negative.   Genitourinary: Positive for frequency.  Musculoskeletal: Positive for myalgias and arthralgias.  Skin: Negative.   Neurological: Negative.   Hematological: Negative.   Psychiatric/Behavioral: Negative.     Allergies  Pioglitazone and Rosiglitazone maleate  Home Medications   Current Outpatient Rx  Name Route Sig Dispense Refill  . ALBUTEROL SULFATE HFA 108 (90 BASE) MCG/ACT IN AERS Inhalation Inhale 2 puffs into the lungs every 6 (six) hours as needed. For wheeze or shortness of breath    . ALBUTEROL SULFATE (2.5 MG/3ML) 0.083% IN NEBU  USE ONE VIAL IN NEBULIZER 4 TIMES DAILY OR EVERY 6 HOURS AS NEEDED 75 mL 4  . ALPRAZOLAM 0.25 MG PO TABS  TAKE ONE TABLET BY MOUTH TWICE DAILY AS NEEDED FOR  ANXIETY. DO NOT EXCEED 2 PER DAY 30 tablet 0  . AMLODIPINE BESYLATE 5 MG PO TABS Oral Take 1 tablet (5 mg total) by mouth daily. 30 tablet 3  . ASPIRIN 81 MG PO TABS Oral Take 81 mg by mouth daily.     Marland Kitchen CALCITRIOL 0.25 MCG PO CAPS Oral Take 0.25 mcg by mouth daily.      Marland Kitchen CITALOPRAM HYDROBROMIDE 40 MG PO TABS Oral Take 40 mg by mouth daily.      . FUROSEMIDE 40 MG PO TABS Oral Take 1 tablet (40 mg total) by mouth 2 (two) times daily. 60 tablet 3  . INSULIN ASPART PROT & ASPART (70-30) 100 UNIT/ML [place] SUSP Subcutaneous Inject 30 Units into the skin 2 (two) times daily with a meal. 60 mL 1  . LOSARTAN POTASSIUM 50 MG PO TABS Oral Take 1  tablet (50 mg total) by mouth daily. 30 tablet 3  . ONE-DAILY MULTI VITAMINS PO TABS Oral Take 1 tablet by mouth daily.     . NON FORMULARY  Oxygen 4LPM  24/7     . OVER THE COUNTER MEDICATION Oral Take 1 tablet by mouth daily. Iron tablet.    Marland Kitchen SPIRIVA HANDIHALER 18 MCG IN CAPS  INHALE ONE DOSE BY MOUTH EVERY DAY 30 each 2  . SYMBICORT 160-4.5 MCG/ACT IN AERO  INHALE TWO PUFFS BY MOUTH TWICE DAILY 11 g 3  . TRIAMCINOLONE ACETONIDE 0.1 % EX CREA Topical Apply topically 2 (two) times daily. 30 g 0  . ULTICARE INSULIN SYRINGE 31G X 5/16" 1 ML MISC  USE ONE TWICE DAILY 100 each 3    BP 142/65  Pulse 118  Temp(Src) 98.5 F (36.9 C) (Oral)  Resp 22  SpO2 92%  Physical Exam  Nursing note and vitals reviewed. Constitutional: She is oriented to person, place, and time. No distress.       Patient chronically ill appearing, noted to be tachycardic.  HENT:  Head: Normocephalic and atraumatic.  Nose: Nose normal.  Mouth/Throat: Oropharynx is clear and moist. No oropharyngeal exudate.  Eyes: Conjunctivae and EOM are normal. Pupils are equal, round, and reactive to light.  Neck: Normal range of motion. Neck supple. No JVD present. No tracheal deviation present. No thyromegaly present.  Cardiovascular: Regular rhythm, normal heart sounds and intact distal pulses.  Exam reveals no gallop and no friction rub.   No murmur heard.      Tachycardia  Pulmonary/Chest: No stridor. No respiratory distress. She has wheezes. She has rales. She exhibits no tenderness.       Frequent cough  Abdominal: Soft. Bowel sounds are normal. She exhibits no distension and no mass. There is no tenderness. There is no rebound and no guarding.  Musculoskeletal: She exhibits edema (trace edema). She exhibits no tenderness.  Lymphadenopathy:    She has no cervical adenopathy.  Neurological: She is alert and oriented to person, place, and time. She exhibits normal muscle tone. Coordination normal.  Skin: She is not  diaphoretic.  Psychiatric: She has a normal mood and affect. Her behavior is normal. Judgment and thought content normal.    ED Course  Procedures (including critical care time)  Labs Reviewed  CBC - Abnormal; Notable for the following:    WBC 14.3 (*)    Hemoglobin 11.1 (*)    HCT 34.5 (*)    All other components within normal limits  DIFFERENTIAL - Abnormal; Notable for the following:    Neutrophils Relative 90 (*)  Neutro Abs 12.8 (*)    Lymphocytes Relative 5 (*)    All other components within normal limits  BASIC METABOLIC PANEL - Abnormal; Notable for the following:    Sodium 134 (*)    Chloride 94 (*)    Glucose, Bld 193 (*)    BUN 28 (*)    Creatinine, Ser 1.34 (*)    GFR calc non Af Amer 40 (*)    GFR calc Af Amer 47 (*)    All other components within normal limits  PRO B NATRIURETIC PEPTIDE - Abnormal; Notable for the following:    Pro B Natriuretic peptide (BNP) 11256.0 (*)    All other components within normal limits  URINALYSIS, ROUTINE W REFLEX MICROSCOPIC - Abnormal; Notable for the following:    APPearance CLOUDY (*)    Hgb urine dipstick MODERATE (*)    Protein, ur >300 (*)    All other components within normal limits  URINE MICROSCOPIC-ADD ON - Abnormal; Notable for the following:    Bacteria, UA MANY (*)    Casts HYALINE CASTS (*)    All other components within normal limits  CARDIAC PANEL(CRET KIN+CKTOT+MB+TROPI)  URINE CULTURE  CULTURE, BLOOD (ROUTINE X 2)  CULTURE, BLOOD (ROUTINE X 2)   Dg Chest Port 1 View  06/21/2011  *RADIOLOGY REPORT*  Clinical Data: Shortness of breath  PORTABLE CHEST - 1 VIEW  Comparison: 03/23/2011  Findings: Increased right greater than left lung base opacities. Cardiomegaly.  Central vascular congestion. Mild interstitial prominence.  The upper lobe nodular opacities are without significant interval change.  No acute osseous finding.  No pneumothorax.  IMPRESSION: Increased bibasilar opacity; atelectasis versus pneumonia.   Cardiomegaly with central vascular congestion.  Mild edema not excluded.  The known nodular opacities within the upper lobes are better characterized on comparison CT.  Original Report Authenticated By: Waneta Martins, M.D.     Date: 06/20/2011  Rate: 121  Rhythm: sinus tachycardia  QRS Axis: left  Intervals: normal  ST/T Wave abnormalities: normal  Conduction Disutrbances:none  Narrative Interpretation:   Old EKG Reviewed: unchanged   1. Community acquired pneumonia   2. COPD exacerbation       MDM  66 year old female with worsening shortness of breath and cough with fever. Concern for the acquired pneumonia differential includes COPD exacerbation, bronchitis, CHF exacerbation, early sepsis, urinary tract infection. Chest x-ray concerning for pneumonia. White cell count elevated. Urine with bacteria but no white blood cells noted. Will send for culture and hold on treatment of this time. Will cover with moxifloxacin and discuss with hospitalist for admission        Olivia Mackie, MD 06/21/11 0130

## 2011-06-21 NOTE — Progress Notes (Signed)
Patient ID: [name]  female  ZOX:[id number]    DOB: [birth year]    DOA: 06/20/2011  PCP: Romero Belling, MD, MD  Subjective: Patient seen and examined this morning, extremely short of breath, placed on Xopenex and Atrovent nebs, Solu-Medrol, IV Lasix, on O2 via nasal cannula 5 L (at home chronically on 4 L)  Objective: Weight change:  No intake or output data in the 24 hours ending 06/21/11 1500 Blood pressure 134/74, pulse 124, temperature 98.2 F (36.8 C), temperature source Oral, resp. rate 24, height 5\' 7"  (1.702 m), weight 120 kg (264 lb 8.8 oz), SpO2 92.00%.  Physical Exam: General: Alert and awake, oriented x3, in moderate distress. HEENT: anicteric sclera, pupils reactive to light and accommodation, EOMI CVS: S1-S2 clear, no murmur rubs or gallops Chest: Diffuse wheezing bilaterally Abdomen: soft nontender, nondistended, normal bowel sounds, no organomegaly Extremities: no cyanosis, clubbing, 1+ edema noted bilaterally Neuro: Cranial nerves II-XII intact, no focal neurological deficits  Lab Results: Basic Metabolic Panel:  Lab 06/21/11 8119 06/20/11 2350  NA 134* 134*  K 4.6 4.6  CL 93* 94*  CO2 30 29  GLUCOSE 167* 193*  BUN 33* 28*  CREATININE 1.64* 1.34*  CALCIUM 8.9 8.8  MG -- --  PHOS -- --   Liver Function Tests:  Lab 06/21/11 0735  AST 20  ALT 15  ALKPHOS 47  BILITOT 0.3  PROT 7.6  ALBUMIN 3.3*   CBC:  Lab 06/21/11 0735 06/20/11 2350  WBC 13.4* 14.3*  NEUTROABS 10.8* --  HGB 10.9* 11.1*  HCT 33.2* 34.5*  MCV 88.8 88.9  PLT 197 204   Cardiac Enzymes:  Lab 06/21/11 0735 06/21/11 0212 06/20/11 2350  CKTOTAL 159 121 98  CKMB 4.7* 3.6 3.0  CKMBINDEX -- -- --  TROPONINI <0.30 <0.30 <0.30   BNP: CBG:  Lab 06/21/11 1211 06/21/11 0711  GLUCAP 153* 136*     Micro Results: No results found for this or any previous visit (from the past 240 hour(s)).  Studies/Results: Dg Chest Port 1 View  06/21/2011  *RADIOLOGY REPORT*  Clinical Data:  Shortness of breath  PORTABLE CHEST - 1 VIEW  Comparison: 03/23/2011  Findings: Increased right greater than left lung base opacities. Cardiomegaly.  Central vascular congestion. Mild interstitial prominence.  The upper lobe nodular opacities are without significant interval change.  No acute osseous finding.  No pneumothorax.  IMPRESSION: Increased bibasilar opacity; atelectasis versus pneumonia.  Cardiomegaly with central vascular congestion.  Mild edema not excluded.  The known nodular opacities within the upper lobes are better characterized on comparison CT.  Original Report Authenticated By: Waneta Martins, M.D.    Medications: Scheduled Meds:   . albuterol  2.5 mg Nebulization Once  . albuterol  2.5 mg Nebulization Once  . amLODipine  5 mg Oral Daily  . aspirin EC  81 mg Oral Daily  . budesonide-formoterol  2 puff Inhalation BID  . calcitRIOL  0.25 mcg Oral Daily  . citalopram  40 mg Oral Daily  . enoxaparin (LOVENOX) injection  60 mg Subcutaneous Q24H  . ferrous sulfate  325 mg Oral Q breakfast  . furosemide  40 mg Intravenous BID  . insulin aspart  0-5 Units Subcutaneous QHS  . insulin aspart  0-9 Units Subcutaneous TID WC  . insulin aspart protamine-insulin aspart  30 Units Subcutaneous BID WC  . ipratropium  0.5 mg Nebulization Once  . ipratropium  0.5 mg Nebulization Q6H  . levalbuterol  0.63 mg Nebulization Q6H  .  losartan  50 mg Oral Daily  . methylPREDNISolone (SOLU-MEDROL) injection  60 mg Intravenous Q6H  . moxifloxacin  400 mg Intravenous Once  . multivitamin with minerals  1 tablet Oral Daily  . sodium chloride  3 mL Intravenous Q12H  . DISCONTD: albuterol  2.5 mg Nebulization QID  . DISCONTD: enoxaparin  40 mg Subcutaneous Q24H  . DISCONTD: furosemide  40 mg Oral BID  . DISCONTD: tiotropium  18 mcg Inhalation Daily   Continuous Infusions:    Assessment/Plan: Principal Problem:  *Respiratory failure, acute-on-chronic: Multifactorial, with a history of  chronic systolic CHF, coronary artery disease, COPD in chronic respiratory failure - Place on scheduled to Xopenex and Atrovent nebs, O2 via nasal cannula 5 L, IV Solu-Medrol, Symbicort and IV Avelox, d-dimer elevated, ordered VQ scan - Placed on incentive spirometry  Active Problems:  CAD (coronary artery disease) - Cardiac enzymes x3 negative, ordered echocardiogram, given elevated BNP 13733 - Continue aspirin, not on beta blocker outpatient likely secondary to chronic lung disease - Holding losartan secondary to increasing creatinine   Healthcare-associated pneumonia: - Placed on vancomycin and IV Avelox, and as #1   COPD with acute exacerbation: Diffusely wheezing - As #1   Systolic CHF, acute on chronic: - BNP elevated, ordered 2-D echocardiogram, placed on IV Lasix x4 doses, I/O's, daily weights - Patient's cardiologist is Dr. Gala Romney, will consult if no significant improvement   Diabetes mellitus - Obtain hemoglobin A1c, continue sliding scale insulin and NovoLog 70/30,  Acute renal insufficiency - Hold losartan, cautious IV diuresis    DVT Prophylaxis: Lovenox  Code Status:  Disposition: Not medically ready   LOS: 1 day   Devyne Hauger M.D. [facility] 06/21/2011, 3:00 PM Pager: [phone]

## 2011-06-21 NOTE — Progress Notes (Signed)
ANTIBIOTIC CONSULT NOTE - INITIAL  Pharmacy Consult for Vancomycin Indication: rule out pneumonia  Allergies  Allergen Reactions  . Pioglitazone     REACTION: edema  . Rosiglitazone Maleate Nausea And Vomiting    Patient Measurements: Height: 5\' 7"  (170.2 cm) Weight: 264 lb 8.8 oz (120 kg) IBW/kg (Calculated) : 61.6   Vital Signs: Temp: 98.3 F (36.8 C) (06/10 1510) Temp src: Oral (06/10 1510) BP: 150/103 mmHg (06/10 1510) Pulse Rate: 58  (06/10 1510)  Labs:  [facility] 06/21/11 0735 06/20/11 2350  WBC 13.4* 14.3*  HGB 10.9* 11.1*  PLT 197 204  LABCREA -- --  CREATININE 1.64* 1.34*   Estimated Creatinine Clearance: 45.3 ml/min (by C-G formula based on Cr of 1.64). No results found for this basename: VANCOTROUGH:2,VANCOPEAK:2,VANCORANDOM:2,GENTTROUGH:2,GENTPEAK:2,GENTRANDOM:2,TOBRATROUGH:2,TOBRAPEAK:2,TOBRARND:2,AMIKACINPEAK:2,AMIKACINTROU:2,AMIKACIN:2, in the last 72 hours   Microbiology: No results found for this or any previous visit (from the past 720 hour(s)).  Medical History: Past Medical History  Diagnosis Date  . RENAL INSUFFICIENCY 10/15/2008  . Edema 06/18/2008  . PNEUMONIA 12/01/2007  . CARPAL TUNNEL SYNDROME, BILATERAL 10/30/2007  . HAND PAIN, LEFT 09/04/2007  . Morbid obesity   . ANXIETY 11/08/2006  . HYPERTENSION 11/08/2006  . DIABETES MELLITUS, TYPE II 11/08/2006  . HYPERCHOLESTEROLEMIA 07/24/2009  . ANEMIA 07/24/2009  . COPD 11/08/2006    -HFA 50% p coaching 10/15/2009  . RESPIRATORY FAILURE, CHRONIC 10/15/2009  . BACK PAIN, LUMBAR 03/07/2009  . CHEST PAIN 07/24/2009  . Restless leg syndrome   . Systolic CHF, acute 03/24/2011  . Ischemic cardiomyopathy 03/26/2011    Medications:  Anti-infectives     Start     Dose/Rate Route Frequency Ordered Stop   06/21/11 0045   moxifloxacin (AVELOX) IVPB 400 mg        400 mg 250 mL/hr over 60 Minutes Intravenous  Once 06/21/11 0044 06/21/11 0319         Assessment:  66 yo F admit from home with acute  on chronic respiratory failure.  Moxifloxacin given x1 dose on 6/10  Vancomycin per pharmacy requested for r/o HCAP  Acute renal insufficiency with CrCl (n) ~ 36 ml/min  Goal of Therapy:  Vancomycin trough level 15-20 mcg/ml  Plan:   Vancomycin 1500mg  IV q24h.  Measure Vanc trough at steady state.  Follow up renal fxn and culture results.  Lynann Beaver PharmD, BCPS Pager [phone] 06/21/2011 3:29 PM

## 2011-06-22 DIAGNOSIS — D696 Thrombocytopenia, unspecified: Secondary | ICD-10-CM

## 2011-06-22 DIAGNOSIS — I5023 Acute on chronic systolic (congestive) heart failure: Secondary | ICD-10-CM

## 2011-06-22 DIAGNOSIS — J9620 Acute and chronic respiratory failure, unspecified whether with hypoxia or hypercapnia: Secondary | ICD-10-CM

## 2011-06-22 DIAGNOSIS — J441 Chronic obstructive pulmonary disease with (acute) exacerbation: Secondary | ICD-10-CM

## 2011-06-22 LAB — GLUCOSE, CAPILLARY
Glucose-Capillary: 273 mg/dL — ABNORMAL HIGH (ref 70–99)
Glucose-Capillary: 298 mg/dL — ABNORMAL HIGH (ref 70–99)
Glucose-Capillary: 303 mg/dL — ABNORMAL HIGH (ref 70–99)

## 2011-06-22 LAB — URINE CULTURE

## 2011-06-22 MED ORDER — INSULIN ASPART 100 UNIT/ML ~~LOC~~ SOLN: 0.0000 [IU] | Freq: Every day | SUBCUTANEOUS | Status: DC

## 2011-06-22 MED ORDER — INSULIN ASPART 100 UNIT/ML ~~LOC~~ SOLN
0.0000 [IU] | Freq: Three times a day (TID) | SUBCUTANEOUS | Status: DC
  Administered 2011-06-22: 11 [IU] via SUBCUTANEOUS
  Administered 2011-06-23: 5 [IU] via SUBCUTANEOUS
  Administered 2011-06-23 (×2): 15 [IU] via SUBCUTANEOUS
  Administered 2011-06-24 (×2): 8 [IU] via SUBCUTANEOUS
  Administered 2011-06-24: 5 [IU] via SUBCUTANEOUS
  Administered 2011-06-25 (×2): 8 [IU] via SUBCUTANEOUS
  Administered 2011-06-25: 3 [IU] via SUBCUTANEOUS
  Administered 2011-06-26: 5 [IU] via SUBCUTANEOUS
  Administered 2011-06-26: 15 [IU] via SUBCUTANEOUS
  Administered 2011-06-26 – 2011-06-27 (×2): 2 [IU] via SUBCUTANEOUS
  Administered 2011-06-27: 15 [IU] via SUBCUTANEOUS
  Administered 2011-06-28: 5 [IU] via SUBCUTANEOUS
  Administered 2011-06-28: 8 [IU] via SUBCUTANEOUS
  Administered 2011-06-29: 5 [IU] via SUBCUTANEOUS
  Administered 2011-06-30: 8 [IU] via SUBCUTANEOUS
  Administered 2011-06-30: 2 [IU] via SUBCUTANEOUS
  Administered 2011-07-01: 3 [IU] via SUBCUTANEOUS

## 2011-06-22 MED ORDER — FUROSEMIDE 10 MG/ML IJ SOLN
40.0000 mg | Freq: Once | INTRAMUSCULAR | Status: AC
  Administered 2011-06-22: 40 mg via INTRAVENOUS
  Filled 2011-06-22: qty 4

## 2011-06-22 MED ORDER — INSULIN ASPART PROT & ASPART (70-30 MIX) 100 UNIT/ML ~~LOC~~ SUSP
35.0000 [IU] | Freq: Two times a day (BID) | SUBCUTANEOUS | Status: DC
  Administered 2011-06-22 – 2011-06-23 (×3): 35 [IU] via SUBCUTANEOUS

## 2011-06-22 NOTE — Clinical Documentation Improvement (Signed)
CKD DOCUMENTATION CLARIFICATION QUERY   THIS DOCUMENT IS NOT A PERMANENT PART OF THE MEDICAL RECORD  TO RESPOND TO THE THIS QUERY, FOLLOW THE INSTRUCTIONS BELOW:  1. If needed, update documentation for the patient's encounter via the notes activity.  2. Access this query again and click edit on the In [employer].  3. After updating, or not, click F2 to complete all highlighted (required) fields concerning your review. Select "additional documentation in the medical record" OR "n 4.  additional documentation provided".  5. Click Sign note button.  6. The deficiency will fall out of your In Basket *Please let us know if you are not able to complete this workflow by phone or e-mail (listed below).  Please update your documentation within the medical record to reflect your response to this query.                                                                                        06/22/11   Dear Dr. Isidoro Donning, R[facility],  In a better effort to capture your patient's severity of illness, reflect appropriate length of stay and utilization of resources, a review of the patient medical record has revealed the following indicators.    Based on your clinical judgment, please clarify and document in a progress note and/or discharge summary the clinical condition associated with the following supporting information:  In responding to this query please exercise your independent judgment.  The fact that a query is asked, does not imply that any particular answer is desired or expected.  Pt admitted with PNA, Acute Resp Failure and Acute/Chronic Systolic CHF.  According to pn 06/21/11 pt with Acute renal insufficiency in setting of BUN/CR/GFR of 33/1.64/32   For specificity of clinical documentation please clarify if " Acute renal insufficiency" can qualify as one of the diagnosis listed below and document in the pn or d/c summary.  Thank you for all that you do for our patients!   Possible  Clinical Conditions?   _______Acute Renal Failure _______CKD Stage I -  GFR > OR = 90 _______CKD Stage II - GFR 60-80 _______CKD Stage III - GFR 30-59 _______CKD Stage IV - GFR 15-29 _______CKD Stage V - GFR < 15 _______ESRD (End Stage Renal Disease) _______Other condition_____________ _______Cannot Clinically determine   Supporting Information:  Risk Factors:   PNA, Acute Resp Failure and Acute/Chronic Systolic CHF. Acute Renal Insufficiency  Signs & Symptoms: PN 06/21/11 Acute renal insufficiency - Hold losartan, cautious IV diuresis    Diagnostics:  Component     Latest Ref Rng 06/21/2011  BUN     6 - 23 mg/dL 33 (H)  Creat     4.09 - 1.10 mg/dL 8.11 (H)   Component     Latest Ref Rng 06/20/2011  BUN     6 - 23 mg/dL 28 (H)  Creat     9.14 - 1.10 mg/dL 7.82 (H)    Component     Latest Ref Rng 06/21/2011  GFR calc non Af Amer     >90 mL/min 32 (L)   Component     Latest Ref Rng 06/20/2011  GFR calc non Af  Amer     >90 mL/min 40 (L)   Urine:  Component     Latest Ref Rng 06/21/2011  Casts     NEGATIVE HYALINE CASTS (A)    Treatment: Monitoring cautious IV diuresis pn 06/21/11  You may use possible, probable, or suspect with inpatient documentation. possible, probable, suspected diagnoses MUST be documented at the time of discharge  Reviewed:  no additional documentation provided ljh   Thank You,  Enis Slipper  RN, BSN, CCDS Clinical Documentation Specialist [facility] Dept Pager: [phone] / E-mail: [email or web address]  Health Information Management [place]

## 2011-06-22 NOTE — Progress Notes (Signed)
Inpatient Diabetes Program Recommendations  AACE/ADA: New Consensus Statement on Inpatient Glycemic Control (2009)  Target Ranges:  Prepandial:   less than 140 mg/dL      Peak postprandial:   less than 180 mg/dL (1-2 hours)      Critically ill patients:  140 - 180 mg/dL   Reason for Visit: Hyperglycemia Results for AKEMI, OVERHOLSER (MRN [id number]) as of 06/22/2011 13:57  Ref. Range 06/21/2011 18:20 06/21/2011 21:09 06/22/2011 07:42 06/22/2011 11:45  Glucose-Capillary Latest Range: 70-99 mg/dL 409 (H) 811 (H) 914 (H) 298 (H)  Results for FLOREAN, HOOBLER (MRN [id number]) as of 06/22/2011 13:57  Ref. Range 06/21/2011 00:00  Hemoglobin A1C Latest Range: <5.7 % 7.5 (H)    Inpatient Diabetes Program Recommendations Insulin - Basal: Increase 70/30 to 35 units bid  Note: Steroid-induced hyperglycemia.  Fairly good control at home. Discussed with Dr. Isidoro Donning and 70/30 increased to 35 units bid.  Will continue to follow.

## 2011-06-22 NOTE — Progress Notes (Signed)
Patient ID: [name]  female  JYN:[id number]    DOB: 06-15-1945    DOA: 06/20/2011  PCP: Romero Belling, MD, MD  Subjective: Patient seen and examined this morning, still short of breath  Objective: Weight change: 2 kg (4 lb 6.6 oz)  Intake/Output Summary (Last 24 hours) at 06/22/11 1501 Last data filed at 06/22/11 1400  Gross per 24 hour  Intake   1207 ml  Output    950 ml  Net    257 ml   Blood pressure 168/73, pulse 106, temperature 97.5 F (36.4 C), temperature source Oral, resp. rate 20, height 5\' 7"  (1.702 m), weight 122 kg (268 lb 15.4 oz), SpO2 91.00%.  Physical Exam: General: Alert and awake, oriented x3, in moderate distress. HEENT: anicteric sclera, pupils reactive to light and accommodation, EOMI CVS: S1-S2 clear, no murmur rubs or gallops Chest: Diffuse wheezing bilaterally improving Abdomen: soft nontender, nondistended, normal bowel sounds, no organomegaly Extremities: no cyanosis, clubbing, 1+ edema noted bilaterally Neuro: Cranial nerves II-XII intact, no focal neurological deficits  Lab Results: Basic Metabolic Panel:  Lab 06/21/11 8657 06/20/11 2350  NA 134* 134*  K 4.6 4.6  CL 93* 94*  CO2 30 29  GLUCOSE 167* 193*  BUN 33* 28*  CREATININE 1.64* 1.34*  CALCIUM 8.9 8.8  MG -- --  PHOS -- --   Liver Function Tests:  Lab 06/21/11 0735  AST 20  ALT 15  ALKPHOS 47  BILITOT 0.3  PROT 7.6  ALBUMIN 3.3*   CBC:  Lab 06/21/11 0735 06/20/11 2350  WBC 13.4* 14.3*  NEUTROABS 10.8* --  HGB 10.9* 11.1*  HCT 33.2* 34.5*  MCV 88.8 88.9  PLT 197 204   Cardiac Enzymes:  Lab 06/21/11 0735 06/21/11 0212 06/20/11 2350  CKTOTAL 159 121 98  CKMB 4.7* 3.6 3.0  CKMBINDEX -- -- --  TROPONINI <0.30 <0.30 <0.30   BNP: CBG:  Lab 06/22/11 1145 06/22/11 0742 06/21/11 2109 06/21/11 1820 06/21/11 1211  GLUCAP 298* 273* 360* 269* 153*     Micro Results: Recent Results (from the past 240 hour(s))  URINE CULTURE     Status: Normal   Collection Time   06/21/11 12:12 AM      Component Value Range Status Comment   Specimen Description URINE, CLEAN CATCH   Final    Special Requests NONE   Final    Culture  Setup Time [id number]   Final    Colony Count 9,000 COLONIES/ML   Final    Culture INSIGNIFICANT GROWTH   Final    Report Status 06/22/2011 FINAL   Final   CULTURE, BLOOD (ROUTINE X 2)     Status: Normal (Preliminary result)   Collection Time   06/21/11  1:06 AM      Component Value Range Status Comment   Specimen Description BLOOD RIGHT ARM   Final    Special Requests BOTTLES DRAWN AEROBIC AND ANAEROBIC 5 CC EA   Final    Culture  Setup Time [id number]   Final    Culture     Final    Value:        BLOOD CULTURE RECEIVED NO GROWTH TO DATE CULTURE WILL BE HELD FOR 5 DAYS BEFORE ISSUING A FINAL NEGATIVE REPORT   Report Status PENDING   Incomplete   CULTURE, BLOOD (ROUTINE X 2)     Status: Normal (Preliminary result)   Collection Time   06/21/11  1:15 AM      Component Value Range  Status Comment   Specimen Description BLOOD RIGHT ANTECUBITAL   Final    Special Requests BOTTLES DRAWN AEROBIC AND ANAEROBIC 5 CC EA   Final    Culture  Setup Time [id number]   Final    Culture     Final    Value:        BLOOD CULTURE RECEIVED NO GROWTH TO DATE CULTURE WILL BE HELD FOR 5 DAYS BEFORE ISSUING A FINAL NEGATIVE REPORT   Report Status PENDING   Incomplete     Studies/Results: Dg Chest Port 1 View  06/21/2011  *RADIOLOGY REPORT*  Clinical Data: Shortness of breath  PORTABLE CHEST - 1 VIEW  Comparison: 03/23/2011  Findings: Increased right greater than left lung base opacities. Cardiomegaly.  Central vascular congestion. Mild interstitial prominence.  The upper lobe nodular opacities are without significant interval change.  No acute osseous finding.  No pneumothorax.  IMPRESSION: Increased bibasilar opacity; atelectasis versus pneumonia.  Cardiomegaly with central vascular congestion.  Mild edema not excluded.  The known nodular opacities  within the upper lobes are better characterized on comparison CT.  Original Report Authenticated By: Waneta Martins, M.D.    Medications: Scheduled Meds:    . amLODipine  5 mg Oral Daily  . aspirin EC  81 mg Oral Daily  . budesonide-formoterol  2 puff Inhalation BID  . calcitRIOL  0.25 mcg Oral Daily  . citalopram  40 mg Oral Daily  . enoxaparin (LOVENOX) injection  60 mg Subcutaneous Q24H  . ferrous sulfate  325 mg Oral Q breakfast  . furosemide  40 mg Intravenous BID  . furosemide  40 mg Intravenous Once  . insulin aspart  0-15 Units Subcutaneous TID WC  . insulin aspart  0-5 Units Subcutaneous QHS  . insulin aspart protamine-insulin aspart  35 Units Subcutaneous BID WC  . ipratropium  0.5 mg Nebulization Q6H  . levalbuterol  0.63 mg Nebulization Q6H  . methylPREDNISolone (SOLU-MEDROL) injection  60 mg Intravenous Q6H  . moxifloxacin  400 mg Intravenous Q24H  . multivitamin with minerals  1 tablet Oral Daily  . sodium chloride  3 mL Intravenous Q12H  . vancomycin  1,500 mg Intravenous Q24H  . DISCONTD: furosemide  40 mg Intravenous BID  . DISCONTD: insulin aspart  0-5 Units Subcutaneous QHS  . DISCONTD: insulin aspart  0-9 Units Subcutaneous TID WC  . DISCONTD: insulin aspart protamine-insulin aspart  30 Units Subcutaneous BID WC  . DISCONTD: losartan  50 mg Oral Daily   Continuous Infusions:    Assessment/Plan: Principal Problem:  *Respiratory failure, acute-on-chronic: Multifactorial, with a history of chronic systolic CHF, coronary artery disease, COPD in chronic respiratory failure - Continue scheduled Xopenex and Atrovent nebs, O2 via [place] 5 L, IV Solu-Medrol, Symbicort and IV Avelox, d-dimer elevated, ordered VQ scan however patient refused VQ scan due to dyspnea and unable to lie flat - Continue incentive spirometry, taper IV Solu-Medrol tomorrow if improving  Active Problems:  CAD (coronary artery disease) - Cardiac enzymes x3 negative, - 2-D echo with EF of  30-35%, moderately to severely reduced systolic function (no significant difference from previous ECHO 3/13)  - Continue aspirin, not on beta blocker outpatient likely secondary to chronic lung disease - Holding losartan secondary to increasing creatinine   Healthcare-associated pneumonia: - Continue on vancomycin and IV Avelox, and as #1   COPD with acute exacerbation: Wheezing improving - As #1   Systolic CHF, acute on chronic: despite diuresis, weight up by 4 lbs, still on  5l O2 [place]  - BNP elevated, one extra dose of IV Lasix today, on IV Lasix 40 mg BID , I/O's, daily weights,  - Creatinine 1.7 today, may change to Lasix PO tomorrow if worsening  - Patient's cardiologist is Dr. Gala Romney, will consult if no significant improvement   Diabetes mellitus: A1c 7.5 - Uncontrolled likely scheduled to steroids, change to moderate sliding scale insulin and increase NovoLog 70/30, 35 units twice a day  Acute renal insufficiency - Hold losartan, cautious IV diuresis    DVT Prophylaxis: Lovenox  Code Status:  Disposition: Not medically ready   LOS: 2 days   Noheli Melder M.D. [facility] 06/22/2011, 3:01 PM Pager: [phone]

## 2011-06-23 ENCOUNTER — Inpatient Hospital Stay (HOSPITAL_COMMUNITY): Payer: Medicare Other

## 2011-06-23 ENCOUNTER — Encounter: Payer: Self-pay | Admitting: Endocrinology

## 2011-06-23 DIAGNOSIS — D696 Thrombocytopenia, unspecified: Secondary | ICD-10-CM

## 2011-06-23 DIAGNOSIS — I5023 Acute on chronic systolic (congestive) heart failure: Secondary | ICD-10-CM

## 2011-06-23 DIAGNOSIS — J441 Chronic obstructive pulmonary disease with (acute) exacerbation: Secondary | ICD-10-CM

## 2011-06-23 DIAGNOSIS — J9620 Acute and chronic respiratory failure, unspecified whether with hypoxia or hypercapnia: Secondary | ICD-10-CM

## 2011-06-23 LAB — CREATININE, SERUM
Creatinine, Ser: 1.52 mg/dL — ABNORMAL HIGH (ref 0.50–1.10)
GFR calc Af Amer: 40 mL/min — ABNORMAL LOW (ref >90)
GFR calc non Af Amer: 35 mL/min — ABNORMAL LOW (ref >90)

## 2011-06-23 LAB — GLUCOSE, CAPILLARY: Glucose-Capillary: 375 mg/dL — ABNORMAL HIGH (ref 70–99)

## 2011-06-23 LAB — PRO B NATRIURETIC PEPTIDE: Pro B Natriuretic peptide (BNP): 5844 pg/mL — ABNORMAL HIGH (ref 0–125)

## 2011-06-23 LAB — VANCOMYCIN, TROUGH: Vancomycin Tr: 15.7 ug/mL (ref 10.0–20.0)

## 2011-06-23 MED ORDER — FUROSEMIDE 10 MG/ML IJ SOLN
40.0000 mg | Freq: Two times a day (BID) | INTRAMUSCULAR | Status: DC
  Filled 2011-06-23 (×2): qty 4

## 2011-06-23 MED ORDER — FUROSEMIDE 10 MG/ML IJ SOLN
40.0000 mg | Freq: Two times a day (BID) | INTRAMUSCULAR | Status: DC
  Administered 2011-06-23 – 2011-06-25 (×5): 40 mg via INTRAVENOUS
  Filled 2011-06-23 (×6): qty 4

## 2011-06-23 MED ORDER — FUROSEMIDE 10 MG/ML IJ SOLN
40.0000 mg | Freq: Once | INTRAMUSCULAR | Status: AC
  Administered 2011-06-23: 40 mg via INTRAVENOUS
  Filled 2011-06-23: qty 4

## 2011-06-23 MED ORDER — FUROSEMIDE 10 MG/ML IJ SOLN
40.0000 mg | Freq: Once | INTRAMUSCULAR | Status: DC
  Filled 2011-06-23: qty 4

## 2011-06-23 MED ORDER — FUROSEMIDE 10 MG/ML IJ SOLN: 40.0000 mg | Freq: Two times a day (BID) | INTRAMUSCULAR | Status: DC

## 2011-06-23 MED ORDER — LORAZEPAM 2 MG/ML IJ SOLN: 1.0000 mg | Freq: Once | INTRAMUSCULAR | Status: DC

## 2011-06-23 MED ORDER — ALPRAZOLAM 0.25 MG PO TABS
0.2500 mg | ORAL_TABLET | Freq: Once | ORAL | Status: AC
  Administered 2011-06-23: 0.25 mg via ORAL
  Filled 2011-06-23: qty 1

## 2011-06-23 MED ORDER — METHYLPREDNISOLONE SODIUM SUCC 125 MG IJ SOLR
60.0000 mg | Freq: Two times a day (BID) | INTRAMUSCULAR | Status: DC
  Administered 2011-06-24 – 2011-06-25 (×4): 60 mg via INTRAVENOUS
  Filled 2011-06-23 (×5): qty 0.96

## 2011-06-23 MED ORDER — SALINE SPRAY 0.65 % NA SOLN
1.0000 | NASAL | Status: DC | PRN
  Administered 2011-06-23: 1 via NASAL
  Filled 2011-06-23: qty 44

## 2011-06-23 MED ORDER — FUROSEMIDE 40 MG PO TABS
40.0000 mg | ORAL_TABLET | Freq: Two times a day (BID) | ORAL | Status: DC
  Filled 2011-06-23 (×2): qty 1

## 2011-06-23 NOTE — Progress Notes (Signed)
I have seen and examined the patient at bedside. Pt reports slight improvement in shortness of breath. She still has bibasilar crackles and is not tachypneic at the time of the examination. She is maintaining oxygen saturations > 91% on 2 L [place]. Will continue diuresis and it appears that pt is diuresing well. Negative 1.8 L and with BNP >13,000 0--> 5844 today.  Will obtain CXR in AM and will continue to monitor vitals per floor protocol.   Debbora Presto [facility], pager #: [phone] Main office number: [phone]

## 2011-06-23 NOTE — Progress Notes (Signed)
Inpatient Diabetes Program Recommendations  AACE/ADA: New Consensus Statement on Inpatient Glycemic Control (2009)  Target Ranges:  Prepandial:   less than 140 mg/dL      Peak postprandial:   less than 180 mg/dL (1-2 hours)      Critically ill patients:  140 - 180 mg/dL   Reason for Visit: Hyperglycemia  Results for [name], [name] (MRN [id number]) as of 06/23/2011 16:22  Ref. Range 06/22/2011 11:45 06/22/2011 17:19 06/22/2011 21:56 06/23/2011 07:30 06/23/2011 11:52  Glucose-Capillary Latest Range: 70-99 mg/dL 782 (H) 956 (H) 213 (H) 372 (H) 375 (H)      Recommendations:  Increase 70/30 to 45 units bid.  Will follow.  Eating approx 80% meals.

## 2011-06-23 NOTE — Progress Notes (Signed)
Subjective: Sitting on side of bed. Moderate increased work of breathing. Pursed lip breathing.   Objective: Vital signs Filed Vitals:   06/22/11 2116 06/23/11 0215 06/23/11 0500 06/23/11 0523  BP: 128/74   124/72  Pulse: 105   96  Temp: 98 F (36.7 C)   97.6 F (36.4 C)  TempSrc: Oral   Oral  Resp: 18   20  Height:      Weight:   123.2 kg (271 lb 9.7 oz)   SpO2: 95% 97%  97%   Weight change: 1.2 kg (2 lb 10.3 oz) Last BM Date: 06/22/11  Intake/Output from previous day: 06/11 0701 - 06/12 0700 In: 1214 [P.O.:700; I.V.:6; IV Piggyback:508] Out: 2651 [Urine:2650; Stool:1] Total I/O In: -  Out: 700 [Urine:700]   Physical Exam: General: Alert, awake, oriented x3 well nourished mild-moderate distress HEENT: No bruits, no goiter. PERRL Mucus membranes moist/pink Heart: Regular rate and rhythm, without murmurs, rubs, gallops. Lungs:Mild to moderate increased work of breathing. pursed lip breathing and using abdominal accessory muscles. Dry tight cough during exam. Non productive. Breath sounds with expiratory coarse, low pitched wheeze particularly on right.  Abdomen: Obese, Soft, nontender, nondistended, positive bowel sounds. Extremities: No clubbing cyanosis with positive pedal pulses. Trace -1+ LEE Neuro: Grossly intact, nonfocal. Speech clear.     Lab Results: Basic Metabolic Panel:  Basename 06/23/11 0538 06/21/11 0735 06/20/11 2350  NA -- 134* 134*  K -- 4.6 4.6  CL -- 93* 94*  CO2 -- 30 29  GLUCOSE -- 167* 193*  BUN -- 33* 28*  CREATININE 1.52* 1.64* --  CALCIUM -- 8.9 8.8  MG -- -- --  PHOS -- -- --   Liver Function Tests:  [facility] 06/21/11 0735  AST 20  ALT 15  ALKPHOS 47  BILITOT 0.3  PROT 7.6  ALBUMIN 3.3*   No results found for this basename: LIPASE:2,AMYLASE:2 in the last 72 hours No results found for this basename: AMMONIA:2 in the last 72 hours CBC:  Basename 06/21/11 0735 06/20/11 2350  WBC 13.4* 14.3*  NEUTROABS 10.8* 12.8*  HGB  10.9* 11.1*  HCT 33.2* 34.5*  MCV 88.8 88.9  PLT 197 204   Cardiac Enzymes:  Basename 06/21/11 0735 06/21/11 0212 06/20/11 2350  CKTOTAL 159 121 98  CKMB 4.7* 3.6 3.0  CKMBINDEX -- -- --  TROPONINI <0.30 <0.30 <0.30   BNP:  Basename 06/23/11 0538 06/21/11 0735 06/20/11 2350  PROBNP 5844.0* 13733.0* 11256.0*   D-Dimer:  Basename 06/21/11  DDIMER 0.74*   CBG:  Basename 06/23/11 0730 06/22/11 2156 06/22/11 1719 06/22/11 1145 06/22/11 0742 06/21/11 2109  GLUCAP 372* 278* 303* 298* 273* 360*   Hemoglobin A1C:  Basename 06/21/11  HGBA1C 7.5*   Fasting Lipid Panel: No results found for this basename: CHOL,HDL,LDLCALC,TRIG,CHOLHDL,LDLDIRECT in the last 72 hours Thyroid Function Tests:  Basename 06/21/11 0735  TSH 0.716  T4TOTAL --  FREET4 --  T3FREE --  THYROIDAB --   Anemia Panel: No results found for this basename: VITAMINB12,FOLATE,FERRITIN,TIBC,IRON,RETICCTPCT in the last 72 hours Coagulation:  Basename 06/21/11 0735  LABPROT 14.2  INR 1.08   Urine Drug Screen: Drugs of Abuse  No results found for this basename: labopia, cocainscrnur, labbenz, amphetmu, thcu, labbarb    Alcohol Level: No results found for this basename: ETH:2 in the last 72 hours Urinalysis:  Basename 06/21/11 0012  COLORURINE YELLOW  LABSPEC 1.020  PHURINE 5.5  GLUCOSEU NEGATIVE  HGBUR MODERATE*  BILIRUBINUR NEGATIVE  KETONESUR NEGATIVE  PROTEINUR >300*  UROBILINOGEN 0.2  NITRITE NEGATIVE  LEUKOCYTESUR NEGATIVE   Misc. Labs:  Recent Results (from the past 240 hour(s))  URINE CULTURE     Status: Normal   Collection Time   06/21/11 12:12 AM      Component Value Range Status Comment   Specimen Description URINE, CLEAN CATCH   Final    Special Requests NONE   Final    Culture  Setup Time [id number]   Final    Colony Count 9,000 COLONIES/ML   Final    Culture INSIGNIFICANT GROWTH   Final    Report Status 06/22/2011 FINAL   Final   CULTURE, BLOOD (ROUTINE X 2)     Status:  Normal (Preliminary result)   Collection Time   06/21/11  1:06 AM      Component Value Range Status Comment   Specimen Description BLOOD RIGHT ARM   Final    Special Requests BOTTLES DRAWN AEROBIC AND ANAEROBIC 5 CC EA   Final    Culture  Setup Time [id number]   Final    Culture     Final    Value:        BLOOD CULTURE RECEIVED NO GROWTH TO DATE CULTURE WILL BE HELD FOR 5 DAYS BEFORE ISSUING A FINAL NEGATIVE REPORT   Report Status PENDING   Incomplete   CULTURE, BLOOD (ROUTINE X 2)     Status: Normal (Preliminary result)   Collection Time   06/21/11  1:15 AM      Component Value Range Status Comment   Specimen Description BLOOD RIGHT ANTECUBITAL   Final    Special Requests BOTTLES DRAWN AEROBIC AND ANAEROBIC 5 CC EA   Final    Culture  Setup Time [id number]   Final    Culture     Final    Value:        BLOOD CULTURE RECEIVED NO GROWTH TO DATE CULTURE WILL BE HELD FOR 5 DAYS BEFORE ISSUING A FINAL NEGATIVE REPORT   Report Status PENDING   Incomplete     Studies/Results: No results found.  Medications: Scheduled Meds:   . amLODipine  5 mg Oral Daily  . aspirin EC  81 mg Oral Daily  . budesonide-formoterol  2 puff Inhalation BID  . calcitRIOL  0.25 mcg Oral Daily  . citalopram  40 mg Oral Daily  . enoxaparin (LOVENOX) injection  60 mg Subcutaneous Q24H  . ferrous sulfate  325 mg Oral Q breakfast  . furosemide  40 mg Intravenous BID  . furosemide  40 mg Intravenous Once  . insulin aspart  0-15 Units Subcutaneous TID WC  . insulin aspart  0-5 Units Subcutaneous QHS  . insulin aspart protamine-insulin aspart  35 Units Subcutaneous BID WC  . ipratropium  0.5 mg Nebulization Q6H  . levalbuterol  0.63 mg Nebulization Q6H  . methylPREDNISolone (SOLU-MEDROL) injection  60 mg Intravenous Q6H  . moxifloxacin  400 mg Intravenous Q24H  . multivitamin with minerals  1 tablet Oral Daily  . sodium chloride  3 mL Intravenous Q12H  . vancomycin  1,500 mg Intravenous Q24H  . DISCONTD:  insulin aspart  0-5 Units Subcutaneous QHS  . DISCONTD: insulin aspart  0-9 Units Subcutaneous TID WC  . DISCONTD: insulin aspart protamine-insulin aspart  30 Units Subcutaneous BID WC   Continuous Infusions:  PRN Meds:.acetaminophen, acetaminophen, ALPRAZolam, levalbuterol  Assessment/Plan:  Principal Problem:  *Respiratory failure, acute-on-chronic Active Problems:  Chronic systolic congestive heart failure  CAD (coronary artery disease)  Healthcare-associated pneumonia  COPD with acute exacerbation  Systolic CHF, acute on chronic  Diabetes mellitus  Acute renal insufficiency  Respiratory failure, acute-on-chronic: Multifactorial, with a history of chronic systolic CHF, coronary artery disease, COPD in chronic respiratory failure . Not much improvement. Difficulty tolerating coughing spells. Will continue scheduled Xopenex and Atrovent nebs, O2 via [place] 5 L, IV Solu-Medrol, Symbicort and IV Avelox. d-dimer elevated,  ordered VQ scan however patient refused VQ scan due to dyspnea and unable to lie flat . Continue incentive spirometry. Consider pulmonary consult given slow to progress.  Active Problems:  CAD (coronary artery disease)  - Cardiac enzymes x3 negative,  - 2-D echo with EF of 30-35%, moderately to severely reduced systolic function (no significant difference from previous ECHO 3/13) . Continue aspirin, not on beta blocker outpatient likely secondary to chronic lung disease . Holding losartan secondary to increasing creatinine . Volume status -1.8L.  Healthcare-associated pneumonia:  - Continue on vancomycin and IV Avelox day #3. Afebrile non-toxic appearing. Blood cultures no growth.  COPD with acute exacerbation: see #1. Will continue IV solumedrol one more day. Air movement fair. If no improvement tomorrow will consider pulm consult.    Systolic CHF, acute on chronic: despite diuresis, weight 123.3kg from 120.0kg 2 days ago. Remains on 5l O2 [place]  - BNP 5844 from 13733 2 days  ago. Will continue IV lasix one more day. Creatinine 1.52  Today from 1.64 yesterday.  - Patient's cardiologist is Dr. Gala Romney, will consult if no significant improvement  Diabetes mellitus: A1c 7.5  - Uncontrolled likely scheduled to steroids, change to moderate sliding scale insulin and increase NovoLog 70/30, 35 units twice a day  Acute renal insufficiency  - Hold losartan, cautious IV diuresis . Chart review indicates close to baseline. Will monitor closely.      LOS: 3 days   [facility] M 06/23/2011, 9:10 AM

## 2011-06-23 NOTE — Progress Notes (Signed)
ANTIBIOTIC CONSULT NOTE - Follow Up  Pharmacy Consult for Vancomycin Indication: rule out pneumonia  Allergies  Allergen Reactions  . Pioglitazone     REACTION: edema  . Rosiglitazone Maleate Nausea And Vomiting    Patient Measurements: Height: 5\' 7"  (170.2 cm) Weight: 271 lb 9.7 oz (123.2 kg) IBW/kg (Calculated) : 61.6   Vital Signs: Temp: 97.7 F (36.5 C) (06/12 1444) Temp src: Oral (06/12 1444) BP: 142/84 mmHg (06/12 1444) Pulse Rate: 109  (06/12 1444)  Labs:  Alvira Philips 06/23/11 0538 06/21/11 0735 06/20/11 2350  WBC -- 13.4* 14.3*  HGB -- 10.9* 11.1*  PLT -- 197 204  LABCREA -- -- --  CREATININE 1.52* 1.64* 1.34*   Estimated Creatinine Clearance: 49.5 ml/min (by C-G formula based on Cr of 1.52).  Basename 06/23/11 1545  VANCOTROUGH 15.7  VANCOPEAK --  VANCORANDOM --  GENTTROUGH --  GENTPEAK --  GENTRANDOM --  TOBRATROUGH --  TOBRAPEAK --  TOBRARND --  AMIKACINPEAK --  AMIKACINTROU --  AMIKACIN --     Microbiology: Recent Results (from the past 720 hour(s))  URINE CULTURE     Status: Normal   Collection Time   06/21/11 12:12 AM      Component Value Range Status Comment   Specimen Description URINE, CLEAN CATCH   Final    Special Requests NONE   Final    Culture  Setup Time [id number]   Final    Colony Count 9,000 COLONIES/ML   Final    Culture INSIGNIFICANT GROWTH   Final    Report Status 06/22/2011 FINAL   Final   CULTURE, BLOOD (ROUTINE X 2)     Status: Normal (Preliminary result)   Collection Time   06/21/11  1:06 AM      Component Value Range Status Comment   Specimen Description BLOOD RIGHT ARM   Final    Special Requests BOTTLES DRAWN AEROBIC AND ANAEROBIC 5 CC EA   Final    Culture  Setup Time [id number]   Final    Culture     Final    Value:        BLOOD CULTURE RECEIVED NO GROWTH TO DATE CULTURE WILL BE HELD FOR 5 DAYS BEFORE ISSUING A FINAL NEGATIVE REPORT   Report Status PENDING   Incomplete   CULTURE, BLOOD (ROUTINE X 2)      Status: Normal (Preliminary result)   Collection Time   06/21/11  1:15 AM      Component Value Range Status Comment   Specimen Description BLOOD RIGHT ANTECUBITAL   Final    Special Requests BOTTLES DRAWN AEROBIC AND ANAEROBIC 5 CC EA   Final    Culture  Setup Time [id number]   Final    Culture     Final    Value:        BLOOD CULTURE RECEIVED NO GROWTH TO DATE CULTURE WILL BE HELD FOR 5 DAYS BEFORE ISSUING A FINAL NEGATIVE REPORT   Report Status PENDING   Incomplete     Medical History: Past Medical History  Diagnosis Date  . RENAL INSUFFICIENCY 10/15/2008  . Edema 06/18/2008  . PNEUMONIA 12/01/2007  . CARPAL TUNNEL SYNDROME, BILATERAL 10/30/2007  . HAND PAIN, LEFT 09/04/2007  . Morbid obesity   . ANXIETY 11/08/2006  . HYPERTENSION 11/08/2006  . DIABETES MELLITUS, TYPE II 11/08/2006  . HYPERCHOLESTEROLEMIA 07/24/2009  . ANEMIA 07/24/2009  . COPD 11/08/2006    -HFA 50% p coaching 10/15/2009  . RESPIRATORY FAILURE, CHRONIC 10/15/2009  .  BACK PAIN, LUMBAR 03/07/2009  . CHEST PAIN 07/24/2009  . Restless leg syndrome   . Systolic CHF, acute 03/24/2011  . Ischemic cardiomyopathy 03/26/2011    Medications:  Anti-infectives     Start     Dose/Rate Route Frequency Ordered Stop   06/22/11 0200   moxifloxacin (AVELOX) IVPB 400 mg        400 mg 250 mL/hr over 60 Minutes Intravenous Every 24 hours 06/21/11 1811     06/21/11 1600   vancomycin (VANCOCIN) 1,500 mg in sodium chloride 0.9 % 500 mL IVPB        1,500 mg 250 mL/hr over 120 Minutes Intravenous Every 24 hours 06/21/11 1542     06/21/11 0045   moxifloxacin (AVELOX) IVPB 400 mg        400 mg 250 mL/hr over 60 Minutes Intravenous  Once 06/21/11 0044 06/21/11 0319         Assessment:  66 yo F admit from home with acute on chronic respiratory failure.  Day #3 Vancomycin/Avelox  Renal function slightly improved  VT at goal  Goal of Therapy:  Vancomycin trough level 15-20 mcg/ml  Plan:   Continue Vancomycin 1500mg  IV  q24h.  Note that trough was checked prior to actual steady state of vanc due to fact that patient is obese and with elevated SCr just to make sure they are clearing the vanc some   Hessie Knows, PharmD, BCPS Pager [phone] 06/23/2011 5:22 PM

## 2011-06-24 ENCOUNTER — Encounter: Payer: Self-pay | Admitting: Endocrinology

## 2011-06-24 DIAGNOSIS — D696 Thrombocytopenia, unspecified: Secondary | ICD-10-CM

## 2011-06-24 DIAGNOSIS — J9620 Acute and chronic respiratory failure, unspecified whether with hypoxia or hypercapnia: Secondary | ICD-10-CM

## 2011-06-24 DIAGNOSIS — J441 Chronic obstructive pulmonary disease with (acute) exacerbation: Secondary | ICD-10-CM

## 2011-06-24 DIAGNOSIS — I5023 Acute on chronic systolic (congestive) heart failure: Secondary | ICD-10-CM

## 2011-06-24 LAB — BASIC METABOLIC PANEL
CO2: 32 mEq/L (ref 19–32)
Calcium: 8.3 mg/dL — ABNORMAL LOW (ref 8.4–10.5)
Creatinine, Ser: 1.53 mg/dL — ABNORMAL HIGH (ref 0.50–1.10)
GFR calc non Af Amer: 34 mL/min — ABNORMAL LOW (ref >90)
Glucose, Bld: 257 mg/dL — ABNORMAL HIGH (ref 70–99)
Sodium: 137 mEq/L (ref 135–145)

## 2011-06-24 LAB — CBC
Hemoglobin: 10.3 g/dL — ABNORMAL LOW (ref 12.0–15.0)
MCH: 28.3 pg (ref 26.0–34.0)
MCHC: 31.2 g/dL (ref 30.0–36.0)
MCV: 90.7 fL (ref 78.0–100.0)
Platelets: 245 10*3/uL (ref 150–400)

## 2011-06-24 LAB — GLUCOSE, CAPILLARY
Glucose-Capillary: 236 mg/dL — ABNORMAL HIGH (ref 70–99)
Glucose-Capillary: 287 mg/dL — ABNORMAL HIGH (ref 70–99)
Glucose-Capillary: 175 mg/dL — ABNORMAL HIGH (ref 70–99)

## 2011-06-24 MED ORDER — ALPRAZOLAM 0.5 MG PO TABS
0.5000 mg | ORAL_TABLET | Freq: Three times a day (TID) | ORAL | Status: DC | PRN
  Administered 2011-06-25 – 2011-07-01 (×9): 0.5 mg via ORAL
  Filled 2011-06-24 (×9): qty 1

## 2011-06-24 MED ORDER — INSULIN ASPART PROT & ASPART (70-30 MIX) 100 UNIT/ML ~~LOC~~ SUSP
45.0000 [IU] | Freq: Two times a day (BID) | SUBCUTANEOUS | Status: DC
  Administered 2011-06-24 – 2011-06-26 (×5): 45 [IU] via SUBCUTANEOUS
  Filled 2011-06-24: qty 3

## 2011-06-24 NOTE — Progress Notes (Signed)
Subjective: Sitting on side of bed.   Objective: Vital signs Filed Vitals:   06/24/11 0224 06/24/11 0606 06/24/11 0920 06/24/11 0952  BP:  123/66  146/75  Pulse:  111    Temp:  98 F (36.7 C)    TempSrc:  Oral    Resp:  21    Height:      Weight:  122.6 kg (270 lb 4.5 oz)    SpO2: 100% 97% 94%    Weight change: -0.6 kg (-1 lb 5.2 oz) Last BM Date: 06/23/11  Intake/Output from previous day: 06/12 0701 - 06/13 0700 In: 1230 [P.O.:480; IV Piggyback:750] Out: 5475 [Urine:5475] Total I/O In: 480 [P.O.:480] Out: 1075 [Urine:1075]   Physical Exam: General: Alert, awake, oriented x3, in no acute distress. HEENT: No bruits, no goiter. Heart: Regular rhythm, tachycardic, without murmurs, rubs, gallops. Lungs: Expiratory wheezing present with bibasilar crackles, no rhonchi Abdomen: Soft, nontender, nondistended, positive bowel sounds. Extremities: No clubbing cyanosis or edema with positive pedal pulses. Neuro: Grossly intact, nonfocal.    Lab Results: Basic Metabolic Panel:  Basename 06/24/11 0505 06/23/11 0538  NA 137 --  K 4.5 --  CL 97 --  CO2 32 --  GLUCOSE 257* --  BUN 62* --  CREATININE 1.53* 1.52*  CALCIUM 8.3* --   CBC:  Basename 06/24/11 0505  WBC 13.5*  HGB 10.3*  HCT 33.0*  MCV 90.7  PLT 245   BNP:  Basename 06/23/11 0538  PROBNP 5844.0*   D-Dimer: No results found for this basename: DDIMER:2 in the last 72 hours CBG:  Basename 06/24/11 1124 06/24/11 0726 06/23/11 2202 06/23/11 1650 06/23/11 1152 06/23/11 0730  GLUCAP 297* 236* 213* 250* 375* 372*    Recent Results (from the past 240 hour(s))  URINE CULTURE     Status: Normal   Collection Time   06/21/11 12:12 AM      Component Value Range Status Comment   Specimen Description URINE, CLEAN CATCH   Final    Special Requests NONE   Final    Culture  Setup Time [id number]   Final    Colony Count 9,000 COLONIES/ML   Final    Culture INSIGNIFICANT GROWTH   Final    Report Status  06/22/2011 FINAL   Final   CULTURE, BLOOD (ROUTINE X 2)     Status: Normal (Preliminary result)   Collection Time   06/21/11  1:06 AM      Component Value Range Status Comment   Specimen Description BLOOD RIGHT ARM   Final    Special Requests BOTTLES DRAWN AEROBIC AND ANAEROBIC 5 CC EA   Final    Culture  Setup Time [id number]   Final    Culture     Final    Value:        BLOOD CULTURE RECEIVED NO GROWTH TO DATE CULTURE WILL BE HELD FOR 5 DAYS BEFORE ISSUING A FINAL NEGATIVE REPORT   Report Status PENDING   Incomplete   CULTURE, BLOOD (ROUTINE X 2)     Status: Normal (Preliminary result)   Collection Time   06/21/11  1:15 AM      Component Value Range Status Comment   Specimen Description BLOOD RIGHT ANTECUBITAL   Final    Special Requests BOTTLES DRAWN AEROBIC AND ANAEROBIC 5 CC EA   Final    Culture  Setup Time [id number]   Final    Culture     Final    Value:  BLOOD CULTURE RECEIVED NO GROWTH TO DATE CULTURE WILL BE HELD FOR 5 DAYS BEFORE ISSUING A FINAL NEGATIVE REPORT   Report Status PENDING   Incomplete   WOUND CULTURE     Status: Normal (Preliminary result)   Collection Time   2011/07/19  7:54 AM      Component Value Range Status Comment   Specimen Description WOUND BACK   Final    Special Requests Immunocompromised   Final    Gram Stain     Final    Value: NO WBC SEEN     FEW SQUAMOUS EPITHELIAL CELLS PRESENT     RARE GRAM POSITIVE COCCI IN PAIRS     FEW GRAM NEGATIVE COCCOBACILLI   Culture NO GROWTH 1 DAY   Final    Report Status PENDING   Incomplete     Studies/Results: Dg Chest 2 View 2011/07/19    IMPRESSION:  No active disease.  No significant change.  Stable bilateral upper lobe nodular scarring.    Medications: Scheduled Meds:   . amLODipine  5 mg Oral Daily  . aspirin EC  81 mg Oral Daily  . budesonide-formoterol  2 puff Inhalation BID  . calcitRIOL  0.25 mcg Oral Daily  . citalopram  40 mg Oral Daily  . enoxaparin (LOVENOX) injection  60 mg  Subcutaneous Q24H  . ferrous sulfate  325 mg Oral Q breakfast  . furosemide  40 mg Intravenous Once  . furosemide  40 mg Intravenous BID  . insulin aspart  0-15 Units Subcutaneous TID WC  . insulin aspart  0-5 Units Subcutaneous QHS  . insulin aspart protamine-insulin aspart  45 Units Subcutaneous BID WC  . ipratropium  0.5 mg Nebulization Q6H  . levalbuterol  0.63 mg Nebulization Q6H  . methylPREDNISolone (SOLU-MEDROL) injection  60 mg Intravenous Q12H  . moxifloxacin  400 mg Intravenous Q24H  . multivitamin with minerals  1 tablet Oral Daily  . sodium chloride  3 mL Intravenous Q12H  . vancomycin  1,500 mg Intravenous Q24H  . DISCONTD: furosemide  40 mg Intravenous Once  . DISCONTD: furosemide  40 mg Intravenous BID  . DISCONTD: furosemide  40 mg Intravenous BID  . DISCONTD: furosemide  40 mg Intravenous BID  . DISCONTD: insulin aspart protamine-insulin aspart  35 Units Subcutaneous BID WC  . DISCONTD: LORazepam  1 mg Intravenous Once  . DISCONTD: methylPREDNISolone (SOLU-MEDROL) injection  60 mg Intravenous Q6H   Continuous Infusions:  PRN Meds:.acetaminophen, acetaminophen, ALPRAZolam, levalbuterol, sodium chloride  Assessment/Plan:  Respiratory failure, acute-on-chronic:  Multifactorial, with a history of chronic systolic CHF, coronary artery disease, COPD .  Mild clinical improvement but expiratory wheezing still present. Will continue scheduled Xopenex and Atrovent nebs, O2 via [place] 5 L, IV Solu-Medrol, Symbicort and IV Avelox.  D-dimer elevated, ordered VQ scan however patient refused VQ scan due to dyspnea and unable to lie flat .  Continue incentive spirometry. Consider pulmonary consult given slow to progress.   CAD (coronary artery disease)  - Cardiac enzymes x3 negative,  - 2-D echo with EF of 30-35%, moderately to severely reduced systolic function - no significant difference from previous ECHO 3/13) .  - Continue aspirin, not on beta blocker outpatient likely  secondary to chronic lung disease .  - Holding losartan secondary to increasing creatinine . Volume status -1.8L.   Healthcare-associated pneumonia:  - Continue on vancomycin and IV Avelox day #3.  - Afebrile non-toxic appearing. Blood cultures no growth.   COPD with acute exacerbation:  -  see #1. Will continue IV solumedrol one more day. Air movement fair.   Systolic CHF, acute on chronic: despite  - diuresis, weight 123.3kg from 120.0kg 2 days ago. Remains on 5l O2 [place]  - BNP 5844 from 13733 2 days ago. Will continue IV lasix one more day. Creatinine 1.52 Today from 1.64 yesterday.  - Patient's cardiologist is Dr. Gala Romney, will consult if no significant improvement   Diabetes mellitus: A1c 7.5  - Uncontrolled likely scheduled to steroids - change to moderate sliding scale insulin and increase NovoLog 70/30, 35 units twice a day   Acute renal insufficiency  - Hold losartan, cautious IV diuresis .  - Chart review indicates close to baseline. Will monitor closely.     LOS: 4 days   [facility] M 06/24/2011, 12:33 PM  Debbora Presto [facility] [phone]

## 2011-06-24 NOTE — Progress Notes (Signed)
PT Cancellation Note  Evaluation cancelled today due to patient's refusal to participate.  Pt reports she just started breathing better and declined therapy at this time.  Agreeable to attempt evaluation tomorrow.  Deepti Gunawan,KATHrine E 06/24/2011, 1:04 PM Pager: [phone]

## 2011-06-24 NOTE — Progress Notes (Signed)
Patient has been engaged by [facility] Coordination RN with great difficulty.  Prior to admission 3 attempts were made to arrange home visits.  Serves can be continued if patient actively participates. Will discuss further with patient at bedside. For any additional questions or new referrals please contact Anibal Henderson BSN RN [facility] Liaison at [phone].

## 2011-06-25 ENCOUNTER — Encounter: Payer: Medicare Other | Admitting: Endocrinology

## 2011-06-25 DIAGNOSIS — D696 Thrombocytopenia, unspecified: Secondary | ICD-10-CM

## 2011-06-25 DIAGNOSIS — J9620 Acute and chronic respiratory failure, unspecified whether with hypoxia or hypercapnia: Secondary | ICD-10-CM

## 2011-06-25 DIAGNOSIS — I5023 Acute on chronic systolic (congestive) heart failure: Secondary | ICD-10-CM

## 2011-06-25 DIAGNOSIS — J441 Chronic obstructive pulmonary disease with (acute) exacerbation: Secondary | ICD-10-CM

## 2011-06-25 LAB — BASIC METABOLIC PANEL
BUN: 54 mg/dL — ABNORMAL HIGH (ref 6–23)
Chloride: 98 mEq/L (ref 96–112)
Creatinine, Ser: 1.36 mg/dL — ABNORMAL HIGH (ref 0.50–1.10)
GFR calc Af Amer: 46 mL/min — ABNORMAL LOW (ref >90)
GFR calc non Af Amer: 40 mL/min — ABNORMAL LOW (ref >90)
Potassium: 4.7 mEq/L (ref 3.5–5.1)

## 2011-06-25 LAB — CBC
MCHC: 31.5 g/dL (ref 30.0–36.0)
MCV: 90.4 fL (ref 78.0–100.0)
Platelets: 241 10*3/uL (ref 150–400)
RDW: 14.1 % (ref 11.5–15.5)
WBC: 11.4 10*3/uL — ABNORMAL HIGH (ref 4.0–10.5)

## 2011-06-25 LAB — GLUCOSE, CAPILLARY: Glucose-Capillary: 297 mg/dL — ABNORMAL HIGH (ref 70–99)

## 2011-06-25 MED ORDER — MOXIFLOXACIN HCL 400 MG PO TABS
400.0000 mg | ORAL_TABLET | Freq: Every day | ORAL | Status: DC
  Administered 2011-06-25 – 2011-06-26 (×2): 400 mg via ORAL
  Filled 2011-06-25 (×3): qty 1

## 2011-06-25 MED ORDER — PREDNISONE 50 MG PO TABS
50.0000 mg | ORAL_TABLET | Freq: Every day | ORAL | Status: DC
  Administered 2011-06-26 – 2011-06-27 (×2): 50 mg via ORAL
  Filled 2011-06-25 (×3): qty 1

## 2011-06-25 MED ORDER — FUROSEMIDE 40 MG PO TABS
40.0000 mg | ORAL_TABLET | Freq: Two times a day (BID) | ORAL | Status: DC
  Administered 2011-06-26 – 2011-06-27 (×3): 40 mg via ORAL
  Filled 2011-06-25 (×5): qty 1

## 2011-06-25 MED ORDER — FUROSEMIDE 40 MG PO TABS
40.0000 mg | ORAL_TABLET | Freq: Two times a day (BID) | ORAL | Status: DC
  Filled 2011-06-25 (×2): qty 1

## 2011-06-25 NOTE — Evaluation (Signed)
Physical Therapy Evaluation Patient Details Name: [name] MRN: [id number] DOB: 06/05/1945 Today's Date: 06/25/2011 Time: 1308-6578 PT Time Calculation (min): 19 min  PT Assessment / Plan / Recommendation Clinical Impression  Pt admitted with acute on chronic respiratory failure.  Pt with chronic O2 use from COPD and with acute diagnosis of pneumonia.  Pt would benefit from acute PT services in order to improve activity tolerance and independence with ambulation to prepare pt for d/c home with daughter.    PT Assessment  Patient needs continued PT services    Follow Up Recommendations  Home health PT    Barriers to Discharge        lEquipment Recommendations  None recommended by PT    Recommendations for Other Services     Frequency Min 3X/week    Precautions / Restrictions Precautions Precautions: Fall   Pertinent Vitals/Pain No pain      Mobility  Bed Mobility Bed Mobility: Supine to Sit Supine to Sit: 6: Modified independent (Device/Increase time) Transfers Transfers: Sit to Stand;Stand to Sit Sit to Stand: 4: Min guard;From bed Stand to Sit: 4: Min guard;To bed Details for Transfer Assistance: verbal cue to control descent Ambulation/Gait Ambulation/Gait Assistance: 4: Min assist Ambulation Distance (Feet): 10 Feet (x2) Assistive device: 2 person hand held assist;1 person hand held assist Ambulation/Gait Assistance Details: pt declined RW so occasional 2 person HHA however able to ambulate with 1 HHA, maintained 5L O2, pt ambulated around bed and sat for rest break due to dyspnea prior to ambulation back to other side of bed, SaO2 remained above 91%, one moment of unsteadiness with which pt stopped to get bearings Gait Pattern: Step-through pattern;Decreased stride length;Wide base of support    Exercises     PT Diagnosis: Difficulty walking  PT Problem List: Decreased activity tolerance;Decreased mobility;Cardiopulmonary status limiting activity;Decreased  knowledge of use of DME PT Treatment Interventions: DME instruction;Gait training;Functional mobility training;Therapeutic activities;Therapeutic exercise;Patient/family education;Balance training   PT Goals Acute Rehab PT Goals PT Goal Formulation: With patient Time For Goal Achievement: 07/09/11 Potential to Achieve Goals: Good Pt will go Sit to Stand: with modified independence PT Goal: Sit to Stand - Progress: Goal set today Pt will go Stand to Sit: with modified independence PT Goal: Stand to Sit - Progress: Goal set today Pt will Ambulate: 51 - 150 feet;with modified independence;with least restrictive assistive device;Other (comment) (SaO2 above 92%) PT Goal: Ambulate - Progress: Goal set today  Visit Information  Last PT Received On: 06/25/11 Assistance Needed: +1    Subjective Data  Subjective: "did you see that weather last night?"   Prior Functioning  Home Living Lives With: Daughter Type of Home: House Home Access: Stairs to enter Secretary/administrator of Steps: 2-3 Home Layout: One level Home Adaptive Equipment: Walker - rolling Additional Comments: 4L O2 at home Prior Function Level of Independence: Independent Communication Communication: No difficulties    Cognition  Overall Cognitive Status: Appears within functional limits for tasks assessed/performed Arousal/Alertness: Awake/alert Orientation Level: Appears intact for tasks assessed Behavior During Session: [facility] for tasks performed    Extremity/Trunk Assessment Right Lower Extremity Assessment RLE ROM/Strength/Tone: [facility] for tasks assessed Left Lower Extremity Assessment LLE ROM/Strength/Tone: [facility] for tasks assessed   Balance    End of Session PT - End of Session Equipment Utilized During Treatment: Oxygen Activity Tolerance: Patient limited by fatigue Patient left: in bed;with call bell/phone within reach;Other (comment) (sitting EOB)   Fenix Rorke,KATHrine E 06/25/2011, 12:50 PM Pager: [phone]

## 2011-06-25 NOTE — Progress Notes (Signed)
[facility] PROGRESS NOTE  [name] ZOX:[id number] DOB: Nov 26, 1945 DOA: 06/20/2011 PCP: Romero Belling, MD  Brief narrative: 66 yo female hx copd on home o2 admitted 6/10 with copd exacerbation and pna and mild CHF  Consultants:  none  Procedures:  none  Antibiotics:  Avelox and Vanc 6/10  HPI/Subjective: Sitting on side of bed. Reports feeling only a little better. Coughing thick sputum.   Objective: Filed Vitals:   06/25/11 0159 06/25/11 0513 06/25/11 0918 06/25/11 0957  BP:  128/61  158/72  Pulse:  93    Temp:  98.1 F (36.7 C)    TempSrc:  Oral    Resp:  20    Height:      Weight:  121.6 kg (268 lb 1.3 oz)    SpO2: 95% 100% 93%     Intake/Output Summary (Last 24 hours) at 06/25/11 1155 Last data filed at 06/25/11 0930  Gross per 24 hour  Intake   1735 ml  Output   5250 ml  Net  -3515 ml    Exam:   General:  Awake alert oriented x3. Well nourished. NAD   Cardiovascular: mostly mildly tachycardic, regular. No murmur gallop, rub.  Trace LEE  Respiratory: Moderate increased work of breathing with conversation, frequent coughing during exam. Breath sounds very distant with some expiratory wheeze. No rhonchi, no crackles  Abdomen: obese, soft non-tender, non-distended. +BS  Data Reviewed: Basic Metabolic Panel:  Lab 06/25/11 8119 06/24/11 0505 06/23/11 0538 06/21/11 0735 06/20/11 2350  NA 137 137 -- 134* 134*  K 4.7 4.5 -- 4.6 4.6  CL 98 97 -- 93* 94*  CO2 35* 32 -- 30 29  GLUCOSE 264* 257* -- 167* 193*  BUN 54* 62* -- 33* 28*  CREATININE 1.36* 1.53* 1.52* 1.64* 1.34*  CALCIUM 8.6 8.3* -- 8.9 8.8  MG -- -- -- -- --  PHOS -- -- -- -- --   Liver Function Tests:  Lab 06/21/11 0735  AST 20  ALT 15  ALKPHOS 47  BILITOT 0.3  PROT 7.6  ALBUMIN 3.3*   CBC:  Lab 06/25/11 0425 06/24/11 0505 06/21/11 0735 06/20/11 2350  WBC 11.4* 13.5* 13.4* 14.3*  NEUTROABS -- -- 10.8* 12.8*  HGB 10.4* 10.3* 10.9* 11.1*  HCT 33.0* 33.0* 33.2* 34.5*    MCV 90.4 90.7 88.8 88.9  PLT 241 245 197 204   Cardiac Enzymes: CBG:  Lab 06/25/11 0719 06/24/11 2206 06/24/11 1629 06/24/11 1124 06/24/11 0726  GLUCAP 168* 175* 287* 297* 236*     Studies: Dg Chest 2 View  06/23/2011  *RADIOLOGY REPORT*  Clinical Data: Shortness of breath, chest pain  CHEST - 2 VIEW  Comparison: 06/20/2011 and 03/23/2011  Findings: Cardiomediastinal silhouette is stable.  No acute infiltrate or pleural effusion.  No pulmonary edema.  Stable bilateral upper lobe nodular scarring.  Bony thorax is stable.  IMPRESSION: No active disease.  No significant change.  Stable bilateral upper lobe nodular scarring.  Original Report Authenticated By: Natasha Mead, M.D.    Scheduled Meds:   . amLODipine  5 mg Oral Daily  . aspirin EC  81 mg Oral Daily  . budesonide-formoterol  2 puff Inhalation BID  . calcitRIOL  0.25 mcg Oral Daily  . citalopram  40 mg Oral Daily  . enoxaparin (LOVENOX) injection  60 mg Subcutaneous Q24H  . ferrous sulfate  325 mg Oral Q breakfast  . furosemide  40 mg Intravenous BID  . insulin aspart  0-15 Units Subcutaneous TID WC  .  insulin aspart  0-5 Units Subcutaneous QHS  . insulin aspart protamine-insulin aspart  45 Units Subcutaneous BID WC  . ipratropium  0.5 mg Nebulization Q6H  . levalbuterol  0.63 mg Nebulization Q6H  . methylPREDNISolone (SOLU-MEDROL) injection  60 mg Intravenous Q12H  . moxifloxacin  400 mg Intravenous Q24H  . multivitamin with minerals  1 tablet Oral Daily  . sodium chloride  3 mL Intravenous Q12H  . vancomycin  1,500 mg Intravenous Q24H   Continuous Infusions:    Assessment/Place  Respiratory failure, acute-on-chronic:  Multifactorial, with a history of chronic systolic CHF, coronary artery disease, COPD .  Mild clinical improvement but expiratory wheezing still present.  Will continue scheduled Xopenex and Atrovent nebs, O2 via [place] 5 L, IV Solu-Medrol, Symbicort and  Avelox will change to po.  D-dimer elevated,  ordered VQ scan however patient refused VQ scan due to dyspnea and unable to lie flat .  Continue incentive spirometry. Will obtain pulmonary consult given slow to progress.  CAD (coronary artery disease)  - Cardiac enzymes x3 negative, No chest pain - 2-D echo with EF of 30-35%, moderately to severely reduced systolic function  - no significant difference from previous ECHO 3/13) .  - Continue aspirin, not on beta blocker outpatient likely secondary to chronic lung disease .  - Holding losartan secondary to creatinine . Volume status -8.3L.  Healthcare-associated pneumonia:  - Transition to PO Avelox - Afebrile non-toxic appearing. Blood cultures no growth.  COPD with acute exacerbation:  - see #1. Will taper down to oral prednisone. Systolic CHF, acute on chronic: despite  - diuresis, weight 121kg from 123.0kg 2 days ago. Remains on 5l O2 [place]  - Will continue IV lasix one more day. Creatinine 1.36 from 1.52  yesterday.  - Patient's cardiologist is Dr. Gala Romney, will consult if no significant improvement  Diabetes mellitus, uncontrolled and with complications, neuropathy and gastroparesis: A1c 7.5  - Uncontrolled likely scheduled to steroids CBG range 175-273. - change to moderate sliding scale insulin and increase NovoLog 70/30, 35 units twice a day  Acute renal insufficiency  - Hold losartan, cautious IV diuresis .  - Chart review indicates close to baseline. Will monitor closely.    Gwenyth Bender, MD  [facility] Pager [phone] Debbora Presto [phone] If 7PM-7AM, please contact night-coverage [email or web address] Password [email or web address] 06/25/2011, 11:55 AM   LOS: 5 days

## 2011-06-26 ENCOUNTER — Inpatient Hospital Stay (HOSPITAL_COMMUNITY): Payer: Medicare Other

## 2011-06-26 DIAGNOSIS — I5023 Acute on chronic systolic (congestive) heart failure: Secondary | ICD-10-CM

## 2011-06-26 DIAGNOSIS — J9620 Acute and chronic respiratory failure, unspecified whether with hypoxia or hypercapnia: Secondary | ICD-10-CM

## 2011-06-26 DIAGNOSIS — D696 Thrombocytopenia, unspecified: Secondary | ICD-10-CM

## 2011-06-26 DIAGNOSIS — J441 Chronic obstructive pulmonary disease with (acute) exacerbation: Secondary | ICD-10-CM

## 2011-06-26 LAB — BASIC METABOLIC PANEL
BUN: 49 mg/dL — ABNORMAL HIGH (ref 6–23)
CO2: 38 mEq/L — ABNORMAL HIGH (ref 19–32)
Calcium: 9.1 mg/dL (ref 8.4–10.5)
Creatinine, Ser: 1.29 mg/dL — ABNORMAL HIGH (ref 0.50–1.10)
GFR calc non Af Amer: 42 mL/min — ABNORMAL LOW (ref >90)
Glucose, Bld: 192 mg/dL — ABNORMAL HIGH (ref 70–99)
Sodium: 137 mEq/L (ref 135–145)

## 2011-06-26 LAB — CBC
MCH: 28.3 pg (ref 26.0–34.0)
MCHC: 31.3 g/dL (ref 30.0–36.0)
MCV: 90.4 fL (ref 78.0–100.0)
Platelets: 258 10*3/uL (ref 150–400)
RBC: 3.85 MIL/uL — ABNORMAL LOW (ref 3.87–5.11)
RDW: 14 % (ref 11.5–15.5)

## 2011-06-26 LAB — GLUCOSE, CAPILLARY
Glucose-Capillary: 214 mg/dL — ABNORMAL HIGH (ref 70–99)
Glucose-Capillary: 370 mg/dL — ABNORMAL HIGH (ref 70–99)

## 2011-06-26 LAB — WOUND CULTURE

## 2011-06-26 MED ORDER — INSULIN ASPART PROT & ASPART (70-30 MIX) 100 UNIT/ML ~~LOC~~ SUSP
50.0000 [IU] | Freq: Two times a day (BID) | SUBCUTANEOUS | Status: DC
  Filled 2011-06-26: qty 3

## 2011-06-26 MED ORDER — INSULIN ASPART PROT & ASPART (70-30 MIX) 100 UNIT/ML ~~LOC~~ SUSP
50.0000 [IU] | Freq: Two times a day (BID) | SUBCUTANEOUS | Status: DC
  Administered 2011-06-26 – 2011-07-02 (×12): 50 [IU] via SUBCUTANEOUS
  Filled 2011-06-26: qty 3

## 2011-06-26 NOTE — Progress Notes (Signed)
Patient ID: FLORIENE JESCHKE, female   DOB: Aug 12, 1945, 66 y.o.   MRN: [id number]  [facility] PROGRESS NOTE  RASEEL JANS EXB:[id number] DOB: [birth year] DOA: 06/20/2011 PCP: Romero Belling, MD  Brief narrative:  66 yo female hx copd on home o2 admitted 6/10 with copd exacerbation and pna and mild CHF   Consultants:  None  Procedures:  None  Antibiotics:  Avelox and Vanc 6/10 --> 6/14 Avelox PO 6/14 -->  HPI/Subjective: Pt reports feeling better and is less short of breath. Denies any chest pain, no abdominal or urinary concerns. Reports sleeping well last night.   Objective: Filed Vitals:   06/26/11 0555 06/26/11 0939 06/26/11 1426 06/26/11 1438  BP: 156/74  154/83   Pulse: 91     Temp: 98.3 F (36.8 C)  98.1 F (36.7 C)   TempSrc: Oral  Oral   Resp: 18  18   Height:      Weight:      SpO2: 97% 94% 94% 94%    Intake/Output Summary (Last 24 hours) at 06/26/11 1658 Last data filed at 06/26/11 1312  Gross per 24 hour  Intake    723 ml  Output   1900 ml  Net  -1177 ml    Exam:   General:  Pt is alert, follows commands appropriately, not in acute distress  Cardiovascular: Regular rate and rhythm, S1/S2, no murmurs, no rubs, no gallops  Respiratory: Clear to auscultation bilaterally, no wheezing, no crackles, no rhonchi  Abdomen: Soft, non tender, non distended, bowel sounds present, no guarding  Extremities: Trace bilateral pitting edema, pulses DP and PT palpable bilaterally  Neuro: Grossly nonfocal  Data Reviewed: Basic Metabolic Panel:  Lab 06/26/11 1027 06/25/11 0425 06/24/11 0505 06/23/11 0538 06/21/11 0735 06/20/11 2350  NA 137 137 137 -- 134* 134*  K 5.0 4.7 4.5 -- 4.6 4.6  CL 95* 98 97 -- 93* 94*  CO2 38* 35* 32 -- 30 29  GLUCOSE 192* 264* 257* -- 167* 193*  BUN 49* 54* 62* -- 33* 28*  CREATININE 1.29* 1.36* 1.53* 1.52* 1.64* --  CALCIUM 9.1 8.6 8.3* -- 8.9 8.8   Liver Function Tests:  Lab 06/21/11 0735  AST 20  ALT 15  ALKPHOS 47    BILITOT 0.3  PROT 7.6  ALBUMIN 3.3*   CBC:  Lab 06/26/11 0526 06/25/11 0425 06/24/11 0505 06/21/11 0735 06/20/11 2350  WBC 12.3* 11.4* 13.5* 13.4* 14.3*  HGB 10.9* 10.4* 10.3* 10.9* 11.1*  HCT 34.8* 33.0* 33.0* 33.2* 34.5*  MCV 90.4 90.4 90.7 88.8 88.9  PLT 258 241 245 197 204   Cardiac Enzymes:  Lab 06/21/11 0735 06/21/11 0212 06/20/11 2350  CKTOTAL 159 121 98  CKMB 4.7* 3.6 3.0  TROPONINI <0.30 <0.30 <0.30   CBG:  Lab 06/26/11 1158 06/26/11 0740 06/25/11 2206 06/25/11 1706 06/25/11 1159  GLUCAP 214* 141* 125* 297* 273*    Recent Results (from the past 240 hour(s))  URINE CULTURE     Status: Normal   Collection Time   06/21/11 12:12 AM      Component Value Range Status Comment   Specimen Description URINE, CLEAN CATCH   Final    Special Requests NONE   Final    Culture  Setup Time [id number]   Final    Colony Count 9,000 COLONIES/ML   Final    Culture INSIGNIFICANT GROWTH   Final    Report Status 06/22/2011 FINAL   Final   CULTURE, BLOOD (ROUTINE X 2)  Status: Normal (Preliminary result)   Collection Time   06/21/11  1:06 AM      Component Value Range Status Comment   Specimen Description BLOOD RIGHT ARM   Final    Special Requests BOTTLES DRAWN AEROBIC AND ANAEROBIC 5 CC EA   Final    Culture  Setup Time [id number]   Final    Culture     Final    Value:        BLOOD CULTURE RECEIVED NO GROWTH TO DATE CULTURE WILL BE HELD FOR 5 DAYS BEFORE ISSUING A FINAL NEGATIVE REPORT   Report Status PENDING   Incomplete   CULTURE, BLOOD (ROUTINE X 2)     Status: Normal (Preliminary result)   Collection Time   06/21/11  1:15 AM      Component Value Range Status Comment   Specimen Description BLOOD RIGHT ANTECUBITAL   Final    Special Requests BOTTLES DRAWN AEROBIC AND ANAEROBIC 5 CC EA   Final    Culture  Setup Time [id number]   Final    Culture     Final    Value:        BLOOD CULTURE RECEIVED NO GROWTH TO DATE CULTURE WILL BE HELD FOR 5 DAYS BEFORE ISSUING A FINAL  NEGATIVE REPORT   Report Status PENDING   Incomplete   WOUND CULTURE     Status: Normal   Collection Time   06/23/11  7:54 AM      Component Value Range Status Comment   Specimen Description WOUND BACK   Final    Special Requests Immunocompromised   Final    Gram Stain     Final    Value: NO WBC SEEN     FEW SQUAMOUS EPITHELIAL CELLS PRESENT     RARE GRAM POSITIVE COCCI IN PAIRS     FEW GRAM NEGATIVE COCCOBACILLI   Culture     Final    Value: MULTIPLE ORGANISMS PRESENT, NONE PREDOMINANT     Note: NO STAPHYLOCOCCUS AUREUS ISOLATED NO GROUP A STREP (S.PYOGENES) ISOLATED   Report Status 06/26/2011 FINAL   Final      Studies: No results found.  Scheduled Meds:   . amLODipine  5 mg Oral Daily  . aspirin EC  81 mg Oral Daily  . budesonide-formoterol  2 puff Inhalation BID  . calcitRIOL  0.25 mcg Oral Daily  . citalopram  40 mg Oral Daily  . enoxaparin   60 mg Subcutaneous Q24H  . ferrous sulfate  325 mg Oral Q breakfast  . furosemide  40 mg Oral BID  . insulin aspart  0-15 Units Subcutaneous TID WC  . insulin aspart  0-5 Units Subcutaneous QHS  . insulin aspart   45 Units Subcutaneous BID WC  . ipratropium  0.5 mg Nebulization Q6H  . levalbuterol  0.63 mg Nebulization Q6H  . moxifloxacin  400 mg Oral Q2000  . multivitamin   1 tablet Oral Daily  . predniSONE  50 mg Oral Q breakfast   Continuous Infusions:    Assessment/Plan:  Respiratory failure, acute-on-chronic:  - Multifactorial, with a history of chronic systolic CHF, coronary artery disease, COPD .  - Pt is clinically improving - Will continue scheduled Xopenex and Atrovent nebs, O2 via [place] 5 L, Prednisone, Symbicort and Avelox - D-dimer elevated, ordered VQ scan however patient refused VQ scan due to dyspnea and unable to lie flat .  - Continue incentive spirometry. Will obtain pulmonary consult given slow to progress.  CAD (coronary artery disease)  - Cardiac enzymes x3 negative, No chest pain  - 2-D echo with EF  of 30-35%, moderately to severely reduced systolic function  - no significant difference from previous ECHO 3/13 - Continue aspirin, not on beta blocker outpatient likely secondary to chronic lung disease .  - Holding losartan secondary to creatinine . Volume status -8 L.   Healthcare-associated pneumonia:  - Continue Avelox  - Afebrile non-toxic appearing. Blood cultures no growth.   COPD with acute exacerbation:  - see #1. Will taper down to oral prednisone.   Systolic CHF, acute on chronic: despite  - diuresis, weight 120kg from 123.0kg 2 days ago. Remains on 5l O2 [place]  - Will continue PO lasix. Creatinine trending down - Patient's cardiologist is Dr. Gala Romney, will consult if no significant improvement   Diabetes mellitus, uncontrolled and with complications, neuropathy and gastroparesis: A1c 7.5  - Uncontrolled likely scheduled to steroids CBG range 175-273.  - change to moderate sliding scale insulin and increase NovoLog 70/30  Acute renal insufficiency  - Hold losartan, cautious diuresis .  - Chart review indicates baseline. Will monitor closely.   Code Status: Full Family Communication: Pt at bedside Disposition Plan: Plan d/c in 1-2 days  Debbora Presto, MD  [facility] Pager [phone]  If 7PM-7AM, please contact night-coverage [email or web address] Password [email or web address] 06/26/2011, 4:58 PM   LOS: 6 days

## 2011-06-26 NOTE — Progress Notes (Signed)
ANTIBIOTIC CONSULT NOTE - Follow Up  Pharmacy Consult for Vancomycin Indication: rule out pneumonia  Allergies  Allergen Reactions  . Pioglitazone     REACTION: edema  . Rosiglitazone Maleate Nausea And Vomiting    Patient Measurements: Height: 5\' 7"  (170.2 cm) Weight: 264 lb 15.9 oz (120.2 kg) IBW/kg (Calculated) : 61.6   Vital Signs: Temp: 98.3 F (36.8 C) (06/15 0555) Temp src: Oral (06/15 0555) BP: 156/74 mmHg (06/15 0555) Pulse Rate: 91  (06/15 0555)  Labs:  Basename 06/26/11 0526 06/25/11 0425 06/24/11 0505  WBC 12.3* 11.4* 13.5*  HGB 10.9* 10.4* 10.3*  PLT 258 241 245  LABCREA -- -- --  CREATININE 1.29* 1.36* 1.53*   Estimated Creatinine Clearance: 57.6 ml/min (by C-G formula based on Cr of 1.29).  Basename 06/23/11 1545  VANCOTROUGH 15.7  VANCOPEAK --  VANCORANDOM --  GENTTROUGH --  GENTPEAK --  GENTRANDOM --  TOBRATROUGH --  TOBRAPEAK --  TOBRARND --  AMIKACINPEAK --  AMIKACINTROU --  AMIKACIN --     Microbiology: Recent Results (from the past 720 hour(s))  URINE CULTURE     Status: Normal   Collection Time   06/21/11 12:12 AM      Component Value Range Status Comment   Specimen Description URINE, CLEAN CATCH   Final    Special Requests NONE   Final    Culture  Setup Time [id number]   Final    Colony Count 9,000 COLONIES/ML   Final    Culture INSIGNIFICANT GROWTH   Final    Report Status 06/22/2011 FINAL   Final   CULTURE, BLOOD (ROUTINE X 2)     Status: Normal (Preliminary result)   Collection Time   06/21/11  1:06 AM      Component Value Range Status Comment   Specimen Description BLOOD RIGHT ARM   Final    Special Requests BOTTLES DRAWN AEROBIC AND ANAEROBIC 5 CC EA   Final    Culture  Setup Time [id number]   Final    Culture     Final    Value:        BLOOD CULTURE RECEIVED NO GROWTH TO DATE CULTURE WILL BE HELD FOR 5 DAYS BEFORE ISSUING A FINAL NEGATIVE REPORT   Report Status PENDING   Incomplete   CULTURE, BLOOD (ROUTINE X 2)      Status: Normal (Preliminary result)   Collection Time   06/21/11  1:15 AM      Component Value Range Status Comment   Specimen Description BLOOD RIGHT ANTECUBITAL   Final    Special Requests BOTTLES DRAWN AEROBIC AND ANAEROBIC 5 CC EA   Final    Culture  Setup Time [id number]   Final    Culture     Final    Value:        BLOOD CULTURE RECEIVED NO GROWTH TO DATE CULTURE WILL BE HELD FOR 5 DAYS BEFORE ISSUING A FINAL NEGATIVE REPORT   Report Status PENDING   Incomplete   WOUND CULTURE     Status: Normal   Collection Time   06/23/11  7:54 AM      Component Value Range Status Comment   Specimen Description WOUND BACK   Final    Special Requests Immunocompromised   Final    Gram Stain     Final    Value: NO WBC SEEN     FEW SQUAMOUS EPITHELIAL CELLS PRESENT     RARE GRAM POSITIVE COCCI IN PAIRS  FEW GRAM NEGATIVE COCCOBACILLI   Culture     Final    Value: MULTIPLE ORGANISMS PRESENT, NONE PREDOMINANT     Note: NO STAPHYLOCOCCUS AUREUS ISOLATED NO GROUP A STREP (S.PYOGENES) ISOLATED   Report Status 06/26/2011 FINAL   Final    Medications:  Anti-infectives     Start     Dose/Rate Route Frequency Ordered Stop   06/25/11 1400   moxifloxacin (AVELOX) tablet 400 mg        400 mg Oral Daily 06/25/11 1215     06/22/11 0200   moxifloxacin (AVELOX) IVPB 400 mg  Status:  Discontinued        400 mg 250 mL/hr over 60 Minutes Intravenous Every 24 hours 06/21/11 1811 06/25/11 1215   06/21/11 1600   vancomycin (VANCOCIN) 1,500 mg in sodium chloride 0.9 % 500 mL IVPB  Status:  Discontinued        1,500 mg 250 mL/hr over 120 Minutes Intravenous Every 24 hours 06/21/11 1542 06/25/11 1816   06/21/11 0045   moxifloxacin (AVELOX) IVPB 400 mg        400 mg 250 mL/hr over 60 Minutes Intravenous  Once 06/21/11 0044 06/21/11 0319         Assessment:  66 yo F admit from home with acute on chronic respiratory failure.  Day #6 Vancomycin and Avelox  Cultures remain negative, CXR shows no  active disease.  Renal function continues to slowly improve; vancomycin trough level therapeutic on 6/12  Goal of Therapy:  Vancomycin trough level 15-20 mcg/ml  Plan:   Continue Vancomycin 1500mg  IV q24h. Check Vanc trough as needed. Follow up renal fxn and culture results. MD: Consider de-escalation of antibiotic coverage, or add stop date, when appropriate   Lynann Beaver PharmD, BCPS Pager [phone] 06/26/2011 10:19 AM

## 2011-06-27 DIAGNOSIS — I5023 Acute on chronic systolic (congestive) heart failure: Secondary | ICD-10-CM

## 2011-06-27 DIAGNOSIS — J9620 Acute and chronic respiratory failure, unspecified whether with hypoxia or hypercapnia: Secondary | ICD-10-CM

## 2011-06-27 DIAGNOSIS — D696 Thrombocytopenia, unspecified: Secondary | ICD-10-CM

## 2011-06-27 DIAGNOSIS — J441 Chronic obstructive pulmonary disease with (acute) exacerbation: Secondary | ICD-10-CM

## 2011-06-27 LAB — BASIC METABOLIC PANEL
BUN: 47 mg/dL — ABNORMAL HIGH (ref 6–23)
Chloride: 93 mEq/L — ABNORMAL LOW (ref 96–112)
GFR calc Af Amer: 41 mL/min — ABNORMAL LOW (ref >90)
GFR calc non Af Amer: 36 mL/min — ABNORMAL LOW (ref >90)
Glucose, Bld: 114 mg/dL — ABNORMAL HIGH (ref 70–99)
Potassium: 4.7 mEq/L (ref 3.5–5.1)
Sodium: 139 mEq/L (ref 135–145)

## 2011-06-27 LAB — GLUCOSE, CAPILLARY: Glucose-Capillary: 385 mg/dL — ABNORMAL HIGH (ref 70–99)

## 2011-06-27 LAB — CBC
HCT: 36 % (ref 36.0–46.0)
Hemoglobin: 11.3 g/dL — ABNORMAL LOW (ref 12.0–15.0)
MCH: 28.3 pg (ref 26.0–34.0)
MCV: 90.2 fL (ref 78.0–100.0)
Platelets: 243 10*3/uL (ref 150–400)
RBC: 3.99 MIL/uL (ref 3.87–5.11)
WBC: 14.7 10*3/uL — ABNORMAL HIGH (ref 4.0–10.5)

## 2011-06-27 LAB — CULTURE, BLOOD (ROUTINE X 2): Culture  Setup Time: 201306100902

## 2011-06-27 LAB — EXPECTORATED SPUTUM ASSESSMENT W GRAM STAIN, RFLX TO RESP C: Special Requests: NORMAL

## 2011-06-27 MED ORDER — FUROSEMIDE 40 MG PO TABS
40.0000 mg | ORAL_TABLET | Freq: Every day | ORAL | Status: DC
  Filled 2011-06-27: qty 1

## 2011-06-27 MED ORDER — FUROSEMIDE 10 MG/ML IJ SOLN: 40.0000 mg | Freq: Two times a day (BID) | INTRAMUSCULAR | Status: DC

## 2011-06-27 MED ORDER — LEVOFLOXACIN IN D5W 750 MG/150ML IV SOLN
750.0000 mg | INTRAVENOUS | Status: DC
  Administered 2011-06-27 – 2011-06-28 (×2): 750 mg via INTRAVENOUS
  Filled 2011-06-27 (×2): qty 150

## 2011-06-27 MED ORDER — FUROSEMIDE 40 MG PO TABS
40.0000 mg | ORAL_TABLET | Freq: Every day | ORAL | Status: DC
  Administered 2011-06-28 – 2011-07-02 (×5): 40 mg via ORAL
  Filled 2011-06-27 (×5): qty 1

## 2011-06-27 MED ORDER — SODIUM CHLORIDE 0.9 % IV SOLN
1500.0000 mg | INTRAVENOUS | Status: DC
  Administered 2011-06-27 – 2011-06-28 (×2): 1500 mg via INTRAVENOUS
  Filled 2011-06-27 (×2): qty 1500

## 2011-06-27 MED ORDER — DEXTROSE 5 % IV SOLN
1.0000 g | Freq: Three times a day (TID) | INTRAVENOUS | Status: DC
  Administered 2011-06-27 – 2011-06-28 (×4): 1 g via INTRAVENOUS
  Filled 2011-06-27 (×5): qty 1

## 2011-06-27 MED ORDER — PREDNISONE 20 MG PO TABS
40.0000 mg | ORAL_TABLET | Freq: Every day | ORAL | Status: DC
  Administered 2011-06-28: 40 mg via ORAL
  Filled 2011-06-27 (×2): qty 2

## 2011-06-27 NOTE — Progress Notes (Signed)
Patient ID: [name], female   DOB: 06-27-1945, 66 y.o.   MRN: [id number]  [facility] PROGRESS NOTE  [name] WUJ:[id number] DOB: 1945/07/05 DOA: 06/20/2011 PCP: Romero Belling, MD  Brief narrative:  66 yo female hx copd on home o2 admitted 6/10 with copd exacerbation and pna and mild CHF   Consultants:  None  Procedures:  None  Antibiotics:  Avelox and Vanc 6/10 --> 6/14  Vanc, Levaquin, Maxipime 06/26/2011 -->  HPI/Subjective: Pt reports slightly more shortness of breath.   Objective: Filed Vitals:   06/27/11 0734 06/27/11 1033 06/27/11 1417 06/27/11 1931  BP:  115/62 153/62   Pulse: 86  51   Temp:   98.2 F (36.8 C)   TempSrc:   Oral   Resp: 18  18   Height:      Weight:      SpO2: 98%  94% 96%    Intake/Output Summary (Last 24 hours) at 06/27/11 1956 Last data filed at 06/27/11 1900  Gross per 24 hour  Intake   2020 ml  Output   2950 ml  Net   -930 ml    Exam:   General:  Pt is alert, follows commands appropriately, not in acute distress  Cardiovascular: Regular rate and rhythm, S1/S2, no murmurs, no rubs, no gallops  Respiratory: Clear to auscultation bilaterally but decreased breath sounds at bases, no wheezing, no crackles  Abdomen: Soft, non tender, non distended, bowel sounds present, no guarding  Extremities: Trace bilateral lower extremity edema, pulses DP and PT palpable bilaterally  Neuro: Grossly nonfocal  Data Reviewed: Basic Metabolic Panel:  Lab 06/27/11 9562 06/26/11 0526 06/25/11 0425 06/24/11 0505 06/21/11 0735  NA 139 137 137 137 134*  K 4.7 5.0 4.7 4.5 4.6  CL 93* 95* 98 97 93*  CO2 41* 38* 35* 32 30  GLUCOSE 114* 192* 264* 257* 167*  BUN 47* 49* 54* 62* 33*  CREATININE 1.48* 1.29* 1.36* 1.53* --  CALCIUM 9.6 9.1 8.6 8.3* 8.9   Liver Function Tests:  Lab 06/21/11 0735  AST 20  ALT 15  ALKPHOS 47  BILITOT 0.3  PROT 7.6  ALBUMIN 3.3*   CBC:  Lab 06/27/11 0557 06/26/11 0526 06/25/11 0425 06/24/11 0505  06/21/11 0735  WBC 14.7* 12.3* 11.4* 13.5* 13.4*  HGB 11.3* 10.9* 10.4* 10.3* 10.9*  HCT 36.0 34.8* 33.0* 33.0* 33.2*  MCV 90.2 90.4 90.4 90.7 88.8  PLT 243 258 241 245 197   Cardiac Enzymes:  Lab 06/21/11 0735 06/21/11 0212 06/20/11 2350  CKTOTAL 159 121 98  CKMB 4.7* 3.6 3.0  TROPONINI <0.30 <0.30 <0.30   CBG:  Lab 06/27/11 1656 06/27/11 1205 06/27/11 0719 06/26/11 2219 06/26/11 1720  GLUCAP 385* 143* 98 206* 370*    Recent Results (from the past 240 hour(s))  URINE CULTURE     Status: Normal   Collection Time   06/21/11 12:12 AM      Component Value Range Status Comment   Specimen Description URINE, CLEAN CATCH   Final    Special Requests NONE   Final    Culture  Setup Time [id number]   Final    Colony Count 9,000 COLONIES/ML   Final    Culture INSIGNIFICANT GROWTH   Final    Report Status 06/22/2011 FINAL   Final   CULTURE, BLOOD (ROUTINE X 2)     Status: Normal   Collection Time   06/21/11  1:06 AM      Component Value Range  Status Comment   Specimen Description BLOOD RIGHT ARM   Final    Special Requests BOTTLES DRAWN AEROBIC AND ANAEROBIC 5 CC EA   Final    Culture  Setup Time [id number]   Final    Culture NO GROWTH 5 DAYS   Final    Report Status 06/27/2011 FINAL   Final   CULTURE, BLOOD (ROUTINE X 2)     Status: Normal   Collection Time   06/21/11  1:15 AM      Component Value Range Status Comment   Specimen Description BLOOD RIGHT ANTECUBITAL   Final    Special Requests BOTTLES DRAWN AEROBIC AND ANAEROBIC 5 CC EA   Final    Culture  Setup Time [id number]   Final    Culture NO GROWTH 5 DAYS   Final    Report Status 06/27/2011 FINAL   Final   WOUND CULTURE     Status: Normal   Collection Time   06/23/11  7:54 AM      Component Value Range Status Comment   Specimen Description WOUND BACK   Final    Special Requests Immunocompromised   Final    Gram Stain     Final    Value: NO WBC SEEN     FEW SQUAMOUS EPITHELIAL CELLS PRESENT     RARE GRAM POSITIVE  COCCI IN PAIRS     FEW GRAM NEGATIVE COCCOBACILLI   Culture     Final    Value: MULTIPLE ORGANISMS PRESENT, NONE PREDOMINANT     Note: NO STAPHYLOCOCCUS AUREUS ISOLATED NO GROUP A STREP (S.PYOGENES) ISOLATED   Report Status 06/26/2011 FINAL   Final   CULTURE, EXPECTORATED SPUTUM-ASSESSMENT     Status: Normal   Collection Time   06/27/11 12:26 PM      Component Value Range Status Comment   Specimen Description SPUTUM   Final    Special Requests Normal   Final    Sputum evaluation     Final    Value: THIS SPECIMEN IS ACCEPTABLE. RESPIRATORY CULTURE REPORT TO FOLLOW.   Report Status 06/27/2011 FINAL   Final      Studies:  Dg Chest 2 View 06/26/2011 I MPRESSION:  1.  Right middle lobe airspace consolidation concerning for right middle lobe pneumonia.  2.  Small bilateral apical nodules (right greater than left). These may simply represent areas of nodular scarring from prior infection, however, continued attention on future follow up studies is recommended to ensure stability over time.  3.  Mild cardiomegaly is unchanged.  4.  Atherosclerosis.    Scheduled Meds:   . amLODipine  5 mg Oral Daily  . aspirin EC  81 mg Oral Daily  . budesonide-formoterol  2 puff Inhalation BID  . calcitRIOL  0.25 mcg Oral Daily  . ceFEPime (MAXIPIME) IV  1 g Intravenous Q8H  . citalopram  40 mg Oral Daily  . enoxaparin (LOVENOX) injection  60 mg Subcutaneous Q24H  . ferrous sulfate  325 mg Oral Q breakfast  . furosemide  40 mg Oral Daily  . insulin aspart  0-15 Units Subcutaneous TID WC  . insulin aspart  0-5 Units Subcutaneous QHS  . insulin aspart protamine-insulin aspart  50 Units Subcutaneous BID WC  . ipratropium  0.5 mg Nebulization Q6H  . levalbuterol  0.63 mg Nebulization Q6H  . levofloxacin (LEVAQUIN) IV  750 mg Intravenous Q24H  . multivitamin with minerals  1 tablet Oral Daily  . predniSONE  50 mg Oral Q  breakfast  . sodium chloride  3 mL Intravenous Q12H  . vancomycin  1,500 mg  Intravenous Q24H   Continuous Infusions:    Assessment/Plan:  Respiratory failure, acute-on-chronic:  - Multifactorial, with a history of chronic systolic CHF, coronary artery disease, COPD .  - Pt is clinically improving  - Will continue scheduled Xopenex and Atrovent nebs, O2 via [place] 5 L, Prednisone, Symbicort - D-dimer elevated, ordered VQ scan however patient refused VQ scan due to dyspnea and unable to lie flat .  - Continue incentive spirometry. - CXR shows ? Right lobe PNA but does not appear to be much different from initial CXR - will likely be able to narrow down the spectrum in AM  CAD (coronary artery disease)  - Cardiac enzymes x3 negative, No chest pain  - 2-D echo with EF of 30-35%, moderately to severely reduced systolic function  - no significant difference from previous ECHO 3/13  - Continue aspirin, not on beta blocker outpatient likely secondary to chronic lung disease .  - Holding losartan secondary to creatinine . Volume status -8 L.   Healthcare-associated pneumonia:  - Afebrile non-toxic appearing. Blood cultures no growth.   COPD with acute exacerbation:  - see #1. Will taper down to oral prednisone.   Systolic CHF, acute on chronic: despite  - diuresis, weight 120kg from 123.0kg 2 days ago. Remains on 5l O2 [place]  - Will continue PO lasix. Creatinine trending down  - Patient's cardiologist is Dr. Gala Romney, will consult if no significant improvement   Diabetes mellitus, uncontrolled and with complications, neuropathy and gastroparesis: A1c 7.5  - Uncontrolled likely scheduled to steroids CBG range 175-273.  - change to moderate sliding scale insulin and increase NovoLog 70/30   Acute renal insufficiency  - Hold losartan, cautious diuresis .  - Chart review indicates baseline. Will monitor closely.   Code Status: Full  Family Communication: Pt at bedside  Disposition Plan: Plan d/c in 1-2 days   Debbora Presto, MD  [facility] Pager [phone]  If 7PM-7AM, please contact night-coverage [email or web address] Password [email or web address] 06/27/2011, 7:56 PM   LOS: 7 days

## 2011-06-27 NOTE — Progress Notes (Signed)
ANTIBIOTIC CONSULT NOTE - Follow Up  Pharmacy Consult for Vancomycin (Restart) Indication: rule out pneumonia  Allergies  Allergen Reactions  . Pioglitazone     REACTION: edema  . Rosiglitazone Maleate Nausea And Vomiting    Patient Measurements: Height: 5\' 7"  (170.2 cm) Weight: 260 lb 5.8 oz (118.1 kg) IBW/kg (Calculated) : 61.6   Vital Signs: Temp: 97.8 F (36.6 C) (06/16 0619) Temp src: Oral (06/16 0619) BP: 138/67 mmHg (06/16 0619) Pulse Rate: 86  (06/16 0734)  Labs:  Basename 06/27/11 0557 06/26/11 0526 06/25/11 0425  WBC 14.7* 12.3* 11.4*  HGB 11.3* 10.9* 10.4*  PLT 243 258 241  LABCREA -- -- --  CREATININE 1.48* 1.29* 1.36*   Estimated Creatinine Clearance: 49.7 ml/min (by C-G formula based on Cr of 1.48). No results found for this basename: VANCOTROUGH:2,VANCOPEAK:2,VANCORANDOM:2,GENTTROUGH:2,GENTPEAK:2,GENTRANDOM:2,TOBRATROUGH:2,TOBRAPEAK:2,TOBRARND:2,AMIKACINPEAK:2,AMIKACINTROU:2,AMIKACIN:2, in the last 72 hours   Microbiology: Recent Results (from the past 720 hour(s))  URINE CULTURE     Status: Normal   Collection Time   06/21/11 12:12 AM      Component Value Range Status Comment   Specimen Description URINE, CLEAN CATCH   Final    Special Requests NONE   Final    Culture  Setup Time [id number]   Final    Colony Count 9,000 COLONIES/ML   Final    Culture INSIGNIFICANT GROWTH   Final    Report Status 06/22/2011 FINAL   Final   CULTURE, BLOOD (ROUTINE X 2)     Status: Normal (Preliminary result)   Collection Time   06/21/11  1:06 AM      Component Value Range Status Comment   Specimen Description BLOOD RIGHT ARM   Final    Special Requests BOTTLES DRAWN AEROBIC AND ANAEROBIC 5 CC EA   Final    Culture  Setup Time [id number]   Final    Culture     Final    Value:        BLOOD CULTURE RECEIVED NO GROWTH TO DATE CULTURE WILL BE HELD FOR 5 DAYS BEFORE ISSUING A FINAL NEGATIVE REPORT   Report Status PENDING   Incomplete   CULTURE, BLOOD (ROUTINE X 2)      Status: Normal (Preliminary result)   Collection Time   06/21/11  1:15 AM      Component Value Range Status Comment   Specimen Description BLOOD RIGHT ANTECUBITAL   Final    Special Requests BOTTLES DRAWN AEROBIC AND ANAEROBIC 5 CC EA   Final    Culture  Setup Time [id number]   Final    Culture     Final    Value:        BLOOD CULTURE RECEIVED NO GROWTH TO DATE CULTURE WILL BE HELD FOR 5 DAYS BEFORE ISSUING A FINAL NEGATIVE REPORT   Report Status PENDING   Incomplete   WOUND CULTURE     Status: Normal   Collection Time   06/23/11  7:54 AM      Component Value Range Status Comment   Specimen Description WOUND BACK   Final    Special Requests Immunocompromised   Final    Gram Stain     Final    Value: NO WBC SEEN     FEW SQUAMOUS EPITHELIAL CELLS PRESENT     RARE GRAM POSITIVE COCCI IN PAIRS     FEW GRAM NEGATIVE COCCOBACILLI   Culture     Final    Value: MULTIPLE ORGANISMS PRESENT, NONE PREDOMINANT     Note: NO  STAPHYLOCOCCUS AUREUS ISOLATED NO GROUP A STREP (S.PYOGENES) ISOLATED   Report Status 06/26/2011 FINAL   Final    Medications:  Anti-infectives     Start     Dose/Rate Route Frequency Ordered Stop   06/27/11 1300   levofloxacin (LEVAQUIN) IVPB 750 mg        750 mg 100 mL/hr over 90 Minutes Intravenous Every 24 hours 06/27/11 0935 06/30/11 1259   06/27/11 1200   ceFEPIme (MAXIPIME) 1 g in dextrose 5 % 50 mL IVPB        1 g 100 mL/hr over 30 Minutes Intravenous Every 8 hours 06/27/11 0935 07/05/11 1159   06/25/11 1400   moxifloxacin (AVELOX) tablet 400 mg  Status:  Discontinued        400 mg Oral Daily 06/25/11 1215 06/27/11 0935   06/22/11 0200   moxifloxacin (AVELOX) IVPB 400 mg  Status:  Discontinued        400 mg 250 mL/hr over 60 Minutes Intravenous Every 24 hours 06/21/11 1811 06/25/11 1215   06/21/11 1600   vancomycin (VANCOCIN) 1,500 mg in sodium chloride 0.9 % 500 mL IVPB  Status:  Discontinued        1,500 mg 250 mL/hr over 120 Minutes Intravenous Every  24 hours 06/21/11 1542 06/25/11 1816   06/21/11 0045   moxifloxacin (AVELOX) IVPB 400 mg        400 mg 250 mL/hr over 60 Minutes Intravenous  Once 06/21/11 0044 06/21/11 0319         Assessment:  66 yo F admit from home on 6/10 with acute on chronic respiratory failure.  Pt has completed 4 days of Vanco (6/10-6/13) and 6 Days of Avelox (6/10-6/14).   6/15 CXR shows worsening PNA (per MD assessment).  Abx have no been switched to Cefepime, Levaquin and Vanco for HAP  Dr. Izola Price aware pt has already had 4 days of Vanco. Plan is to resume Vanco now x8 more days, with possible d/c earlier based on Cx and clinical assessment.  Will resume at previously therapeutic vanco dose.  Goal of Therapy:  Vancomycin trough level 15-20 mcg/ml  Plan:   Resume Vancomycin 1500mg  IV q24h.   Darrol Angel, PharmD Pager: [phone] 06/27/2011 9:49 AM

## 2011-06-28 DIAGNOSIS — J9620 Acute and chronic respiratory failure, unspecified whether with hypoxia or hypercapnia: Secondary | ICD-10-CM

## 2011-06-28 DIAGNOSIS — J441 Chronic obstructive pulmonary disease with (acute) exacerbation: Secondary | ICD-10-CM

## 2011-06-28 DIAGNOSIS — D696 Thrombocytopenia, unspecified: Secondary | ICD-10-CM

## 2011-06-28 DIAGNOSIS — I5023 Acute on chronic systolic (congestive) heart failure: Secondary | ICD-10-CM

## 2011-06-28 LAB — LEGIONELLA ANTIGEN, URINE: Legionella Antigen, Urine: NEGATIVE

## 2011-06-28 LAB — CBC
MCHC: 31.5 g/dL (ref 30.0–36.0)
Platelets: 239 10*3/uL (ref 150–400)
RDW: 14.2 % (ref 11.5–15.5)

## 2011-06-28 LAB — BASIC METABOLIC PANEL
BUN: 45 mg/dL — ABNORMAL HIGH (ref 6–23)
GFR calc Af Amer: 42 mL/min — ABNORMAL LOW (ref >90)
GFR calc non Af Amer: 37 mL/min — ABNORMAL LOW (ref >90)
Potassium: 5 mEq/L (ref 3.5–5.1)
Sodium: 138 mEq/L (ref 135–145)

## 2011-06-28 MED ORDER — LEVOFLOXACIN 750 MG PO TABS
750.0000 mg | ORAL_TABLET | Freq: Every day | ORAL | Status: DC
  Administered 2011-06-28 – 2011-07-01 (×4): 750 mg via ORAL
  Filled 2011-06-28 (×4): qty 1

## 2011-06-28 MED ORDER — PREDNISONE 20 MG PO TABS
30.0000 mg | ORAL_TABLET | Freq: Every day | ORAL | Status: DC
  Administered 2011-06-29: 30 mg via ORAL
  Filled 2011-06-28 (×2): qty 1

## 2011-06-28 NOTE — Progress Notes (Signed)
Patient ID: [name], female   DOB: [birth year], 66 y.o.   MRN: [id number]  [facility] PROGRESS NOTE  AALEEYAH [name] WUJ:[id number] DOB: 01-08-1946 DOA: 06/20/2011 PCP: Romero Belling, MD   HPI/Subjective: Pt reports feeling better. Denies shortness of breath.   Objective: Filed Vitals:   06/28/11 0616 06/28/11 0740 06/28/11 1340 06/28/11 1446  BP: 135/69  148/72   Pulse: 90  95   Temp: 98 F (36.7 C)  98.2 F (36.8 C)   TempSrc: Oral  Oral   Resp: 20  20   Height:      Weight: 118.3 kg (260 lb 12.9 oz)     SpO2: 94% 99% 94% 95%    Intake/Output Summary (Last 24 hours) at 06/28/11 1907 Last data filed at 06/28/11 1700  Gross per 24 hour  Intake   1400 ml  Output   2450 ml  Net  -1050 ml    Exam:   General:  Pt is alert, follows commands appropriately, not in acute distress  Cardiovascular: Regular rate and rhythm, S1/S2, no murmurs, no rubs, no gallops  Respiratory: Clear to auscultation bilaterally but decreased breath sounds at bases, no wheezing, no crackles, no rhonchi  Abdomen: Soft, non tender, non distended, bowel sounds present, no guarding  Extremities: No edema, pulses DP and PT palpable bilaterally  Neuro: Grossly nonfocal  Data Reviewed: Basic Metabolic Panel:  Lab 06/28/11 9562 06/27/11 0557 06/26/11 0526 06/25/11 0425 06/24/11 0505  NA 138 139 137 137 137  K 5.0 4.7 5.0 4.7 4.5  CL 95* 93* 95* 98 97  CO2 39* 41* 38* 35* 32  GLUCOSE 124* 114* 192* 264* 257*  BUN 45* 47* 49* 54* 62*  CREATININE 1.45* 1.48* 1.29* 1.36* 1.53*  CALCIUM 9.2 9.6 9.1 8.6 8.3*  MG -- -- -- -- --  PHOS -- -- -- -- --   CBC:  Lab 06/28/11 0445 06/27/11 0557 06/26/11 0526 06/25/11 0425 06/24/11 0505  WBC 14.1* 14.7* 12.3* 11.4* 13.5*  HGB 10.9* 11.3* 10.9* 10.4* 10.3*  HCT 34.6* 36.0 34.8* 33.0* 33.0*  MCV 90.1 90.2 90.4 90.4 90.7  PLT 239 243 258 241 245   CBG:  Lab 06/28/11 1703 06/28/11 1154 06/28/11 0739 06/27/11 2223 06/27/11 1656  GLUCAP 253*  242* 108* 173* 385*    Recent Results (from the past 240 hour(s))  URINE CULTURE     Status: Normal   Collection Time   06/21/11 12:12 AM      Component Value Range Status Comment   Specimen Description URINE, CLEAN CATCH   Final    Special Requests NONE   Final    Culture  Setup Time [id number]   Final    Colony Count 9,000 COLONIES/ML   Final    Culture INSIGNIFICANT GROWTH   Final    Report Status 06/22/2011 FINAL   Final   CULTURE, BLOOD (ROUTINE X 2)     Status: Normal   Collection Time   06/21/11  1:06 AM      Component Value Range Status Comment   Specimen Description BLOOD RIGHT ARM   Final    Special Requests BOTTLES DRAWN AEROBIC AND ANAEROBIC 5 CC EA   Final    Culture  Setup Time [id number]   Final    Culture NO GROWTH 5 DAYS   Final    Report Status 06/27/2011 FINAL   Final   CULTURE, BLOOD (ROUTINE X 2)     Status: Normal   Collection Time  06/21/11  1:15 AM      Component Value Range Status Comment   Specimen Description BLOOD RIGHT ANTECUBITAL   Final    Special Requests BOTTLES DRAWN AEROBIC AND ANAEROBIC 5 CC EA   Final    Culture  Setup Time [id number]   Final    Culture NO GROWTH 5 DAYS   Final    Report Status 06/27/2011 FINAL   Final   WOUND CULTURE     Status: Normal   Collection Time   06/23/11  7:54 AM      Component Value Range Status Comment   Specimen Description WOUND BACK   Final    Special Requests Immunocompromised   Final    Gram Stain     Final    Value: NO WBC SEEN     FEW SQUAMOUS EPITHELIAL CELLS PRESENT     RARE GRAM POSITIVE COCCI IN PAIRS     FEW GRAM NEGATIVE COCCOBACILLI   Culture     Final    Value: MULTIPLE ORGANISMS PRESENT, NONE PREDOMINANT     Note: NO STAPHYLOCOCCUS AUREUS ISOLATED NO GROUP A STREP (S.PYOGENES) ISOLATED   Report Status 06/26/2011 FINAL   Final   CULTURE, BLOOD (ROUTINE X 2)     Status: Normal (Preliminary result)   Collection Time   06/27/11 10:40 AM      Component Value Range Status Comment    Specimen Description BLOOD RIGHT ARM   Final    Special Requests     Final    Value: BOTTLES DRAWN AEROBIC AND ANAEROBIC 5CC BOTH BOTTLES   Culture  Setup Time [id number]   Final    Culture     Final    Value:        BLOOD CULTURE RECEIVED NO GROWTH TO DATE CULTURE WILL BE HELD FOR 5 DAYS BEFORE ISSUING A FINAL NEGATIVE REPORT   Report Status PENDING   Incomplete   CULTURE, BLOOD (ROUTINE X 2)     Status: Normal (Preliminary result)   Collection Time   06/27/11 10:45 AM      Component Value Range Status Comment   Specimen Description BLOOD LEFT ARM   Final    Special Requests BOTTLES DRAWN AEROBIC ONLY 1CC   Final    Culture  Setup Time [id number]   Final    Culture     Final    Value:        BLOOD CULTURE RECEIVED NO GROWTH TO DATE CULTURE WILL BE HELD FOR 5 DAYS BEFORE ISSUING A FINAL NEGATIVE REPORT   Report Status PENDING   Incomplete   CULTURE, EXPECTORATED SPUTUM-ASSESSMENT     Status: Normal   Collection Time   06/27/11 12:26 PM      Component Value Range Status Comment   Specimen Description SPUTUM   Final    Special Requests Normal   Final    Sputum evaluation     Final    Value: THIS SPECIMEN IS ACCEPTABLE. RESPIRATORY CULTURE REPORT TO FOLLOW.   Report Status 06/27/2011 FINAL   Final   CULTURE, RESPIRATORY     Status: Normal (Preliminary result)   Collection Time   06/27/11 12:26 PM      Component Value Range Status Comment   Specimen Description SPUTUM   Final    Special Requests NONE   Final    Gram Stain     Final    Value: FEW WBC PRESENT,BOTH PMN AND MONONUCLEAR     FEW SQUAMOUS EPITHELIAL  CELLS PRESENT     FEW GRAM POSITIVE COCCI IN PAIRS     RARE GRAM POSITIVE RODS   Culture NORMAL OROPHARYNGEAL FLORA   Final    Report Status PENDING   Incomplete      Studies: No results found.  Scheduled Meds:   . amLODipine  5 mg Oral Daily  . aspirin EC  81 mg Oral Daily  . budesonide-formoterol  2 puff Inhalation BID  . calcitRIOL  0.25 mcg Oral Daily  .  citalopram  40 mg Oral Daily  . enoxaparin (LOVENOX) injection  60 mg Subcutaneous Q24H  . ferrous sulfate  325 mg Oral Q breakfast  . furosemide  40 mg Oral Daily  . insulin aspart  0-15 Units Subcutaneous TID WC  . insulin aspart  0-5 Units Subcutaneous QHS  . insulin aspart protamine-insulin aspart  50 Units Subcutaneous BID WC  . ipratropium  0.5 mg Nebulization Q6H  . levalbuterol  0.63 mg Nebulization Q6H  . levofloxacin (LEVAQUIN) IV  750 mg Intravenous Q24H  . multivitamin with minerals  1 tablet Oral Daily  . predniSONE  40 mg Oral Q breakfast  . sodium chloride  3 mL Intravenous Q12H  . DISCONTD: ceFEPime (MAXIPIME) IV  1 g Intravenous Q8H  . DISCONTD: predniSONE  50 mg Oral Q breakfast  . DISCONTD: vancomycin  1,500 mg Intravenous Q24H   Continuous Infusions:    Assessment/Plan:  Respiratory failure, acute-on-chronic:  - Multifactorial, with a history of chronic systolic CHF, coronary artery disease, COPD .  - Pt is clinically improving  - Will continue scheduled Xopenex and Atrovent nebs, O2 via [place] 5 L, Prednisone, Symbicort  - D-dimer elevated, ordered VQ scan however patient refused VQ scan due to dyspnea and unable to lie flat .  - Continue incentive spirometry.  - CXR shows ? Right lobe PNA but does not appear to be much different from initial CXR  - narrow down spectrum to Levaquin and change to PO in AM  CAD (coronary artery disease)  - Cardiac enzymes x3 negative, No chest pain  - 2-D echo with EF of 30-35%, moderately to severely reduced systolic function  - no significant difference from previous ECHO 3/13  - Continue aspirin, not on beta blocker outpatient likely secondary to chronic lung disease .  - Holding losartan secondary to creatinine . Volume status -8 L.   Healthcare-associated pneumonia:  - Afebrile non-toxic appearing. Blood cultures no growth.   COPD with acute exacerbation:  - see #1. Will taper down to oral prednisone.   Systolic CHF,  acute on chronic: despite  - diuresis, weight 120kg from 123.0kg 2 days ago. Remains on 5l O2 [place]  - Will continue PO lasix. Creatinine trending down  - Patient's cardiologist is Dr. Gala Romney, will consult if no significant improvement   Diabetes mellitus, uncontrolled and with complications, neuropathy and gastroparesis: A1c 7.5  - Uncontrolled likely scheduled to steroids CBG range 175-273.  - change to moderate sliding scale insulin and increase NovoLog 70/30   Acute renal insufficiency  - Hold losartan, cautious diuresis .  - Chart review indicates baseline. Will monitor closely.    Code Status: Full Family Communication: Pt at bedside Disposition Plan: Likely d/c in AM  Debbora Presto, MD  [facility] Pager [phone]  If 7PM-7AM, please contact night-coverage [email or web address] Password [email or web address] 06/28/2011, 7:07 PM   LOS: 8 days

## 2011-06-28 NOTE — Progress Notes (Signed)
PT Cancellation Note  Treatment cancelled today due to patient's refusal to participate.  Pt states she is too tired to get up and walk today.  Will check back with pt as schedule permits.    Thanks,   Page, Meribeth Mattes 06/28/2011, 4:04 PM

## 2011-06-29 DIAGNOSIS — J441 Chronic obstructive pulmonary disease with (acute) exacerbation: Secondary | ICD-10-CM

## 2011-06-29 DIAGNOSIS — I5023 Acute on chronic systolic (congestive) heart failure: Secondary | ICD-10-CM

## 2011-06-29 DIAGNOSIS — D696 Thrombocytopenia, unspecified: Secondary | ICD-10-CM

## 2011-06-29 DIAGNOSIS — J9620 Acute and chronic respiratory failure, unspecified whether with hypoxia or hypercapnia: Secondary | ICD-10-CM

## 2011-06-29 LAB — GLUCOSE, CAPILLARY: Glucose-Capillary: 85 mg/dL (ref 70–99)

## 2011-06-29 LAB — CBC
HCT: 32.6 % — ABNORMAL LOW (ref 36.0–46.0)
Hemoglobin: 10.3 g/dL — ABNORMAL LOW (ref 12.0–15.0)
MCH: 28.4 pg (ref 26.0–34.0)
MCHC: 31.6 g/dL (ref 30.0–36.0)
MCV: 89.8 fL (ref 78.0–100.0)

## 2011-06-29 LAB — BASIC METABOLIC PANEL
BUN: 46 mg/dL — ABNORMAL HIGH (ref 6–23)
Calcium: 8.8 mg/dL (ref 8.4–10.5)
GFR calc non Af Amer: 35 mL/min — ABNORMAL LOW (ref >90)
Glucose, Bld: 88 mg/dL (ref 70–99)
Potassium: 4.7 mEq/L (ref 3.5–5.1)

## 2011-06-29 LAB — CULTURE, RESPIRATORY W GRAM STAIN: Culture: NORMAL

## 2011-06-29 MED ORDER — PREDNISONE 10 MG PO TABS
10.0000 mg | ORAL_TABLET | Freq: Every day | ORAL | Status: DC
  Administered 2011-06-30 – 2011-07-02 (×3): 10 mg via ORAL
  Filled 2011-06-29 (×4): qty 1

## 2011-06-29 MED ORDER — LEVOFLOXACIN 750 MG PO TABS: 750.0000 mg | ORAL_TABLET | Freq: Every day | ORAL | Status: DC

## 2011-06-29 NOTE — Discharge Summary (Addendum)
Physician Discharge Summary  [name]:[id number] DOB: 1945/08/07 DOA: 06/20/2011  PCP: Romero Belling, MD  Admit date: 06/20/2011 Discharge date: 07/02/2011  Recommendations for Outpatient Follow-up:  1. Pt will need to complete the course of antibiotic Levaquin for 4 more doses, given every other day. Also, pt has completed the course of steroid taper while in hospital and will not need prescription for prednisone upon discharge. In addition, she was advised to follow up with primary pulmonologist Dr. Sherene Sires and PCP as needed. She is on oxygen at home 4 liters. Please also note that Losartan was d/c during the hospitalization due to worsening renal failure and BP has remained stable off Losartan. This can be restarted if indicated.  Discharge Diagnoses: Acute on chronic respiratory failure secondary to advance oxygen dependent COPD, combined systolic and diastolic CHF, Strep PNA  Principal Problem:  *Respiratory failure, acute-on-chronic Active Problems:  Chronic systolic congestive heart failure  CAD (coronary artery disease)  COPD with acute exacerbation  Systolic CHF, acute on chronic  Diabetes mellitus  Acute renal insufficiency  Discharge Condition: Stable  Diet recommendation: Heart healthy with limit of 2 g sodium per day.  History of present illness:  Pt is 66 yo female with advanced COPD on home O2, 4LNC, combined systolic and diastolic CHF who initially presented to [facility]/20/2011 with progressive shortness of breath associated with productive cough, fevers, chills, fatigue and generalizes weakness that started 2 days prior to admission and associated with progressively worsening oral intake, weight gain and lower extremity swelling.  Hospital Course:   Respiratory failure, acute-on-chronic:  - Multifactorial, with a history of chronic systolic and diastolicCHF, coronary artery disease,advanced and oxygen dependent COPD .  - Pt was started on complex medical regimen  including antibiotics Vancomycin, Maxipime, Levaquin to cover for HCAP - pt was also started on Lasix IV 40 mg BID for acute exacerbation of CHF - in addition pt was treated for COPD exacerbation: solumedrol with transition to Prednisone, nebulizers PRN and scheduled - pt has responded well to the therapy and has maintained oxygen saturations > 95% on 4 L O2 - D-dimer elevated, ordered VQ scan however patient refused VQ scan due to dyspnea and unable to lie flat . - Ms Bielak's CT chest showed a slightly enlarged RUL nodule(now 7mm diameter), and will need follow up in 2-3 months, given enlargement if a few months- ct ordered as continued to be dyspneic.   CAD (coronary artery disease)  - Cardiac enzymes x3 negative, No chest pain  - 2-D echo with EF of 30-35%, moderately to severely reduced systolic function  - no significant difference from previous ECHO 03/2011  - Continue aspirin, not on beta blocker outpatient likely secondary to chronic lung disease .  - Holding losartan secondary to creatinine . Volume status -8 L.   Healthcare-associated pneumonia:  - treated per protocol with broad spectrum antibiotics  - spectrum narrowed as Strep Pneumo +  COPD with acute exacerbation:  - see #1. We tapered down to oral prednisone.   Systolic CHF, acute on chronic: despite  - diuresis, weight 117 kg from 123 kg on admission. Remains on 4l O2 [place]  - Will continue PO lasix. Creatinine trending down  - Patient's cardiologist is Dr. Gala Romney  Diabetes mellitus, uncontrolled and with complications, neuropathy and gastroparesis:  - A1c 7.5  - Uncontrolled likely scheduled to steroids CBG range 175-273.  - increased NovoLog 70/30   Acute renal insufficiency  - Holding losartan, cautious diuresis .  -  Chart review indicates baseline. Will monitor closely.   Code Status: Full  Family Communication: Pt at bedside  Disposition Plan: d/c to snf for short term rehab  today.   Procedures:  none  Consultations:  none  Discharge Exam: Filed Vitals:   06/29/11 1328  BP: 120/70  Pulse: 95  Temp: 98.4 F (36.9 C)  Resp: 18   Filed Vitals:   07/01/11 2127 07/02/11 0158 07/02/11 0558 07/02/11 0813  BP: 127/75  171/77   Pulse: 78  76   Temp: 97.8 F (36.6 C)  97.7 F (36.5 C)   TempSrc: Oral  Oral   Resp: 18  20   Height:      Weight:   119.477 kg (263 lb 6.4 oz)   SpO2: 93% 94% 97% 98%    General: Pt is alert, follows commands appropriately, not in acute distress Cardiovascular: Regular rate and rhythm, S1/S2 +, no murmurs, no rubs, no gallops Respiratory: Clear to auscultation bilaterally with slightly decreased breath sounds at bases, no wheezing, no crackles, no rhonchi Abdominal: Soft, non tender, non distended, bowel sounds +, no guarding Extremities: no edema, no cyanosis, pulses palpable bilaterally DP and PT Neuro: Grossly nonfocal  Discharge Instructions  Discharge Orders    Future Appointments: Provider: Department: Dept Phone: Center:   07/06/2011 10:00 AM [facility] [phone] None   07/06/2011 11:15 AM Nyoka Cowden, MD [facility] [phone] None   07/07/2011 12:00 PM [facility] [phone] None     Future Orders Please Complete By Expires   Diet - low sodium heart healthy      Increase activity slowly        Medication List  As of 07/02/2011 12:37 PM   STOP taking these medications         losartan 50 MG tablet         TAKE these medications         albuterol 108 (90 BASE) MCG/ACT inhaler   Commonly known as: PROVENTIL HFA;VENTOLIN HFA   Inhale 2 puffs into the lungs every 6 (six) hours as needed. For wheeze or shortness of breath      albuterol (2.5 MG/3ML) 0.083% nebulizer solution   Commonly known as: PROVENTIL   USE ONE VIAL IN NEBULIZER 4 TIMES DAILY OR EVERY 6 HOURS AS NEEDED      ALPRAZolam 0.25 MG tablet   Commonly known as: XANAX    TAKE ONE TABLET BY MOUTH TWICE DAILY AS NEEDED FOR ANXIETY. DO NOT EXCEED 2 PER DAY      amLODipine 5 MG tablet   Commonly known as: NORVASC   Take 1 tablet (5 mg total) by mouth daily.      aspirin 81 MG tablet   Take 81 mg by mouth daily.      calcitRIOL 0.25 MCG capsule   Commonly known as: ROCALTROL   Take 0.25 mcg by mouth daily.      citalopram 40 MG tablet   Commonly known as: CELEXA   Take 0.5 tablets (20 mg total) by mouth daily.      ferrous sulfate 325 (65 FE) MG tablet   Take 325 mg by mouth daily with breakfast.      furosemide 40 MG tablet   Commonly known as: LASIX   Take 1 tablet (40 mg total) by mouth daily.      guaiFENesin 600 MG 12 hr tablet   Commonly known as: MUCINEX   Take 2  tablets (1,200 mg total) by mouth 2 (two) times daily.      insulin aspart protamine-insulin aspart (70-30) 100 UNIT/ML injection   Commonly known as: NOVOLOG 70/30   Inject 30 Units into the skin 2 (two) times daily with a meal.      levofloxacin 750 MG tablet   Commonly known as: LEVAQUIN   Take 1 tablet (750 mg total) by mouth every other day.      multivitamin tablet   Take 1 tablet by mouth daily.      NON FORMULARY   Oxygen 4LPM    24/7      SPIRIVA HANDIHALER 18 MCG inhalation capsule   Generic drug: tiotropium   INHALE ONE DOSE BY MOUTH EVERY DAY      SYMBICORT 160-4.5 MCG/ACT inhaler   Generic drug: budesonide-formoterol   INHALE TWO PUFFS BY MOUTH TWICE DAILY      triamcinolone cream 0.1 %   Commonly known as: KENALOG   Apply topically 2 (two) times daily.           Follow-up Information    Follow up with Romero Belling, MD in 4 weeks.   Contact information:   [address] [facility] [address] [place] [address] [phone]       Follow up with Sandrea Hughs, MD in 2 weeks.   Contact information:   [address] [facility] [address] [place] [address] [phone]           The results of significant  diagnostics from this hospitalization (including imaging, microbiology, ancillary and laboratory) are listed below for reference.    Significant Diagnostic Studies: Dg Chest 2 View 06/26/2011  *RADIOLOGY REPORT*  Clinical Data: Productive cough and shortness of breath.  CHEST - 2 VIEW  Comparison: Chest x-ray 06/23/2011.  Findings: There is an area of airspace consolidation within the right middle lobe, concerning for pneumonia.  Small irregular nodular opacities are again noted in the lung apices bilaterally (right greater than left) similar recent prior examinations.  No pleural effusions.  Pulmonary vasculature is normal.  Heart size is mildly enlarged (unchanged). The patient is rotated to the left on today's exam, resulting in distortion of the mediastinal contours and reduced diagnostic sensitivity and specificity for mediastinal pathology.  Atherosclerotic calcifications within the arch of the aorta.  IMPRESSION: 1.  Right middle lobe airspace consolidation concerning for right middle lobe pneumonia. 2.  Small bilateral apical nodules (right greater than left). These may simply represent areas of nodular scarring from prior infection, however, continued attention on future follow up studies is recommended to ensure stability over time. 3.  Mild cardiomegaly is unchanged. 4.  Atherosclerosis.  Original Report Authenticated By: Florencia Reasons, M.D.   Dg Chest 2 View 06/23/2011  *RADIOLOGY REPORT*  Clinical Data: Shortness of breath, chest pain  CHEST - 2 VIEW  Comparison: 06/20/2011 and 03/23/2011  Findings: Cardiomediastinal silhouette is stable.  No acute infiltrate or pleural effusion.  No pulmonary edema.  Stable bilateral upper lobe nodular scarring.  Bony thorax is stable.  IMPRESSION: No active disease.  No significant change.  Stable bilateral upper lobe nodular scarring.  Original Report Authenticated By: Natasha Mead, M.D.   Dg Chest Port 1 View 06/21/2011  *RADIOLOGY REPORT*  Clinical Data:  Shortness of breath  PORTABLE CHEST - 1 VIEW  Comparison: 03/23/2011  Findings: Increased right greater than left lung base opacities. Cardiomegaly.  Central vascular congestion. Mild interstitial prominence.  The  upper lobe nodular opacities are without significant interval change.  No acute osseous finding.  No pneumothorax.  IMPRESSION: Increased bibasilar opacity; atelectasis versus pneumonia.  Cardiomegaly with central vascular congestion.  Mild edema not excluded.  The known nodular opacities within the upper lobes are better characterized on comparison CT.  Original Report Authenticated By: Waneta Martins, M.D.    Microbiology: Recent Results (from the past 240 hour(s))  WOUND CULTURE     Status: Normal   Collection Time   06/23/11  7:54 AM      Component Value Range Status Comment   Specimen Description WOUND BACK   Final    Special Requests Immunocompromised   Final    Gram Stain     Final    Value: NO WBC SEEN     FEW SQUAMOUS EPITHELIAL CELLS PRESENT     RARE GRAM POSITIVE COCCI IN PAIRS     FEW GRAM NEGATIVE COCCOBACILLI   Culture     Final    Value: MULTIPLE ORGANISMS PRESENT, NONE PREDOMINANT     Note: NO STAPHYLOCOCCUS AUREUS ISOLATED NO GROUP A STREP (S.PYOGENES) ISOLATED   Report Status 06/26/2011 FINAL   Final   CULTURE, BLOOD (ROUTINE X 2)     Status: Normal (Preliminary result)   Collection Time   06/27/11 10:40 AM      Component Value Range Status Comment   Specimen Description BLOOD RIGHT ARM   Final    Special Requests     Final    Value: BOTTLES DRAWN AEROBIC AND ANAEROBIC 5CC BOTH BOTTLES   Culture  Setup Time [id number]   Final    Culture     Final    Value:        BLOOD CULTURE RECEIVED NO GROWTH TO DATE CULTURE WILL BE HELD FOR 5 DAYS BEFORE ISSUING A FINAL NEGATIVE REPORT   Report Status PENDING   Incomplete   CULTURE, BLOOD (ROUTINE X 2)     Status: Normal (Preliminary result)   Collection Time   06/27/11 10:45 AM      Component Value Range Status  Comment   Specimen Description BLOOD LEFT ARM   Final    Special Requests BOTTLES DRAWN AEROBIC ONLY 1CC   Final    Culture  Setup Time [id number]   Final    Culture     Final    Value:        BLOOD CULTURE RECEIVED NO GROWTH TO DATE CULTURE WILL BE HELD FOR 5 DAYS BEFORE ISSUING A FINAL NEGATIVE REPORT   Report Status PENDING   Incomplete   CULTURE, EXPECTORATED SPUTUM-ASSESSMENT     Status: Normal   Collection Time   06/27/11 12:26 PM      Component Value Range Status Comment   Specimen Description SPUTUM   Final    Special Requests Normal   Final    Sputum evaluation     Final    Value: THIS SPECIMEN IS ACCEPTABLE. RESPIRATORY CULTURE REPORT TO FOLLOW.   Report Status 06/27/2011 FINAL   Final   CULTURE, RESPIRATORY     Status: Normal   Collection Time   06/27/11 12:26 PM      Component Value Range Status Comment   Specimen Description SPUTUM   Final    Special Requests NONE   Final    Gram Stain     Final    Value: FEW WBC PRESENT,BOTH PMN AND MONONUCLEAR     FEW SQUAMOUS EPITHELIAL CELLS PRESENT  FEW GRAM POSITIVE COCCI IN PAIRS     RARE GRAM POSITIVE RODS   Culture NORMAL OROPHARYNGEAL FLORA   Final    Report Status 06/29/2011 FINAL   Final      Labs: Basic Metabolic Panel:  Lab 07/02/11 4098 07/01/11 0512 06/30/11 0435 06/29/11 0427 06/28/11 0445  NA 137 138 138 137 138  K 4.7 4.6 4.7 4.7 5.0  CL 96 95* 94* 95* 95*  CO2 35* 37* 36* 36* 39*  GLUCOSE 94 104* 91 88 124*  BUN 52* 49* 43* 46* 45*  CREATININE 1.74* 1.70* 1.56* 1.52* 1.45*  CALCIUM 9.6 9.2 9.7 8.8 9.2  MG -- -- -- -- --  PHOS -- -- -- -- --   CBC:  Lab 07/02/11 0426 07/01/11 0512 06/30/11 0435 06/29/11 0427 06/28/11 0445  WBC 11.2* 11.4* 14.0* 12.8* 14.1*  NEUTROABS -- -- -- -- --  HGB 10.1* 10.6* 11.5* 10.3* 10.9*  HCT 32.5* 33.8* 36.5 32.6* 34.6*  MCV 91.5 91.4 90.8 89.8 90.1  PLT 165 190 244 209 239    BNP (last 3 results)  Basename 06/26/11 0526 06/23/11 0538 06/21/11 0735  PROBNP  7258.0* 5844.0* 13733.0*   CBG:  Lab 07/02/11 1155 07/02/11 0805 07/01/11 2137 07/01/11 1648 07/01/11 1201  GLUCAP 115* 93 104* 172* 97    Time coordinating discharge: Over 30 minutes  Signed:  Conley Canal, MD  [facility] 07/02/2011, 12:37 PM  Pager [phone]  If 7PM-7AM, please contact night-coverage [email or web address] Password [email or web address] Please note that this discharge summary was an addendum to the comprehensive summary by Dr Izola Price on 06/29/11. There were no major changes other than ct chest/length of abx course, and disposition to snf, as opposed to home.

## 2011-06-29 NOTE — Progress Notes (Signed)
Physical Therapy Treatment Patient Details Name: [name] MRN: [id number] DOB: [birth year] Today's Date: 06/29/2011 Time: 4098-1191 PT Time Calculation (min): 14 min  PT Assessment / Plan / Recommendation Comments on Treatment Session  Pt continues to demonstrate poor activity tolerance. Would benefit from RW use for improved stability with ambulation-pt states she has access to RW. Pt plans to d/c home tomorrow    Follow Up Recommendations  Home health PT;Supervision - Intermittent    Barriers to Discharge        Equipment Recommendations  None recommended by PT    Recommendations for Other Services    Frequency Min 3X/week   Plan Discharge plan remains appropriate    Precautions / Restrictions Precautions Precautions: Fall Restrictions Weight Bearing Restrictions: No   Pertinent Vitals/Pain     Mobility  Bed Mobility Bed Mobility: Not assessed Transfers Transfers: Sit to Stand;Stand to Sit Sit to Stand: 4: Min guard;From bed;With upper extremity assist Stand to Sit: 4: Min guard;To bed;With upper extremity assist Details for Transfer Assistance: VCs safety.  Ambulation/Gait Ambulation/Gait Assistance: 4: Min assist Ambulation Distance (Feet): 12 Feet (x 2.) Ambulation/Gait Assistance Details: Pt declilned to ambulate in hallway. Assist to steady. Pt reaching out for objects (bedrail) to hold onto. Fatigues very easily. Seated rest break needed after 12 feet.  Gait Pattern: Step-through pattern;Decreased stride length    Exercises     PT Diagnosis:    PT Problem List:   PT Treatment Interventions:     PT Goals Acute Rehab PT Goals PT Goal: Sit to Stand - Progress: Progressing toward goal PT Goal: Stand to Sit - Progress: Progressing toward goal PT Goal: Ambulate - Progress: Progressing toward goal  Visit Information  Last PT Received On: 06/29/11 Assistance Needed: +1    Subjective Data  Subjective: "I'm tired" Patient Stated Goal: home tomorrow     Cognition  Overall Cognitive Status: Appears within functional limits for tasks assessed/performed Arousal/Alertness: Awake/alert Orientation Level: Appears intact for tasks assessed Behavior During Session: [facility] for tasks performed    Balance     End of Session PT - End of Session Activity Tolerance: Patient limited by fatigue Patient left: in bed;with call bell/phone within reach    [name] Alert [facility] 06/29/2011, 2:28 PM [phone]

## 2011-06-29 NOTE — Progress Notes (Signed)
Physical Therapy Note  Attempted PT tx session this am-pt declined to participate. Requested PT check back another time. Will check back as schedule permits. Thanks.   [name] Alert, PT [phone]

## 2011-06-29 NOTE — Discharge Instructions (Signed)

## 2011-06-30 ENCOUNTER — Inpatient Hospital Stay (HOSPITAL_COMMUNITY): Payer: Medicare Other

## 2011-06-30 DIAGNOSIS — I5023 Acute on chronic systolic (congestive) heart failure: Secondary | ICD-10-CM

## 2011-06-30 DIAGNOSIS — J9620 Acute and chronic respiratory failure, unspecified whether with hypoxia or hypercapnia: Secondary | ICD-10-CM

## 2011-06-30 DIAGNOSIS — J441 Chronic obstructive pulmonary disease with (acute) exacerbation: Secondary | ICD-10-CM

## 2011-06-30 DIAGNOSIS — D696 Thrombocytopenia, unspecified: Secondary | ICD-10-CM

## 2011-06-30 LAB — GLUCOSE, CAPILLARY
Glucose-Capillary: 80 mg/dL (ref 70–99)
Glucose-Capillary: 140 mg/dL — ABNORMAL HIGH (ref 70–99)
Glucose-Capillary: 275 mg/dL — ABNORMAL HIGH (ref 70–99)

## 2011-06-30 LAB — BASIC METABOLIC PANEL
CO2: 36 mEq/L — ABNORMAL HIGH (ref 19–32)
Calcium: 9.7 mg/dL (ref 8.4–10.5)
Glucose, Bld: 91 mg/dL (ref 70–99)
Sodium: 138 mEq/L (ref 135–145)

## 2011-06-30 LAB — CBC
Hemoglobin: 11.5 g/dL — ABNORMAL LOW (ref 12.0–15.0)
MCH: 28.6 pg (ref 26.0–34.0)
RBC: 4.02 MIL/uL (ref 3.87–5.11)

## 2011-06-30 MED ORDER — CITALOPRAM HYDROBROMIDE 20 MG PO TABS
20.0000 mg | ORAL_TABLET | Freq: Every day | ORAL | Status: DC
  Administered 2011-07-01 – 2011-07-02 (×2): 20 mg via ORAL
  Filled 2011-06-30 (×2): qty 1

## 2011-06-30 MED ORDER — FUROSEMIDE 10 MG/ML IJ SOLN
40.0000 mg | Freq: Once | INTRAMUSCULAR | Status: AC
  Administered 2011-06-30: 40 mg via INTRAVENOUS
  Filled 2011-06-30: qty 4

## 2011-06-30 MED ORDER — GUAIFENESIN ER 600 MG PO TB12
1200.0000 mg | ORAL_TABLET | Freq: Two times a day (BID) | ORAL | Status: DC
  Administered 2011-06-30 – 2011-07-02 (×4): 1200 mg via ORAL
  Filled 2011-06-30 (×5): qty 2

## 2011-06-30 NOTE — Progress Notes (Signed)
[name] is a 66 y.o. female admitted with sob/cough productive of greenish phlegm. I have reviewed her chart, seen and examined her at bed side. She continues to cough and have sob, and does not feel too good today. I have reviewed her cxrs and I am concerned she may have a postobstructive process.   1. Community acquired pneumonia   2. COPD exacerbation   3. Abdominal  pain, other specified site   4. Acute and chronic respiratory failure (acute-on-chronic)   5. Acute on chronic systolic heart failure     Past Medical History  Diagnosis Date  . RENAL INSUFFICIENCY 10/15/2008  . Edema 06/18/2008  . PNEUMONIA 12/01/2007  . CARPAL TUNNEL SYNDROME, BILATERAL 10/30/2007  . HAND PAIN, LEFT 09/04/2007  . Morbid obesity   . ANXIETY 11/08/2006  . HYPERTENSION 11/08/2006  . DIABETES MELLITUS, TYPE II 11/08/2006  . HYPERCHOLESTEROLEMIA 07/24/2009  . ANEMIA 07/24/2009  . COPD 11/08/2006    -HFA 50% p coaching 10/15/2009  . RESPIRATORY FAILURE, CHRONIC 10/15/2009  . BACK PAIN, LUMBAR 03/07/2009  . CHEST PAIN 07/24/2009  . Restless leg syndrome   . Systolic CHF, acute 03/24/2011  . Ischemic cardiomyopathy 03/26/2011   Current Facility-Administered Medications  Medication Dose Route Frequency Provider Last Rate Last Dose  . acetaminophen (TYLENOL) tablet 650 mg  650 mg Oral Q6H PRN Massie Maroon, MD   650 mg at 06/21/11 2355   Or  . acetaminophen (TYLENOL) suppository 650 mg  650 mg Rectal Q6H PRN Massie Maroon, MD      . ALPRAZolam Prudy Feeler) tablet 0.5 mg  0.5 mg Oral TID PRN Dorothea Ogle, MD   0.5 mg at 06/30/11 1146  . amLODipine (NORVASC) tablet 5 mg  5 mg Oral Daily Massie Maroon, MD   5 mg at 06/30/11 0919  . aspirin EC tablet 81 mg  81 mg Oral Daily Massie Maroon, MD   81 mg at 06/30/11 0919  . budesonide-formoterol (SYMBICORT) 160-4.5 MCG/ACT inhaler 2 puff  2 puff Inhalation BID Massie Maroon, MD   2 puff at 06/30/11 0751  . calcitRIOL (ROCALTROL) capsule 0.25 mcg  0.25 mcg Oral Daily Massie Maroon, MD   0.25 mcg at 06/30/11 [phone]  . citalopram (CELEXA) tablet 40 mg  40 mg Oral Daily Massie Maroon, MD   40 mg at 06/30/11 0919  . enoxaparin (LOVENOX) injection 60 mg  60 mg Subcutaneous Q24H Gwen Her, PHARMD   60 mg at 06/29/11 1100  . ferrous sulfate tablet 325 mg  325 mg Oral Q breakfast Massie Maroon, MD   325 mg at 06/30/11 [phone]  . furosemide (LASIX) injection 40 mg  40 mg Intravenous Once Sriram Febles, MD      . furosemide (LASIX) tablet 40 mg  40 mg Oral Daily Dorothea Ogle, MD   40 mg at 06/30/11 0919  . guaiFENesin (MUCINEX) 12 hr tablet 1,200 mg  1,200 mg Oral BID Catrina Fellenz, MD      . insulin aspart (novoLOG) injection 0-15 Units  0-15 Units Subcutaneous TID WC Ripudeep Jenna Luo, MD   2 Units at 06/30/11 1342  . insulin aspart (novoLOG) injection 0-5 Units  0-5 Units Subcutaneous QHS Massie Maroon, MD   2 Units at 06/26/11 2232  . insulin aspart protamine-insulin aspart (NOVOLOG 70/30) injection 50 Units  50 Units Subcutaneous BID WC Gwen Her, PHARMD   50 Units at 06/30/11 0800  . ipratropium (ATROVENT) nebulizer solution  0.5 mg  0.5 mg Nebulization Q6H Ripudeep K Rai, MD   0.5 mg at 06/30/11 1432  . levalbuterol (XOPENEX) nebulizer solution 0.63 mg  0.63 mg Nebulization Q6H Ripudeep K Rai, MD   0.63 mg at 06/30/11 1432  . levalbuterol (XOPENEX) nebulizer solution 0.63 mg  0.63 mg Nebulization Q2H PRN Ripudeep Jenna Luo, MD   0.63 mg at 06/24/11 1834  . levofloxacin (LEVAQUIN) tablet 750 mg  750 mg Oral Daily Dorothea Ogle, MD   750 mg at 06/30/11 0919  . multivitamin with minerals tablet 1 tablet  1 tablet Oral Daily Massie Maroon, MD   1 tablet at 06/30/11 0919  . predniSONE (DELTASONE) tablet 10 mg  10 mg Oral Q breakfast Dorothea Ogle, MD   10 mg at 06/30/11 0919  . sodium chloride (OCEAN) 0.65 % nasal spray 1 spray  1 spray Each Nare PRN Caroline More, NP   1 spray at 06/23/11 2153  . sodium chloride 0.9 % injection 3 mL  3 mL Intravenous Q12H Massie Maroon, MD   3  mL at 06/30/11 1000  . DISCONTD: predniSONE (DELTASONE) tablet 30 mg  30 mg Oral Q breakfast Dorothea Ogle, MD   30 mg at 06/29/11 0809   Allergies  Allergen Reactions  . Pioglitazone     REACTION: edema  . Rosiglitazone Maleate Nausea And Vomiting   Principal Problem:  *Respiratory failure, acute-on-chronic Active Problems:  Chronic systolic congestive heart failure  CAD (coronary artery disease)  Healthcare-associated pneumonia  COPD with acute exacerbation  Systolic CHF, acute on chronic  Diabetes mellitus  Acute renal insufficiency   Vital signs in last 24 hours: Temp:  [97.6 F (36.4 C)-98.1 F (36.7 C)] 98.1 F (36.7 C) (06/19 1340) Pulse Rate:  [77-94] 94  (06/19 1340) Resp:  [18-19] 19  (06/19 0529) BP: (136-145)/(59-80) 136/80 mmHg (06/19 1340) SpO2:  [97 %-99 %] 98 % (06/19 1432) Weight change:  Last BM Date: 06/29/11  Intake/Output from previous day: 06/18 0701 - 06/19 0700 In: 1380 [P.O.:1380] Out: 2600 [Urine:2600] Intake/Output this shift:    Lab Results:  Basename 06/30/11 0435 06/29/11 0427  WBC 14.0* 12.8*  HGB 11.5* 10.3*  HCT 36.5 32.6*  PLT 244 209   BMET  Basename 06/30/11 0435 06/29/11 0427  NA 138 137  K 4.7 4.7  CL 94* 95*  CO2 36* 36*  GLUCOSE 91 88  BUN 43* 46*  CREATININE 1.56* 1.52*  CALCIUM 9.7 8.8    Studies/Results: No results found.  Medications: I have reviewed the patient's current medications.   Physical exam GENERAL- alert HEAD- normal atraumatic, no neck masses, normal thyroid, no jvd RESPIRATORY- appears well, vitals normal, no respiratory distress, acyanotic, normal RR, ear and throat exam is normal, neck free of mass or lymphadenopathy, chest clear, no wheezing, crepitations, rhonchi, normal symmetric air entry CVS- regular rate and rhythm, S1, S2 normal, no murmur, click, rub or gallop ABDOMEN- abdomen is soft without significant tenderness, masses, organomegaly or guarding NEURO- Grossly  normal EXTREMITIES- extremities normal, atraumatic, no cyanosis or edema  Plan      * Respiratoryfailure,acute-on-chronic/Healthcare-associated pneumonia/ COPD with acute exacerbation - combination of factors. Concern for postobstructive process. Obtain ct chest without contrast(high creatinine). Continue current abx/steroids. * Chronic systolic congestive heart failure- dose of iv lasix. *  CAD (coronary artery disease)- stable *  Diabetes mellitus- controlled. *  Acute renal insufficiency- stable, monitor. dvt prophylaxis.    Trinity Hyland 06/30/2011 4:28 PM Pager:  [phone].    

## 2011-07-01 DIAGNOSIS — J9620 Acute and chronic respiratory failure, unspecified whether with hypoxia or hypercapnia: Secondary | ICD-10-CM

## 2011-07-01 DIAGNOSIS — J441 Chronic obstructive pulmonary disease with (acute) exacerbation: Secondary | ICD-10-CM

## 2011-07-01 DIAGNOSIS — I5023 Acute on chronic systolic (congestive) heart failure: Secondary | ICD-10-CM

## 2011-07-01 DIAGNOSIS — D696 Thrombocytopenia, unspecified: Secondary | ICD-10-CM

## 2011-07-01 LAB — CBC
Hemoglobin: 10.6 g/dL — ABNORMAL LOW (ref 12.0–15.0)
MCH: 28.6 pg (ref 26.0–34.0)
RBC: 3.7 MIL/uL — ABNORMAL LOW (ref 3.87–5.11)
WBC: 11.4 10*3/uL — ABNORMAL HIGH (ref 4.0–10.5)

## 2011-07-01 LAB — GLUCOSE, CAPILLARY: Glucose-Capillary: 183 mg/dL — ABNORMAL HIGH (ref 70–99)

## 2011-07-01 LAB — COMPREHENSIVE METABOLIC PANEL
ALT: 23 U/L (ref 0–35)
Alkaline Phosphatase: 30 U/L — ABNORMAL LOW (ref 39–117)
CO2: 37 mEq/L — ABNORMAL HIGH (ref 19–32)
Chloride: 95 mEq/L — ABNORMAL LOW (ref 96–112)
GFR calc Af Amer: 35 mL/min — ABNORMAL LOW (ref >90)
GFR calc non Af Amer: 30 mL/min — ABNORMAL LOW (ref >90)
Glucose, Bld: 104 mg/dL — ABNORMAL HIGH (ref 70–99)
Potassium: 4.6 mEq/L (ref 3.5–5.1)
Sodium: 138 mEq/L (ref 135–145)

## 2011-07-01 MED ORDER — LEVOFLOXACIN 750 MG PO TABS: 750.0000 mg | ORAL_TABLET | ORAL | Status: DC

## 2011-07-01 NOTE — Progress Notes (Signed)
Physical Therapy Treatment Patient Details Name: [name] MRN: [id number] DOB: 10/19/1945 Today's Date: 07/01/2011 Time: 0955-1010 PT Time Calculation (min): 15 min  PT Assessment / Plan / Recommendation Comments on Treatment Session  Pt progressing better.  Amb in hallway twice.  Pt plans to D/C back home     Follow Up Recommendations  Home health PT;Supervision - Intermittent             Frequency Min 3X/week   Plan Discharge plan remains appropriate    Precautions / Restrictions Precautions Precautions: Fall Precaution Comments: home O2 4lts prior Restrictions Weight Bearing Restrictions: No    Pertinent Vitals/Pain No c/o pain    Mobility  Bed Mobility Bed Mobility: Not assessed Details for Bed Mobility Assistance: pt sitting EOB on arrival Transfers Transfers: Sit to Stand;Stand to Sit Sit to Stand: 5: Supervision;From bed Stand to Sit: 5: Supervision;To bed Details for Transfer Assistance: good use of hands and safety cognition Ambulation/Gait Ambulation/Gait Assistance: 4: Min guard Ambulation Distance (Feet): 100 Feet (25', 75') Assistive device: Rolling walker Ambulation/Gait Assistance Details: +2 assist for safety and equipment.  Monitoring O2 sats on 4 lts.  First amb pt desated to 77% which required a sitting rest break and purse lip breathing.  Second amb sats maintained @ 88-90%.  Pr demon limited activity tolerance but is aware when she needs to rest. Gait Pattern: Step-through pattern;Decreased stride length;Shuffle Stairs: No Wheelchair Mobility Wheelchair Mobility: No     PT Goals   progressing    Visit Information  Last PT Received On: 07/01/11 Assistance Needed: +1    Subjective Data      Cognition    good   Balance   good with RW  End of Session PT - End of Session Equipment Utilized During Treatment: Gait belt;Oxygen (4 lts) Activity Tolerance: Patient limited by fatigue Patient left: in bed;with call bell/phone within  reach   Felecia Shelling  PTA [facility]  Acute  Rehab Pager     [phone]

## 2011-07-01 NOTE — Progress Notes (Signed)
Spoke with patient at beside regarding discharge.  She is not confident in her ability to self care. Her daughter has not been able to consistently assist her at home.  Currently her son resides in [place], [place] and due to distance is only able to provide intermittent assist.  In light of this her [facility] community support team would like SNF short term rehabilitation to be considered.  [facility] Care Management will continue to explore applicable support options.  For any additional questions or new referrals please contact Anibal Henderson BSN RN [facility] Liaison at [phone].

## 2011-07-01 NOTE — Progress Notes (Addendum)
  Pharmacy Note- Renal Levaquin Dose Adjustment  Pt on Day #5 Levaquin 750mg  PO q24h for HAP. Scr continues to worsen (1.7 today), CrCl now <15ml/min. Will renally adjust Levaquin to q48h.  Suggest making this dose change at time of discharge.  Darrol Angel, PharmD Pager: [phone] 07/01/2011 10:56 AM

## 2011-07-01 NOTE — Progress Notes (Signed)
Discussed ct chest result with Ms Salem. Also discussed with Jorja Loa and Bonita Quin regarding disposition. Patient will not be able to manage by herself at home due to occasional hypoxia. Agree with consideration for snf. SUBJECTIVE Feels somewhat better today.   1. Community acquired pneumonia   2. COPD exacerbation   3. Abdominal  pain, other specified site   4. Acute and chronic respiratory failure (acute-on-chronic)   5. Acute on chronic systolic heart failure     Past Medical History  Diagnosis Date  . RENAL INSUFFICIENCY 10/15/2008  . Edema 06/18/2008  . PNEUMONIA 12/01/2007  . CARPAL TUNNEL SYNDROME, BILATERAL 10/30/2007  . HAND PAIN, LEFT 09/04/2007  . Morbid obesity   . ANXIETY 11/08/2006  . HYPERTENSION 11/08/2006  . DIABETES MELLITUS, TYPE II 11/08/2006  . HYPERCHOLESTEROLEMIA 07/24/2009  . ANEMIA 07/24/2009  . COPD 11/08/2006    -HFA 50% p coaching 10/15/2009  . RESPIRATORY FAILURE, CHRONIC 10/15/2009  . BACK PAIN, LUMBAR 03/07/2009  . CHEST PAIN 07/24/2009  . Restless leg syndrome   . Systolic CHF, acute 03/24/2011  . Ischemic cardiomyopathy 03/26/2011   Current Facility-Administered Medications  Medication Dose Route Frequency Provider Last Rate Last Dose  . acetaminophen (TYLENOL) tablet 650 mg  650 mg Oral Q6H PRN Massie Maroon, MD   650 mg at 06/21/11 2355   Or  . acetaminophen (TYLENOL) suppository 650 mg  650 mg Rectal Q6H PRN Massie Maroon, MD      . ALPRAZolam Prudy Feeler) tablet 0.5 mg  0.5 mg Oral TID PRN Dorothea Ogle, MD   0.5 mg at 06/30/11 1958  . amLODipine (NORVASC) tablet 5 mg  5 mg Oral Daily Massie Maroon, MD   5 mg at 07/01/11 0933  . aspirin EC tablet 81 mg  81 mg Oral Daily Massie Maroon, MD   81 mg at 07/01/11 0933  . budesonide-formoterol (SYMBICORT) 160-4.5 MCG/ACT inhaler 2 puff  2 puff Inhalation BID Massie Maroon, MD   2 puff at 07/01/11 0750  . calcitRIOL (ROCALTROL) capsule 0.25 mcg  0.25 mcg Oral Daily Massie Maroon, MD   0.25 mcg at 07/01/11 0934  .  citalopram (CELEXA) tablet 20 mg  20 mg Oral Daily Odessa Morren, MD   20 mg at 07/01/11 0933  . enoxaparin (LOVENOX) injection 60 mg  60 mg Subcutaneous Q24H Gwen Her, PHARMD   60 mg at 07/01/11 0933  . ferrous sulfate tablet 325 mg  325 mg Oral Q breakfast Massie Maroon, MD   325 mg at 07/01/11 0933  . furosemide (LASIX) injection 40 mg  40 mg Intravenous Once Chandra Asher, MD   40 mg at 06/30/11 1723  . furosemide (LASIX) tablet 40 mg  40 mg Oral Daily Dorothea Ogle, MD   40 mg at 07/01/11 0933  . guaiFENesin (MUCINEX) 12 hr tablet 1,200 mg  1,200 mg Oral BID Janayia Burggraf, MD   1,200 mg at 07/01/11 0934  . insulin aspart (novoLOG) injection 0-15 Units  0-15 Units Subcutaneous TID WC Ripudeep Jenna Luo, MD   8 Units at 06/30/11 1810  . insulin aspart (novoLOG) injection 0-5 Units  0-5 Units Subcutaneous QHS Massie Maroon, MD   2 Units at 06/26/11 2232  . insulin aspart protamine-insulin aspart (NOVOLOG 70/30) injection 50 Units  50 Units Subcutaneous BID WC Gwen Her, PHARMD   50 Units at 07/01/11 2130  . ipratropium (ATROVENT) nebulizer solution 0.5 mg  0.5 mg Nebulization Q6H Ripudeep Jenna Luo, MD  0.5 mg at 07/01/11 1541  . levalbuterol (XOPENEX) nebulizer solution 0.63 mg  0.63 mg Nebulization Q6H Ripudeep K Rai, MD   0.63 mg at 07/01/11 1542  . levalbuterol (XOPENEX) nebulizer solution 0.63 mg  0.63 mg Nebulization Q2H PRN Ripudeep Jenna Luo, MD   0.63 mg at 06/24/11 1834  . levofloxacin (LEVAQUIN) tablet 750 mg  750 mg Oral Q48H Annia Belt, [place]      . multivitamin with minerals tablet 1 tablet  1 tablet Oral Daily Massie Maroon, MD   1 tablet at 07/01/11 [phone]  . predniSONE (DELTASONE) tablet 10 mg  10 mg Oral Q breakfast Dorothea Ogle, MD   10 mg at 07/01/11 1191  . sodium chloride (OCEAN) 0.65 % nasal spray 1 spray  1 spray Each Nare PRN Caroline More, NP   1 spray at 06/23/11 2153  . sodium chloride 0.9 % injection 3 mL  3 mL Intravenous Q12H Massie Maroon, MD   3 mL at  07/01/11 [phone]  . DISCONTD: levofloxacin (LEVAQUIN) tablet 750 mg  750 mg Oral Daily Dorothea Ogle, MD   750 mg at 07/01/11 0934   Allergies  Allergen Reactions  . Pioglitazone     REACTION: edema  . Rosiglitazone Maleate Nausea And Vomiting   Principal Problem:  *Respiratory failure, acute-on-chronic Active Problems:  Chronic systolic congestive heart failure  CAD (coronary artery disease)  Healthcare-associated pneumonia  COPD with acute exacerbation  Systolic CHF, acute on chronic  Diabetes mellitus  Acute renal insufficiency   Vital signs in last 24 hours: Temp:  [97.3 F (36.3 C)-98.1 F (36.7 C)] 98.1 F (36.7 C) (06/20 1340) Pulse Rate:  [79-98] 98  (06/20 1340) Resp:  [18-20] 20  (06/20 1340) BP: (127-136)/(67-77) 135/67 mmHg (06/20 1340) SpO2:  [95 %-98 %] 95 % (06/20 1340) Weight:  [118.389 kg (261 lb)] 118.389 kg (261 lb) (06/20 0513) Weight change:  Last BM Date: 06/29/11  Intake/Output from previous day: 06/19 0701 - 06/20 0700 In: 4 [IV Piggyback:4] Out: 4701 [Urine:4700; Stool:1] Intake/Output this shift: Total I/O In: 960 [P.O.:960] Out: 1400 [Urine:1400]  Lab Results:  [facility] 07/01/11 0512 06/30/11 0435  WBC 11.4* 14.0*  HGB 10.6* 11.5*  HCT 33.8* 36.5  PLT 190 244   BMET  Basename 07/01/11 0512 06/30/11 0435  NA 138 138  K 4.6 4.7  CL 95* 94*  CO2 37* 36*  GLUCOSE 104* 91  BUN 49* 43*  CREATININE 1.70* 1.56*  CALCIUM 9.2 9.7    Studies/Results: Ct Chest Wo Contrast  06/30/2011  *RADIOLOGY REPORT*  Clinical Data: Evaluate for lung mass; shortness of breath.  CT CHEST WITHOUT CONTRAST  Technique:  Multidetector CT imaging of the chest was performed following the standard protocol without IV contrast.  Comparison: CT of the chest performed 09/29/2010, and chest radiograph performed 06/26/2011  Findings: Since the prior study, there has been interval enlargement of a small nodular density at the right lung apex (image 12 of 61).  This  now measures approximately 7 mm in size, with minimal spiculation.  Given underlying chronic infection, this may be infectious or inflammatory in nature, but malignancy cannot be excluded.  This is at the borderline of detectability on PET, though PET would not readily distinguish between malignancy and infection.  Short-term CT follow-up in 2-3 months is recommended, given the apparent rate of growth; if the nodule continues to enlarge, PET/CT or biopsy would be recommended for further evaluation.  The adjacent irregular  spiculated opacity in the right upper lobe is grossly unchanged from multiple prior studies.  Mild additional patchy airspace opacities are noted in the periphery of both lungs, likely reflecting a chronic infectious process; these demonstrate interval redistribution since the prior study.  No additional suspicious nodular densities are identified.  No pleural effusion or pneumothorax is identified.  The mediastinum is unremarkable in appearance.  Scattered mediastinal nodes remain normal in size.  No pericardial effusion is identified.  The great vessels are unremarkable in appearance, aside from scattered calcific atherosclerotic disease.  The visualized portions of the thyroid gland are unremarkable in appearance.  No axillary lymphadenopathy is seen.  The visualized portions of the liver and spleen are unremarkable in appearance.  No acute osseous abnormalities are identified.  IMPRESSION:  1.  Interval enlargement of 7 mm nodule at the right lung apex, with minimal spiculation.  Given underlying chronic infection, this may be infectious or inflammatory in nature, but malignancy cannot be excluded.  Its size is at the borderline of detectability on PET, though PET would not readily distinguish between malignancy and infection.  Recommend short-term follow-up CT of the chest in 2-3 months, given the apparent rate of growth.  If the nodule continues to enlarge, PET/CT or biopsy would be  recommended for further evaluation. 2.  Stable adjacent irregular spiculated opacity in the right upper lobe, with mild redistributed patchy airspace opacities in the periphery of both lungs, likely reflecting a chronic infectious process.  Original Report Authenticated By: Tonia Ghent, M.D.    Medications: I have reviewed the patient's current medications.   Physical exam GENERAL- alert HEAD- normal atraumatic, no neck masses, normal thyroid, no jvd RESPIRATORY- appears well, vitals normal, no respiratory distress, acyanotic, normal RR, ear and throat exam is normal, neck free of mass or lymphadenopathy, chest clear, no wheezing, crepitations, rhonchi, normal symmetric air entry CVS- regular rate and rhythm, S1, S2 normal, no murmur, click, rub or gallop ABDOMEN- abdomen is soft without significant tenderness, masses, organomegaly or guarding NEURO- Grossly normal EXTREMITIES- extremities normal, atraumatic, no cyanosis or edema  Plan  * Respiratoryfailure,acute-on-chronic/Healthcare-associated pneumonia/ COPD with acute exacerbation - combination of factors. Ct chest unrevealing for new pathology other than slightly larger nodule which needs follow up in 2-3 months. Continue current mx. * Chronic systolic congestive heart failure- stable. Continue. * CAD (coronary artery disease)- stable  * Diabetes mellitus- controlled.  * Acute renal insufficiency- stable, monitor.  dvt prophylaxis.      Jahson Emanuele 07/01/2011 4:40 PM Pager: [phone].

## 2011-07-01 NOTE — Progress Notes (Signed)
07/01/2011 Raynelle Bring BSN CCM [phone] CM advised by Case Manager -[facility] that patient is active with their services. States pt is interested in ST SNF.  Referral to CSW.

## 2011-07-02 DIAGNOSIS — I5023 Acute on chronic systolic (congestive) heart failure: Secondary | ICD-10-CM

## 2011-07-02 DIAGNOSIS — J9620 Acute and chronic respiratory failure, unspecified whether with hypoxia or hypercapnia: Secondary | ICD-10-CM

## 2011-07-02 DIAGNOSIS — J441 Chronic obstructive pulmonary disease with (acute) exacerbation: Secondary | ICD-10-CM

## 2011-07-02 DIAGNOSIS — D696 Thrombocytopenia, unspecified: Secondary | ICD-10-CM

## 2011-07-02 LAB — BASIC METABOLIC PANEL
BUN: 52 mg/dL — ABNORMAL HIGH (ref 6–23)
CO2: 35 mEq/L — ABNORMAL HIGH (ref 19–32)
Chloride: 96 mEq/L (ref 96–112)
GFR calc non Af Amer: 29 mL/min — ABNORMAL LOW (ref >90)
Glucose, Bld: 94 mg/dL (ref 70–99)
Potassium: 4.7 mEq/L (ref 3.5–5.1)
Sodium: 137 mEq/L (ref 135–145)

## 2011-07-02 LAB — CBC
HCT: 32.5 % — ABNORMAL LOW (ref 36.0–46.0)
Hemoglobin: 10.1 g/dL — ABNORMAL LOW (ref 12.0–15.0)
RBC: 3.55 MIL/uL — ABNORMAL LOW (ref 3.87–5.11)

## 2011-07-02 LAB — GLUCOSE, CAPILLARY
Glucose-Capillary: 104 mg/dL — ABNORMAL HIGH (ref 70–99)
Glucose-Capillary: 93 mg/dL (ref 70–99)
Glucose-Capillary: 115 mg/dL — ABNORMAL HIGH (ref 70–99)

## 2011-07-02 MED ORDER — LEVOFLOXACIN 750 MG PO TABS: 750.0000 mg | ORAL_TABLET | ORAL | Status: AC

## 2011-07-02 MED ORDER — GUAIFENESIN ER 600 MG PO TB12: 1200.0000 mg | ORAL_TABLET | Freq: Two times a day (BID) | ORAL | Status: DC

## 2011-07-02 MED ORDER — CITALOPRAM HYDROBROMIDE 40 MG PO TABS: 20.0000 mg | ORAL_TABLET | Freq: Every day | ORAL | Status: DC

## 2011-07-02 MED ORDER — FUROSEMIDE 40 MG PO TABS: 40.0000 mg | ORAL_TABLET | Freq: Every day | ORAL | Status: DC

## 2011-07-02 NOTE — Progress Notes (Signed)
Patient cleared for discharge. Packet copied and placed in [place]. Patient alert and oriented and requesting [facility] [facility]. ptar called for transportaiton.  Chayce Rullo C. Kaylen Nghiem MSW, LCSW [phone]

## 2011-07-03 LAB — CULTURE, BLOOD (ROUTINE X 2): Culture: NO GROWTH

## 2011-07-06 ENCOUNTER — Encounter: Payer: Medicare Other | Admitting: Internal Medicine

## 2011-07-06 NOTE — Progress Notes (Signed)
 This encounter was created in error - please disregard.

## 2011-07-07 ENCOUNTER — Ambulatory Visit (HOSPITAL_COMMUNITY): Payer: Medicare Other

## 2011-07-24 ENCOUNTER — Other Ambulatory Visit: Payer: Self-pay | Admitting: Endocrinology

## 2011-07-26 NOTE — Telephone Encounter (Signed)
yes

## 2011-07-26 NOTE — Telephone Encounter (Signed)
Faxed script back to [facility].. 07/26/11@1 :36pm/LMB

## 2011-07-26 NOTE — Telephone Encounter (Signed)
MD out of office. Is this ok to refill?... 07/26/11@10 :17am/LMB

## 2011-08-02 ENCOUNTER — Telehealth: Payer: Self-pay

## 2011-08-02 NOTE — Telephone Encounter (Signed)
ok 

## 2011-08-02 NOTE — Telephone Encounter (Signed)
Caller states pt was in hospital until 06/09 -07/14 and Home Health Services were resumed 07/17. [name] is requesting order to resume and recertify her care, please advise.

## 2011-08-02 NOTE — Telephone Encounter (Signed)
HHRN informed of verbal order.  

## 2011-08-03 ENCOUNTER — Ambulatory Visit: Payer: Medicare Other | Admitting: Endocrinology

## 2011-08-06 ENCOUNTER — Ambulatory Visit (HOSPITAL_COMMUNITY): Payer: Medicare Other | Attending: Internal Medicine

## 2011-08-11 ENCOUNTER — Ambulatory Visit: Payer: Medicare Other | Admitting: Internal Medicine

## 2011-08-13 DIAGNOSIS — J441 Chronic obstructive pulmonary disease with (acute) exacerbation: Secondary | ICD-10-CM

## 2011-08-13 DIAGNOSIS — E1142 Type 2 diabetes mellitus with diabetic polyneuropathy: Secondary | ICD-10-CM

## 2011-08-13 DIAGNOSIS — I5023 Acute on chronic systolic (congestive) heart failure: Secondary | ICD-10-CM

## 2011-08-13 DIAGNOSIS — E1149 Type 2 diabetes mellitus with other diabetic neurological complication: Secondary | ICD-10-CM

## 2011-08-17 ENCOUNTER — Ambulatory Visit: Payer: Medicare Other | Admitting: Endocrinology

## 2011-09-02 ENCOUNTER — Telehealth: Payer: Self-pay | Admitting: Endocrinology

## 2011-09-02 NOTE — Telephone Encounter (Signed)
Diannia Ruder, RN, [place], [phone], calling about [name], [place] 1945/12/22, RN went by home on 8-21, Pt's power was out, Pt has COPD, O2 dependent, Power is scheduled to come back on 8-23.  RN would like Social Work order.  RN denies emergent symptoms

## 2011-09-14 ENCOUNTER — Ambulatory Visit: Payer: Medicare Other | Admitting: Internal Medicine

## 2011-09-14 ENCOUNTER — Ambulatory Visit: Payer: Medicare Other | Admitting: Endocrinology

## 2011-09-16 ENCOUNTER — Ambulatory Visit: Payer: Medicare Other | Admitting: Endocrinology

## 2011-09-16 DIAGNOSIS — Z0289 Encounter for other administrative examinations: Secondary | ICD-10-CM

## 2011-09-19 ENCOUNTER — Other Ambulatory Visit: Payer: Self-pay | Admitting: Endocrinology

## 2011-09-28 ENCOUNTER — Encounter: Payer: Self-pay | Admitting: Endocrinology

## 2011-09-30 ENCOUNTER — Ambulatory Visit: Payer: Medicare Other | Admitting: Endocrinology

## 2011-10-01 ENCOUNTER — Ambulatory Visit (INDEPENDENT_AMBULATORY_CARE_PROVIDER_SITE_OTHER): Payer: Medicare Other | Admitting: Endocrinology

## 2011-10-01 ENCOUNTER — Other Ambulatory Visit (INDEPENDENT_AMBULATORY_CARE_PROVIDER_SITE_OTHER): Payer: Medicare Other

## 2011-10-01 ENCOUNTER — Encounter: Payer: Self-pay | Admitting: Endocrinology

## 2011-10-01 VITALS — BP 136/80 | HR 116 | Temp 97.8°F | Resp 17 | Wt 273.8 lb

## 2011-10-01 DIAGNOSIS — N289 Disorder of kidney and ureter, unspecified: Secondary | ICD-10-CM

## 2011-10-01 DIAGNOSIS — E119 Type 2 diabetes mellitus without complications: Secondary | ICD-10-CM

## 2011-10-01 LAB — BASIC METABOLIC PANEL
Calcium: 9.1 mg/dL (ref 8.4–10.5)
GFR: 37.16 mL/min — ABNORMAL LOW (ref >60.00)
Glucose, Bld: 157 mg/dL — ABNORMAL HIGH (ref 70–99)
Potassium: 4.1 mEq/L (ref 3.5–5.1)
Sodium: 137 mEq/L (ref 135–145)

## 2011-10-01 LAB — HEMOGLOBIN A1C: Hgb A1c MFr Bld: 7.6 % — ABNORMAL HIGH (ref 4.6–6.5)

## 2011-10-01 NOTE — Patient Instructions (Addendum)
blood tests are being requested for you today.  You will receive a letter with results. Please continue the same medications. Please see dr wert as scheduled.

## 2011-10-01 NOTE — Progress Notes (Signed)
Subjective:    Patient ID: [name], female    DOB: 10-05-1945, 66 y.o.   MRN: [id number]  HPI Pt was recently in the hospital with respiratory failure.  Sxs are improved, but she still has doe with light exertion. Cozaar was stopped due to renal insuff in the hospital. Pt returns for f/u of type 2 DM (dx'ed, complicated by CAD, renal insufficiency, and peripheral sensory neuropathy).  no cbg record, but states cbg's are in the mid-100's.  It was higher when she was on the steroids. Past Medical History  Diagnosis Date  . RENAL INSUFFICIENCY 10/15/2008  . Edema 06/18/2008  . PNEUMONIA 12/01/2007  . CARPAL TUNNEL SYNDROME, BILATERAL 10/30/2007  . HAND PAIN, LEFT 09/04/2007  . Morbid obesity   . ANXIETY 11/08/2006  . HYPERTENSION 11/08/2006  . DIABETES MELLITUS, TYPE II 11/08/2006  . HYPERCHOLESTEROLEMIA 07/24/2009  . ANEMIA 07/24/2009  . COPD 11/08/2006    -HFA 50% p coaching 10/15/2009  . RESPIRATORY FAILURE, CHRONIC 10/15/2009  . BACK PAIN, LUMBAR 03/07/2009  . CHEST PAIN 07/24/2009  . Restless leg syndrome   . Systolic CHF, acute 03/24/2011  . Ischemic cardiomyopathy 03/26/2011    Past Surgical History  Procedure Date  . Abdominal hysterectomy 1987    History   Social History  . Marital Status: Widowed    Spouse Name: N/A    Number of Children: 2  . Years of Education: N/A   Occupational History  . 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 Smoking status: Former Smoker -- 2.0 packs/day for 40 years    Quit date: 01/12/2008  . Smokeless tobacco: Not on file  . Alcohol Use: No  . Drug Use: No  . Sexually Active: Not on file   Other Topics Concern  . Not on file   Social History Narrative   Divorced. Lives with dtr.    Current Outpatient Prescriptions on File Prior to Visit  Medication Sig Dispense Refill  . albuterol (PROVENTIL HFA;VENTOLIN HFA) 108 (90 BASE) MCG/ACT inhaler Inhale 2 puffs into the lungs every 6 (six) hours as  needed. For wheeze or shortness of breath      . albuterol (PROVENTIL) (2.5 MG/3ML) 0.083% nebulizer solution USE ONE VIAL IN NEBULIZER 4 TIMES DAILY OR EVERY 6 HOURS AS NEEDED  75 mL  4  . ALPRAZolam (XANAX) 0.25 MG tablet TAKE ONE TABLET BY MOUTH TWICE DAILY AS NEEDED FOR ANXIETY. DO NOT EXCEED TWO PER DAY  30 tablet  0  . amLODipine (NORVASC) 5 MG tablet Take 1 tablet (5 mg total) by mouth daily.  30 tablet  3  . aspirin 81 MG tablet Take 81 mg by mouth daily.       . calcitRIOL (ROCALTROL) 0.25 MCG capsule Take 0.25 mcg by mouth daily.        . citalopram (CELEXA) 40 MG tablet Take 0.5 tablets (20 mg total) by mouth daily.  15 tablet  2  . ferrous sulfate 325 (65 FE) MG tablet Take 325 mg by mouth daily with breakfast.      . furosemide (LASIX) 40 MG tablet Take 1 tablet (40 mg total) by mouth daily.  60 tablet  3  . guaiFENesin (MUCINEX) 600 MG 12 hr tablet Take 2 tablets (1,200 mg total) by mouth 2 (two) times daily.  6 tablet  0  . insulin aspart protamine-insulin aspart (NOVOLOG 70/30) (70-30) 100 UNIT/ML injection Inject 30 Units into the skin 2 (  two) times daily with a meal.  60 mL  1  . Multiple Vitamin (MULTIVITAMIN) tablet Take 1 tablet by mouth daily.       . NON FORMULARY Oxygen 4LPM  24/7       . SPIRIVA HANDIHALER 18 MCG inhalation capsule INHALE ONE DOSE BY MOUTH EVERY DAY  30 each  2  . SYMBICORT 160-4.5 MCG/ACT inhaler INHALE TWO PUFFS BY MOUTH TWICE DAILY  11 g  3  . triamcinolone cream (KENALOG) 0.1 % Apply topically 2 (two) times daily.  30 g  0  . DISCONTD: cloNIDine (CATAPRES) 0.1 MG tablet Take 0.1 mg by mouth 3 (three) times daily.       Marland Kitchen DISCONTD: famotidine (PEPCID) 20 MG tablet Take 20 mg by mouth daily as needed. For heartburn.        Allergies  Allergen Reactions  . Pioglitazone     REACTION: edema  . Rosiglitazone Maleate Nausea And Vomiting    Family History  Problem Relation Age of Onset  . Heart disease Mother   . Pancreatic cancer Mother   .  Stroke Mother   . Asthma Paternal Grandfather   . Liver cancer Father   . Diabetes Father     BP 136/80  Pulse 116  Temp 97.8 F (36.6 C) (Oral)  Resp 17  Wt 273 lb 12 oz (124.172 kg)  SpO2 95%  Review of Systems denies hypoglycemia, but she has weight gain    Objective:   Physical Exam VITAL SIGNS:  See vs page GENERAL: no distress.  Has 02 on. LUNGS:  Clear to auscultation.  Lab Results  Component Value Date   WBC 11.2* 07/02/2011   HGB 10.1* 07/02/2011   HCT 32.5* 07/02/2011   PLT 165 07/02/2011   GLUCOSE 157* 10/01/2011   CHOL 106 03/25/2011   TRIG 104 03/25/2011   HDL 34* 03/25/2011   LDLDIRECT 80.0 07/24/2009   LDLCALC 51 03/25/2011   ALT 23 07/01/2011   AST 15 07/01/2011   NA 137 10/01/2011   K 4.1 10/01/2011   CL 98 10/01/2011   CREATININE 1.5* 10/01/2011   BUN 28* 10/01/2011   CO2 32 10/01/2011   TSH 0.716 06/21/2011   INR 1.08 06/21/2011   HGBA1C 7.6* 10/01/2011   MICROALBUR 17.5* 07/24/2009      Assessment & Plan:  DM: this is the best control this pt should aim for, given this regimen, which does match insulin to her changing needs throughout the day. Renal insuff, slightly improved resp failure, improved

## 2011-10-05 ENCOUNTER — Other Ambulatory Visit: Payer: Self-pay | Admitting: Internal Medicine

## 2011-10-05 ENCOUNTER — Other Ambulatory Visit: Payer: Self-pay

## 2011-10-05 MED ORDER — ALPRAZOLAM 0.25 MG PO TABS: 0.2500 mg | ORAL_TABLET | Freq: Two times a day (BID) | ORAL | Status: DC | PRN

## 2011-10-05 NOTE — Telephone Encounter (Signed)
Rx faxed to pharmacy  

## 2011-10-08 ENCOUNTER — Telehealth: Payer: Self-pay | Admitting: Endocrinology

## 2011-10-08 NOTE — Telephone Encounter (Signed)
Dismissal Letter sent by Certified Mail 10/08/2011  Dismissal Letter returned Unclaimed 11/01/2011  Dismissal Letter sent by 1st Class Mail to [address] [facility], [place] on 11/02/2011

## 2011-10-11 ENCOUNTER — Telehealth: Payer: Self-pay | Admitting: Endocrinology

## 2011-10-11 NOTE — Telephone Encounter (Signed)
please call patient: Dismissal was because of multiple missed appts You should have a dr closer to your home However, dismissal is not yet effective, and you are welcome to come in for ov for your current sxs.

## 2011-10-11 NOTE — Telephone Encounter (Signed)
Pt notified as to why she was dismissed from Dr. George Hugh care. Was notified to try to contact [facility] to see if another provider would pick her up as a pt.

## 2011-10-11 NOTE — Telephone Encounter (Signed)
Caller: [name]/Patient; Patient Name: [name]; PCP: Romero Belling (Adults only); Best Callback Phone Number: [phone] Nausea and Diarrhea onset 10/10/11 Dizzy yesterday in the morning- none today. She is drinking plenty of fluids. Voiding qs light yellow urine. She has occasional loose cough productive for whitish/yellowish mucos. Wears O2 at 4 l/m-Triage and Care advice per Diarrhea and Upper Respriratory Infection Protocol and appointment advise within 24 hours. She was going to come for appointment today 10/11/11 and was advised that she has been discharged as a patient. She is upset since she has been a patient of [facility] for past 10 years. She would like a call back to find out what is going on.

## 2011-10-11 NOTE — Telephone Encounter (Signed)
D/c was due to multiple missed appts.

## 2011-10-12 NOTE — Telephone Encounter (Signed)
Pt was notified last night of this.

## 2011-10-19 ENCOUNTER — Inpatient Hospital Stay (HOSPITAL_COMMUNITY)
Admission: EM | Admit: 2011-10-19 | Discharge: 2011-11-01 | DRG: 207 | Disposition: A | Discharge status: Skilled Nursing Facility | Payer: Medicare Other | Attending: Pulmonary Disease | Admitting: Pulmonary Disease

## 2011-10-19 ENCOUNTER — Telehealth (HOSPITAL_COMMUNITY): Payer: Self-pay | Admitting: *Deleted

## 2011-10-19 ENCOUNTER — Ambulatory Visit (HOSPITAL_COMMUNITY): Payer: Medicare Other

## 2011-10-19 ENCOUNTER — Encounter (HOSPITAL_COMMUNITY): Payer: Self-pay | Admitting: Neurology

## 2011-10-19 ENCOUNTER — Emergency Department (HOSPITAL_COMMUNITY): Payer: Medicare Other

## 2011-10-19 ENCOUNTER — Inpatient Hospital Stay (HOSPITAL_COMMUNITY): Payer: Medicare Other

## 2011-10-19 DIAGNOSIS — N189 Chronic kidney disease, unspecified: Secondary | ICD-10-CM | POA: Diagnosis present

## 2011-10-19 DIAGNOSIS — D72829 Elevated white blood cell count, unspecified: Secondary | ICD-10-CM

## 2011-10-19 DIAGNOSIS — Z6841 Body Mass Index (BMI) 40.0 and over, adult: Secondary | ICD-10-CM

## 2011-10-19 DIAGNOSIS — R0602 Shortness of breath: Secondary | ICD-10-CM

## 2011-10-19 DIAGNOSIS — Z79899 Other long term (current) drug therapy: Secondary | ICD-10-CM

## 2011-10-19 DIAGNOSIS — Z800 Family history of malignant neoplasm of digestive organs: Secondary | ICD-10-CM

## 2011-10-19 DIAGNOSIS — R930 Abnormal findings on diagnostic imaging of skull and head, not elsewhere classified: Secondary | ICD-10-CM

## 2011-10-19 DIAGNOSIS — R300 Dysuria: Secondary | ICD-10-CM

## 2011-10-19 DIAGNOSIS — I129 Hypertensive chronic kidney disease with stage 1 through stage 4 chronic kidney disease, or unspecified chronic kidney disease: Secondary | ICD-10-CM | POA: Diagnosis present

## 2011-10-19 DIAGNOSIS — I5021 Acute systolic (congestive) heart failure: Secondary | ICD-10-CM

## 2011-10-19 DIAGNOSIS — Z794 Long term (current) use of insulin: Secondary | ICD-10-CM

## 2011-10-19 DIAGNOSIS — R059 Cough, unspecified: Secondary | ICD-10-CM

## 2011-10-19 DIAGNOSIS — I255 Ischemic cardiomyopathy: Secondary | ICD-10-CM

## 2011-10-19 DIAGNOSIS — Z780 Asymptomatic menopausal state: Secondary | ICD-10-CM

## 2011-10-19 DIAGNOSIS — Z823 Family history of stroke: Secondary | ICD-10-CM

## 2011-10-19 DIAGNOSIS — F341 Dysthymic disorder: Secondary | ICD-10-CM | POA: Diagnosis present

## 2011-10-19 DIAGNOSIS — N19 Unspecified kidney failure: Secondary | ICD-10-CM

## 2011-10-19 DIAGNOSIS — D509 Iron deficiency anemia, unspecified: Secondary | ICD-10-CM

## 2011-10-19 DIAGNOSIS — Z833 Family history of diabetes mellitus: Secondary | ICD-10-CM

## 2011-10-19 DIAGNOSIS — J4489 Other specified chronic obstructive pulmonary disease: Secondary | ICD-10-CM

## 2011-10-19 DIAGNOSIS — N289 Disorder of kidney and ureter, unspecified: Secondary | ICD-10-CM

## 2011-10-19 DIAGNOSIS — R109 Unspecified abdominal pain: Secondary | ICD-10-CM

## 2011-10-19 DIAGNOSIS — J189 Pneumonia, unspecified organism: Secondary | ICD-10-CM | POA: Diagnosis present

## 2011-10-19 DIAGNOSIS — M545 Low back pain, unspecified: Secondary | ICD-10-CM

## 2011-10-19 DIAGNOSIS — IMO0002 Reserved for concepts with insufficient information to code with codable children: Secondary | ICD-10-CM

## 2011-10-19 DIAGNOSIS — R42 Dizziness and giddiness: Secondary | ICD-10-CM

## 2011-10-19 DIAGNOSIS — Z66 Do not resuscitate: Secondary | ICD-10-CM | POA: Diagnosis not present

## 2011-10-19 DIAGNOSIS — R0902 Hypoxemia: Secondary | ICD-10-CM

## 2011-10-19 DIAGNOSIS — I951 Orthostatic hypotension: Secondary | ICD-10-CM

## 2011-10-19 DIAGNOSIS — J449 Chronic obstructive pulmonary disease, unspecified: Secondary | ICD-10-CM

## 2011-10-19 DIAGNOSIS — M79609 Pain in unspecified limb: Secondary | ICD-10-CM

## 2011-10-19 DIAGNOSIS — B372 Candidiasis of skin and nail: Secondary | ICD-10-CM | POA: Diagnosis present

## 2011-10-19 DIAGNOSIS — J9620 Acute and chronic respiratory failure, unspecified whether with hypoxia or hypercapnia: Principal | ICD-10-CM

## 2011-10-19 DIAGNOSIS — Z87891 Personal history of nicotine dependence: Secondary | ICD-10-CM

## 2011-10-19 DIAGNOSIS — R609 Edema, unspecified: Secondary | ICD-10-CM

## 2011-10-19 DIAGNOSIS — F411 Generalized anxiety disorder: Secondary | ICD-10-CM

## 2011-10-19 DIAGNOSIS — Z8249 Family history of ischemic heart disease and other diseases of the circulatory system: Secondary | ICD-10-CM

## 2011-10-19 DIAGNOSIS — J811 Chronic pulmonary edema: Secondary | ICD-10-CM

## 2011-10-19 DIAGNOSIS — I5023 Acute on chronic systolic (congestive) heart failure: Secondary | ICD-10-CM

## 2011-10-19 DIAGNOSIS — E7800 Pure hypercholesterolemia, unspecified: Secondary | ICD-10-CM

## 2011-10-19 DIAGNOSIS — E119 Type 2 diabetes mellitus without complications: Secondary | ICD-10-CM

## 2011-10-19 DIAGNOSIS — J441 Chronic obstructive pulmonary disease with (acute) exacerbation: Secondary | ICD-10-CM

## 2011-10-19 DIAGNOSIS — I5043 Acute on chronic combined systolic (congestive) and diastolic (congestive) heart failure: Secondary | ICD-10-CM | POA: Diagnosis present

## 2011-10-19 DIAGNOSIS — J9600 Acute respiratory failure, unspecified whether with hypoxia or hypercapnia: Secondary | ICD-10-CM

## 2011-10-19 DIAGNOSIS — G5600 Carpal tunnel syndrome, unspecified upper limb: Secondary | ICD-10-CM

## 2011-10-19 DIAGNOSIS — R7989 Other specified abnormal findings of blood chemistry: Secondary | ICD-10-CM | POA: Diagnosis not present

## 2011-10-19 DIAGNOSIS — E1029 Type 1 diabetes mellitus with other diabetic kidney complication: Secondary | ICD-10-CM

## 2011-10-19 DIAGNOSIS — Z9981 Dependence on supplemental oxygen: Secondary | ICD-10-CM

## 2011-10-19 DIAGNOSIS — R0789 Other chest pain: Secondary | ICD-10-CM

## 2011-10-19 DIAGNOSIS — I509 Heart failure, unspecified: Secondary | ICD-10-CM | POA: Diagnosis present

## 2011-10-19 DIAGNOSIS — N2581 Secondary hyperparathyroidism of renal origin: Secondary | ICD-10-CM

## 2011-10-19 DIAGNOSIS — G9349 Other encephalopathy: Secondary | ICD-10-CM | POA: Diagnosis present

## 2011-10-19 DIAGNOSIS — I2510 Atherosclerotic heart disease of native coronary artery without angina pectoris: Secondary | ICD-10-CM

## 2011-10-19 DIAGNOSIS — E876 Hypokalemia: Secondary | ICD-10-CM

## 2011-10-19 DIAGNOSIS — G4733 Obstructive sleep apnea (adult) (pediatric): Secondary | ICD-10-CM | POA: Diagnosis present

## 2011-10-19 DIAGNOSIS — R05 Cough: Secondary | ICD-10-CM

## 2011-10-19 DIAGNOSIS — I10 Essential (primary) hypertension: Secondary | ICD-10-CM

## 2011-10-19 DIAGNOSIS — J9610 Chronic respiratory failure, unspecified whether with hypoxia or hypercapnia: Secondary | ICD-10-CM

## 2011-10-19 DIAGNOSIS — E662 Morbid (severe) obesity with alveolar hypoventilation: Secondary | ICD-10-CM | POA: Diagnosis present

## 2011-10-19 DIAGNOSIS — R9439 Abnormal result of other cardiovascular function study: Secondary | ICD-10-CM

## 2011-10-19 DIAGNOSIS — E870 Hyperosmolality and hypernatremia: Secondary | ICD-10-CM | POA: Diagnosis not present

## 2011-10-19 DIAGNOSIS — I5022 Chronic systolic (congestive) heart failure: Secondary | ICD-10-CM

## 2011-10-19 DIAGNOSIS — D649 Anemia, unspecified: Secondary | ICD-10-CM | POA: Diagnosis present

## 2011-10-19 DIAGNOSIS — R570 Cardiogenic shock: Secondary | ICD-10-CM | POA: Diagnosis present

## 2011-10-19 DIAGNOSIS — I428 Other cardiomyopathies: Secondary | ICD-10-CM | POA: Diagnosis present

## 2011-10-19 DIAGNOSIS — Z825 Family history of asthma and other chronic lower respiratory diseases: Secondary | ICD-10-CM

## 2011-10-19 DIAGNOSIS — I5033 Acute on chronic diastolic (congestive) heart failure: Secondary | ICD-10-CM

## 2011-10-19 DIAGNOSIS — I4891 Unspecified atrial fibrillation: Secondary | ICD-10-CM

## 2011-10-19 DIAGNOSIS — R079 Chest pain, unspecified: Secondary | ICD-10-CM

## 2011-10-19 LAB — CBC WITH DIFFERENTIAL/PLATELET
Basophils Absolute: 0 10*3/uL (ref 0.0–0.1)
Basophils Relative: 0 % (ref 0–1)
Eosinophils Absolute: 0.2 10*3/uL (ref 0.0–0.7)
Hemoglobin: 11 g/dL — ABNORMAL LOW (ref 12.0–15.0)
MCH: 33.7 pg (ref 26.0–34.0)
MCHC: 34.4 g/dL (ref 30.0–36.0)
Monocytes Absolute: 0.9 10*3/uL (ref 0.1–1.0)
Monocytes Relative: 5 % (ref 3–12)
Neutro Abs: 13.4 10*3/uL — ABNORMAL HIGH (ref 1.7–7.7)
Neutrophils Relative %: 75 % (ref 43–77)
RDW: 15 % (ref 11.5–15.5)

## 2011-10-19 LAB — URINALYSIS, MICROSCOPIC ONLY
Ketones, ur: NEGATIVE mg/dL
Leukocytes, UA: NEGATIVE
Nitrite: NEGATIVE
Specific Gravity, Urine: 1.021 (ref 1.005–1.030)
pH: 5.5 (ref 5.0–8.0)

## 2011-10-19 LAB — POCT I-STAT, CHEM 8
BUN: 31 mg/dL — ABNORMAL HIGH (ref 6–23)
Chloride: 100 mEq/L (ref 96–112)
HCT: 36 % (ref 36.0–46.0)
Potassium: 4.7 mEq/L (ref 3.5–5.1)
Sodium: 140 mEq/L (ref 135–145)

## 2011-10-19 LAB — HEMOGLOBIN A1C
Hgb A1c MFr Bld: 7.3 % — ABNORMAL HIGH (ref <5.7)
Mean Plasma Glucose: 163 mg/dL — ABNORMAL HIGH (ref <117)

## 2011-10-19 LAB — POCT I-STAT 3, ART BLOOD GAS (G3+)
Acid-Base Excess: 6 mmol/L — ABNORMAL HIGH (ref 0.0–2.0)
Acid-Base Excess: 6 mmol/L — ABNORMAL HIGH (ref 0.0–2.0)
O2 Saturation: 98 %
O2 Saturation: 95 %
TCO2: 42 mmol/L (ref 0–100)
pCO2 arterial: 120.1 mmHg (ref 35.0–45.0)
pCO2 arterial: 71.9 mmHg (ref 35.0–45.0)

## 2011-10-19 LAB — COMPREHENSIVE METABOLIC PANEL
AST: 77 U/L — ABNORMAL HIGH (ref 0–37)
Albumin: 3.3 g/dL — ABNORMAL LOW (ref 3.5–5.2)
BUN: 26 mg/dL — ABNORMAL HIGH (ref 6–23)
Chloride: 99 mEq/L (ref 96–112)
Creatinine, Ser: 1.32 mg/dL — ABNORMAL HIGH (ref 0.50–1.10)
Potassium: 4.8 mEq/L (ref 3.5–5.1)
Total Protein: 7.5 g/dL (ref 6.0–8.3)

## 2011-10-19 LAB — TROPONIN I
Troponin I: <0.3 ng/mL (ref <0.30)
Troponin I: <0.3 ng/mL (ref <0.30)

## 2011-10-19 LAB — LACTIC ACID, PLASMA: Lactic Acid, Venous: 1.5 mmol/L (ref 0.5–2.2)

## 2011-10-19 LAB — PRO B NATRIURETIC PEPTIDE: Pro B Natriuretic peptide (BNP): 9736 pg/mL — ABNORMAL HIGH (ref 0–125)

## 2011-10-19 MED ORDER — VANCOMYCIN HCL 1000 MG IV SOLR
1750.0000 mg | INTRAVENOUS | Status: DC
  Administered 2011-10-20: 1750 mg via INTRAVENOUS
  Filled 2011-10-19 (×2): qty 1750

## 2011-10-19 MED ORDER — ASPIRIN 300 MG RE SUPP: 300.0000 mg | RECTAL | Status: AC

## 2011-10-19 MED ORDER — FENTANYL CITRATE 0.05 MG/ML IJ SOLN
INTRAMUSCULAR | Status: AC
  Administered 2011-10-19: 100 ug
  Filled 2011-10-19: qty 2

## 2011-10-19 MED ORDER — MIDAZOLAM HCL 2 MG/2ML IJ SOLN
1.0000 mg | INTRAMUSCULAR | Status: DC | PRN
  Administered 2011-10-19: 4 mg via INTRAVENOUS
  Filled 2011-10-19: qty 2
  Filled 2011-10-19: qty 4

## 2011-10-19 MED ORDER — MIDAZOLAM HCL 10 MG/2ML IJ SOLN: 4.0000 mg | Freq: Once | INTRAMUSCULAR | Status: DC

## 2011-10-19 MED ORDER — PANTOPRAZOLE SODIUM 40 MG IV SOLR
40.0000 mg | Freq: Every day | INTRAVENOUS | Status: DC
  Administered 2011-10-19 – 2011-10-28 (×10): 40 mg via INTRAVENOUS
  Filled 2011-10-19 (×13): qty 40

## 2011-10-19 MED ORDER — PROPOFOL 10 MG/ML IV EMUL
5.0000 ug/kg/min | INTRAVENOUS | Status: DC
  Administered 2011-10-19: 10 ug/kg/min via INTRAVENOUS
  Administered 2011-10-20: 20 ug/kg/min via INTRAVENOUS
  Filled 2011-10-19: qty 100
  Filled 2011-10-19: qty 200

## 2011-10-19 MED ORDER — FLUCONAZOLE IN SODIUM CHLORIDE 200-0.9 MG/100ML-% IV SOLN
200.0000 mg | Freq: Once | INTRAVENOUS | Status: AC
  Administered 2011-10-19: 200 mg via INTRAVENOUS
  Filled 2011-10-19: qty 100

## 2011-10-19 MED ORDER — PROPOFOL 10 MG/ML IV EMUL
5.0000 ug/kg/min | Freq: Once | INTRAVENOUS | Status: AC
  Administered 2011-10-19: 25 ug/kg/min via INTRAVENOUS
  Filled 2011-10-19: qty 100

## 2011-10-19 MED ORDER — MIDAZOLAM HCL 2 MG/2ML IJ SOLN
INTRAMUSCULAR | Status: AC
  Administered 2011-10-19: 2 mg
  Filled 2011-10-19: qty 2

## 2011-10-19 MED ORDER — FUROSEMIDE 10 MG/ML IJ SOLN
80.0000 mg | Freq: Two times a day (BID) | INTRAMUSCULAR | Status: DC
  Administered 2011-10-20: 40 mg via INTRAVENOUS
  Administered 2011-10-20 – 2011-10-21 (×2): 80 mg via INTRAVENOUS
  Filled 2011-10-19 (×5): qty 8

## 2011-10-19 MED ORDER — SODIUM CHLORIDE 0.9 % IV SOLN
250.0000 mL | INTRAVENOUS | Status: DC | PRN
  Administered 2011-10-19: 250 mL via INTRAVENOUS
  Administered 2011-10-22: 20 mL/h via INTRAVENOUS

## 2011-10-19 MED ORDER — ETOMIDATE 2 MG/ML IV SOLN
INTRAVENOUS | Status: AC
  Filled 2011-10-19: qty 20

## 2011-10-19 MED ORDER — PIPERACILLIN-TAZOBACTAM 3.375 G IVPB
3.3750 g | Freq: Four times a day (QID) | INTRAVENOUS | Status: DC
  Administered 2011-10-19: 3.375 g via INTRAVENOUS
  Filled 2011-10-19: qty 50

## 2011-10-19 MED ORDER — MIDAZOLAM HCL 2 MG/2ML IJ SOLN
INTRAMUSCULAR | Status: AC
  Filled 2011-10-19: qty 4

## 2011-10-19 MED ORDER — MIDAZOLAM HCL 2 MG/2ML IJ SOLN
6.0000 mg | Freq: Once | INTRAMUSCULAR | Status: AC
  Administered 2011-10-19: 6 mg via INTRAVENOUS
  Filled 2011-10-19: qty 6

## 2011-10-19 MED ORDER — FUROSEMIDE 10 MG/ML IJ SOLN
80.0000 mg | Freq: Once | INTRAMUSCULAR | Status: AC
  Administered 2011-10-19: 80 mg via INTRAVENOUS
  Filled 2011-10-19 (×2): qty 4

## 2011-10-19 MED ORDER — IPRATROPIUM BROMIDE 0.02 % IN SOLN
0.5000 mg | Freq: Once | RESPIRATORY_TRACT | Status: AC
  Administered 2011-10-19: 0.5 mg via RESPIRATORY_TRACT

## 2011-10-19 MED ORDER — VANCOMYCIN HCL 1000 MG IV SOLR
2500.0000 mg | Freq: Once | INTRAVENOUS | Status: AC
  Administered 2011-10-19: 2500 mg via INTRAVENOUS
  Filled 2011-10-19: qty 2500

## 2011-10-19 MED ORDER — ROCURONIUM BROMIDE 50 MG/5ML IV SOLN
INTRAVENOUS | Status: AC
  Filled 2011-10-19: qty 2

## 2011-10-19 MED ORDER — FERROUS SULFATE 325 (65 FE) MG PO TABS
325.0000 mg | ORAL_TABLET | Freq: Every day | ORAL | Status: DC
  Administered 2011-10-25: 325 mg via ORAL
  Filled 2011-10-19 (×8): qty 1

## 2011-10-19 MED ORDER — FENTANYL CITRATE 0.05 MG/ML IJ SOLN
25.0000 ug | INTRAMUSCULAR | Status: DC | PRN
  Administered 2011-10-19 – 2011-10-20 (×4): 50 ug via INTRAVENOUS
  Filled 2011-10-19 (×3): qty 2

## 2011-10-19 MED ORDER — INSULIN ASPART 100 UNIT/ML ~~LOC~~ SOLN
2.0000 [IU] | SUBCUTANEOUS | Status: DC
  Administered 2011-10-19: 6 [IU] via SUBCUTANEOUS
  Administered 2011-10-20 (×2): 2 [IU] via SUBCUTANEOUS
  Administered 2011-10-20 – 2011-10-21 (×2): 4 [IU] via SUBCUTANEOUS
  Administered 2011-10-21: 2 [IU] via SUBCUTANEOUS
  Administered 2011-10-21: 6 [IU] via SUBCUTANEOUS
  Administered 2011-10-22 (×2): 2 [IU] via SUBCUTANEOUS
  Administered 2011-10-22 – 2011-10-24 (×7): 4 [IU] via SUBCUTANEOUS
  Administered 2011-10-24: 6 [IU] via SUBCUTANEOUS
  Administered 2011-10-25 (×2): 4 [IU] via SUBCUTANEOUS
  Administered 2011-10-25: 6 [IU] via SUBCUTANEOUS
  Administered 2011-10-25: 4 [IU] via SUBCUTANEOUS
  Administered 2011-10-25: 6 [IU] via SUBCUTANEOUS
  Administered 2011-10-25 – 2011-10-26 (×2): 4 [IU] via SUBCUTANEOUS
  Administered 2011-10-26: 2 [IU] via SUBCUTANEOUS
  Administered 2011-10-26 (×3): 4 [IU] via SUBCUTANEOUS
  Administered 2011-10-27: 2 [IU] via SUBCUTANEOUS
  Administered 2011-10-27: 6 [IU] via SUBCUTANEOUS
  Administered 2011-10-27 (×2): 4 [IU] via SUBCUTANEOUS
  Administered 2011-10-28: 6 [IU] via SUBCUTANEOUS
  Administered 2011-10-28: 3 [IU] via SUBCUTANEOUS
  Administered 2011-10-28 (×3): 6 [IU] via SUBCUTANEOUS

## 2011-10-19 MED ORDER — FENTANYL CITRATE 0.05 MG/ML IJ SOLN
100.0000 ug | Freq: Once | INTRAMUSCULAR | Status: AC
  Administered 2011-10-19: 100 ug via INTRAVENOUS

## 2011-10-19 MED ORDER — ASPIRIN 81 MG PO CHEW: 324.0000 mg | CHEWABLE_TABLET | ORAL | Status: AC

## 2011-10-19 MED ORDER — FENTANYL CITRATE 0.05 MG/ML IJ SOLN: 100.0000 ug | Freq: Once | INTRAMUSCULAR | Status: DC

## 2011-10-19 MED ORDER — IPRATROPIUM BROMIDE 0.02 % IN SOLN
RESPIRATORY_TRACT | Status: AC
  Filled 2011-10-19: qty 2.5

## 2011-10-19 MED ORDER — ALBUTEROL SULFATE (5 MG/ML) 0.5% IN NEBU
10.0000 mg | INHALATION_SOLUTION | Freq: Once | RESPIRATORY_TRACT | Status: AC
  Administered 2011-10-19: 10 mg via RESPIRATORY_TRACT

## 2011-10-19 MED ORDER — FENTANYL CITRATE 0.05 MG/ML IJ SOLN
INTRAMUSCULAR | Status: AC
  Filled 2011-10-19: qty 2

## 2011-10-19 MED ORDER — FENTANYL CITRATE 0.05 MG/ML IJ SOLN
100.0000 ug | Freq: Once | INTRAMUSCULAR | Status: AC
  Administered 2011-10-19: 100 ug via INTRAVENOUS
  Filled 2011-10-19: qty 2

## 2011-10-19 MED ORDER — FLUCONAZOLE IN SODIUM CHLORIDE 200-0.9 MG/100ML-% IV SOLN
200.0000 mg | INTRAVENOUS | Status: AC
  Administered 2011-10-20 – 2011-10-21 (×2): 200 mg via INTRAVENOUS
  Filled 2011-10-19 (×2): qty 100

## 2011-10-19 MED ORDER — LIDOCAINE HCL (CARDIAC) 20 MG/ML IV SOLN
INTRAVENOUS | Status: AC
  Filled 2011-10-19: qty 5

## 2011-10-19 MED ORDER — SODIUM CHLORIDE 0.9 % IV SOLN
INTRAVENOUS | Status: DC
  Administered 2011-10-19: 17:00:00 via INTRAVENOUS

## 2011-10-19 MED ORDER — MIDAZOLAM HCL 2 MG/2ML IJ SOLN
4.0000 mg | Freq: Once | INTRAMUSCULAR | Status: AC
  Administered 2011-10-19: 4 mg via INTRAVENOUS

## 2011-10-19 MED ORDER — SUCCINYLCHOLINE CHLORIDE 20 MG/ML IJ SOLN
INTRAMUSCULAR | Status: AC
  Filled 2011-10-19: qty 1

## 2011-10-19 MED ORDER — PIPERACILLIN-TAZOBACTAM 3.375 G IVPB
3.3750 g | Freq: Three times a day (TID) | INTRAVENOUS | Status: DC
  Administered 2011-10-20 – 2011-10-26 (×19): 3.375 g via INTRAVENOUS
  Filled 2011-10-19 (×20): qty 50

## 2011-10-19 MED ORDER — ETOMIDATE 2 MG/ML IV SOLN
INTRAVENOUS | Status: AC | PRN
  Administered 2011-10-19: 25 mg via INTRAVENOUS

## 2011-10-19 MED ORDER — ALBUTEROL SULFATE (5 MG/ML) 0.5% IN NEBU
INHALATION_SOLUTION | RESPIRATORY_TRACT | Status: AC
  Filled 2011-10-19: qty 2

## 2011-10-19 MED ORDER — SUCCINYLCHOLINE CHLORIDE 20 MG/ML IJ SOLN
INTRAMUSCULAR | Status: AC | PRN
  Administered 2011-10-19: 100 mg via INTRAVENOUS

## 2011-10-19 NOTE — ED Notes (Signed)
Per ems- Pt comes from home c/o cp x 2 days. Pt was alert, sitting on side of bed, 91% on 2 L 8/10 cp right chest no radiation. In route pt became anxious, became sob, pt put on NRB. Given 5 albuterol, 125 solumedrol. Lung sounds diminished but clear. Mental status declined upon arrival, became lethargic. BP 180/94, HR 120 ST, on NRB 91% upon arrival.

## 2011-10-19 NOTE — Progress Notes (Signed)
Pt placed on NIV for increased WOB, decreased O2 sats. Pt did not tolerate NIV, and was intubated by ED physician.

## 2011-10-19 NOTE — ED Notes (Signed)
Pt on bipap 78%. EDP preparing to intubate.

## 2011-10-19 NOTE — ED Notes (Signed)
Dr. Molli Knock remains at bedside

## 2011-10-19 NOTE — H&P (Signed)
Name: [name] MRN: [id number] DOB: 16-Oct-1945    LOS: 0  Referring Provider:  EDP -Patria Mane Reason for Referral:  Acute Respiratory Failure    PULMONARY / CRITICAL CARE MEDICINE  HPI:  66 y/o Morbidly Obese WF with extensive PMH presented to [facility] ED on 10/8 after calling EMS for a two day history of shortness of breath and chest pain. Upon arrival to ED was lethargic, she was placed on BiPap, given albuterol / lasix without improvement in symptoms requiring intubation.  PCCM called for ICU admit.    Past Medical History  Diagnosis Date  . RENAL INSUFFICIENCY 10/15/2008  . Edema 06/18/2008  . PNEUMONIA 12/01/2007  . CARPAL TUNNEL SYNDROME, BILATERAL 10/30/2007  . HAND PAIN, LEFT 09/04/2007  . Morbid obesity   . ANXIETY 11/08/2006  . HYPERTENSION 11/08/2006  . DIABETES MELLITUS, TYPE II 11/08/2006  . HYPERCHOLESTEROLEMIA 07/24/2009  . ANEMIA 07/24/2009  . COPD 11/08/2006    -HFA 50% p coaching 10/15/2009  . RESPIRATORY FAILURE, CHRONIC 10/15/2009  . BACK PAIN, LUMBAR 03/07/2009  . CHEST PAIN 07/24/2009  . Restless leg syndrome   . Systolic CHF, acute 03/24/2011  . Ischemic cardiomyopathy 03/26/2011   Past Surgical History  Procedure Date  . Abdominal hysterectomy 1987   Prior to Admission medications   Medication Sig Start Date End Date Taking? Authorizing Provider  albuterol (PROVENTIL HFA;VENTOLIN HFA) 108 (90 BASE) MCG/ACT inhaler Inhale 2 puffs into the lungs every 6 (six) hours as needed. For wheeze or shortness of breath    Historical Provider, MD  albuterol (PROVENTIL) (2.5 MG/3ML) 0.083% nebulizer solution USE ONE VIAL IN NEBULIZER 4 TIMES DAILY OR EVERY 6 HOURS AS NEEDED 02/16/11 02/16/12  Nyoka Cowden, MD  ALPRAZolam Prudy Feeler) 0.25 MG tablet Take 1 tablet (0.25 mg total) by mouth 2 (two) times daily as needed for sleep. 10/05/11   Romero Belling, MD  amLODipine (NORVASC) 5 MG tablet Take 1 tablet (5 mg total) by mouth daily. 04/28/11 04/27/12  Romero Belling, MD  aspirin 81  MG tablet Take 81 mg by mouth daily.     Historical Provider, MD  calcitRIOL (ROCALTROL) 0.25 MCG capsule Take 0.25 mcg by mouth daily.      Historical Provider, MD  citalopram (CELEXA) 40 MG tablet Take 0.5 tablets (20 mg total) by mouth daily. 09/19/11   Romero Belling, MD  ferrous sulfate 325 (65 FE) MG tablet Take 325 mg by mouth daily with breakfast.    Historical Provider, MD  furosemide (LASIX) 40 MG tablet Take 1 tablet (40 mg total) by mouth daily. 07/02/11   Simbiso Ranga, MD  guaiFENesin (MUCINEX) 600 MG 12 hr tablet Take 2 tablets (1,200 mg total) by mouth 2 (two) times daily. 07/02/11 07/01/12  Simbiso Ranga, MD  insulin aspart protamine-insulin aspart (NOVOLOG 70/30) (70-30) 100 UNIT/ML injection Inject 30 Units into the skin 2 (two) times daily with a meal. 06/14/11 06/13/12  Romero Belling, MD  Multiple Vitamin (MULTIVITAMIN) tablet Take 1 tablet by mouth daily.     Historical Provider, MD  NON FORMULARY Oxygen 4LPM  24/7     Historical Provider, MD  SPIRIVA HANDIHALER 18 MCG inhalation capsule INHALE ONE DOSE BY MOUTH EVERY DAY 04/19/11   Romero Belling, MD  SYMBICORT 160-4.5 MCG/ACT inhaler INHALE TWO PUFFS BY MOUTH TWICE DAILY 03/18/11   Romero Belling, MD  triamcinolone cream (KENALOG) 0.1 % Apply topically 2 (two) times daily. 05/24/11 05/23/12  Romero Belling, MD   Allergies Allergies  Allergen Reactions  .  Pioglitazone     REACTION: edema  . Rosiglitazone Maleate Nausea And Vomiting    Family History Family History  Problem Relation Age of Onset  . Heart disease Mother   . Pancreatic cancer Mother   . Stroke Mother   . Asthma Paternal Grandfather   . Liver cancer Father   . Diabetes Father    Social History  reports that she quit smoking about 3 years ago. She does not have any smokeless tobacco history on file. She reports that she does not drink alcohol or use illicit drugs.  Review Of Systems:  Unable to complete  Events Since Admission: 10/8 - Admit with chest pain,  respiratory failure, AMS   Vital Signs: Temp:  [99 F (37.2 C)-99.7 F (37.6 C)] 99 F (37.2 C) (10/08 1615) Pulse Rate:  [110-154] 110  (10/08 1615) Resp:  [16-25] 18  (10/08 1615) BP: (95-215)/(23-154) 101/33 mmHg (10/08 1615) SpO2:  [83 %-100 %] 96 % (10/08 1615) FiO2 (%):  [60 %-100 %] 60 % (10/08 1535) Weight:  [273 lb 13 oz (124.2 kg)] 273 lb 13 oz (124.2 kg) (10/08 1509)  Physical Examination: General:  Morbidly obese in NAD on vent Neuro:  Sedated, moves ext's spontaneously HEENT:  Mm pink/moist, OETT Cardiovascular:  s1s2 rrr, no m/r/g Lungs:  resp's even/non-labored, lungs bilaterally with decreased air movt, wheezes Abdomen:  Obese/soft, erythematous (yeast-like) appearance & odor rash under pannus  Musculoskeletal:  No acute deformities Skin:  Intact, rash under pannus (see above), long unkept toenails  Active Problems:  * No active hospital problems. *    ASSESSMENT AND PLAN  PULMONARY  Lab 10/19/11 1454  PHART 7.121*  PCO2ART 120.1*  PO2ART 163.0*  HCO3 38.8*  O2SAT 98.0   Ventilator Settings: Vent Mode:  [-] PRVC FiO2 (%):  [60 %-100 %] 60 % Set Rate:  [10 bmp-16 bmp] 16 bmp Vt Set:  [500 mL] 500 mL PEEP:  [5 cmH20] 5 cmH20 Plateau Pressure:  [35 cmH20] 35 cmH20 CXR:  10/8 >>>pulmonary edema ETT:  10/8>>>  A:   Acute Hypercarbic Respiratory Failure COPD - on spiriva, symbicort, BD's at home  P:   -full vent support -f/u abg in one hour for vent adjustment -now cxr to eval ETT & Central line placement -SBT in am -PRN sedation protocol -->became hypotensive with propofol / versed for intubation  CARDIOVASCULAR  Lab 10/19/11 1454 10/19/11 1453  TROPONINI -- <0.30  LATICACIDVEN 1.5 --  PROBNP -- 9736.0*   ECG:  SR on monitor.  EKG with PVC's, no acute ST changes Lines:  10/8 L IJ TLC>>>  A:  Sinus Tachycardia- in setting of respiratory distress Chest Pain - acute chest pain prior to arrival.  Noted pulmonary edema on cxr.  Hx of HTN  - on norvasc, lasix at home    P:  -cycle troponin  -F/U EKG in am -Cardiology Consult  -ECHO -assess CVP -continue ASA  RENAL  Lab 10/19/11 1505 10/19/11 1453  NA 140 139  K 4.7 4.8  CL 100 99  CO2 -- 32  BUN 31* 26*  CREATININE 1.50* 1.32*  CALCIUM -- 9.3  MG -- --  PHOS -- --   Intake/Output    None    Foley:  10/8 >>>  A:   Acute Renal Insufficiency -on chronic lasix at home  P:   -gentle volume resuscitation -hold lasix -monitor I/O's   GASTROINTESTINAL  Lab 10/19/11 1453  AST 77*  ALT 70*  ALKPHOS 66  BILITOT 0.5  PROT 7.5  ALBUMIN 3.3*    A:   Morbid Obesity Mild Elevation of LFT's -??if medication related   P:   -f/u LFTs in am -NPO, if remains intubated in am, consider TF  HEMATOLOGIC  Lab 10/19/11 1505 10/19/11 1453  HGB 12.2 11.0*  HCT 36.0 32.0*  PLT -- 363  INR -- --  APTT -- --   A:   No acute issues.  Hx of Anemia   P:  -monitor cbc -anemia  INFECTIOUS  Lab 10/19/11 1453  WBC 18.0*  PROCALCITON --   Cultures: 10/8 BCx2>>> 10/8 Sputum>>> 10/8 UA>>> 10/8 PCT>>>  Antibiotics: Zosyn (?HCAP) 10/8>>> Vanco (?HCAP) 10/8>>> Diflucan (yeast under pannus) 10/8>>>  A:   Bilateral airspace disease -likely edema but difficult to interperet due to body habitus  P:   -empiric abx as above -PCT algorithm -narrow abx as able -follow cultures -monitor LFT's while on Diflucan  ENDOCRINE No results found for this basename: GLUCAP:5 in the last 168 hours A:   DM - on 70/30 at home 30 units BID     P:   -ICU SSI -check A1c  NEUROLOGIC  A:   AMS - likely related to hypercarbia  Hx anxiety / depression - on xanax, celexa at home P:   -CT scan head to r/o other etiology for AMS -hold above meds   BEST PRACTICE / DISPOSITION Level of Care:  ICU Primary Service:  PCCM Consultants:   Code Status:  FULL Diet:  NPO DVT Px:  SCD's until CT head returned neg GI Px:  Protonix Skin Integrity:  Rash as  above Social / Family:  None available.   Canary Brim, NP-C [facility] Pgr: [phone] or [phone]   10/19/2011, 4:31 PM  Will r/o a cardiac event as primary issue (doubtful).  Pulmonary edema evident but BP is too soft for diureses right now.  Will actually hydrate her gently.  LFTs are elevated likely related to heart failure.  Will order echo and troponins.  Diflucan ordered (LFTs not high enough to prohibit use) for yeast infection.  Vanc/zosyn for hospital acquired infection.  Will place on ICu hyperglycemia protocol as well as full vent support with low RR and appropriate I:E ratio to prevent breath stacking.  Cardiology consult called.  Will admit to the ICU and f/u.  Head CT also ordered.  CC time 90 min.  Patient seen and examined, agree with above note.  I dictated the care and orders written for this patient under my direction.  Koren Bound, M.D. [phone]

## 2011-10-19 NOTE — Telephone Encounter (Signed)
Pt called back and stated she was going to call 9-1-1 and have them bring her to

## 2011-10-19 NOTE — Consult Note (Signed)
Primary Cardiologist: Harrietta Incorvaia Reason for Consultation: respiratory failure. ? Pulmonary edema Consulting Physician: Molli Knock  HPI:   [name] is a 66 y/o woman with multiple medical problems including morbid obesity, COPD on 4L home O2, systolic CHF with EF 30-35% DM2, HTN, CRI (baseline ~1.50 and HL. She has had multiple admissions for respiratory failure.   Was admitted in 3/13 with dyspnea thought to be due to COPD and CHF. Echo 03/24/11: LVEF 30-35% with mild concentric hypertrophy. Mild MR. LA mildly dilated.  Myoview 03/25/11: No evidence for inducible ischemia. Moderate inferolateral and small distal anteroseptal fixed defects are identified and may be related to scar. Inferior and mid/distal inferolateral hypokinesia extending into the apex. Left ventricular ejection fraction of 35%. Cath deferred as she was not candidate for CABG (due to COPD) and not having angina to warrant PCI if lesion was found. Opted for medical therapy.   Came to ER today for two days of CP and progressive dyspnea. Found to be in respiratory failure. ABG 7.1/120/163/98% on facemask. Intubated in ER. Seen by p/ccm. ROS not available as patient intubated. CXR with mild pulmonary edema. pBNP 9736 (was 7300 in 6/13). WBC 18k. POC troponin < 0.30  Being treated for PNA. Was hypotensive so receiving IVF @ 50cc/hr.Got 80 IV lasix with minimal u/o. RT reports suctioning of pink frothy sputum.    Review of Systems:  Not available due to patient intubated   Past Medical History  Diagnosis Date  . RENAL INSUFFICIENCY 10/15/2008  . Edema 06/18/2008  . PNEUMONIA 12/01/2007  . CARPAL TUNNEL SYNDROME, BILATERAL 10/30/2007  . HAND PAIN, LEFT 09/04/2007  . Morbid obesity   . ANXIETY 11/08/2006  . HYPERTENSION 11/08/2006  . DIABETES MELLITUS, TYPE II 11/08/2006  . HYPERCHOLESTEROLEMIA 07/24/2009  . ANEMIA 07/24/2009  . COPD 11/08/2006    -HFA 50% p coaching 10/15/2009  . RESPIRATORY FAILURE, CHRONIC 10/15/2009  . BACK  PAIN, LUMBAR 03/07/2009  . CHEST PAIN 07/24/2009  . Restless leg syndrome   . Systolic CHF, acute 03/24/2011  . Ischemic cardiomyopathy 03/26/2011     (Not in a hospital admission)      . albuterol  10 mg Nebulization Once  . aspirin  324 mg Oral NOW   Or  . aspirin  300 mg Rectal NOW  . fentaNYL      . fentaNYL  100 mcg Intravenous Once  . fentaNYL  100 mcg Intravenous Once  . fluconazole (DIFLUCAN) IV  200 mg Intravenous Once  . furosemide  80 mg Intravenous Once  . insulin aspart  2-6 Units Subcutaneous Q4H  . ipratropium  0.5 mg Nebulization Once  . lidocaine (cardiac) 100 mg/57ml      . midazolam      . midazolam  4 mg Intravenous Once  . midazolam  6 mg Intravenous Once  . pantoprazole (PROTONIX) IV  40 mg Intravenous QHS  . propofol  5-70 mcg/kg/min Intravenous Once  . rocuronium      . vancomycin  2,500 mg Intravenous Once  . DISCONTD: fentaNYL  100 mcg Intravenous Once  . DISCONTD: midazolam  4 mg Intravenous Once  . DISCONTD: piperacillin-tazobactam (ZOSYN)  IV  3.375 g Intravenous Q6H    Infusions:    . sodium chloride 50 mL/hr at 10/19/11 1708    Allergies  Allergen Reactions  . Pioglitazone     REACTION: edema  . Rosiglitazone Maleate Nausea And Vomiting    History   Social History  . Marital Status: Widowed  Spouse Name: N/A    Number of Children: 2  . Years of Education: N/A   Occupational History  . 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 Smoking status: Former Smoker -- 2.0 packs/day for 40 years    Quit date: 01/12/2008  . Smokeless tobacco: Not on file  . Alcohol Use: No  . Drug Use: No  . Sexually Active: Not on file   Other Topics Concern  . Not on file   Social History Narrative   Divorced. Lives with dtr.    Family History  Problem Relation Age of Onset  . Heart disease Mother   . Pancreatic cancer Mother   . Stroke Mother   . Asthma Paternal Grandfather   . Liver cancer Father   .  Diabetes Father     PHYSICAL EXAM: Filed Vitals:   10/19/11 1630  BP: 89/42  Pulse: 108  Temp: 98.8 F (37.1 C)  Resp: 16    No intake or output data in the 24 hours ending 10/19/11 1719  General: Morbidly obese in NAD on vent  Neuro: Sedated, moves ext's spontaneously  HEENT: Normal except for ETT  Cardiovascular: PMI nonpalpable. Distant HS s1s2 rrr, no m/r/g  Lungs: Diffuse rhonchi anteriorly Abdomen: Obese/soft, erythematous (yeast-like) appearance & odor rash under pannus. Nontender. Good BS Musculoskeletal: No acute deformities  Skin: Intact, rash under pannus (see above), long unkempt toenails Extremities: 2+ edema  ECG: ST 121. +PVCs. No ST-T wave abnormalities.    Results for orders placed during the hospital encounter of 10/19/11 (from the past 24 hour(s))  CBC WITH DIFFERENTIAL     Status: Abnormal   Collection Time   10/19/11  2:53 PM      Component Value Range   WBC 18.0 (*) 4.0 - 10.5 K/uL   RBC 3.26 (*) 3.87 - 5.11 MIL/uL   Hemoglobin 11.0 (*) 12.0 - 15.0 g/dL   HCT 21.3 (*) 08.6 - 57.8 %   MCV 98.2  78.0 - 100.0 fL   MCH 33.7  26.0 - 34.0 pg   MCHC 34.4  30.0 - 36.0 g/dL   RDW 46.9  62.9 - 52.8 %   Platelets 363  150 - 400 K/uL   Neutrophils Relative 75  43 - 77 %   Neutro Abs 13.4 (*) 1.7 - 7.7 K/uL   Lymphocytes Relative 20  12 - 46 %   Lymphs Abs 3.5  0.7 - 4.0 K/uL   Monocytes Relative 5  3 - 12 %   Monocytes Absolute 0.9  0.1 - 1.0 K/uL   Eosinophils Relative 1  0 - 5 %   Eosinophils Absolute 0.2  0.0 - 0.7 K/uL   Basophils Relative 0  0 - 1 %   Basophils Absolute 0.0  0.0 - 0.1 K/uL  COMPREHENSIVE METABOLIC PANEL     Status: Abnormal   Collection Time   10/19/11  2:53 PM      Component Value Range   Sodium 139  135 - 145 mEq/L   Potassium 4.8  3.5 - 5.1 mEq/L   Chloride 99  96 - 112 mEq/L   CO2 32  19 - 32 mEq/L   Glucose, Bld 340 (*) 70 - 99 mg/dL   BUN 26 (*) 6 - 23 mg/dL   Creatinine, Ser 4.13 (*) 0.50 - 1.10 mg/dL   Calcium 9.3   8.4 - 24.4 mg/dL   Total Protein 7.5  6.0 - 8.3  g/dL   Albumin 3.3 (*) 3.5 - 5.2 g/dL   AST 77 (*) 0 - 37 U/L   ALT 70 (*) 0 - 35 U/L   Alkaline Phosphatase 66  39 - 117 U/L   Total Bilirubin 0.5  0.3 - 1.2 mg/dL   GFR calc non Af Amer 41 (*) >90 mL/min   GFR calc Af Amer 48 (*) >90 mL/min  TROPONIN I     Status: Normal   Collection Time   10/19/11  2:53 PM      Component Value Range   Troponin I <0.30  <0.30 ng/mL  PRO B NATRIURETIC PEPTIDE     Status: Abnormal   Collection Time   10/19/11  2:53 PM      Component Value Range   Pro B Natriuretic peptide (BNP) 9736.0 (*) 0 - 125 pg/mL  LACTIC ACID, PLASMA     Status: Normal   Collection Time   10/19/11  2:54 PM      Component Value Range   Lactic Acid, Venous 1.5  0.5 - 2.2 mmol/L  POCT I-STAT 3, BLOOD GAS (G3+)     Status: Abnormal   Collection Time   10/19/11  2:54 PM      Component Value Range   pH, Arterial 7.121 (*) 7.350 - 7.450   pCO2 arterial 120.1 (*) 35.0 - 45.0 mmHg   pO2, Arterial 163.0 (*) 80.0 - 100.0 mmHg   Bicarbonate 38.8 (*) 20.0 - 24.0 mEq/L   TCO2 42  0 - 100 mmol/L   O2 Saturation 98.0     Acid-Base Excess 6.0 (*) 0.0 - 2.0 mmol/L   Patient temperature 99.6 F     Collection site RADIAL, ALLEN'S TEST ACCEPTABLE     Drawn by Operator     Sample type ARTERIAL     Comment NOTIFIED PHYSICIAN    POCT I-STAT, CHEM 8     Status: Abnormal   Collection Time   10/19/11  3:05 PM      Component Value Range   Sodium 140  135 - 145 mEq/L   Potassium 4.7  3.5 - 5.1 mEq/L   Chloride 100  96 - 112 mEq/L   BUN 31 (*) 6 - 23 mg/dL   Creatinine, Ser 9.14 (*) 0.50 - 1.10 mg/dL   Glucose, Bld 782 (*) 70 - 99 mg/dL   Calcium, Ion 9.56  2.13 - 1.30 mmol/L   TCO2 34  0 - 100 mmol/L   Hemoglobin 12.2  12.0 - 15.0 g/dL   HCT 08.6  57.8 - 46.9 %  URINALYSIS, MICROSCOPIC ONLY     Status: Abnormal   Collection Time   10/19/11  3:40 PM      Component Value Range   Color, Urine YELLOW  YELLOW   APPearance CLOUDY (*) CLEAR    Specific Gravity, Urine 1.021  1.005 - 1.030   pH 5.5  5.0 - 8.0   Glucose, UA NEGATIVE  NEGATIVE mg/dL   Hgb urine dipstick SMALL (*) NEGATIVE   Bilirubin Urine NEGATIVE  NEGATIVE   Ketones, ur NEGATIVE  NEGATIVE mg/dL   Protein, ur >629 (*) NEGATIVE mg/dL   Urobilinogen, UA 0.2  0.0 - 1.0 mg/dL   Nitrite NEGATIVE  NEGATIVE   Leukocytes, UA NEGATIVE  NEGATIVE   WBC, UA 0-2  <3 WBC/hpf   RBC / HPF 0-2  <3 RBC/hpf   Bacteria, UA RARE  RARE   Squamous Epithelial / LPF FEW (*) RARE   Urine-Other AMORPHOUS  URATES/PHOSPHATES     Dg Chest Port 1 View  10/19/2011  *RADIOLOGY REPORT*  Clinical Data: Left central line placement.  PORTABLE CHEST - 1 VIEW  Comparison: 10/19/2011  Findings: Left central line has been placed with the tip in the SVC.  No pneumothorax.  Endotracheal tube is 3 cm above the carina. NG tube enters the stomach.  Cardiomegaly with vascular congestion and mild pulmonary edema.  IMPRESSION: Endotracheal tube and left central line as above.  Continued CHF pattern, unchanged.   Original Report Authenticated By: Cyndie Chime, M.D.    Dg Chest Portable 1 View  10/19/2011  *RADIOLOGY REPORT*  Clinical Data: Intubated.  PORTABLE CHEST - 1 VIEW  Comparison: 06/30/2011 chest CT and 06/26/2011 chest x-ray.  Findings: Endotracheal tube tip 2.6 cm above the carina.  Cardiomegaly.  Pulmonary edema.  CT detected right upper lobe lesions not adequately addressed by plain film exam.  Please see prior CT report.  Right costophrenic angle not included present exam.  Calcified tortuous aorta.  IMPRESSION: Endotracheal tube tip 2.6 cm above the carina.  Cardiomegaly.  Pulmonary edema.  CT detected right upper lobe lesions not adequately addressed by plain film exam.  Please see prior CT report.   Original Report Authenticated By: Fuller Canada, M.D.      ASSESSMENT: 1. A/C respiratory failure 2. Shock - question cardiogenic vs septic 3. A/c systolic HF EF 30-35% 4. O2 dependent COPD 5.  Chronic renal failure 6. DM2 7. Morbid obesity 8. Chest pain  PLAN/DISCUSSION:  My suspicion is that the major issue here is CHF on top of severe COPD. Though with a WBC of 18k I cannot exclude a concomitant infectious process. We transduced a CVP off her central line and it was 17. Co-ox is pending. SBP now improved at 115-125. Will stop IVFs and give additional lasix. If co-ox low will start inotropic support with levophed. Agree with cycling cardiac markers. We will continue to follow.  [name] Clutter,MD 6:07 PM  Addendum:  Co-ox 75%. Argues against primary cardiogenic process. Continue antibiotics. Diurese as tolerated to improve oxygenation. Can use levophed or neo for BP support.   [name] Cassell,MD 6:09 PM

## 2011-10-19 NOTE — ED Notes (Signed)
Diprivan gtt stopped due to low BP

## 2011-10-19 NOTE — ED Notes (Signed)
Temp foley placed by [facility].

## 2011-10-19 NOTE — ED Notes (Signed)
Dr. Bensimhon at bedside  

## 2011-10-19 NOTE — Telephone Encounter (Signed)
Pt called c/o chest pain which she describes as "heaviness" and states it is hurting down into her left arm, she also c/o shortness of breathe, she states she can't even walk across a room w/out becoming SOB, she is concerned she maybe having a heart attack, advised pt she needs to to the hosptial and she should call 9-1-1, she states she just wanted to see Dr Gala Romney today and would like to move her appt to the afternoon, advised he is not here this afternoon and with her symptoms she needs to go to the ER, she states she may go to [facility] she will let me know

## 2011-10-19 NOTE — ED Notes (Addendum)
Admitting MD at bedside. PCCM, Dr. Molli Knock

## 2011-10-19 NOTE — Procedures (Signed)
Central Venous Catheter Insertion Procedure Note [name] [id number] 07/14/1945  Procedure: Insertion of Central Venous Catheter Indications: Assessment of intravascular volume, Drug and/or fluid administration and Frequent blood sampling  Procedure Details Consent: Unable to obtain consent because of emergent medical necessity. Time Out: Verified patient identification, verified procedure, site/side was marked, verified correct patient position, special equipment/implants available, medications/allergies/relevent history reviewed, required imaging and test results available.  Performed  Maximum sterile technique was used including antiseptics, cap, gloves, gown, hand hygiene, mask and sheet. Skin prep: Chlorhexidine; local anesthetic administered A antimicrobial bonded/coated triple lumen catheter was placed in the left internal jugular vein using the Seldinger technique.  Evaluation Blood flow good Complications: No apparent complications Patient did tolerate procedure well. Chest X-ray ordered to verify placement.  CXR: pending.  U/S used in placement.  YACOUB,WESAM 10/19/2011, 4:50 PM

## 2011-10-19 NOTE — ED Provider Notes (Signed)
History     CSN: [id number]  Arrival date & time 10/19/11  1414   First MD Initiated Contact with Patient 10/19/11 1434      Chief Complaint  Patient presents with  . Respiratory Distress    (Consider location/radiation/quality/duration/timing/severity/associated sxs/prior treatment) HPI Comments: Pt became SOB with EMS just prior to arrival in ED. They had tried two breathing treatments without much help. Patient also get 125mg  solumedrol. Pt requested xanax, but EMS did not provide.  Patient is a 66 y.o. female presenting with chest pain. The history is provided by the EMS personnel.  Chest Pain The chest pain began 2 days ago. Chest pain occurs constantly. The chest pain is unchanged. The pain is associated with breathing. Primary symptoms include shortness of breath.     Past Medical History  Diagnosis Date  . RENAL INSUFFICIENCY 10/15/2008  . Edema 06/18/2008  . PNEUMONIA 12/01/2007  . CARPAL TUNNEL SYNDROME, BILATERAL 10/30/2007  . HAND PAIN, LEFT 09/04/2007  . Morbid obesity   . ANXIETY 11/08/2006  . HYPERTENSION 11/08/2006  . DIABETES MELLITUS, TYPE II 11/08/2006  . HYPERCHOLESTEROLEMIA 07/24/2009  . ANEMIA 07/24/2009  . COPD 11/08/2006    -HFA 50% p coaching 10/15/2009  . RESPIRATORY FAILURE, CHRONIC 10/15/2009  . BACK PAIN, LUMBAR 03/07/2009  . CHEST PAIN 07/24/2009  . Restless leg syndrome   . Systolic CHF, acute 03/24/2011  . Ischemic cardiomyopathy 03/26/2011    Past Surgical History  Procedure Date  . Abdominal hysterectomy 1987    Family History  Problem Relation Age of Onset  . Heart disease Mother   . Pancreatic cancer Mother   . Stroke Mother   . Asthma Paternal Grandfather   . Liver cancer Father   . Diabetes Father     History  Substance Use Topics  . Smoking status: Former Smoker -- 2.0 packs/day for 40 years    Quit date: 01/12/2008  . Smokeless tobacco: Not on file  . Alcohol Use: No    OB History    Grav Para Term Preterm Abortions  TAB SAB Ect Mult Living                  Review of Systems  Unable to perform ROS: Intubated  Respiratory: Positive for shortness of breath.   Cardiovascular: Positive for chest pain.    Allergies  Pioglitazone and Rosiglitazone maleate  Home Medications   Current Outpatient Rx  Name Route Sig Dispense Refill  . ALBUTEROL SULFATE HFA 108 (90 BASE) MCG/ACT IN AERS Inhalation Inhale 2 puffs into the lungs every 6 (six) hours as needed. For wheeze or shortness of breath    . ALBUTEROL SULFATE (2.5 MG/3ML) 0.083% IN NEBU  USE ONE VIAL IN NEBULIZER 4 TIMES DAILY OR EVERY 6 HOURS AS NEEDED 75 mL 4  . ALPRAZOLAM 0.25 MG PO TABS Oral Take 1 tablet (0.25 mg total) by mouth 2 (two) times daily as needed for sleep. 60 tablet 2  . AMLODIPINE BESYLATE 5 MG PO TABS Oral Take 1 tablet (5 mg total) by mouth daily. 30 tablet 3  . ASPIRIN 81 MG PO TABS Oral Take 81 mg by mouth daily.     Marland Kitchen CALCITRIOL 0.25 MCG PO CAPS Oral Take 0.25 mcg by mouth daily.      Marland Kitchen CITALOPRAM HYDROBROMIDE 40 MG PO TABS Oral Take 0.5 tablets (20 mg total) by mouth daily. 15 tablet 2  . FERROUS SULFATE 325 (65 FE) MG PO TABS Oral Take 325 mg by mouth  daily with breakfast.    . FUROSEMIDE 40 MG PO TABS Oral Take 1 tablet (40 mg total) by mouth daily. 60 tablet 3  . GUAIFENESIN ER 600 MG PO TB12 Oral Take 2 tablets (1,200 mg total) by mouth 2 (two) times daily. 6 tablet 0  . INSULIN ASPART PROT & ASPART (70-30) 100 UNIT/ML Eldridge SUSP Subcutaneous Inject 30 Units into the skin 2 (two) times daily with a meal. 60 mL 1  . ONE-DAILY MULTI VITAMINS PO TABS Oral Take 1 tablet by mouth daily.     . NON FORMULARY  Oxygen 4LPM  24/7     . SPIRIVA HANDIHALER 18 MCG IN CAPS  INHALE ONE DOSE BY MOUTH EVERY DAY 30 each 2  . SYMBICORT 160-4.5 MCG/ACT IN AERO  INHALE TWO PUFFS BY MOUTH TWICE DAILY 11 g 3  . TRIAMCINOLONE ACETONIDE 0.1 % EX CREA Topical Apply topically 2 (two) times daily. 30 g 0    BP 197/154  Pulse 115  SpO2  83%  Physical Exam  Nursing note and vitals reviewed. Constitutional: She is oriented to person, place, and time. She appears well-developed and well-nourished. No distress.  HENT:  Head: Normocephalic and atraumatic.  Eyes: EOM are normal. Pupils are equal, round, and reactive to light.  Neck: Normal range of motion.  Cardiovascular: Normal rate and normal heart sounds.   Pulmonary/Chest: Accessory muscle usage present. Tachypnea noted. She is in respiratory distress. She has decreased breath sounds in the right upper field, the right middle field, the right lower field, the left upper field, the left middle field and the left lower field. She has wheezes.  Abdominal: Soft. She exhibits no distension. There is no tenderness.  Musculoskeletal: Normal range of motion. She exhibits edema (1+ pitting edema).  Neurological: She is alert and oriented to person, place, and time.  Skin: Skin is warm and dry.    ED Course  INTUBATION Date/Time: 10/19/2011 2:45 PM Performed by: Daleen Bo Authorized by: Lara Mulch E Consent: The procedure was performed in an emergent situation. Indications: respiratory distress   (including critical care time) INTUBATION Performed by: Lara Mulch E  Required items: required blood products, implants, devices, and special equipment available Patient identity confirmed: provided demographic data and hospital-assigned identification number Time out: Immediately prior to procedure a "time out" was called to verify the correct patient, procedure, equipment, support staff and site/side marked as required.  Indications: respiratory distress  Intubation method: Glidescope Laryngoscopy   Preoxygenation: Bipap  Sedatives: Etomidate Paralytic: Succinylcholine  Tube Size: 7.5 cuffed  Post-procedure assessment: chest rise and ETCO2 monitor Breath sounds: equal and absent over the epigastrium Tube secured with: ETT holder Chest x-ray interpreted by  radiologist and me.  Chest x-ray findings: endotracheal tube in appropriate position  Patient tolerated the procedure well with no immediate complications.    Labs Reviewed  CBC WITH DIFFERENTIAL - Abnormal; Notable for the following:    WBC 18.0 (*)     RBC 3.26 (*)     Hemoglobin 11.0 (*)     HCT 32.0 (*)     Neutro Abs 13.4 (*)     All other components within normal limits  COMPREHENSIVE METABOLIC PANEL - Abnormal; Notable for the following:    Glucose, Bld 340 (*)     BUN 26 (*)     Creatinine, Ser 1.32 (*)     Albumin 3.3 (*)     AST 77 (*)     ALT 70 (*)  GFR calc non Af Amer 41 (*)     GFR calc Af Amer 48 (*)     All other components within normal limits  PRO B NATRIURETIC PEPTIDE - Abnormal; Notable for the following:    Pro B Natriuretic peptide (BNP) 9736.0 (*)     All other components within normal limits  POCT I-STAT, CHEM 8 - Abnormal; Notable for the following:    BUN 31 (*)     Creatinine, Ser 1.50 (*)     Glucose, Bld 333 (*)     All other components within normal limits  URINALYSIS, MICROSCOPIC ONLY - Abnormal; Notable for the following:    APPearance CLOUDY (*)     Hgb urine dipstick SMALL (*)     Protein, ur >300 (*)     Squamous Epithelial / LPF FEW (*)     All other components within normal limits  POCT I-STAT 3, BLOOD GAS (G3+) - Abnormal; Notable for the following:    pH, Arterial 7.121 (*)     pCO2 arterial 120.1 (*)     pO2, Arterial 163.0 (*)     Bicarbonate 38.8 (*)     Acid-Base Excess 6.0 (*)     All other components within normal limits  TROPONIN I  LACTIC ACID, PLASMA  CULTURE, BLOOD (ROUTINE X 2)  CULTURE, BLOOD (ROUTINE X 2)   Dg Chest Portable 1 View  10/19/2011  *RADIOLOGY REPORT*  Clinical Data: Intubated.  PORTABLE CHEST - 1 VIEW  Comparison: 06/30/2011 chest CT and 06/26/2011 chest x-ray.  Findings: Endotracheal tube tip 2.6 cm above the carina.  Cardiomegaly.  Pulmonary edema.  CT detected right upper lobe lesions not  adequately addressed by plain film exam.  Please see prior CT report.  Right costophrenic angle not included present exam.  Calcified tortuous aorta.  IMPRESSION: Endotracheal tube tip 2.6 cm above the carina.  Cardiomegaly.  Pulmonary edema.  CT detected right upper lobe lesions not adequately addressed by plain film exam.  Please see prior CT report.   Original Report Authenticated By: Fuller Canada, M.D.      1. Respiratory failure, acute-on-chronic   2. Hypoxia       MDM  Pt seen upon arrival in ED. Pt with several days of chest pain now with respiratory distress. Pt without improvement on bipap, so patient was intubated. CXR concerning for pulmonary edema, so lasix given. Pt also with severe COPD, so will give albuterol. Pt will be sedated with Versed/Fentanyl. Will get labs as well including troponin as patient was complaining of chest pain to EMS. Pt will be admitted to ICU.        Daleen Bo, MD 10/19/11 [phone]

## 2011-10-19 NOTE — Procedures (Signed)
Arterial Catheter Insertion Procedure Note [name] [id number] 08-13-1945  Procedure: Insertion of Arterial Catheter  Indications: Blood pressure monitoring and Frequent blood sampling  Procedure Details Consent: Unable to obtain consent because of altered level of consciousness. Time Out: Verified patient identification, verified procedure, site/side was marked, verified correct patient position, special equipment/implants available, medications/allergies/relevent history reviewed, required imaging and test results available.  Performed  Maximum sterile technique was used including antiseptics, cap, gloves, gown, hand hygiene, mask and sheet. Skin prep: Chlorhexidine; local anesthetic administered 20 gauge catheter was inserted into right radial artery using the Seldinger technique.  Evaluation Blood flow good; BP tracing good. Complications: No apparent complications.   YACOUB,WESAM 10/19/2011

## 2011-10-19 NOTE — ED Provider Notes (Addendum)
I saw and evaluated the patient, reviewed the resident's note and I agree with the findings and plan.  I personally evaluated the ECG and agree with the interpretation of the resident  CRITICAL CARE Performed by: Lyanne Co Total critical care time: 30 Critical care time was exclusive of separately billable procedures and treating other patients. Critical care was necessary to treat or prevent imminent or life-threatening deterioration. Critical care was time spent personally by me on the following activities: development of treatment plan with patient and/or surrogate as well as nursing, discussions with consultants, evaluation of patient's response to treatment, examination of patient, obtaining history from patient or surrogate, ordering and performing treatments and interventions, ordering and review of laboratory studies, ordering and review of radiographic studies, pulse oximetry and re-evaluation of patient's condition.  The primary encounter diagnosis was Respiratory failure, acute-on-chronic. A diagnosis of Hypoxia was also pertinent to this visit. Dg Chest Portable 1 View  10/19/2011  *RADIOLOGY REPORT*  Clinical Data: Intubated.  PORTABLE CHEST - 1 VIEW  Comparison: 06/30/2011 chest CT and 06/26/2011 chest x-ray.  Findings: Endotracheal tube tip 2.6 cm above the carina.  Cardiomegaly.  Pulmonary edema.  CT detected right upper lobe lesions not adequately addressed by plain film exam.  Please see prior CT report.  Right costophrenic angle not included present exam.  Calcified tortuous aorta.  IMPRESSION: Endotracheal tube tip 2.6 cm above the carina.  Cardiomegaly.  Pulmonary edema.  CT detected right upper lobe lesions not adequately addressed by plain film exam.  Please see prior CT report.   Original Report Authenticated By: Fuller Canada, M.D.    I personally reviewed the imaging tests through PACS system  I reviewed available ER/hospitalization records thought the EMR  I was  present for the entire intubation  Lyanne Co, MD 10/19/11 1616  Lyanne Co, MD 10/19/11 [phone]

## 2011-10-19 NOTE — Consult Note (Signed)
ANTIBIOTIC CONSULT NOTE - INITIAL  Pharmacy Consult for Vancomycin and Zosyn Indication: rule out pneumonia  Allergies  Allergen Reactions  . Pioglitazone     REACTION: edema  . Rosiglitazone Maleate Nausea And Vomiting    Patient Measurements: Height: 5' 6.93" (170 cm) Weight: 273 lb 13 oz (124.2 kg) IBW/kg (Calculated) : 61.44   Vital Signs: Temp: 98.8 F (37.1 C) (10/08 1630) Temp src: Other (Comment) (10/08 1630) BP: 89/42 mmHg (10/08 1630) Pulse Rate: 108  (10/08 1630) Intake/Output from previous day:   Intake/Output from this shift:    Labs:  Basename 10/19/11 1505 10/19/11 1453  WBC -- 18.0*  HGB 12.2 11.0*  PLT -- 363  LABCREA -- --  CREATININE 1.50* 1.32*   Estimated Creatinine Clearance: 50.4 ml/min (by C-G formula based on Cr of 1.5).  Microbiology: No results found for this or any previous visit (from the past 720 hour(s)).  Medical History: Past Medical History  Diagnosis Date  . RENAL INSUFFICIENCY 10/15/2008  . Edema 06/18/2008  . PNEUMONIA 12/01/2007  . CARPAL TUNNEL SYNDROME, BILATERAL 10/30/2007  . HAND PAIN, LEFT 09/04/2007  . Morbid obesity   . ANXIETY 11/08/2006  . HYPERTENSION 11/08/2006  . DIABETES MELLITUS, TYPE II 11/08/2006  . HYPERCHOLESTEROLEMIA 07/24/2009  . ANEMIA 07/24/2009  . COPD 11/08/2006    -HFA 50% p coaching 10/15/2009  . RESPIRATORY FAILURE, CHRONIC 10/15/2009  . BACK PAIN, LUMBAR 03/07/2009  . CHEST PAIN 07/24/2009  . Restless leg syndrome   . Systolic CHF, acute 03/24/2011  . Ischemic cardiomyopathy 03/26/2011   Assessment: 66yo morbidly obese female with COPD and ICM (EF 30-35%) presents to ED with 2 day history of SOB and CP. Given albuterol and lasix with no improvement in symptoms and subsequently intubated. She will begin empiric antibiotics for possible HCAP. Serum creatnine is elevated at 1.5 but this appears to be around her baseline. Given weight will dose using the obesity nomogram.  Goal of Therapy:    Vancomycin trough level 15-20 mcg/ml Appropriate zosyn dosing  Plan:  1) Vancomycin 2.5g IV x 1 then 1750mg  IV q24 2) Zosyn 3.375g IV q8 (4 hour infusion) 3) Check vancomycin trough sooner rather than later 3) Follow renal function, cultures, LOT  Fredrik Rigger 10/19/2011,5:03 PM

## 2011-10-20 ENCOUNTER — Inpatient Hospital Stay (HOSPITAL_COMMUNITY): Payer: Medicare Other

## 2011-10-20 DIAGNOSIS — I509 Heart failure, unspecified: Secondary | ICD-10-CM

## 2011-10-20 DIAGNOSIS — I517 Cardiomegaly: Secondary | ICD-10-CM

## 2011-10-20 DIAGNOSIS — J449 Chronic obstructive pulmonary disease, unspecified: Secondary | ICD-10-CM

## 2011-10-20 LAB — CBC
HCT: 30.7 % — ABNORMAL LOW (ref 36.0–46.0)
Hemoglobin: 9.1 g/dL — ABNORMAL LOW (ref 12.0–15.0)
MCH: 35.5 pg — ABNORMAL HIGH (ref 26.0–34.0)
MCH: 29.1 pg (ref 26.0–34.0)
MCV: 96.5 fL (ref 78.0–100.0)
MCV: 92.2 fL (ref 78.0–100.0)
Platelets: 222 10*3/uL (ref 150–400)
RBC: 2.56 MIL/uL — ABNORMAL LOW (ref 3.87–5.11)
RBC: 3.33 MIL/uL — ABNORMAL LOW (ref 3.87–5.11)

## 2011-10-20 LAB — BLOOD GAS, ARTERIAL
Acid-Base Excess: 7.1 mmol/L — ABNORMAL HIGH (ref 0.0–2.0)
Bicarbonate: 32.1 mEq/L — ABNORMAL HIGH (ref 20.0–24.0)
FIO2: 40 %
O2 Saturation: 95.9 %
RATE: 16 resp/min
pO2, Arterial: 85.5 mmHg (ref 80.0–100.0)

## 2011-10-20 LAB — CARBOXYHEMOGLOBIN
Carboxyhemoglobin: 1.6 % — ABNORMAL HIGH (ref 0.5–1.5)
Methemoglobin: 1.1 % (ref 0.0–1.5)
O2 Saturation: 68.1 %
O2 Saturation: 68.4 %
Total hemoglobin: 10.9 g/dL — ABNORMAL LOW (ref 12.0–16.0)

## 2011-10-20 LAB — GLUCOSE, CAPILLARY
Glucose-Capillary: 117 mg/dL — ABNORMAL HIGH (ref 70–99)
Glucose-Capillary: 205 mg/dL — ABNORMAL HIGH (ref 70–99)
Glucose-Capillary: 97 mg/dL (ref 70–99)

## 2011-10-20 LAB — MRSA PCR SCREENING: MRSA by PCR: POSITIVE — AB

## 2011-10-20 LAB — TROPONIN I
Troponin I: <0.3 ng/mL (ref <0.30)
Troponin I: <0.3 ng/mL (ref <0.30)

## 2011-10-20 LAB — HEPATIC FUNCTION PANEL
AST: 50 U/L — ABNORMAL HIGH (ref 0–37)
Albumin: 2.6 g/dL — ABNORMAL LOW (ref 3.5–5.2)
Alkaline Phosphatase: 50 U/L (ref 39–117)
Total Bilirubin: 0.3 mg/dL (ref 0.3–1.2)
Total Protein: 6.3 g/dL (ref 6.0–8.3)

## 2011-10-20 LAB — BASIC METABOLIC PANEL
CO2: 33 mEq/L — ABNORMAL HIGH (ref 19–32)
Calcium: 8.8 mg/dL (ref 8.4–10.5)
Creatinine, Ser: 1.48 mg/dL — ABNORMAL HIGH (ref 0.50–1.10)
Glucose, Bld: 179 mg/dL — ABNORMAL HIGH (ref 70–99)

## 2011-10-20 LAB — ABO/RH: ABO/RH(D): O POS

## 2011-10-20 MED ORDER — MIDAZOLAM HCL 2 MG/2ML IJ SOLN
1.0000 mg | INTRAMUSCULAR | Status: DC | PRN
  Administered 2011-10-20 – 2011-10-21 (×6): 2 mg via INTRAVENOUS
  Filled 2011-10-20 (×6): qty 2

## 2011-10-20 MED ORDER — CHLORHEXIDINE GLUCONATE 0.12 % MT SOLN
15.0000 mL | Freq: Two times a day (BID) | OROMUCOSAL | Status: DC
  Administered 2011-10-20 – 2011-11-01 (×24): 15 mL via OROMUCOSAL
  Filled 2011-10-20 (×25): qty 15

## 2011-10-20 MED ORDER — FENTANYL CITRATE 0.05 MG/ML IJ SOLN
25.0000 ug | INTRAMUSCULAR | Status: DC | PRN
  Administered 2011-10-20 – 2011-10-21 (×4): 50 ug via INTRAVENOUS
  Filled 2011-10-20 (×4): qty 2

## 2011-10-20 MED ORDER — BIOTENE DRY MOUTH MT LIQD
15.0000 mL | Freq: Four times a day (QID) | OROMUCOSAL | Status: DC
  Administered 2011-10-20 – 2011-11-01 (×42): 15 mL via OROMUCOSAL

## 2011-10-20 NOTE — Progress Notes (Addendum)
Advanced Heart Failure Rounding Note   Subjective:    [name] is a 66 y/o woman with multiple medical problems including morbid obesity, COPD on 4L home O2, systolic CHF with EF 30-35% DM2, HTN, CRI (baseline ~1.50 and HL. She has had multiple admissions for respiratory failure.  Was admitted in 3/13 with dyspnea thought to be due to COPD and CHF. Echo 03/24/11: LVEF 30-35% with mild concentric hypertrophy. Mild MR. LA mildly dilated. Myoview 03/25/11: No evidence for inducible ischemia. Moderate inferolateral and small distal anteroseptal fixed defects are identified and may be related to scar. Inferior and mid/distal inferolateral hypokinesia extending into the apex. Left ventricular ejection fraction of 35%. Cath deferred as she was not candidate for CABG (due to COPD) and not having angina to warrant PCI if lesion was found. Opted for medical therapy.   Came to ER 10/8 for two days of CP and progressive dyspnea. Found to be in respiratory failure. ABG 7.1/120/163/98% on facemask. Intubated in ER. Seen by p/ccm. ROS not available as patient intubated. CXR with mild pulmonary edema. pBNP 9736 (was 7300 in 6/13). WBC 18k. POC troponin < 0.30   Reviewed office weights  05/2010 Weight 273 pounds 10/01/2011 Weight 273 pounds  Being treated for PNA and CHF. Got 80 IV lasix with minimal u/o. 24 hour I/O -803. Weight unchanged. Cardiac markers negative. 10/19/11 Pro BNP 9,786. Procalcitonin 4.24.  Remains intubated but sitting up in chair. Awake alert. Denies CP.        Objective:   Weight Range:  Vital Signs:   Temp:  [98.2 F (36.8 C)-99.8 F (37.7 C)] 99.3 F (37.4 C) (10/09 1230) Pulse Rate:  [89-154] 134  (10/09 1313) Resp:  [12-25] 25  (10/09 1313) BP: (89-215)/(23-154) 104/80 mmHg (10/09 1313) SpO2:  [83 %-100 %] 96 % (10/09 1313) Arterial Line BP: (104-176)/(42-78) 147/57 mmHg (10/09 0900) FiO2 (%):  [40 %-100 %] 40 % (10/09 1314) Weight:  [124 kg (273 lb 5.9 oz)-124.2 kg (273 lb  13 oz)] 124 kg (273 lb 5.9 oz) (10/09 0500)    Weight change: Filed Weights   10/19/11 1509 10/19/11 2130 10/20/11 0500  Weight: 124.2 kg (273 lb 13 oz) 124 kg (273 lb 5.9 oz) 124 kg (273 lb 5.9 oz)    Intake/Output:   Intake/Output Summary (Last 24 hours) at 10/20/11 1408 Last data filed at 10/20/11 1200  Gross per 24 hour  Intake 530.73 ml  Output   1670 ml  Net -1139.27 ml     Physical Exam: CVP 12-13 General: Morbidly obese in NAD on vent  Sitting in chair Neuro: Awake, alert. Follows commands HEENT: Normal except for ETT  Cardiovascular: PMI nonpalpable. Distant HS s1s2 rrr, no m/r/g  Lungs:Mild rhonchi Abdomen: Obese/soft, erythematous (yeast-like) appearance & odor rash under pannus. Nontender. Good BS  Musculoskeletal: No acute deformities  Skin: Intact, rash under pannus (see above), long unkempt toenails  Extremities: TR edema RLE/LLE , RUE art line in place . PAS in place     Telemetry: Sinus Tach  Labs: Basic Metabolic Panel:  Lab 10/20/11 4696 10/19/11 1505 10/19/11 1453  NA 141 140 139  K 4.2 4.7 4.8  CL 100 100 99  CO2 33* -- 32  GLUCOSE 179* 333* 340*  BUN 31* 31* 26*  CREATININE 1.48* 1.50* 1.32*  CALCIUM 8.8 -- 9.3  MG 1.6 -- --  PHOS 4.2 -- --    Liver Function Tests:  Lab 10/20/11 0503 10/19/11 1453  AST 50* 77*  ALT 60* 70*  ALKPHOS 50 66  BILITOT 0.3 0.5  PROT 6.3 7.5  ALBUMIN 2.6* 3.3*   No results found for this basename: LIPASE:5,AMYLASE:5 in the last 168 hours No results found for this basename: AMMONIA:3 in the last 168 hours  CBC:  Lab 10/20/11 0503 10/19/11 1505 10/19/11 1453  WBC 10.3 -- 18.0*  NEUTROABS -- -- 13.4*  HGB 9.1* 12.2 11.0*  HCT 24.7* 36.0 32.0*  MCV 96.5 -- 98.2  PLT 219 -- 363    Cardiac Enzymes:  Lab 10/20/11 0503 10/19/11 2100 10/19/11 1453  CKTOTAL -- -- --  CKMB -- -- --  CKMBINDEX -- -- --  TROPONINI <0.30 <0.30 <0.30    BNP: BNP (last 3 results)  Basename 10/19/11 1453 06/26/11  0526 06/23/11 0538  PROBNP 9736.0* 7258.0* 5844.0*     Other results:     Imaging: Ct Head Wo Contrast  10/19/2011  *RADIOLOGY REPORT*  Clinical Data: Altered mental status  CT HEAD WITHOUT CONTRAST  Technique:  Contiguous axial images were obtained from the base of the skull through the vertex without contrast.  Comparison: 11/24/2004  Findings: Slight motion artifact.  Age-related brain atrophy noted. No acute intracranial hemorrhage, focal edema, definite acute infarction, mass lesion, midline shift, herniation, hydrocephalus, or extra-axial fluid collection.  Gray-white matter differentiation appears maintained.  Cisterns patent.  No cerebellar abnormality. Symmetric orbits.  Mastoids and visualized sinuses clear.  IMPRESSION: No acute intracranial process.   Original Report Authenticated By: Judie Petit. Ruel Favors, M.D.    Portable Chest Xray In Am  10/20/2011  *RADIOLOGY REPORT*  Clinical Data: Edema.  Endotracheal tube.  PORTABLE CHEST - 1 VIEW  Comparison: 10/19/2011.  Findings: Endotracheal tube terminates approximately 1.4 cm above the carina.  Nasogastric tube is followed into the stomach with the tip projecting beyond the inferior boundary of the film.  Left IJ central line tip projects over the SVC.  Heart is enlarged, stable.  Pulmonary arteries appear enlarged as well.  Thoracic aorta is calcified.  Mild diffuse pulmonary parenchymal interstitial prominence and indistinctness. No definite pleural fluid.  IMPRESSION: Pulmonary edema.   Original Report Authenticated By: Reyes Ivan, M.D.    Dg Chest Port 1 View  10/19/2011  *RADIOLOGY REPORT*  Clinical Data: Left central line placement.  PORTABLE CHEST - 1 VIEW  Comparison: 10/19/2011  Findings: Left central line has been placed with the tip in the SVC.  No pneumothorax.  Endotracheal tube is 3 cm above the carina. NG tube enters the stomach.  Cardiomegaly with vascular congestion and mild pulmonary edema.  IMPRESSION: Endotracheal tube  and left central line as above.  Continued CHF pattern, unchanged.   Original Report Authenticated By: Cyndie Chime, M.D.    Dg Chest Portable 1 View  10/19/2011  *RADIOLOGY REPORT*  Clinical Data: Intubated.  PORTABLE CHEST - 1 VIEW  Comparison: 06/30/2011 chest CT and 06/26/2011 chest x-ray.  Findings: Endotracheal tube tip 2.6 cm above the carina.  Cardiomegaly.  Pulmonary edema.  CT detected right upper lobe lesions not adequately addressed by plain film exam.  Please see prior CT report.  Right costophrenic angle not included present exam.  Calcified tortuous aorta.  IMPRESSION: Endotracheal tube tip 2.6 cm above the carina.  Cardiomegaly.  Pulmonary edema.  CT detected right upper lobe lesions not adequately addressed by plain film exam.  Please see prior CT report.   Original Report Authenticated By: Fuller Canada, M.D.      Medications:  Scheduled Medications:    . albuterol  10 mg Nebulization Once  . antiseptic oral rinse  15 mL Mouth Rinse QID  . aspirin  324 mg Oral NOW   Or  . aspirin  300 mg Rectal NOW  . chlorhexidine  15 mL Mouth Rinse BID  . fentaNYL      . fentaNYL  100 mcg Intravenous Once  . fentaNYL  100 mcg Intravenous Once  . ferrous sulfate  325 mg Oral Q breakfast  . fluconazole (DIFLUCAN) IV  200 mg Intravenous Once  . fluconazole (DIFLUCAN) IV  200 mg Intravenous Q24H  . furosemide  80 mg Intravenous Once  . furosemide  80 mg Intravenous BID  . insulin aspart  2-6 Units Subcutaneous Q4H  . ipratropium  0.5 mg Nebulization Once  . lidocaine (cardiac) 100 mg/40ml      . midazolam      . midazolam  4 mg Intravenous Once  . midazolam  6 mg Intravenous Once  . pantoprazole (PROTONIX) IV  40 mg Intravenous QHS  . piperacillin-tazobactam (ZOSYN)  IV  3.375 g Intravenous Q8H  . propofol  5-70 mcg/kg/min Intravenous Once  . rocuronium      . vancomycin  1,750 mg Intravenous Q24H  . vancomycin  2,500 mg Intravenous Once  . DISCONTD: fentaNYL  100 mcg  Intravenous Once  . DISCONTD: midazolam  4 mg Intravenous Once  . DISCONTD: piperacillin-tazobactam (ZOSYN)  IV  3.375 g Intravenous Q6H    Infusions:    . DISCONTD: sodium chloride 50 mL/hr at 10/19/11 1708  . DISCONTD: propofol Stopped (10/20/11 1130)    PRN Medications: sodium chloride, etomidate, fentaNYL, midazolam, succinylcholine, DISCONTD: fentaNYL, DISCONTD: midazolam   Assessment:  1. A/C respiratory failure  2. A/c systolic HF EF 30-35%  3. O2 dependent COPD  4. Chronic renal failure  5. DM2  6. Morbid obesity  7. Chest pain with negative troponins 8. Anemia   Plan/Discussion:    Respiratory status markedly improved despite on mild increase in urine output. Give elevated procalcitonin, I favor an infectious process/PNA with mild overlying HF. CVP still elevated so would continue diuresis as renal function tolerates with goal CVP 8-12.  Negative troponin despite CP and marked respiratory/hemodynamic stress argues against severe underlying CAD (she passed her stress test).   Would continue diuresis and treatment of her underlying lung disease. Would repeat echo once extubated.   Watch H/H seems to have dropped significantly overnight. Will recheck CBC and get Type & Screen now. PCCM following.    Length of Stay: 1 Daniel Bensimhon,MD 2:08 PM

## 2011-10-20 NOTE — Progress Notes (Signed)
Patient is currently active with long term disease management services with [facility] Care Management. Patient will receive a transition of care call and continue monthly visits for assessments and education as indicated upon discharge.  Patient has a long standing history of respiratory instability and is appropriate for long term SNF stay.  She has a excellent relationship with [facility] [address]  Will discuss plan with discharge CM and SW.  For any additional questions or new referrals please contact Anibal Henderson BSN RN [facility] Liaison at [phone].

## 2011-10-20 NOTE — Progress Notes (Signed)
Pt is complaining of mild CP- paged [facility] that is already following her. Waiting for call back.

## 2011-10-20 NOTE — Progress Notes (Signed)
Name: [name] MRN: [id number] DOB: 24-Jul-1945    LOS: 1  Referring Provider:  EDP -Patria Mane Reason for Referral:  Acute Respiratory Failure    PULMONARY / CRITICAL CARE MEDICINE  Events Since Admission: 10/8 - Admit with chest pain, respiratory failure, AMS. Intubated 10/8  Subjective:  Pressors not started 10/8 Tolerating PSV this am   Vital Signs: Temp:  [98.2 F (36.8 C)-99.7 F (37.6 C)] 99.6 F (37.6 C) (10/09 0700) Pulse Rate:  [89-154] 100  (10/09 0731) Resp:  [12-25] 19  (10/09 0731) BP: (89-215)/(23-154) 116/48 mmHg (10/09 0731) SpO2:  [83 %-100 %] 95 % (10/09 0731) Arterial Line BP: (104-176)/(42-78) 104/44 mmHg (10/09 0700) FiO2 (%):  [40 %-100 %] 40 % (10/09 0731) Weight:  [273 lb 5.9 oz (124 kg)-273 lb 13 oz (124.2 kg)] 273 lb 5.9 oz (124 kg) (10/09 0500)  Intake/Output Summary (Last 24 hours) at 10/20/11 1008 Last data filed at 10/20/11 0900  Gross per 24 hour  Intake 437.73 ml  Output   1245 ml  Net -807.27 ml   Physical Examination: General:  Morbidly obese in NAD on vent Neuro:  Sedated, moves ext's spontaneously, follows commands HEENT:  Mm pink/moist, OETT Cardiovascular:  s1s2 rrr, no m/r/g Lungs:  Coarse breath sounds Abdomen:  Obese/soft, erythematous (yeast-like) appearance & odor rash under pannus  Musculoskeletal:  No acute deformities Skin:  Intact, rash under pannus   Active Problems:  Pulmonary edema  Hypoxemia  COPD (chronic obstructive pulmonary disease)   ASSESSMENT AND PLAN  PULMONARY  Lab 10/20/11 0500 10/19/11 1800 10/19/11 1736 10/19/11 1454  PHART 7.377 -- 7.281* 7.121*  PCO2ART 56.4* -- 71.9* 120.1*  PO2ART 85.5 -- 86.0 163.0*  HCO3 32.1* -- 34.0* 38.8*  O2SAT 95.968.1 74.9 95.0 98.0   Ventilator Settings: Vent Mode:  [-] CPAP;PSV FiO2 (%):  [40 %-100 %] 40 % Set Rate:  [10 bmp-16 bmp] 16 bmp Vt Set:  [500 mL] 500 mL PEEP:  [5 cmH20] 5 cmH20 Pressure Support:  [5 cmH20] 5 cmH20 Plateau Pressure:  [9  cmH20-35 cmH20] 9 cmH20 CXR:  10/8 >>>pulmonary edema 10/9 >>>pulm edema, slight improvement from 10/8 ETT:  10/8>>>  A:   Acute Hypercarbic Respiratory Failure COPD - on spiriva, symbicort, BD's at home CHF exacerbation also likely contributing to respiratory failure.  Consider possible HCAP, although clinical picture inconsistent  P:   - wean as tolerated, not a candidate to extubate until CXR improves - abg am improved - PRN sedation protocol   CARDIOVASCULAR  Lab 10/20/11 0503 10/19/11 2100 10/19/11 1454 10/19/11 1453  TROPONINI <0.30 <0.30 -- <0.30  LATICACIDVEN -- -- 1.5 --  PROBNP -- -- -- 9736.0*   ECG:  SR on monitor.  EKG with PVC's, no acute ST changes 10/9: PVC's, sinus tach, no ST changes Lines:  10/8 L IJ TLC>>>  A:  Sinus Tachycardia- in setting of respiratory distress Chest Pain - acute chest pain prior to arrival.  Noted pulmonary edema on cxr.  Hx of HTN - on norvasc, lasix at home  Systolic CHF: EF:30-35%, not candidate for cath or CABG.   P:  Troponin neg x3 Cardiology recommends d/c fluids and continue lasix 80bid, consider ionotropic support to facilitate diuresis  -f/u echo -continue ASA  RENAL  Lab 10/20/11 0503 10/19/11 1505 10/19/11 1453  NA 141 140 139  K 4.2 4.7 --  CL 100 100 99  CO2 33* -- 32  BUN 31* 31* 26*  CREATININE 1.48* 1.50* 1.32*  CALCIUM  8.8 -- 9.3  MG 1.6 -- --  PHOS 4.2 -- --   Intake/Output      10/08 0701 - 10/09 0700 10/09 0701 - 10/10 0700   I.V. (mL/kg) 366.6 (3) 10 (0.1)   IV Piggyback  12.5   Total Intake(mL/kg) 366.6 (3) 22.5 (0.2)   Urine (mL/kg/hr) 1070 (0.4) 75   Other 100    Total Output 1170 75   Net -803.4 -52.5         Foley:  10/8 >>>  A:   Acute Renal Insufficiency on chronic renal insufficiency: baseline Cr: 1.5 -on chronic lasix at home  P:   - continue lasix per cardiology recommendations - monitor urine output.  - BMP in am  GASTROINTESTINAL  Lab 10/20/11 0503 10/19/11 1453  AST  50* 77*  ALT 60* 70*  ALKPHOS 50 66  BILITOT 0.3 0.5  PROT 6.3 7.5  ALBUMIN 2.6* 3.3*    A:   Morbid Obesity Mild Elevation of LFT's -??if medication related- trending down  P:   -LFT's trending down. Repeat CMP in am -consider TF if remaining intubated   HEMATOLOGIC  Lab 10/20/11 0503 10/19/11 1505 10/19/11 1453  HGB 9.1* 12.2 11.0*  HCT 24.7* 36.0 32.0*  PLT 219 -- 363  INR -- -- --  APTT -- -- --   A:   No acute issues.  Hx of Anemia: stable   P:  -monitor cbc -anemia   INFECTIOUS  Lab 10/20/11 0503 10/19/11 1453  WBC 10.3 18.0*  PROCALCITON 4.24 <0.10   Cultures: 10/8 BCx2>>>no growth to date 10/8 Sputum>>>  10/8 UA>>> 10/8 PCT>>>4.24 10/8 urine strep>>>  10/8 urine legionella >>> Antibiotics: Zosyn (?HCAP) 10/8>>> Vanco (?HCAP) 10/8>>> Diflucan (yeast under pannus) 10/8>>>  A:   Bilateral airspace disease -likely edema but difficult to interperet due to body habitus On vanc and zosyn for HCAP P:   -follow cultures, narrow abx when cx available -monitor LFT's while on Diflucan  ENDOCRINE  Lab 10/20/11 0814 10/20/11 0359 10/19/11 2335  GLUCAP 128* 160* 205*   A:   DM - on 70/30 at home 30 units BID    A1C: 7.3 P:   -ICU SSI  NEUROLOGIC 10/8 head CT>>> no acute process A:   AMS - likely related to hypercarbia. Now following commands.  Hx anxiety / depression - on xanax, celexa at home P:   Change propofol to intermittent sedation  BEST PRACTICE / DISPOSITION Level of Care:  ICU Primary Service:  PCCM Consultants:   Code Status:  FULL Diet:  NPO DVT Px:  SCD's until CT head returned neg GI Px:  Protonix Skin Integrity:  Rash as above Social / Family:  None available.     10/20/2011, 8:55 AM [name], M.D. [phone]  CC time 30 minutes  Levy Pupa, MD, PhD 10/20/2011, 10:19 AM [facility] [phone] or if no answer [phone]

## 2011-10-20 NOTE — Progress Notes (Signed)
Called Elink to report rhythm change- looks on the monitor like pt in afib. Did not see history of Afib in PM[facility].

## 2011-10-20 NOTE — Progress Notes (Signed)
  Echocardiogram 2D Echocardiogram has been performed.  [name] 10/20/2011, 3:00 PM

## 2011-10-21 ENCOUNTER — Inpatient Hospital Stay (HOSPITAL_COMMUNITY): Payer: Medicare Other

## 2011-10-21 DIAGNOSIS — I4891 Unspecified atrial fibrillation: Secondary | ICD-10-CM

## 2011-10-21 LAB — TYPE AND SCREEN

## 2011-10-21 LAB — COMPREHENSIVE METABOLIC PANEL
ALT: 45 U/L — ABNORMAL HIGH (ref 0–35)
AST: 30 U/L (ref 0–37)
Albumin: 2.7 g/dL — ABNORMAL LOW (ref 3.5–5.2)
Alkaline Phosphatase: 54 U/L (ref 39–117)
CO2: 35 mEq/L — ABNORMAL HIGH (ref 19–32)
Chloride: 98 mEq/L (ref 96–112)
Creatinine, Ser: 1.75 mg/dL — ABNORMAL HIGH (ref 0.50–1.10)
GFR calc non Af Amer: 29 mL/min — ABNORMAL LOW (ref >90)
Potassium: 3.8 mEq/L (ref 3.5–5.1)
Total Bilirubin: 0.4 mg/dL (ref 0.3–1.2)

## 2011-10-21 LAB — URINALYSIS, ROUTINE W REFLEX MICROSCOPIC
Glucose, UA: NEGATIVE mg/dL
Ketones, ur: NEGATIVE mg/dL
Leukocytes, UA: NEGATIVE
Protein, ur: NEGATIVE mg/dL
Urobilinogen, UA: 0.2 mg/dL (ref 0.0–1.0)

## 2011-10-21 LAB — CBC WITH DIFFERENTIAL/PLATELET
Basophils Absolute: 0 10*3/uL (ref 0.0–0.1)
Basophils Relative: 0 % (ref 0–1)
Eosinophils Absolute: 0 10*3/uL (ref 0.0–0.7)
Eosinophils Relative: 0 % (ref 0–5)
Lymphs Abs: 1.1 10*3/uL (ref 0.7–4.0)
MCH: 29.5 pg (ref 26.0–34.0)
MCHC: 31.2 g/dL (ref 30.0–36.0)
MCV: 94.4 fL (ref 78.0–100.0)
Neutrophils Relative %: 82 % — ABNORMAL HIGH (ref 43–77)
Platelets: 213 10*3/uL (ref 150–400)
RBC: 3.19 MIL/uL — ABNORMAL LOW (ref 3.87–5.11)
RDW: 14.5 % (ref 11.5–15.5)

## 2011-10-21 LAB — GLUCOSE, CAPILLARY
Glucose-Capillary: 115 mg/dL — ABNORMAL HIGH (ref 70–99)
Glucose-Capillary: 151 mg/dL — ABNORMAL HIGH (ref 70–99)
Glucose-Capillary: 115 mg/dL — ABNORMAL HIGH (ref 70–99)

## 2011-10-21 LAB — URINE MICROSCOPIC-ADD ON

## 2011-10-21 LAB — BASIC METABOLIC PANEL
BUN: 45 mg/dL — ABNORMAL HIGH (ref 6–23)
CO2: 36 mEq/L — ABNORMAL HIGH (ref 19–32)
Chloride: 98 mEq/L (ref 96–112)
Glucose, Bld: 221 mg/dL — ABNORMAL HIGH (ref 70–99)
Potassium: 3.6 mEq/L (ref 3.5–5.1)

## 2011-10-21 LAB — CARBOXYHEMOGLOBIN
O2 Saturation: 68.7 %
Total hemoglobin: 9.5 g/dL — ABNORMAL LOW (ref 12.0–16.0)

## 2011-10-21 LAB — PROCALCITONIN: Procalcitonin: 4.28 ng/mL

## 2011-10-21 LAB — HEPARIN LEVEL (UNFRACTIONATED): Heparin Unfractionated: 0.11 IU/mL — ABNORMAL LOW (ref 0.30–0.70)

## 2011-10-21 MED ORDER — AMIODARONE HCL IN DEXTROSE 360-4.14 MG/200ML-% IV SOLN
0.5000 mg/min | INTRAVENOUS | Status: DC
  Administered 2011-10-22 – 2011-10-25 (×7): 0.5 mg/min via INTRAVENOUS
  Filled 2011-10-21 (×17): qty 200

## 2011-10-21 MED ORDER — FUROSEMIDE 10 MG/ML IJ SOLN
40.0000 mg | Freq: Two times a day (BID) | INTRAMUSCULAR | Status: DC
  Administered 2011-10-21: 40 mg via INTRAVENOUS

## 2011-10-21 MED ORDER — METOPROLOL TARTRATE 1 MG/ML IV SOLN
2.5000 mg | Freq: Once | INTRAVENOUS | Status: AC
  Administered 2011-10-21: 2.5 mg via INTRAVENOUS

## 2011-10-21 MED ORDER — HEPARIN BOLUS VIA INFUSION
3000.0000 [IU] | Freq: Once | INTRAVENOUS | Status: AC
  Administered 2011-10-21: 3000 [IU] via INTRAVENOUS
  Filled 2011-10-21: qty 3000

## 2011-10-21 MED ORDER — MUPIROCIN 2 % EX OINT
TOPICAL_OINTMENT | Freq: Two times a day (BID) | CUTANEOUS | Status: DC
  Administered 2011-10-21 – 2011-10-31 (×21): via NASAL
  Administered 2011-11-01: 1 via NASAL
  Filled 2011-10-21: qty 22

## 2011-10-21 MED ORDER — WHITE PETROLATUM GEL
Status: AC
  Administered 2011-10-21: 0.2
  Filled 2011-10-21: qty 5

## 2011-10-21 MED ORDER — AMIODARONE LOAD VIA INFUSION
150.0000 mg | Freq: Once | INTRAVENOUS | Status: AC
  Administered 2011-10-21: 150 mg via INTRAVENOUS
  Filled 2011-10-21: qty 83.34

## 2011-10-21 MED ORDER — BUDESONIDE-FORMOTEROL FUMARATE 160-4.5 MCG/ACT IN AERO
2.0000 | INHALATION_SPRAY | Freq: Two times a day (BID) | RESPIRATORY_TRACT | Status: DC
  Administered 2011-10-21: 2 via RESPIRATORY_TRACT
  Filled 2011-10-21: qty 6

## 2011-10-21 MED ORDER — ALBUTEROL SULFATE (5 MG/ML) 0.5% IN NEBU
2.5000 mg | INHALATION_SOLUTION | RESPIRATORY_TRACT | Status: DC | PRN
  Administered 2011-10-21 – 2011-10-27 (×6): 2.5 mg via RESPIRATORY_TRACT
  Filled 2011-10-21 (×6): qty 0.5

## 2011-10-21 MED ORDER — TIOTROPIUM BROMIDE MONOHYDRATE 18 MCG IN CAPS
18.0000 ug | ORAL_CAPSULE | Freq: Every day | RESPIRATORY_TRACT | Status: DC
  Filled 2011-10-21: qty 5

## 2011-10-21 MED ORDER — HEPARIN BOLUS VIA INFUSION
2500.0000 [IU] | Freq: Once | INTRAVENOUS | Status: AC
  Administered 2011-10-21: 2500 [IU] via INTRAVENOUS
  Filled 2011-10-21: qty 2500

## 2011-10-21 MED ORDER — HEPARIN (PORCINE) IN NACL 100-0.45 UNIT/ML-% IJ SOLN
1750.0000 [IU]/h | INTRAMUSCULAR | Status: DC
  Administered 2011-10-21: 1300 [IU]/h via INTRAVENOUS
  Administered 2011-10-22: 1650 [IU]/h via INTRAVENOUS
  Administered 2011-10-22: 1750 [IU]/h via INTRAVENOUS
  Filled 2011-10-21 (×3): qty 250

## 2011-10-21 MED ORDER — CHLORHEXIDINE GLUCONATE CLOTH 2 % EX PADS
6.0000 | MEDICATED_PAD | Freq: Every day | CUTANEOUS | Status: DC
  Administered 2011-10-21 – 2011-11-01 (×10): 6 via TOPICAL

## 2011-10-21 MED ORDER — AMIODARONE HCL IN DEXTROSE 360-4.14 MG/200ML-% IV SOLN
1.0000 mg/min | INTRAVENOUS | Status: AC
  Administered 2011-10-21 (×2): 1 mg/min via INTRAVENOUS
  Filled 2011-10-21 (×2): qty 200

## 2011-10-21 MED ORDER — METOPROLOL TARTRATE 1 MG/ML IV SOLN
INTRAVENOUS | Status: AC
  Filled 2011-10-21: qty 5

## 2011-10-21 NOTE — Progress Notes (Signed)
ANTICOAGULATION CONSULT NOTE - Initial Consult  Pharmacy Consult for Heparin Indication: atrial fibrillation  Allergies  Allergen Reactions  . Pioglitazone     REACTION: edema  . Rosiglitazone Maleate Nausea And Vomiting    Patient Measurements: Height: 5' 6.93" (170 cm) Weight: 273 lb 5.9 oz (124 kg) IBW/kg (Calculated) : 61.44  Heparin Dosing Weight: 90.96  Vital Signs: Temp: 99.4 F (37.4 C) (10/10 0900) Temp src: Core (Comment) (10/10 0800) BP: 135/92 mmHg (10/10 0830) Pulse Rate: 96  (10/10 0900)  Labs:  Basename 10/21/11 0455 10/20/11 1607 10/20/11 1606 10/20/11 0503 10/19/11 2100 10/19/11 1505  HGB 9.4* 9.7* -- -- -- --  HCT 30.1* 30.7* -- 24.7* -- --  PLT 213 222 -- 219 -- --  APTT -- -- -- -- -- --  LABPROT -- -- -- -- -- --  INR -- -- -- -- -- --  HEPARINUNFRC -- -- -- -- -- --  CREATININE 1.75* -- -- 1.48* -- 1.50*  CKTOTAL -- -- -- -- -- --  CKMB -- -- -- -- -- --  TROPONINI -- -- <0.30 <0.30 <0.30 --    Estimated Creatinine Clearance: 43.1 ml/min (by C-G formula based on Cr of 1.75).   Medical History: Past Medical History  Diagnosis Date  . RENAL INSUFFICIENCY 10/15/2008  . Edema 06/18/2008  . PNEUMONIA 12/01/2007  . CARPAL TUNNEL SYNDROME, BILATERAL 10/30/2007  . HAND PAIN, LEFT 09/04/2007  . Morbid obesity   . ANXIETY 11/08/2006  . HYPERTENSION 11/08/2006  . DIABETES MELLITUS, TYPE II 11/08/2006  . HYPERCHOLESTEROLEMIA 07/24/2009  . ANEMIA 07/24/2009  . COPD 11/08/2006    -HFA 50% p coaching 10/15/2009  . RESPIRATORY FAILURE, CHRONIC 10/15/2009  . BACK PAIN, LUMBAR 03/07/2009  . CHEST PAIN 07/24/2009  . Restless leg syndrome   . Systolic CHF, acute 03/24/2011  . Ischemic cardiomyopathy 03/26/2011    Assessment: 66 yo female with new onset Afib with RVR 10/10. H/H 9.4/30.1- trending down. Platelets ok at  213. No bleeding noted. Not on anticoagulation prior to admission.   Goal of Therapy:  Heparin level 0.3-0.7  Monitor platelets by  anticoagulation protocol: Yes  Plan:  1. Start heparin 3000 units x 1, followed by 1300 units/hr - Giving lower bolus secondary to low hemoglobin and worsening renal function.  2. Get heparin level in 6 hours 3. Daily heparin level and CBC in the morning  Juanita Craver PharmD Candidate 10/21/2011,9:31 AM  Agree with above assessment and plan.   Link Snuffer, PharmD, BCPS Clinical Pharmacist [phone] 10/21/2011, 10:10 AM

## 2011-10-21 NOTE — Care Management Note (Signed)
    Page 1 of 2   11/01/2011     1:43:14 PM   CARE MANAGEMENT NOTE 11/01/2011  Patient:  [name], [name]   Account Number:  [id number]  Date Initiated:  10/20/2011  Documentation initiated by:  [facility]  Subjective/Objective Assessment:   Resp failure - on vent.     Action/Plan:   Anticipated DC Date:  10/27/2011   Anticipated DC Plan:  HOME W HOME HEALTH SERVICES      DC Planning Services  CM consult      Choice offered to / List presented to:             Status of service:  In process, will continue to follow Medicare Important Message given?   (If response is "NO", the following Medicare IM given date fields will be blank) Date Medicare IM given:   Date Additional Medicare IM given:    Discharge Disposition:    Per UR Regulation:  Reviewed for med. necessity/level of care/duration of stay  If discussed at Long Length of Stay Meetings, dates discussed:   10/26/2011    Comments:  ContactLaurell Roof daughter -  [phone]   11-01-11 10:25am Avie Arenas, RNBSN [phone] Patient extubated on 10-19 - is a do not reintubate. Continues on IV heparin - oxgyen at 4 liters.  ?? ltach candidate.  Per previous conversation want patient to go to [facility], Physician states appropriate to go today. Dr. Milas Kocher notified and states preferred patient to go to [facility]. Family notified of physician preference.  Family - son and daughter fine with patient going to [facility].  10-27-11 1:15pm Avie Arenas, RNBSN [phone] Patient reintubaed on 10-11 - ext on 10-13 and reintubated on 10-14.  Talked with son Ronda Fairly and [place] at bedside. Patient awake and alert, writing notes. Talked with rehab options.  All in agreement for [facility] - chose [facility], prior to discharge to SNF when medically ready.  Would like SNF in [place], which is closer to sisters when medically ready.  10-21-11 1:40am Avie Arenas, RNBSN [phone] Patient  extubated today.  Able to talk softly.  Confirmed person listed above is her daughter.  States she does live with her daughter.  States that living condition worked out so,so.  Agreeable to SNF for ST rehab.  Agreeable to go back to [facility] - SW consult placed.

## 2011-10-21 NOTE — Progress Notes (Signed)
ANTICOAGULATION CONSULT NOTE - Follow Up Consult  Pharmacy Consult for Heparin Indication: atrial fibrillation  Allergies  Allergen Reactions  . Pioglitazone     REACTION: edema  . Rosiglitazone Maleate Nausea And Vomiting    Patient Measurements: Height: 5' 6.93" (170 cm) Weight: 273 lb 5.9 oz (124 kg) IBW/kg (Calculated) : 61.44  Heparin Dosing Weight: 91kg  Vital Signs: Temp: 98.8 F (37.1 C) (10/10 1900) Temp src: Core (Comment) (10/10 1900) BP: 117/55 mmHg (10/10 1900) Pulse Rate: 102  (10/10 1900)  Labs:  Basename 10/21/11 1700 10/21/11 0455 10/20/11 1607 10/20/11 1606 10/20/11 0503 10/19/11 2100 10/19/11 1505  HGB -- 9.4* 9.7* -- -- -- --  HCT -- 30.1* 30.7* -- 24.7* -- --  PLT -- 213 222 -- 219 -- --  APTT -- -- -- -- -- -- --  LABPROT -- -- -- -- -- -- --  INR -- -- -- -- -- -- --  HEPARINUNFRC 0.11* -- -- -- -- -- --  CREATININE -- 1.75* -- -- 1.48* -- 1.50*  CKTOTAL -- -- -- -- -- -- --  CKMB -- -- -- -- -- -- --  TROPONINI -- -- -- <0.30 <0.30 <0.30 --    Estimated Creatinine Clearance: 43.1 ml/min (by C-G formula based on Cr of 1.75).   Medications:  Heparin 1300 units/hr  Assessment: 66yof on heparin for new onset Afib. Heparin level (0.11) is subtherapeutic. RN reports no bleeding and no problems with line/infusion. - H/H and Plts trending down - No significant bleeding reported  Goal of Therapy:  Heparin level 0.3-0.7 units/ml Monitor platelets by anticoagulation protocol: Yes   Plan:  1. Heparin IV bolus 2500 units x 1 2. Increase heparin drip to 1650 units/hr (16.5 ml/hr) 3. Check heparin level 6 hours after rate increase  Cleon Dew [phone] 10/21/2011,7:48 PM

## 2011-10-21 NOTE — Progress Notes (Signed)
Spoke with Avie Arenas RN CM this am to notify her that [facility] is on alert to receive patient if possible.  Writer will continue to work with the inpatient team as the patient's disposition develops.  For any additional questions or new referrals please contact Anibal Henderson BSN RN [facility] Liaison at [phone].

## 2011-10-21 NOTE — Progress Notes (Signed)
Dr Adolm Joseph returned page and ordered EKG. Dr. Adolm Joseph at bedside- will follow orders.

## 2011-10-21 NOTE — Clinical Social Work Psychosocial (Signed)
     Clinical Social Work Department BRIEF PSYCHOSOCIAL ASSESSMENT 10/21/2011  Patient:  [name], [name]     Account Number:  [id number]     Admit date:  10/19/2011  Clinical Social Worker:  Robin Searing  Date/Time:  10/21/2011 03:07 PM  Referred by:  Physician  Date Referred:  10/21/2011 Referred for  SNF Placement   Other Referral:   Interview type:  Other - See comment Other interview type:    PSYCHOSOCIAL DATA Living Status:  FAMILY Admitted from facility:   Level of care:   Primary support name:  [name] Primary support relationship to patient:  FAMILY Degree of support available:   GOOD- per RN concern about [name] drinking which patient states she does drink some- "dealing with  [name] lot"- patient says  her [name] is unemployed looking for work- she apparently almost lost her house and per patient they both have had financial struggles but together do ok-    CURRENT CONCERNS Current Concerns  Post-Acute Placement   Other Concerns:    SOCIAL WORK ASSESSMENT / PLAN Patient states she and her [name] live together with three minor grandchildrenb   Assessment/plan status:  Other - See comment Other assessment/ plan:   Patient was recently at [facility] and wants to pursue this again at d/c.  Will complete [facility] and [place] for SNF.   Information/referral to community resources:   SNF  [facility]    PATIENTS/FAMILYS RESPONSE TO PLAN OF CARE: Patient receptive to CSW visit- agreeabele to SNF search.

## 2011-10-21 NOTE — Progress Notes (Signed)
Name: [name] MRN: [id number] DOB: 09/14/1945    LOS: 2  Referring Provider:  Domenick Gong Reason for Referral:  Acute Respiratory Failure    PULMONARY / CRITICAL CARE MEDICINE  Events Since Admission: 10/8 - Admit with chest pain, respiratory failure, AMS. Intubated 10/8 10/09: episode of a-fib with chest discomfort. Started on amiodarone on 10/10  Subjective:  afib overnight. Given metop 2.5mg  x2. Started on amiodarone 10/10  Vital Signs: Temp:  [99.3 F (37.4 C)-101.1 F (38.4 C)] 99.6 F (37.6 C) (10/10 0830) Pulse Rate:  [47-134] 96  (10/10 0830) Resp:  [16-25] 18  (10/10 0830) BP: (99-176)/(44-96) 135/92 mmHg (10/10 0830) SpO2:  [92 %-98 %] 94 % (10/10 0830) Arterial Line BP: (131-147)/(52-57) 131/52 mmHg (10/09 1000) FiO2 (%):  [40 %-40.8 %] 40 % (10/10 0800) Weight:  [273 lb 5.9 oz (124 kg)] 273 lb 5.9 oz (124 kg) (10/10 0500)  Intake/Output Summary (Last 24 hours) at 10/21/11 0847 Last data filed at 10/21/11 0800  Gross per 24 hour  Intake 1179.2 ml  Output   2875 ml  Net -1695.8 ml   Physical Examination: General:  Morbidly obese in NAD on vent Neuro:  Sedated, moves ext's spontaneously, follows commands HEENT:  Mm pink/moist, OETT Cardiovascular:  s1s2 irregularly irregular rhythm in the 110's Lungs:  Diffuse expiratory wheezing Abdomen:  Obese/soft, erythematous (yeast-like) appearance & odor rash under pannus  Musculoskeletal:  No acute deformities Skin:  Intact, rash under pannus   Active Problems:  Pulmonary edema  Hypoxemia  COPD (chronic obstructive pulmonary disease)  Atrial fibrillation   ASSESSMENT AND PLAN  PULMONARY  Lab 10/21/11 0500 10/20/11 1636 10/20/11 0500 10/19/11 1800 10/19/11 1736 10/19/11 1454  PHART -- -- 7.377 -- 7.281* 7.121*  PCO2ART -- -- 56.4* -- 71.9* 120.1*  PO2ART -- -- 85.5 -- 86.0 163.0*  HCO3 -- -- 32.1* -- 34.0* 38.8*  O2SAT 68.7 68.4 95.968.1 74.9 95.0 --   Ventilator Settings: Vent Mode:  [-]  PSV;CPAP FiO2 (%):  [40 %-40.8 %] 40 % Set Rate:  [16 bmp] 16 bmp Vt Set:  [500 mL] 500 mL PEEP:  [5 cmH20] 5 cmH20 Pressure Support:  [5 cmH20-8 cmH20] 5 cmH20 Plateau Pressure:  [6 cmH20-22 cmH20] 22 cmH20 CXR:  10/8 >>>pulmonary edema 10/9 >>>pulm edema, slight improvement from 10/8 10/10: slight improvement of interstitial pulmonary edema ETT:  10/8>>>  A:   Acute Hypercarbic Respiratory Failure COPD - on spiriva, symbicort, BD's at home. Albuterol and ipratoprium stopped 10/09 CHF exacerbation also likely contributing to respiratory failure.  Consider possible HCAP, although clinical picture inconsistent  P:   - currently weaning, improvement of CXR: consider extubation.  - PRN sedation protocol   CARDIOVASCULAR  Lab 10/21/11 0455 10/20/11 1606 10/20/11 0503 10/19/11 2100 10/19/11 1454 10/19/11 1453  TROPONINI -- <0.30 <0.30 <0.30 -- <0.30  LATICACIDVEN -- -- -- -- 1.5 --  PROBNP 13824.0* -- -- -- -- 9736.0*   ECG:  SR on monitor.  EKG with PVC's, no acute ST changes 10/9: PVC's, sinus tach, no ST changes Lines:  10/8 L IJ TLC>>>  Echo: 10/09: Study Conclusions  - Left ventricle: The cavity size was moderately dilated. Wall thickness was normal. Systolic function was mildly to moderately reduced. The estimated ejection fraction was in the range of 40% to 45%. Due to first degree atrioventricular block, there was fusion of early and atrial contributions to ventricular filling. - Left atrium: The atrium was mildly dilated. - Pulmonary arteries: PA peak pressure: 31mm  Hg (S).  A:  A-fib- started on amiodarone 150mg  IV Chest Pain - acute chest pain prior to arrival. Chest pain overnight. Negative trop.  Noted pulmonary edema on cxr.  Hx of HTN - on norvasc, lasix at home  Systolic CHF: EF:40-45%, not candidate for cath or CABG.   P:  Troponin neg x3 Decrease lasix to 40 bid given worsening creatinine. Consider starting po soon Continue amiodarone. Start heparin  10/10, probably xarelto once stabilized -continue ASA  RENAL  Lab 10/21/11 0455 10/20/11 0503 10/19/11 1505 10/19/11 1453  NA 143 141 140 139  K 3.8 4.2 -- --  CL 98 100 100 99  CO2 35* 33* -- 32  BUN 40* 31* 31* 26*  CREATININE 1.75* 1.48* 1.50* 1.32*  CALCIUM 8.8 8.8 -- 9.3  MG -- 1.6 -- --  PHOS -- 4.2 -- --   Intake/Output      10/09 0701 - 10/10 0700 10/10 0701 - 10/11 0700   I.V. (mL/kg) 434.1 (3.5) 10 (0.1)   NG/GT 90 30   IV Piggyback 670 12.5   Total Intake(mL/kg) 1194.1 (9.6) 52.5 (0.4)   Urine (mL/kg/hr) 2800 (0.9) 150   Other     Total Output 2800 150   Net -1605.9 -97.5         Foley:  10/8 >>>  A:   Acute Renal Insufficiency on chronic renal insufficiency: baseline Cr: 1.5 -on chronic lasix at home  P:   - lasix stopped with increasing cr, will add back lower dose 40 mg bid - good urine output. Continue to monitor.  - BMP this pm  GASTROINTESTINAL  Lab 10/21/11 0455 10/20/11 0503 10/19/11 1453  AST 30 50* 77*  ALT 45* 60* 70*  ALKPHOS 54 50 66  BILITOT 0.4 0.3 0.5  PROT 6.6 6.3 7.5  ALBUMIN 2.7* 2.6* 3.3*    A:   Morbid Obesity Mild Elevation of LFT's -??if medication related- trending down  P:   -LFT's trending down. Repeat CMP in am -consider TF if remaining intubated   HEMATOLOGIC  Lab 10/21/11 0455 10/20/11 1607 10/20/11 0503 10/19/11 1505 10/19/11 1453  HGB 9.4* 9.7* 9.1* 12.2 11.0*  HCT 30.1* 30.7* 24.7* 36.0 32.0*  PLT 213 222 219 -- 363  INR -- -- -- -- --  APTT -- -- -- -- --   A:   No acute issues.  Hx of Anemia: stable   P:  -monitor cbc -anemia   INFECTIOUS  Lab 10/21/11 0455 10/20/11 1607 10/20/11 0503 10/19/11 1453  WBC 10.8* 12.2* 10.3 18.0*  PROCALCITON 4.28 -- 4.24 <0.10   Cultures: 10/8 BCx2>>>no growth to date 10/8 Sputum>>> not collected 10/8 UA>>> 10/8 PCT>>>4.24 10/8 urine strep>>> not collected 10/8 urine legionella >>> not collected Antibiotics: Zosyn (?HCAP) 10/8>>> Vanco (?HCAP)  10/8>>> Diflucan (yeast under pannus) 10/8>>>  A:   Bilateral airspace disease -likely edema but difficult to interperet due to body habitus On vanc and zosyn for HCAP P:   -cultures not collected, will d/c vanco, continue zosyn for now and follow clinically  -monitor LFT's while on Diflucan, d/c 10/10  ENDOCRINE  Lab 10/21/11 0817 10/21/11 0341 10/20/11 2340 10/20/11 1957 10/20/11 1602  GLUCAP 127* 115* 108* 132* 117*   A:   DM - on 70/30 at home 30 units BID    A1C: 7.3 P:   -ICU SSI  NEUROLOGIC 10/8 head CT>>> no acute process A:   AMS - likely related to hypercarbia. Now following commands.  Hx anxiety /  depression - on xanax, celexa at home P:   On intermittent sedation.   BEST PRACTICE / DISPOSITION Level of Care:  ICU Primary Service:  PCCM Consultants:  cardiology Code Status:  FULL Diet:  NPO DVT Px:  SCD's >> changing to heparin gtt GI Px:  Protonix Skin Integrity:  Rash as above Social / Family:  None available.    10/21/2011, 8:47 AM [name], M.D. [phone]  CC time 30 minutes  Levy Pupa, MD, PhD 10/21/2011, 8:47 AM [place] [phone] or if no answer [phone]

## 2011-10-21 NOTE — Progress Notes (Addendum)
Advanced Heart Failure Rounding Note   Subjective:    [name] is a 66 y/o woman with multiple medical problems including morbid obesity, COPD on 4L home O2, systolic CHF with EF 30-35% DM2, HTN, CRI (baseline ~1.50 and HL. She has had multiple admissions for respiratory failure.  Was admitted in 3/13 with dyspnea thought to be due to COPD and CHF. Echo 03/24/11: LVEF 30-35% with mild concentric hypertrophy. Mild MR. LA mildly dilated. Myoview 03/25/11: No evidence for inducible ischemia. Moderate inferolateral and small distal anteroseptal fixed defects are identified and may be related to scar. Inferior and mid/distal inferolateral hypokinesia extending into the apex. Left ventricular ejection fraction of 35%. Cath deferred as she was not candidate for CABG (due to COPD) and not having angina to warrant PCI if lesion was found. Opted for medical therapy.   Came to ER 10/8 for two days of CP and progressive dyspnea. Found to be in respiratory failure. ABG 7.1/120/163/98% on facemask. Intubated in ER. Seen by p/ccm. ROS not available as patient intubated. CXR with mild pulmonary edema. pBNP 9736 (was 7300 in 6/13). WBC 18k. POC troponin < 0.30   Reviewed office weights  05/2010 Weight 273 pounds 10/01/2011 Weight 273 pounds  Developed AF with RVR overnight. BP ok. Awake on vent. 40% FiO2. Breathing much improved. Negative about 2L but weight unchanged. CVP down to 11 Denies CP. Eager to get tube out.    Objective:   Weight Range:  Vital Signs:   Temp:  [99.3 F (37.4 C)-101.1 F (38.4 C)] 99.7 F (37.6 C) (10/10 0800) Pulse Rate:  [47-134] 95  (10/10 0800) Resp:  [16-25] 19  (10/10 0800) BP: (99-176)/(44-96) 127/81 mmHg (10/10 0800) SpO2:  [92 %-98 %] 96 % (10/10 0800) Arterial Line BP: (131-147)/(52-57) 131/52 mmHg (10/09 1000) FiO2 (%):  [40 %-40.8 %] 40 % (10/10 0740) Weight:  [124 kg (273 lb 5.9 oz)] 124 kg (273 lb 5.9 oz) (10/10 0500)    Weight change: Filed Weights   10/19/11  2130 10/20/11 0500 10/21/11 0500  Weight: 124 kg (273 lb 5.9 oz) 124 kg (273 lb 5.9 oz) 124 kg (273 lb 5.9 oz)    Intake/Output:   Intake/Output Summary (Last 24 hours) at 10/21/11 0811 Last data filed at 10/21/11 0700  Gross per 24 hour  Intake 1126.7 ml  Output   2725 ml  Net -1598.3 ml     Physical Exam: CVP 12-13 General: Morbidly obese in NAD on vent  Sitting in chair Neuro: Awake, alert. Follows commands HEENT: Normal except for ETT  Cardiovascular: PMI nonpalpable. Distant HS s1s2 irr tachy, no m/r/g  Lungs:Mild rhonchi Abdomen: Obese/soft,  Nontender. Good BS  Musculoskeletal: No acute deformities  Skin: Intact, rash under pannus (see above), long unkempt toenails  Extremities: no edema RLE/LLE , RUE art line in place . PAS in place     Telemetry: Sinus Tach  Labs: Basic Metabolic Panel:  Lab 10/21/11 1610 10/20/11 0503 10/19/11 1505 10/19/11 1453  NA 143 141 140 139  K 3.8 4.2 4.7 4.8  CL 98 100 100 99  CO2 35* 33* -- 32  GLUCOSE 124* 179* 333* 340*  BUN 40* 31* 31* 26*  CREATININE 1.75* 1.48* 1.50* 1.32*  CALCIUM 8.8 8.8 -- 9.3  MG -- 1.6 -- --  PHOS -- 4.2 -- --    Liver Function Tests:  Lab 10/21/11 0455 10/20/11 0503 10/19/11 1453  AST 30 50* 77*  ALT 45* 60* 70*  ALKPHOS 54 50  66  BILITOT 0.4 0.3 0.5  PROT 6.6 6.3 7.5  ALBUMIN 2.7* 2.6* 3.3*   No results found for this basename: LIPASE:5,AMYLASE:5 in the last 168 hours No results found for this basename: AMMONIA:3 in the last 168 hours  CBC:  Lab 10/21/11 0455 10/20/11 1607 10/20/11 0503 10/19/11 1505 10/19/11 1453  WBC 10.8* 12.2* 10.3 -- 18.0*  NEUTROABS 8.9* -- -- -- 13.4*  HGB 9.4* 9.7* 9.1* 12.2 11.0*  HCT 30.1* 30.7* 24.7* 36.0 32.0*  MCV 94.4 92.2 96.5 -- 98.2  PLT 213 222 219 -- 363    Cardiac Enzymes:  Lab 10/20/11 1606 10/20/11 0503 10/19/11 2100 10/19/11 1453  CKTOTAL -- -- -- --  CKMB -- -- -- --  CKMBINDEX -- -- -- --  TROPONINI <0.30 <0.30 <0.30 <0.30     BNP: BNP (last 3 results)  Basename 10/21/11 0455 10/19/11 1453 06/26/11 0526  PROBNP 13824.0* 9736.0* 7258.0*     Other results:     Imaging: Ct Head Wo Contrast  10/19/2011  *RADIOLOGY REPORT*  Clinical Data: Altered mental status  CT HEAD WITHOUT CONTRAST  Technique:  Contiguous axial images were obtained from the base of the skull through the vertex without contrast.  Comparison: 11/24/2004  Findings: Slight motion artifact.  Age-related brain atrophy noted. No acute intracranial hemorrhage, focal edema, definite acute infarction, mass lesion, midline shift, herniation, hydrocephalus, or extra-axial fluid collection.  Gray-white matter differentiation appears maintained.  Cisterns patent.  No cerebellar abnormality. Symmetric orbits.  Mastoids and visualized sinuses clear.  IMPRESSION: No acute intracranial process.   Original Report Authenticated By: Judie Petit. Ruel Favors, M.D.    Dg Chest Port 1 View  10/21/2011  *RADIOLOGY REPORT*  Clinical Data: Pulmonary edema, check ET tube position  PORTABLE CHEST - 1 VIEW  Comparison: Prior chest x-ray yesterday, 10/20/2011  Findings: The patient remains intubated.  Tip of the ET tube is 3 cm superior to the carina.  Left IJ approach central venous catheter is in similar position.  The tip projects over the confluence of the left brachiocephalic and subclavian veins. Incompletely imaged nasogastric tube present.  The tip lies below the diaphragm, likely within the stomach.  Unchanged cardiomegaly.  Slightly improved left suprahilar opacity. Persistent bilateral layering pleural effusions and left greater than right bibasilar opacities.  The main central pulmonary arteries are enlarged.  Mild cephalization and indistinctness of the interstitial markings consistent with interstitial edema.  This appears incrementally improved.  IMPRESSION:  1.  Slightly improved interstitial pulmonary edema and left suprahilar atelectasis. 2.  Stable likely small  bilateral pleural effusions and left greater than right basilar opacities.  Opacities likely reflect pleural fluid combined with atelectasis.  Superimposed infection is difficult to exclude. 3.  Stable cardiomegaly 4.  Stable and satisfactory support apparatus as above.   Original Report Authenticated By: Sterling Big, M.D.    Portable Chest Xray In Am  10/20/2011  *RADIOLOGY REPORT*  Clinical Data: Edema.  Endotracheal tube.  PORTABLE CHEST - 1 VIEW  Comparison: 10/19/2011.  Findings: Endotracheal tube terminates approximately 1.4 cm above the carina.  Nasogastric tube is followed into the stomach with the tip projecting beyond the inferior boundary of the film.  Left IJ central line tip projects over the SVC.  Heart is enlarged, stable.  Pulmonary arteries appear enlarged as well.  Thoracic aorta is calcified.  Mild diffuse pulmonary parenchymal interstitial prominence and indistinctness. No definite pleural fluid.  IMPRESSION: Pulmonary edema.   Original Report Authenticated By: Joanna Hews.  Fredirick Lathe, M.D.    Dg Chest Port 1 View  10/19/2011  *RADIOLOGY REPORT*  Clinical Data: Left central line placement.  PORTABLE CHEST - 1 VIEW  Comparison: 10/19/2011  Findings: Left central line has been placed with the tip in the SVC.  No pneumothorax.  Endotracheal tube is 3 cm above the carina. NG tube enters the stomach.  Cardiomegaly with vascular congestion and mild pulmonary edema.  IMPRESSION: Endotracheal tube and left central line as above.  Continued CHF pattern, unchanged.   Original Report Authenticated By: Cyndie Chime, M.D.    Dg Chest Portable 1 View  10/19/2011  *RADIOLOGY REPORT*  Clinical Data: Intubated.  PORTABLE CHEST - 1 VIEW  Comparison: 06/30/2011 chest CT and 06/26/2011 chest x-ray.  Findings: Endotracheal tube tip 2.6 cm above the carina.  Cardiomegaly.  Pulmonary edema.  CT detected right upper lobe lesions not adequately addressed by plain film exam.  Please see prior CT report.   Right costophrenic angle not included present exam.  Calcified tortuous aorta.  IMPRESSION: Endotracheal tube tip 2.6 cm above the carina.  Cardiomegaly.  Pulmonary edema.  CT detected right upper lobe lesions not adequately addressed by plain film exam.  Please see prior CT report.   Original Report Authenticated By: Fuller Canada, M.D.      Medications:     Scheduled Medications:    . antiseptic oral rinse  15 mL Mouth Rinse QID  . aspirin  324 mg Oral NOW   Or  . aspirin  300 mg Rectal NOW  . chlorhexidine  15 mL Mouth Rinse BID  . ferrous sulfate  325 mg Oral Q breakfast  . fluconazole (DIFLUCAN) IV  200 mg Intravenous Q24H  . furosemide  80 mg Intravenous BID  . insulin aspart  2-6 Units Subcutaneous Q4H  . metoprolol      . metoprolol  2.5 mg Intravenous Once  . pantoprazole (PROTONIX) IV  40 mg Intravenous QHS  . piperacillin-tazobactam (ZOSYN)  IV  3.375 g Intravenous Q8H  . vancomycin  1,750 mg Intravenous Q24H    Infusions:    . DISCONTD: propofol Stopped (10/20/11 1130)    PRN Medications: sodium chloride, fentaNYL, midazolam, DISCONTD: fentaNYL, DISCONTD: midazolam   Assessment:  1. A/C respiratory failure  2. A/c systolic HF EF 30-35%  3. O2 dependent COPD  4. Chronic renal failure  5. DM2  6. Morbid obesity  7. Chest pain with negative troponins 8. Anemia 9. A/C renal failure 10. AF with RVR   Plan/Discussion:    Respiratory status markedly improved despite only mild increase in urine output. I still don't understand completely why she crashed. Give elevated procalcitonin, I favor an infectious process/PNA with mild overlying HF. Negative troponin despite CP and marked respiratory/hemodynamic stress argues against severe underlying CAD (she passed her stress test).  CVP improved. Creatinine worse today so will stop IV diuresis. Can restart po in am.   Echo with EF 40-45%  She is unlikely to tolerate AF well in the long-term. Will start brief  course of amiodarone (being careful with known lung disease). Will also need anti-coagulation. Start heparin. Favor NOAC over coumadin.   Length of Stay: 2 Daniel Bensimhon,MD 8:11 AM

## 2011-10-21 NOTE — Procedures (Signed)
Extubation Procedure Note  Patient Details:   Name: [name] DOB: 1945/03/07 MRN: [id number]   Airway Documentation:     Evaluation  O2 sats: stable throughout Complications: No apparent complications Patient did tolerate procedure well. Bilateral Breath Sounds: Diminished;Rhonchi Suctioning: Airway Yes  Pt extubated per MD order.  Pt tolerated well, RN at bedside.  RT will monitor. O2 sats 97% on 4l [place]  [name], Terie Purser 10/21/2011, 11:36 AM

## 2011-10-21 NOTE — Progress Notes (Signed)
Cross-Cover note: Patient converted to atrial fibrillation around 11:30. Symptomatic with chest discomfort. Chart reviewed. COPD, systolic heart failure now with respiratory failure, intubated.   No wheezing. Reasonable to try and slow rate and may revert to sinus spontaneously. No contraindication to metoprolol, pressure should be able to tolerate. Anxiolytic and pain control may also help (on intermittent sedation for intubation). Hold on amiodarone for now.

## 2011-10-21 NOTE — Progress Notes (Signed)
  Amiodarone Drug - Drug Interaction Consult Note  Recommendations: 1. Fluconazole will be discontinued after 10/10 dose at 1800. Monitor QTc during use of both fluconazole and amiodarone.  Amiodarone is metabolized by the cytochrome P450 system and therefore has the potential to cause many drug interactions. Amiodarone has an average plasma half-life of 50 days (range 20 to 100 days).   There is potential for drug interactions to occur several weeks or months after stopping treatment and the onset of drug interactions may be slow after initiating amiodarone.   []  Statins: Increased risk of myopathy. Simvastatin- restrict dose to 20mg  daily. Other statins: counsel patients to report any muscle pain or weakness immediately.  []  Anticoagulants: Amiodarone can increase anticoagulant effect. Consider warfarin dose reduction. Patients should be monitored closely and the dose of anticoagulant altered accordingly, remembering that amiodarone levels take several weeks to stabilize.  []  Antiepileptics: Amiodarone can increase plasma concentration of phenytoin, phenytoin dose should be reduced. Note that small changes in phenytoin dose can result in large changes in phenytoin levels. Monitor patient closely and counsel on signs of toxicity.  [x]  Beta blockers: increased risk of bradycardia, AV block and myocardial depression. Sotalol - avoid concomitant use. (Patient just received two doses, no longer on).   []   Calcium channel blockers (diltiazem and verapamil): increased risk of bradycardia, AV block and myocardial depression.  []   Cyclosporine: Amiodarone increases levels of cyclosporine. Reduced dose of cyclosporine is recommended.  []  Digoxin dose should be halved when amiodarone is started.  []  Diuretics: increased risk of cardiotoxicity if hypokalemia occurs.  []  Oral hypoglycemic agents (glyburide, glipizide, glimepiride): increased risk of hypoglycemia. Patient's glucose levels should be  monitored closely when initiating amiodarone therapy.   [x]  Drugs that prolong the QT interval: Concurrent therapy is contraindicated due to the increased risk of torsades de pointes; . Antibiotics: e.g. fluoroquinolones, erythromycin. . Antiarrhythmics: e.g. quinidine, procainamide, disopyramide, sotalol. . Antipsychotics: e.g. phenothiazines, haloperidol.  . Lithium, tricyclic antidepressants, and methadone.  Thank You,   Juanita Craver PharmD Candidate  Link Snuffer, PharmD, BCPS Clinical Pharmacist [phone]  10/21/2011 11:11 AM

## 2011-10-22 ENCOUNTER — Inpatient Hospital Stay (HOSPITAL_COMMUNITY): Payer: Medicare Other

## 2011-10-22 DIAGNOSIS — J811 Chronic pulmonary edema: Secondary | ICD-10-CM

## 2011-10-22 LAB — BASIC METABOLIC PANEL WITH GFR
BUN: 48 mg/dL — ABNORMAL HIGH (ref 6–23)
CO2: 35 meq/L — ABNORMAL HIGH (ref 19–32)
Calcium: 8.7 mg/dL (ref 8.4–10.5)
Chloride: 99 meq/L (ref 96–112)
Creatinine, Ser: 1.88 mg/dL — ABNORMAL HIGH (ref 0.50–1.10)
GFR calc Af Amer: 31 mL/min — ABNORMAL LOW
GFR calc non Af Amer: 27 mL/min — ABNORMAL LOW
Glucose, Bld: 115 mg/dL — ABNORMAL HIGH (ref 70–99)
Potassium: 3.5 meq/L (ref 3.5–5.1)
Sodium: 144 meq/L (ref 135–145)

## 2011-10-22 LAB — COMPREHENSIVE METABOLIC PANEL
ALT: 40 U/L — ABNORMAL HIGH (ref 0–35)
AST: 26 U/L (ref 0–37)
CO2: 33 mEq/L — ABNORMAL HIGH (ref 19–32)
Calcium: 8.9 mg/dL (ref 8.4–10.5)
Chloride: 98 mEq/L (ref 96–112)
GFR calc non Af Amer: 30 mL/min — ABNORMAL LOW (ref >90)
Potassium: 3.4 mEq/L — ABNORMAL LOW (ref 3.5–5.1)
Sodium: 143 mEq/L (ref 135–145)

## 2011-10-22 LAB — BLOOD GAS, ARTERIAL
Acid-Base Excess: 5.3 mmol/L — ABNORMAL HIGH (ref 0.0–2.0)
Bicarbonate: 31.2 mEq/L — ABNORMAL HIGH (ref 20.0–24.0)
FIO2: 50 %
O2 Saturation: 94.3 %
PEEP: 5 cmH2O
Patient temperature: 98.6
TCO2: 33.1 mmol/L (ref 0–100)
pO2, Arterial: 82.9 mmHg (ref 80.0–100.0)

## 2011-10-22 LAB — GLUCOSE, CAPILLARY
Glucose-Capillary: 105 mg/dL — ABNORMAL HIGH (ref 70–99)
Glucose-Capillary: 120 mg/dL — ABNORMAL HIGH (ref 70–99)
Glucose-Capillary: 99 mg/dL (ref 70–99)

## 2011-10-22 LAB — LEGIONELLA ANTIGEN, URINE: Legionella Antigen, Urine: NEGATIVE

## 2011-10-22 LAB — HEPARIN LEVEL (UNFRACTIONATED): Heparin Unfractionated: 0.22 IU/mL — ABNORMAL LOW (ref 0.30–0.70)

## 2011-10-22 LAB — CBC
MCH: 31.4 pg (ref 26.0–34.0)
Platelets: 240 10*3/uL (ref 150–400)
RBC: 2.99 MIL/uL — ABNORMAL LOW (ref 3.87–5.11)
WBC: 9.3 10*3/uL (ref 4.0–10.5)

## 2011-10-22 MED ORDER — MIDAZOLAM HCL 2 MG/2ML IJ SOLN
INTRAMUSCULAR | Status: AC
  Administered 2011-10-22: 2 mg
  Filled 2011-10-22: qty 4

## 2011-10-22 MED ORDER — FUROSEMIDE 10 MG/ML IJ SOLN
INTRAMUSCULAR | Status: AC
  Administered 2011-10-22: 80 mg
  Filled 2011-10-22: qty 8

## 2011-10-22 MED ORDER — ROCURONIUM BROMIDE 50 MG/5ML IV SOLN
0.6000 mg/kg | Freq: Once | INTRAVENOUS | Status: AC
  Administered 2011-10-22: 73.1 mg via INTRAVENOUS
  Filled 2011-10-22: qty 7.31

## 2011-10-22 MED ORDER — ROCURONIUM BROMIDE 50 MG/5ML IV SOLN
0.6000 mg/kg | Freq: Once | INTRAVENOUS | Status: AC
  Administered 2011-10-22: 50 mg via INTRAVENOUS
  Filled 2011-10-22: qty 7.31

## 2011-10-22 MED ORDER — FENTANYL CITRATE 0.05 MG/ML IJ SOLN
25.0000 ug | INTRAMUSCULAR | Status: DC | PRN
  Administered 2011-10-22 – 2011-10-30 (×15): 50 ug via INTRAVENOUS
  Filled 2011-10-22 (×19): qty 2

## 2011-10-22 MED ORDER — FENTANYL CITRATE 0.05 MG/ML IJ SOLN
INTRAMUSCULAR | Status: AC
  Administered 2011-10-22: 100 ug via INTRAVENOUS
  Filled 2011-10-22: qty 4

## 2011-10-22 MED ORDER — PROPOFOL 10 MG/ML IV EMUL
5.0000 ug/kg/min | INTRAVENOUS | Status: DC
  Administered 2011-10-22 (×2): 10 ug/kg/min via INTRAVENOUS
  Administered 2011-10-23: 15 ug/kg/min via INTRAVENOUS
  Administered 2011-10-23: 10 ug/kg/min via INTRAVENOUS
  Administered 2011-10-24 (×2): 15 ug/kg/min via INTRAVENOUS
  Filled 2011-10-22 (×6): qty 100

## 2011-10-22 MED ORDER — HEPARIN (PORCINE) IN NACL 100-0.45 UNIT/ML-% IJ SOLN
1800.0000 [IU]/h | INTRAMUSCULAR | Status: DC
  Administered 2011-10-22 – 2011-10-25 (×4): 1950 [IU]/h via INTRAVENOUS
  Administered 2011-10-26: 1800 [IU]/h via INTRAVENOUS
  Filled 2011-10-22 (×11): qty 250

## 2011-10-22 MED ORDER — HEPARIN (PORCINE) IN NACL 100-0.45 UNIT/ML-% IJ SOLN
1750.0000 [IU]/h | INTRAMUSCULAR | Status: DC
  Administered 2011-10-22: 1750 [IU]/h via INTRAVENOUS
  Filled 2011-10-22 (×2): qty 250

## 2011-10-22 MED ORDER — ALPRAZOLAM 0.25 MG PO TABS
0.2500 mg | ORAL_TABLET | Freq: Two times a day (BID) | ORAL | Status: DC | PRN
  Administered 2011-10-22 – 2011-10-30 (×7): 0.25 mg via ORAL
  Filled 2011-10-22 (×10): qty 1

## 2011-10-22 MED ORDER — ETOMIDATE 2 MG/ML IV SOLN
10.0000 mg | Freq: Once | INTRAVENOUS | Status: AC
  Administered 2011-10-22: 20 mg via INTRAVENOUS

## 2011-10-22 NOTE — Progress Notes (Signed)
Pharmacy note- heparin  66 yo female on heparin for afib.  A PAC was placed today and heparin was restarted at about 2:30pm today.  Plan -Restart heparin at 1750 units/hr -Heparin level in 6hrs  Harland German, [place] D 10/22/2011 2:29 PM

## 2011-10-22 NOTE — Significant Event (Signed)
Pt wants something to help relax.  She takes xanax at home.  Will order xanax 0.25 mg bid prn.  Coralyn Helling, MD 10/22/2011, 2:22 AM [phone]

## 2011-10-22 NOTE — Progress Notes (Signed)
[facility] ADULT ICU REPLACEMENT PROTOCOL FOR AM LAB REPLACEMENT ONLY  The patient does not apply for the [facility] Adult ICU Electrolyte Replacment Protocol based on the criteria listed below:   1. Is GFR >/= 50 ml/min? no  Patient's GFR today is  2. Is urine output >/= 0.5 ml/kg/hr for the last 8 hours? no Patient's UOP is  ml/kg/hr 3. Is BUN < 30 mg/dL? no  Patient's BUN today is 45 4. Abnormal electrolyte(s): 5. Ordered repletion with:  6. If a panic level lab has been reported, has the CCM MD in charge been notified? no.   Physician:    Markus Daft A 10/22/2011 4:03 AM

## 2011-10-22 NOTE — Progress Notes (Signed)
Called Dr. Craige Cotta to report K+ 3.4 as order states to report K+<4.0. He is okay with level at this time.

## 2011-10-22 NOTE — Progress Notes (Signed)
ANTICOAGULATION CONSULT NOTE - Follow Up Consult  Pharmacy Consult for heparin Indication: atrial fibrillation  Labs:  Basename 10/22/11 0150 10/21/11 2000 10/21/11 1700 10/21/11 0455 10/20/11 1607 10/20/11 1606 10/20/11 0503 10/19/11 2100  HGB -- -- -- 9.4* 9.7* -- -- --  HCT -- -- -- 30.1* 30.7* -- 24.7* --  PLT -- -- -- 213 222 -- 219 --  APTT -- -- -- -- -- -- -- --  LABPROT -- -- -- -- -- -- -- --  INR -- -- -- -- -- -- -- --  HEPARINUNFRC 0.32 -- 0.11* -- -- -- -- --  CREATININE -- 1.75* -- 1.75* -- -- 1.48* --  CKTOTAL -- -- -- -- -- -- -- --  CKMB -- -- -- -- -- -- -- --  TROPONINI -- -- -- -- -- <0.30 <0.30 <0.30    Assessment: 66yo female now therapeutic on heparin after rate increase though at very low end of goal.  Goal of Therapy:  Heparin level 0.3-0.7 units/ml   Plan:  Will increase heparin gtt by 1 unit/kg/hr to 1750 units/hr to ensure level stays within goal and check level in 6hr.  Colleen Can PharmD BCPS 10/22/2011,2:44 AM

## 2011-10-22 NOTE — Progress Notes (Signed)
ANTIBIOTIC CONSULT NOTE - FOLLOW UP  Pharmacy Consult for Zosyn  Indication: rule out pneumonia  Allergies  Allergen Reactions  . Pioglitazone     REACTION: edema  . Rosiglitazone Maleate Nausea And Vomiting    Patient Measurements: Height: 5\' 6"  (167.6 cm) Weight: 268 lb 11.9 oz (121.9 kg) IBW/kg (Calculated) : 59.3   Vital Signs: Temp: 98.7 F (37.1 C) (10/11 1000) Temp src: Core (Comment) (10/11 0800) BP: 97/55 mmHg (10/11 1119) Pulse Rate: 74  (10/11 1119) Intake/Output from previous day: 10/10 0701 - 10/11 0700 In: 1488.8 [I.V.:1183.8; NG/GT:30; IV Piggyback:275] Out: 2531 [Urine:2530; Stool:1] Intake/Output from this shift: Total I/O In: 117.4 [I.V.:117.4] Out: 325 [Urine:325]  Labs:  [facility] 10/22/11 0300 10/21/11 2000 10/21/11 0455 10/20/11 1607  WBC 9.3 -- 10.8* 12.2*  HGB 9.4* -- 9.4* 9.7*  PLT 240 -- 213 222  LABCREA -- -- -- --  CREATININE 1.73* 1.75* 1.75* --   Estimated Creatinine Clearance: 42.6 ml/min (by C-G formula based on Cr of 1.73).   Microbiology: Recent Results (from the past 720 hour(s))  CULTURE, BLOOD (ROUTINE X 2)     Status: Normal (Preliminary result)   Collection Time   10/19/11  2:50 PM      Component Value Range Status Comment   Specimen Description BLOOD HAND LEFT   Final    Special Requests BOTTLES DRAWN AEROBIC ONLY 10CC   Final    Culture  Setup Time 10/19/2011 21:12   Final    Culture     Final    Value:        BLOOD CULTURE RECEIVED NO GROWTH TO DATE CULTURE WILL BE HELD FOR 5 DAYS BEFORE ISSUING A FINAL NEGATIVE REPORT   Report Status PENDING   Incomplete   CULTURE, BLOOD (ROUTINE X 2)     Status: Normal (Preliminary result)   Collection Time   10/19/11  3:08 PM      Component Value Range Status Comment   Specimen Description BLOOD HAND LEFT   Final    Special Requests BOTTLES DRAWN AEROBIC ONLY 10CC   Final    Culture  Setup Time 10/19/2011 21:12   Final    Culture     Final    Value:        BLOOD CULTURE RECEIVED  NO GROWTH TO DATE CULTURE WILL BE HELD FOR 5 DAYS BEFORE ISSUING A FINAL NEGATIVE REPORT   Report Status PENDING   Incomplete   MRSA PCR SCREENING     Status: Abnormal   Collection Time   10/19/11  9:54 PM      Component Value Range Status Comment   MRSA by PCR POSITIVE (*) NEGATIVE Final     Anti-infectives     Start     Dose/Rate Route Frequency Ordered Stop   10/20/11 1800   fluconazole (DIFLUCAN) IVPB 200 mg        200 mg 100 mL/hr over 60 Minutes Intravenous Every 24 hours 10/19/11 2113 10/21/11 1840   10/20/11 1730   vancomycin (VANCOCIN) 1,750 mg in sodium chloride 0.9 % 500 mL IVPB  Status:  Discontinued        1,750 mg 250 mL/hr over 120 Minutes Intravenous Every 24 hours 10/19/11 2113 10/21/11 1045   10/20/11 0100  piperacillin-tazobactam (ZOSYN) IVPB 3.375 g       3.375 g 12.5 mL/hr over 240 Minutes Intravenous 3 times per day 10/19/11 2113     10/19/11 1800   piperacillin-tazobactam (ZOSYN) IVPB 3.375 g  Status:  Discontinued        3.375 g 12.5 mL/hr over 240 Minutes Intravenous 4 times per day 10/19/11 1658 10/19/11 1717   10/19/11 1730   fluconazole (DIFLUCAN) IVPB 200 mg        200 mg 100 mL/hr over 60 Minutes Intravenous  Once 10/19/11 1708 10/19/11 1951   10/19/11 1715   vancomycin (VANCOCIN) 2,500 mg in sodium chloride 0.9 % 500 mL IVPB        2,500 mg 250 mL/hr over 120 Minutes Intravenous  Once 10/19/11 1715 10/20/11 0026          Assessment: 66yo morbidly obese female with COPD and ICM (EF 30-35%) presents to ED with 2 day history of SOB and CP. WBC WNL and patient is afebrile, procalcitonin 4.28. Concern for pneumonia. Pharmacy consulted for Zosyn dosing.  10/11 CXR: cardiomegaly, pulmonary edema, bilateral opacities. Pleural effusion and infectious infiltrates cannot be excluded.  10/8 Zosyn >> 10/8 Vanco >> 10/10 10/8 Fluconazole >>10/10  10/8 MRSA PCR >> Pos 10/8 Bld >>NGTD 10/10 Urine strep pneumo >>Neg  Goal of Therapy:  Appropriate  Zosyn dosing  Plan:  1. Continue Zosyn 3.375g IV q8hr (4 hour infusion) 2. Follow renal function, cultures, LOT  Juanita Craver PharmD Candidate 10/22/2011,11:40 AM   I have reviewed the above and agree with the plan.  Harland German, Pharm D 10/22/2011 12:45 PM

## 2011-10-22 NOTE — Progress Notes (Signed)
INITIAL ADULT NUTRITION ASSESSMENT Date: 10/22/2011   Time: 3:21 PM  Reason for Assessment: VDRF  INTERVENTION:  Diet clarification, NPO while intubated.  If unable to extubate in the next 24 hours, recommend start TF via OG or NG tube with Jevity 1.2 at 20 ml/h (goal rate) with Prostat 60 ml TID to provide 1176 kcals, 117 gm protein, 389 ml free water daily.  Above TF regimen plus Propofol will provide a total of 1374 kcals (23 kcals/kg IBW).    DOCUMENTATION CODES Per approved criteria  -Morbid Obesity   ASSESSMENT: Female 66 y.o.  Dx: Acute Respiratory Failure  Hx:  Past Medical History  Diagnosis Date  . RENAL INSUFFICIENCY 10/15/2008  . Edema 06/18/2008  . PNEUMONIA 12/01/2007  . CARPAL TUNNEL SYNDROME, BILATERAL 10/30/2007  . HAND PAIN, LEFT 09/04/2007  . Morbid obesity   . ANXIETY 11/08/2006  . HYPERTENSION 11/08/2006  . DIABETES MELLITUS, TYPE II 11/08/2006  . HYPERCHOLESTEROLEMIA 07/24/2009  . ANEMIA 07/24/2009  . COPD 11/08/2006    -HFA 50% p coaching 10/15/2009  . RESPIRATORY FAILURE, CHRONIC 10/15/2009  . BACK PAIN, LUMBAR 03/07/2009  . CHEST PAIN 07/24/2009  . Restless leg syndrome   . Systolic CHF, acute 03/24/2011  . Ischemic cardiomyopathy 03/26/2011    Past Surgical History  Procedure Date  . Abdominal hysterectomy 1987    Related Meds:  Scheduled Meds:   . antiseptic oral rinse  15 mL Mouth Rinse QID  . chlorhexidine  15 mL Mouth Rinse BID  . Chlorhexidine Gluconate Cloth  6 each Topical Q0600  . etomidate  10 mg Intravenous Once  . fentaNYL      . ferrous sulfate  325 mg Oral Q breakfast  . fluconazole (DIFLUCAN) IV  200 mg Intravenous Q24H  . furosemide      . heparin  2,500 Units Intravenous Once  . insulin aspart  2-6 Units Subcutaneous Q4H  . midazolam      . mupirocin ointment   Nasal BID  . pantoprazole (PROTONIX) IV  40 mg Intravenous QHS  . piperacillin-tazobactam (ZOSYN)  IV  3.375 g Intravenous Q8H  . rocuronium  0.6 mg/kg  Intravenous Once  . rocuronium  0.6 mg/kg Intravenous Once  . DISCONTD: budesonide-formoterol  2 puff Inhalation BID  . DISCONTD: furosemide  40 mg Intravenous BID  . DISCONTD: tiotropium  18 mcg Inhalation Daily   Continuous Infusions:   . amiodarone (NEXTERONE PREMIX) 360 mg/200 mL dextrose 0.5 mg/min (10/22/11 1513)  . heparin 1,750 Units/hr (10/22/11 1425)  . propofol 40 mcg/kg/min (10/22/11 1025)  . DISCONTD: heparin 1,750 Units/hr (10/22/11 0250)   PRN Meds:.sodium chloride, albuterol, ALPRAZolam, fentaNYL   Ht: 5\' 6"  (167.6 cm)  Wt: 268 lb 11.9 oz (121.9 kg)  Ideal Wt: 59.1 kg % Ideal Wt: 206%  Usual Wt:  Wt Readings from Last 10 Encounters:  10/22/11 268 lb 11.9 oz (121.9 kg)  10/01/11 273 lb 12 oz (124.172 kg)  07/02/11 263 lb 6.4 oz (119.477 kg)  05/24/11 269 lb (122.018 kg)  05/03/11 265 lb 12 oz (120.543 kg)  03/31/11 259 lb 11.2 oz (117.8 kg)  12/01/10 253 lb 1.4 oz (114.8 kg)  10/30/10 270 lb 3.2 oz (122.562 kg)  10/15/10 274 lb (124.286 kg)  10/15/10 274 lb (124.286 kg)   % Usual Wt: 100%  Body mass index is 43.38 kg/(m^2). Extreme obesity, class III  Food/Nutrition Related Hx: No nutrition problems identified on admission nutrition screen.  Labs:  CMP     Component  Value Date/Time   NA 143 10/22/2011 0300   K 3.4* 10/22/2011 0300   CL 98 10/22/2011 0300   CO2 33* 10/22/2011 0300   GLUCOSE 128* 10/22/2011 0300   BUN 45* 10/22/2011 0300   CREATININE 1.73* 10/22/2011 0300   CALCIUM 8.9 10/22/2011 0300   CALCIUM 9.1 09/03/2010 1643   PROT 6.9 10/22/2011 0300   ALBUMIN 2.7* 10/22/2011 0300   AST 26 10/22/2011 0300   ALT 40* 10/22/2011 0300   ALKPHOS 64 10/22/2011 0300   BILITOT 0.3 10/22/2011 0300   GFRNONAA 30* 10/22/2011 0300   GFRAA 34* 10/22/2011 0300    CBG (last 3)   Basename 10/22/11 1155 10/22/11 0808 10/22/11 0352  GLUCAP 147* 159* 132*     Intake/Output Summary (Last 24 hours) at 10/22/11 1527 Last data filed at 10/22/11  1000  Gross per 24 hour  Intake 1184.57 ml  Output   1831 ml  Net -646.43 ml    Diet Order: Heart Healthy   IVF:    amiodarone (NEXTERONE PREMIX) 360 mg/200 mL dextrose Last Rate: 0.5 mg/min (10/22/11 1513)  heparin Last Rate: 1,750 Units/hr (10/22/11 1425)  propofol Last Rate: 40 mcg/kg/min (10/22/11 1025)  DISCONTD: heparin Last Rate: 1,750 Units/hr (10/22/11 0250)    Estimated Nutritional Needs:   Kcal: 1976 Protein: 110-120 gm Fluid: 2-2.2  Patient was admitted with respiratory failure, intubated 10/8; extubated 10/10 and re-intubated this morning.  Patient remains intubated on ventilator support.  MV: 9 Temp:Temp (24hrs), Avg:99.4 F (37.4 C), Min:98.6 F (37 C), Max:100.3 F (37.9 C)  Propofol at 7.5 ml/h providing 198 kcals/day.  NUTRITION DIAGNOSIS: -Inadequate oral intake (NI-2.1).  Status: Ongoing  RELATED TO: inability to eat  AS EVIDENCED BY: NPO status  MONITORING/EVALUATION(Goals):  Goal:  Enteral nutrition to provide 60-70% of estimated calorie needs (22-25 kcals/kg ideal body weight) and >/= 90% of estimated protein needs, based on ASPEN guidelines for permissive underfeeding in critically ill obese individuals.  Monitor:  TF initiation and tolerance vs. Diet advancement if extubated, labs, weight trend, I/O  EDUCATION NEEDS: -Education not appropriate at this time   Joaquin Courts, RD, LDN, CNSC Pager# [phone] After Hours Pager# [phone]  10/22/2011, 3:21 PM

## 2011-10-22 NOTE — Progress Notes (Signed)
Had to stop tx. Due to pt. Increase in HR.pt. HR went into the 130"s to 140's.

## 2011-10-22 NOTE — Significant Event (Signed)
Pt with increased WOB and decreased SpO2.  Will try transition from nasal cannula to venti mask.  If no improvement, then will try NIPPV.  Coralyn Helling, MD 10/22/2011, 6:57 AM [phone]

## 2011-10-22 NOTE — Progress Notes (Signed)
Advanced Heart Failure Rounding Note   Subjective:    Ms. Fauerbach is a 66 y/o woman with multiple medical problems including morbid obesity, COPD on 4L home O2, systolic CHF with EF 30-35% DM2, HTN, CRI (baseline ~1.50 and HL. She has had multiple admissions for respiratory failure.  Was admitted in 3/13 with dyspnea thought to be due to COPD and CHF. Echo 03/24/11: LVEF 30-35% with mild concentric hypertrophy. Mild MR. LA mildly dilated. Myoview 03/25/11: No evidence for inducible ischemia. Moderate inferolateral and small distal anteroseptal fixed defects are identified and may be related to scar. Inferior and mid/distal inferolateral hypokinesia extending into the apex. Left ventricular ejection fraction of 35%. Cath deferred as she was not candidate for CABG (due to COPD) and not having angina to warrant PCI if lesion was found. Opted for medical therapy.   Came to ER 10/8 for two days of CP and progressive dyspnea. Found to be in respiratory failure. ABG 7.1/120/163/98% on facemask. Intubated in ER. Seen by p/ccm. ROS not available as patient intubated. CXR with mild pulmonary edema. pBNP 9736 (was 7300 in 6/13). WBC 18k. POC troponin < 0.30 (Baseline weight 273)  Started on amio yesterday for AF - converted to sinus. Lasix held due to rising creatinine (CVP 10-11). Extubated yesterday but developed increased WOB this am with increased HR and BP. Being reintubated now.      Objective:   Weight Range:  Vital Signs:   Temp:  [98.6 F (37 C)-100.3 F (37.9 C)] 99.4 F (37.4 C) (10/11 0700) Pulse Rate:  [39-141] 118  (10/11 0806) Resp:  [18-24] 18  (10/11 0806) BP: (94-164)/(41-99) 164/77 mmHg (10/11 0806) SpO2:  [86 %-99 %] 92 % (10/11 0806) Arterial Line BP: (121-154)/(59-72) 121/60 mmHg (10/10 1200) FiO2 (%):  [50 %] 50 % (10/11 0806) Weight:  [121.9 kg (268 lb 11.9 oz)] 121.9 kg (268 lb 11.9 oz) (10/11 0500)    Weight change: Filed Weights   10/20/11 0500 10/21/11 0500 10/22/11  0500  Weight: 124 kg (273 lb 5.9 oz) 124 kg (273 lb 5.9 oz) 121.9 kg (268 lb 11.9 oz)    Intake/Output:   Intake/Output Summary (Last 24 hours) at 10/22/11 0824 Last data filed at 10/22/11 0700  Gross per 24 hour  Intake 1436.27 ml  Output   2381 ml  Net -944.73 ml     Physical Exam: CVP 12-13 General: Morbidly obese. Tachypneic. Getting re-intubated  Sitting in chair Neuro: Awake, alert. Follows commands HEENT: Normal except for ETT  Cardiovascular: PMI nonpalpable. Distant HS s1s2 irr tachy, no m/r/g  Lungs:Mild rhonchi Abdomen: Obese/soft,  Nontender. Good BS  Musculoskeletal: No acute deformities  Skin: Intact, rash under pannus (see above), long unkempt toenails  Extremities: no edema RLE/LLE , RUE art line in place . PAS in place     Telemetry: Sinus Tach  Labs: Basic Metabolic Panel:  Lab 10/22/11 8119 10/21/11 2000 10/21/11 0455 10/20/11 0503 10/19/11 1505 10/19/11 1453  NA 143 143 143 141 140 --  K 3.4* 3.6 3.8 4.2 4.7 --  CL 98 98 98 100 100 --  CO2 33* 36* 35* 33* -- 32  GLUCOSE 128* 221* 124* 179* 333* --  BUN 45* 45* 40* 31* 31* --  CREATININE 1.73* 1.75* 1.75* 1.48* 1.50* --  CALCIUM 8.9 8.9 8.8 -- -- --  MG -- -- -- 1.6 -- --  PHOS -- -- -- 4.2 -- --    Liver Function Tests:  Lab 10/22/11 0300 10/21/11 0455 10/20/11  0503 10/19/11 1453  AST 26 30 50* 77*  ALT 40* 45* 60* 70*  ALKPHOS 64 54 50 66  BILITOT 0.3 0.4 0.3 0.5  PROT 6.9 6.6 6.3 7.5  ALBUMIN 2.7* 2.7* 2.6* 3.3*   No results found for this basename: LIPASE:5,AMYLASE:5 in the last 168 hours No results found for this basename: AMMONIA:3 in the last 168 hours  CBC:  Lab 10/22/11 0300 10/21/11 0455 10/20/11 1607 10/20/11 0503 10/19/11 1505 10/19/11 1453  WBC 9.3 10.8* 12.2* 10.3 -- 18.0*  NEUTROABS -- 8.9* -- -- -- 13.4*  HGB 9.4* 9.4* 9.7* 9.1* 12.2 --  HCT 28.6* 30.1* 30.7* 24.7* 36.0 --  MCV 95.7 94.4 92.2 96.5 -- 98.2  PLT 240 213 222 219 -- 363    Cardiac Enzymes:  Lab  10/20/11 1606 10/20/11 0503 10/19/11 2100 10/19/11 1453  CKTOTAL -- -- -- --  CKMB -- -- -- --  CKMBINDEX -- -- -- --  TROPONINI <0.30 <0.30 <0.30 <0.30    BNP: BNP (last 3 results)  Basename 10/21/11 0455 10/19/11 1453 06/26/11 0526  PROBNP 13824.0* 9736.0* 7258.0*     Other results:     Imaging: Dg Chest Port 1 View  10/22/2011  *RADIOLOGY REPORT*  Clinical Data: Difficulty breathing.  PORTABLE CHEST - 1 VIEW  Comparison: 10/21/2011.  Findings: Endotracheal tube and nasogastric tube have been removed. Left central line unchanged in position.  Cardiomegaly.  Pulmonary vascular congestion/pulmonary edema most notable centrally.  Additionally, consolidation lung bases. Although this may represent changes pulmonary edema, infectious infiltrate not excluded in the proper clinical setting.  Pleural effusion cannot be excluded particularly the left.  No gross pneumothorax.  Prominence hilar structures stable.  Calcified aorta.  IMPRESSION:  Cardiomegaly.  Pulmonary vascular congestion/pulmonary edema most notable centrally.  Additionally, consolidation lung bases.  Although this may represent changes  of pulmonary edema, infectious infiltrate not excluded in the proper clinical setting.  Pleural effusion cannot be excluded particularly on the left.   Original Report Authenticated By: Fuller Canada, M.D.    Dg Chest Port 1 View  10/21/2011  *RADIOLOGY REPORT*  Clinical Data: Pulmonary edema, check ET tube position  PORTABLE CHEST - 1 VIEW  Comparison: Prior chest x-ray yesterday, 10/20/2011  Findings: The patient remains intubated.  Tip of the ET tube is 3 cm superior to the carina.  Left IJ approach central venous catheter is in similar position.  The tip projects over the confluence of the left brachiocephalic and subclavian veins. Incompletely imaged nasogastric tube present.  The tip lies below the diaphragm, likely within the stomach.  Unchanged cardiomegaly.  Slightly improved left  suprahilar opacity. Persistent bilateral layering pleural effusions and left greater than right bibasilar opacities.  The main central pulmonary arteries are enlarged.  Mild cephalization and indistinctness of the interstitial markings consistent with interstitial edema.  This appears incrementally improved.  IMPRESSION:  1.  Slightly improved interstitial pulmonary edema and left suprahilar atelectasis. 2.  Stable likely small bilateral pleural effusions and left greater than right basilar opacities.  Opacities likely reflect pleural fluid combined with atelectasis.  Superimposed infection is difficult to exclude. 3.  Stable cardiomegaly 4.  Stable and satisfactory support apparatus as above.   Original Report Authenticated By: Sterling Big, M.D.      Medications:     Scheduled Medications:    . amiodarone  150 mg Intravenous Once  . antiseptic oral rinse  15 mL Mouth Rinse QID  . budesonide-formoterol  2 puff Inhalation  BID  . chlorhexidine  15 mL Mouth Rinse BID  . Chlorhexidine Gluconate Cloth  6 each Topical Q0600  . fentaNYL      . ferrous sulfate  325 mg Oral Q breakfast  . fluconazole (DIFLUCAN) IV  200 mg Intravenous Q24H  . furosemide      . heparin  2,500 Units Intravenous Once  . heparin  3,000 Units Intravenous Once  . insulin aspart  2-6 Units Subcutaneous Q4H  . metoprolol      . midazolam      . mupirocin ointment   Nasal BID  . pantoprazole (PROTONIX) IV  40 mg Intravenous QHS  . piperacillin-tazobactam (ZOSYN)  IV  3.375 g Intravenous Q8H  . tiotropium  18 mcg Inhalation Daily  . white petrolatum      . DISCONTD: furosemide  40 mg Intravenous BID  . DISCONTD: vancomycin  1,750 mg Intravenous Q24H    Infusions:    . amiodarone (NEXTERONE PREMIX) 360 mg/200 mL dextrose 1 mg/min (10/21/11 1320)   Followed by  . amiodarone (NEXTERONE PREMIX) 360 mg/200 mL dextrose 0.5 mg/min (10/22/11 0012)  . DISCONTD: heparin 1,750 Units/hr (10/22/11 0250)    PRN  Medications: sodium chloride, albuterol, ALPRAZolam, DISCONTD: fentaNYL, DISCONTD: midazolam   Assessment:  1. A/C respiratory failure  2. A/c systolic HF EF 30-35%  3. O2 dependent COPD  4. Chronic renal failure  5. DM2  6. Morbid obesity  7. Chest pain with negative troponins 8. Anemia 9. A/C renal failure 10. AF with RVR   Plan/Discussion:     She now has recurrent respiratory failure. This is clearly multifactorial due to severe COPD, obesity hypoventilation syndrome, anxiety and diastolic/systolic HF. (EF 40-45%)  I thought her volume status was pretty good yesterday  (CVP down and CXR clear) but now with recurrent edema. I wonder if her PCWP goes up as she gets more anxious and HTNive.   Have discussed with pccm team and will place PA catheter to further manage volume status particularly given tenuous renal function.  We also discussed possibility of ischemic heart disease but she has tolerated significant hemodynamic stress without bumping troponin at all and thus I think possibility of high-grade CAD less likely and will defer cath for now as her renal function is tenuous and she is not CABG cnadidate. May need to reconsider at some point.  She is unlikely to tolerate AF well  in the long-term. Will continue with brief course of amiodarone (being careful with known lung disease). Will also need anti-coagulation. Continue heparin. Favor NOAC over coumadin at d/c  Length of Stay: 3 Chevella Pearce,MD 8:24 AM

## 2011-10-22 NOTE — Progress Notes (Signed)
PT Cancellation/Discharge Note  Patient Details Name: [name] MRN: [id number] DOB: 29-Jan-1945   Cancelled Treatment:    Reason Eval/Treat Not Completed: Patient not medically ready. Pt reintubated.  Please re-order when appropriate.   Tamario Heal 10/22/2011, 10:45 AM

## 2011-10-22 NOTE — Progress Notes (Signed)
ANTICOAGULATION CONSULT NOTE - Follow Up Consult  Pharmacy Consult for heparin Indication: atrial fibrillation  Labs:  Basename 10/22/11 1915 10/22/11 1841 10/22/11 0300 10/22/11 0150 10/21/11 2000 10/21/11 1700 10/21/11 0455 10/20/11 1607 10/20/11 1606 10/20/11 0503 10/19/11 2100  HGB -- -- 9.4* -- -- -- 9.4* -- -- -- --  HCT -- -- 28.6* -- -- -- 30.1* 30.7* -- -- --  PLT -- -- 240 -- -- -- 213 222 -- -- --  APTT -- -- -- -- -- -- -- -- -- -- --  LABPROT -- -- -- -- -- -- -- -- -- -- --  INR -- -- -- -- -- -- -- -- -- -- --  HEPARINUNFRC 0.22* -- -- 0.32 -- 0.11* -- -- -- -- --  CREATININE -- 1.88* 1.73* -- 1.75* -- -- -- -- -- --  CKTOTAL -- -- -- -- -- -- -- -- -- -- --  CKMB -- -- -- -- -- -- -- -- -- -- --  TROPONINI -- -- -- -- -- -- -- -- <0.30 <0.30 <0.30    Assessment: 66yo female on heparin for afib. Level was therapeutic today but held then restart this afternoon. Level now subtherapeutic after restart.   Goal of Therapy:  Heparin level 0.3-0.7 units/ml   Plan:   Increase heparin to 1950 units/hr F/u with AM level

## 2011-10-22 NOTE — Progress Notes (Deleted)
[facility] ADULT ICU REPLACEMENT PROTOCOL FOR AM LAB REPLACEMENT ONLY  The patient does not apply for the [facility] Adult ICU Electrolyte Replacment Protocol based on the criteria listed below:   1. Is GFR >/= 50 ml/min? no  Patient's GFR today is 36 2. Is urine output >/= 0.5 ml/kg/hr for the last 8 hours? no Patient's UOP is  ml/kg/hr 3. Is BUN < 30 mg/dL? no  Patient's BUN today is 31 4. Abnormal electrolyte(s): 5. Ordered repletion with:   6. If a panic level lab has been reported, has the CCM MD in charge been notified? no.   Physician:    Markus Daft A 10/22/2011 5:41 AM

## 2011-10-22 NOTE — Progress Notes (Signed)
Name: [name] MRN: [id number] DOB: 1945/12/24    LOS: 3  Referring Provider:  Domenick Gong Reason for Referral:  Acute Respiratory Failure    PULMONARY / CRITICAL CARE MEDICINE  Events Since Admission: 10/8 - Admit with chest pain, respiratory failure, AMS. Intubated 10/8 10/09: episode of a-fib with chest discomfort. Started on amiodarone on 10/10 10/10: extubated  10/11: reintubated  Subjective:  A-fib in am 10/11 with worsening respiratory failure. Put on Bipap with increased work of breathing. Intubated 10/11 Lasix stopped 10/10 pm   Vital Signs: Temp:  [98.6 F (37 C)-100.3 F (37.9 C)] 99.5 F (37.5 C) (10/11 0500) Pulse Rate:  [39-124] 99  (10/11 0500) Resp:  [18-23] 23  (10/11 0500) BP: (94-149)/(41-92) 139/60 mmHg (10/11 0500) SpO2:  [93 %-99 %] 93 % (10/11 0500) Arterial Line BP: (121-154)/(59-72) 121/60 mmHg (10/10 1200) FiO2 (%):  [40 %] 40 % (10/10 0800) Weight:  [268 lb 11.9 oz (121.9 kg)] 268 lb 11.9 oz (121.9 kg) (10/11 0500)  Intake/Output Summary (Last 24 hours) at 10/22/11 5784 Last data filed at 10/22/11 0500  Gross per 24 hour  Intake 1420.37 ml  Output   2431 ml  Net -1010.63 ml   Physical Examination: General:  Morbidly obese in NAD on vent Neuro:  Sedated, moves ext's spontaneously, follows commands HEENT:  Mm pink/moist, OETT Cardiovascular:  S1s2, afib Lungs:  Coarse breath sounds Abdomen:  Obese/soft, erythematous (yeast-like) appearance & odor rash under pannus  Musculoskeletal:  No acute deformities Skin:  Intact, rash under pannus   Active Problems:  Pulmonary edema  Hypoxemia  COPD (chronic obstructive pulmonary disease)  Atrial fibrillation   ASSESSMENT AND PLAN  PULMONARY  Lab 10/22/11 0513 10/21/11 0500 10/20/11 1636 10/20/11 0500 10/19/11 1800 10/19/11 1736 10/19/11 1454  PHART -- -- -- 7.377 -- 7.281* 7.121*  PCO2ART -- -- -- 56.4* -- 71.9* 120.1*  PO2ART -- -- -- 85.5 -- 86.0 163.0*  HCO3 -- -- -- 32.1* --  34.0* 38.8*  O2SAT 53.0 68.7 68.4 95.968.1 74.9 -- --   Ventilator Settings: Vent Mode:  [-] PSV;CPAP FiO2 (%):  [40 %] 40 % PEEP:  [5 cmH20] 5 cmH20 Pressure Support:  [5 cmH20] 5 cmH20 CXR:  10/8 >>>pulmonary edema 10/9 >>>pulm edema, slight improvement from 10/8 10/10: slight improvement of interstitial pulmonary edema ETT:  10/8>>>10/10 Reintubation: 10/11>>>  A:   Acute Hypercarbic Respiratory Failure, now recurrent failure 10/11 despite diuresis and adequate CVP. ? Due to OSA/OHS + anxiety, ? Contribution of bronchitis/HCAP + COPD COPD - on spiriva, symbicort, BD's at home.  CHF exacerbation likely contributed to initial respiratory failure, but appears to have been addressed P:   - continue current settings - sedated w propofol  CARDIOVASCULAR  Lab 10/21/11 0455 10/20/11 1606 10/20/11 0503 10/19/11 2100 10/19/11 1454 10/19/11 1453  TROPONINI -- <0.30 <0.30 <0.30 -- <0.30  LATICACIDVEN -- -- -- -- 1.5 --  PROBNP 13824.0* -- -- -- -- 9736.0*   ECG:  SR on monitor.  EKG with PVC's, no acute ST changes 10/9: PVC's, sinus tach, no ST changes Lines:  10/8 L IJ TLC>>>  Echo: 10/09: Study Conclusions  - Left ventricle: The cavity size was moderately dilated. Wall thickness was normal. Systolic function was mildly to moderately reduced. The estimated ejection fraction was in the range of 40% to 45%. Due to first degree atrioventricular block, there was fusion of early and atrial contributions to ventricular filling. - Left atrium: The atrium was mildly dilated. - Pulmonary  arteries: PA peak pressure: 31mm Hg (S).  A:  A-fib- on amiodarone drip and heparin drip Chest Pain - acute chest pain prior to arrival. Chest pain overnight. Negative trop.  Noted pulmonary edema on cxr, but volume status appears to be improved  Hx of HTN - on norvasc, lasix at home; lasix currently held Systolic CHF: EF:40-45%, not candidate for cath or CABG.   P:  PA-c placed 10/11 to guide  volume status, assess CO Creatinine stable from yesterday with lasix 40IV bid. Will hold 10/11 given PAOP 13 Continue amiodarone. Heparin start 10/10, probably xarelto once stabilized Continue ASA  RENAL  Lab 10/22/11 0300 10/21/11 2000 10/21/11 0455 10/20/11 0503 10/19/11 1505 10/19/11 1453  NA 143 143 143 141 140 --  K 3.4* 3.6 -- -- -- --  CL 98 98 98 100 100 --  CO2 33* 36* 35* 33* -- 32  BUN 45* 45* 40* 31* 31* --  CREATININE 1.73* 1.75* 1.75* 1.48* 1.50* --  CALCIUM 8.9 8.9 8.8 8.8 -- 9.3  MG -- -- -- 1.6 -- --  PHOS -- -- -- 4.2 -- --   Intake/Output      10/10 0701 - 10/11 0700   I.V. (mL/kg) 1075.4 (8.8)   NG/GT 30   IV Piggyback 275   Total Intake(mL/kg) 1380.4 (11.3)   Urine (mL/kg/hr) 2430 (0.8)   Stool 1   Total Output 2431   Net -1050.6        Foley:  10/8 >>>  A:   Acute Renal Insufficiency on chronic renal insufficiency: baseline Cr: 1.5 -on chronic lasix at home  P:   - dose lasix daily based on POAP - BMP in am  GASTROINTESTINAL  Lab 10/22/11 0300 10/21/11 0455 10/20/11 0503 10/19/11 1453  AST 26 30 50* 77*  ALT 40* 45* 60* 70*  ALKPHOS 64 54 50 66  BILITOT 0.3 0.4 0.3 0.5  PROT 6.9 6.6 6.3 7.5  ALBUMIN 2.7* 2.7* 2.6* 3.3*    A:   Morbid Obesity Mild Elevation of LFT's -??if medication related- trending down  P:   -elevated ALT stable and trending down, follow LFT  HEMATOLOGIC  Lab 10/22/11 0300 10/21/11 0455 10/20/11 1607 10/20/11 0503 10/19/11 1505 10/19/11 1453  HGB 9.4* 9.4* 9.7* 9.1* 12.2 --  HCT 28.6* 30.1* 30.7* 24.7* 36.0 --  PLT 240 213 222 219 -- 363  INR -- -- -- -- -- --  APTT -- -- -- -- -- --   A:   No acute issues.  Hx of Anemia (Hg:10.5) stable   P:  -monitor cbc in the setting of heparin -anemia   INFECTIOUS  Lab 10/22/11 0300 10/21/11 0455 10/20/11 1607 10/20/11 0503 10/19/11 1453  WBC 9.3 10.8* 12.2* 10.3 18.0*  PROCALCITON -- 4.28 -- 4.24 <0.10   Cultures: 10/8 BCx2>>>no growth to date 10/8  Sputum>>> not collected 10/8 UA>>> 10/8 PCT>>>4.24 10/8 urine strep>>> not collected 10/8 urine legionella >>> not collected Antibiotics: Zosyn (?HCAP) 10/8>>> Vanco (?HCAP) 10/8>>> 10/10 Diflucan (yeast under pannus) 10/8>>>10/10  A:   Bilateral airspace disease -likely edema but difficult to interperet due to body habitus On vanc and zosyn for HCAP P:   -cultures not collected, currently on zosyn, afebrile with normal WBC.   ENDOCRINE  Lab 10/22/11 0352 10/21/11 2343 10/21/11 2014 10/21/11 1608 10/21/11 1157  GLUCAP 132* 120* 208* 115* 151*   A:   DM - on 70/30 at home 30 units BID    A1C: 7.3 P:   -  ICU SSI  NEUROLOGIC 10/8 head CT>>> no acute process A:   AMS - likely related to hypercarbia. Now back to baseline Hx anxiety / depression - on xanax, celexa at home P:   Continue to monitor   BEST PRACTICE / DISPOSITION Level of Care:  ICU Primary Service:  PCCM Consultants:  cardiology Code Status:  FULL Diet:  NPO DVT Px:  heparin gtt GI Px:  Protonix Skin Integrity:  Rash as above Social / Family:  None available.    10/22/2011, 6:52 AM [name], M.D. [phone]  CC time 60 minutes  Levy Pupa, MD, PhD 10/22/2011, 6:52 AM [facility] [phone] or if no answer [phone]

## 2011-10-22 NOTE — Procedures (Signed)
Intubation Procedure Note [name] [id number] 1945-11-22  Procedure: Intubation Indications: Respiratory insufficiency  Procedure Details Consent: Risks of procedure as well as the alternatives and risks of each were explained to the (patient/caregiver).  Consent for procedure obtained. Time Out: Verified patient identification, verified procedure, site/side was marked, verified correct patient position, special equipment/implants available, medications/allergies/relevent history reviewed, required imaging and test results available.  Performed  Laryngoscope: glidoscope   Evaluation Hemodynamic Status: BP stable throughout; O2 sats: stable throughout Patient's Current Condition: stable Complications: No apparent complications Patient did tolerate procedure well. Chest X-ray ordered to verify placement.  CXR: pending.   [name] 10/22/2011    Levy Pupa, MD, PhD 10/22/2011, 2:00 PM [facility] [phone] or if no answer [phone]

## 2011-10-22 NOTE — Procedures (Signed)
Pulmonary Artery Catheter Insertion Procedure Note [name] [id number] 1945/05/01  Procedure: Insertion of Central Venous Catheter Indications: Assessment of intravascular volume  Procedure Details Consent: Risks of procedure as well as the alternatives and risks of each were explained to the (patient/caregiver).  Consent for procedure obtained. Time Out: Verified patient identification, verified procedure, site/side was marked, verified correct patient position, special equipment/implants available, medications/allergies/relevent history reviewed, required imaging and test results available.  Performed  Maximum sterile technique was used including antiseptics, cap, gloves, gown, hand hygiene, mask and sheet. Skin prep: Chlorhexidine; local anesthetic administered A antimicrobial bonded/coated single lumen catheter was placed in the right internal jugular vein using the Seldinger technique. Ultrasound guidance used.yes Cordis placed without problem. PA catheter placed to 20 cms. Balloon inflated. Passed via Rt atrium(20 mm/hg pressure) thru  (RV 41/21 mm/hg)to PA with (37/21 mm/hg ) wedged at 55 cms with PAO 13. Taped in place and chest xray ordered.  Evaluation Blood flow good Complications: No apparent complications Patient did tolerate procedure well. Chest X-ray ordered to verify placement.  CXR: pending.  Brett Canales Minor ACNP [facility] P[facility] Pager [phone] till 3 pm If no answer page [phone] 10/22/2011, 11:05 AM   Levy Pupa, MD, PhD 10/22/2011, 2:00 PM [facility] [phone] or if no answer [phone]

## 2011-10-22 NOTE — Progress Notes (Signed)
1828 6 beat run of VT noted.  No change in patient.  Dr. Vassie Loll notified will continue to monitor.

## 2011-10-22 NOTE — Progress Notes (Signed)
Noted patient has been re-intubated- I have faxed her information out to area SNF's [facility]).  Will continue to follow for further d/c planning as appropriate and for support as needed- Reece Levy, MSW, [employer] [phone]

## 2011-10-22 NOTE — Clinical Social Work Placement (Signed)
     Clinical Social Work Department CLINICAL SOCIAL WORK PLACEMENT NOTE 10/22/2011  Patient:  [name], [name]  Account Number:  [id number] Admit date:  10/19/2011  Clinical Social Worker:  Robin Searing  Date/time:  10/22/2011 02:56 PM  Clinical Social Work is seeking post-discharge placement for this patient at the following level of care:   SKILLED NURSING   (*CSW will update this form in Epic as items are completed)   10/22/2011  Patient/family provided with [facility] Department of Clinical Social Works list of facilities offering this level of care within the geographic area requested by the patient (or if unable, by the patients family).  10/22/2011  Patient/family informed of their freedom to choose among providers that offer the needed level of care, that participate in Medicare, Medicaid or managed care program needed by the patient, have an available bed and are willing to accept the patient.  10/22/2011  Patient/family informed of [facility] ownership interest in [facility], as well as of the fact that they are under no obligation to receive care at this facility.  PASARR submitted to EDS on 10/22/2011 PASARR number received from EDS on 10/22/2011  FL2 transmitted to all facilities in geographic area requested by pt/family on  10/22/2011 FL2 transmitted to all facilities within larger geographic area on   Patient informed that his/her managed care company has contracts with or will negotiate with  certain facilities, including the following:     Patient/family informed of bed offers received:   Patient chooses bed at  Physician recommends and patient chooses bed at    Patient to be transferred to  on   Patient to be transferred to facility by   The following physician request were entered in Epic:   Additional Comments:

## 2011-10-23 ENCOUNTER — Inpatient Hospital Stay (HOSPITAL_COMMUNITY): Payer: Medicare Other

## 2011-10-23 DIAGNOSIS — E876 Hypokalemia: Secondary | ICD-10-CM

## 2011-10-23 DIAGNOSIS — I5033 Acute on chronic diastolic (congestive) heart failure: Secondary | ICD-10-CM

## 2011-10-23 LAB — CBC
Hemoglobin: 9 g/dL — ABNORMAL LOW (ref 12.0–15.0)
MCH: 30.1 pg (ref 26.0–34.0)
MCV: 94.3 fL (ref 78.0–100.0)
RBC: 2.99 MIL/uL — ABNORMAL LOW (ref 3.87–5.11)

## 2011-10-23 LAB — BASIC METABOLIC PANEL
CO2: 33 mEq/L — ABNORMAL HIGH (ref 19–32)
Calcium: 8.7 mg/dL (ref 8.4–10.5)
Glucose, Bld: 124 mg/dL — ABNORMAL HIGH (ref 70–99)
Sodium: 143 mEq/L (ref 135–145)

## 2011-10-23 LAB — HEPARIN LEVEL (UNFRACTIONATED)
Heparin Unfractionated: 0.48 IU/mL (ref 0.30–0.70)
Heparin Unfractionated: 0.36 IU/mL (ref 0.30–0.70)

## 2011-10-23 LAB — CARBOXYHEMOGLOBIN: Carboxyhemoglobin: 1.1 % (ref 0.5–1.5)

## 2011-10-23 LAB — GLUCOSE, CAPILLARY
Glucose-Capillary: 101 mg/dL — ABNORMAL HIGH (ref 70–99)
Glucose-Capillary: 108 mg/dL — ABNORMAL HIGH (ref 70–99)
Glucose-Capillary: 118 mg/dL — ABNORMAL HIGH (ref 70–99)
Glucose-Capillary: 160 mg/dL — ABNORMAL HIGH (ref 70–99)

## 2011-10-23 MED ORDER — FUROSEMIDE 10 MG/ML IJ SOLN
80.0000 mg | Freq: Two times a day (BID) | INTRAMUSCULAR | Status: DC
  Administered 2011-10-23 – 2011-10-24 (×2): 80 mg via INTRAVENOUS
  Filled 2011-10-23 (×2): qty 8

## 2011-10-23 MED ORDER — IPRATROPIUM-ALBUTEROL 18-103 MCG/ACT IN AERO
6.0000 | INHALATION_SPRAY | Freq: Four times a day (QID) | RESPIRATORY_TRACT | Status: DC
  Administered 2011-10-23 – 2011-10-24 (×5): 6 via RESPIRATORY_TRACT
  Filled 2011-10-23: qty 14.7

## 2011-10-23 MED ORDER — POTASSIUM CHLORIDE 10 MEQ/50ML IV SOLN
10.0000 meq | INTRAVENOUS | Status: AC
  Administered 2011-10-23 (×2): 10 meq via INTRAVENOUS
  Filled 2011-10-23: qty 100

## 2011-10-23 MED ORDER — PNEUMOCOCCAL VAC POLYVALENT 25 MCG/0.5ML IJ INJ
0.5000 mL | INJECTION | INTRAMUSCULAR | Status: AC
  Filled 2011-10-23: qty 0.5

## 2011-10-23 MED ORDER — METHYLPREDNISOLONE SODIUM SUCC 125 MG IJ SOLR
60.0000 mg | Freq: Four times a day (QID) | INTRAMUSCULAR | Status: DC
  Administered 2011-10-23 – 2011-10-26 (×12): 60 mg via INTRAVENOUS
  Filled 2011-10-23 (×16): qty 0.96

## 2011-10-23 MED ORDER — POTASSIUM CHLORIDE 10 MEQ/50ML IV SOLN
10.0000 meq | INTRAVENOUS | Status: AC
  Administered 2011-10-23 (×4): 10 meq via INTRAVENOUS
  Filled 2011-10-23: qty 200

## 2011-10-23 MED ORDER — INFLUENZA VIRUS VACC SPLIT PF IM SUSP
0.5000 mL | INTRAMUSCULAR | Status: AC
  Filled 2011-10-23: qty 0.5

## 2011-10-23 NOTE — Progress Notes (Signed)
ANTICOAGULATION CONSULT NOTE - Follow Up Consult  Pharmacy Consult for heparin Indication: atrial fibrillation  Labs:  Basename 10/23/11 1040 10/23/11 0355 10/22/11 1915 10/22/11 1841 10/22/11 0300 10/21/11 0455 10/20/11 1606  HGB -- 9.0* -- -- 9.4* -- --  HCT -- 28.2* -- -- 28.6* 30.1* --  PLT -- 200 -- -- 240 213 --  APTT -- -- -- -- -- -- --  LABPROT -- -- -- -- -- -- --  INR -- -- -- -- -- -- --  HEPARINUNFRC 0.36 0.48 0.22* -- -- -- --  CREATININE -- 1.76* -- 1.88* 1.73* -- --  CKTOTAL -- -- -- -- -- -- --  CKMB -- -- -- -- -- -- --  TROPONINI -- -- -- -- -- -- <0.30    Assessment 66yo female with afib now therapeutic on heparin and at goal.  Noted plans for likely apixaban or rivoroxaban when patient more stable.  Goal of Therapy:  Heparin level 0.3-0.7 units/ml Platelet monitoring per protocol: yes  Plan -Continue heparin at 1950 units/hr -Heparin level and CBC daily  Harland German, Pharm D 10/23/2011 12:50 PM

## 2011-10-23 NOTE — Progress Notes (Signed)
Name: [name] MRN: [id number] DOB: [birth year]    LOS: 4  Referring Provider:  EDP -Patria Mane Reason for Referral:  Acute Respiratory Failure    PULMONARY / CRITICAL CARE MEDICINE  Events Since Admission: 10/8 - Admit with chest pain, respiratory failure, AMS. Intubated 10/8 10/09: episode of a-fib with chest discomfort. Started on amiodarone on 10/10 10/10: extubated  10/11: reintubated  Subjective:  Awake, answering questions Comfortable on MV  Vital Signs: Temp:  [97.7 F (36.5 C)-99 F (37.2 C)] 97.7 F (36.5 C) (10/12 0757) Pulse Rate:  [60-120] 77  (10/12 0757) Resp:  [16-19] 18  (10/12 0757) BP: (79-201)/(24-166) 128/49 mmHg (10/12 0545) SpO2:  [94 %-100 %] 100 % (10/12 0757) FiO2 (%):  [35 %-99.9 %] 35 % (10/12 0800) Weight:  [120 kg (264 lb 8.8 oz)] 120 kg (264 lb 8.8 oz) (10/12 0500)  Intake/Output Summary (Last 24 hours) at 10/23/11 0859 Last data filed at 10/23/11 0700  Gross per 24 hour  Intake 1923.93 ml  Output   1085 ml  Net 838.93 ml   Physical Examination: General:  Morbidly obese in NAD on vent Neuro:  Sedated, moves ext's spontaneously, follows commands HEENT:  Mm pink/moist, OETT Cardiovascular:  S1s2, afib Lungs:  Coarse breath sounds Abdomen:  Obese/soft, erythematous (yeast-like) appearance & odor rash under pannus  Musculoskeletal:  No acute deformities Skin:  Intact, rash under pannus   Active Problems:  Pulmonary edema  Hypoxemia  COPD (chronic obstructive pulmonary disease)  Atrial fibrillation   ASSESSMENT AND PLAN  PULMONARY  Lab 10/23/11 0549 10/22/11 0932 10/22/11 0513 10/21/11 0500 10/20/11 1636 10/20/11 0500 10/19/11 1736 10/19/11 1454  PHART -- 7.313* -- -- -- 7.377 7.281* 7.121*  PCO2ART -- 63.4* -- -- -- 56.4* 71.9* 120.1*  PO2ART -- 82.9 -- -- -- 85.5 86.0 163.0*  HCO3 -- 31.2* -- -- -- 32.1* 34.0* 38.8*  O2SAT 59.2 94.3 53.0 68.7 68.4 -- -- --   Ventilator Settings: Vent Mode:  [-] PRVC FiO2 (%):  [35  %-99.9 %] 35 % Set Rate:  [18 bmp] 18 bmp Vt Set:  [500 mL] 500 mL PEEP:  [0 cmH20-5 cmH20] 0 cmH20 Plateau Pressure:  [26 cmH20-28 cmH20] 27 cmH20 CXR:  10/8 >>>pulmonary edema 10/9 >>>pulm edema, slight improvement from 10/8 10/10: slight improvement of interstitial pulmonary edema ETT:  10/8>>>10/10 Reintubation: 10/11>>>  A:   Acute Hypercarbic Respiratory Failure, now recurrent failure 10/11 despite diuresis and adequate CVP. ? Due to OSA/OHS + anxiety, ? Contribution of bronchitis/HCAP + COPD (curently wheezing 10/12) COPD - on spiriva, symbicort, BD's at home. Currently wheezing > rx with steroids 10/12 CHF exacerbation likely contributed to initial respiratory failure, but appears to have been addressed, PAOP 13 on 10/11 P:   - continue current settings - sedated w propofol - start solumedrol 10/12  CARDIOVASCULAR  Lab 10/21/11 0455 10/20/11 1606 10/20/11 0503 10/19/11 2100 10/19/11 1454 10/19/11 1453  TROPONINI -- <0.30 <0.30 <0.30 -- <0.30  LATICACIDVEN -- -- -- -- 1.5 --  PROBNP 13824.0* -- -- -- -- 9736.0*   ECG:  SR on monitor.  EKG with PVC's, no acute ST changes 10/9: PVC's, sinus tach, no ST changes Lines:  10/8 L IJ TLC>>>  Echo: 10/09: Study Conclusions  - Left ventricle: The cavity size was moderately dilated. Wall thickness was normal. Systolic function was mildly to moderately reduced. The estimated ejection fraction was in the range of 40% to 45%. Due to first degree atrioventricular block, there was fusion of  early and atrial contributions to ventricular filling. - Left atrium: The atrium was mildly dilated. - Pulmonary arteries: PA peak pressure: 31mm Hg (S).  A:  A-fib- on amiodarone drip and heparin drip Chest Pain - acute chest pain prior to arrival. Chest pain overnight. Negative trop.  Noted pulmonary edema on cxr, but volume status appears to be improved  Hx of HTN - on norvasc, lasix at home; lasix currently held Systolic CHF: EF:40-45%,  not candidate for cath or CABG.   P:  PA-c placed 10/11 to guide volume status, assess CO Creatinine stable. Lasix eld 10/11 given PAOP 13 Continue amiodarone. Heparin start 10/10, probably xarelto once stabilized Continue ASA  RENAL  Lab 10/23/11 0355 10/22/11 1841 10/22/11 0300 10/21/11 2000 10/21/11 0455 10/20/11 0503  NA 143 144 143 143 143 --  K 3.3* 3.5 -- -- -- --  CL 98 99 98 98 98 --  CO2 33* 35* 33* 36* 35* --  BUN 49* 48* 45* 45* 40* --  CREATININE 1.76* 1.88* 1.73* 1.75* 1.75* --  CALCIUM 8.7 8.7 8.9 8.9 8.8 --  MG -- 1.8 -- -- -- 1.6  PHOS -- -- -- -- -- 4.2   Intake/Output      10/11 0701 - 10/12 0700 10/12 0701 - 10/13 0700   I.V. (mL/kg) 1810.6 (15.1)    NG/GT     IV Piggyback 150    Total Intake(mL/kg) 1960.6 (16.3)    Urine (mL/kg/hr) 1235 (0.4)    Stool     Total Output 1235    Net +725.6          Foley:  10/8 >>>  A:   Acute Renal Insufficiency on chronic renal insufficiency: baseline Cr: 1.5 -on chronic lasix at home  P:   - dose lasix daily based on POAP - BMP in am  GASTROINTESTINAL  Lab 10/22/11 0300 10/21/11 0455 10/20/11 0503 10/19/11 1453  AST 26 30 50* 77*  ALT 40* 45* 60* 70*  ALKPHOS 64 54 50 66  BILITOT 0.3 0.4 0.3 0.5  PROT 6.9 6.6 6.3 7.5  ALBUMIN 2.7* 2.7* 2.6* 3.3*    A:   Morbid Obesity Mild Elevation of LFT's -??if medication related- trending down  P:   -elevated ALT stable and trending down, follow LFT  HEMATOLOGIC  Lab 10/23/11 0355 10/22/11 0300 10/21/11 0455 10/20/11 1607 10/20/11 0503  HGB 9.0* 9.4* 9.4* 9.7* 9.1*  HCT 28.2* 28.6* 30.1* 30.7* 24.7*  PLT 200 240 213 222 219  INR -- -- -- -- --  APTT -- -- -- -- --   A:   No acute issues.  Hx of Anemia (Hg:10.5) stable   P:  -monitor cbc in the setting of heparin -anemia   INFECTIOUS  Lab 10/23/11 0355 10/22/11 0300 10/21/11 0455 10/20/11 1607 10/20/11 0503 10/19/11 1453  WBC 5.9 9.3 10.8* 12.2* 10.3 --  PROCALCITON -- -- 4.28 -- 4.24 <0.10    Cultures: 10/8 BCx2>>>no growth to date 10/8 Sputum>>> not collected 10/8 UA>>> 10/8 PCT>>>4.24 10/8 urine strep>>> not collected 10/8 urine legionella >>> not collected Antibiotics: Zosyn (?HCAP) 10/8>>> Vanco (?HCAP) 10/8>>> 10/10 Diflucan (yeast under pannus) 10/8>>>10/10  A:   Bilateral airspace disease -likely edema but difficult to interperet due to body habitus On vanc and zosyn for HCAP P:   -cultures not collected, currently on zosyn, afebrile with normal WBC.   ENDOCRINE  Lab 10/23/11 0735 10/23/11 0401 10/23/11 0013 10/22/11 2018 10/22/11 1552  GLUCAP 108* 118* 101* 99 105*  A:   DM - on 70/30 at home 30 units BID    A1C: 7.3 P:   -ICU SSI  NEUROLOGIC 10/8 head CT>>> no acute process A:   AMS - likely related to hypercarbia. Now back to baseline Hx anxiety / depression - on xanax, celexa at home P:   Continue to monitor   BEST PRACTICE / DISPOSITION Level of Care:  ICU Primary Service:  PCCM Consultants:  cardiology Code Status:  FULL Diet:  NPO DVT Px:  heparin gtt GI Px:  Protonix Skin Integrity:  Rash as above Social / Family:  Discussed goals of care with sisters on 10/11. Explained that she has chronic resp failure, that we may need to work towards extubation without plan for reintubation if she were to fail. Once extubated, discuss a permanent DNR order.   CC time 30 minutes  Levy Pupa, MD, PhD 10/23/2011, 8:59 AM [facility] [phone] or if no answer [phone]

## 2011-10-23 NOTE — Progress Notes (Signed)
[facility] ADULT ICU REPLACEMENT PROTOCOL FOR AM LAB REPLACEMENT ONLY  The patient does not apply for the [facility] Adult ICU Electrolyte Replacment Protocol based on the criteria listed below:   1. Is GFR >/= 50 ml/min? no  Patient's GFR today is 34 2. Is urine output >/= 0.5 ml/kg/hr for the last 8 hours? no Patient's UOP is .31 ml/kg/hr 3. Is BUN < 30 mg/dL? no  Patient's BUN today is 49 4. Abnormal electrolyte(s):K-3.3     Physician:  Dr Scheryl Marten, Clarnce Flock 10/23/2011 5:00 AM

## 2011-10-23 NOTE — Progress Notes (Signed)
ANTICOAGULATION CONSULT NOTE - Follow Up Consult  Pharmacy Consult for heparin Indication: atrial fibrillation  Labs:  Basename 10/23/11 0355 10/22/11 1915 10/22/11 1841 10/22/11 0300 10/22/11 0150 10/21/11 2000 10/21/11 0455 10/20/11 1606 10/20/11 0503  HGB 9.0* -- -- 9.4* -- -- -- -- --  HCT 28.2* -- -- 28.6* -- -- 30.1* -- --  PLT 200 -- -- 240 -- -- 213 -- --  APTT -- -- -- -- -- -- -- -- --  LABPROT -- -- -- -- -- -- -- -- --  INR -- -- -- -- -- -- -- -- --  HEPARINUNFRC 0.48 0.22* -- -- 0.32 -- -- -- --  CREATININE -- -- 1.88* 1.73* -- 1.75* -- -- --  CKTOTAL -- -- -- -- -- -- -- -- --  CKMB -- -- -- -- -- -- -- -- --  TROPONINI -- -- -- -- -- -- -- <0.30 <0.30    Assessment/Plan: 66yo female now therapeutic on heparin after rate increase.  Will continue heparin gtt at current rate and confirm stable with additional level.  Colleen Can PharmD BCPS 10/23/2011,4:28 AM

## 2011-10-23 NOTE — Progress Notes (Signed)
eLink Physician-Brief Progress Note Patient Name: [name] DOB: 1945-03-26 MRN: [id number]  Date of Service  10/23/2011   HPI/Events of Note   Hypokalemia  eICU Interventions  K replaced        Brentton Wardlow 10/23/2011, 5:35 AM

## 2011-10-23 NOTE — Progress Notes (Addendum)
Advanced Heart Failure Rounding Note   Subjective:    [name] is a 66 y/o woman with multiple medical problems including morbid obesity, COPD on 4L home O2, systolic CHF with EF 30-35% DM2, HTN, CRI (baseline ~1.50 and HL. She has had multiple admissions for respiratory failure.  Was admitted in 3/13 with dyspnea thought to be due to COPD and CHF. Echo 03/24/11: LVEF 30-35% with mild concentric hypertrophy. Mild MR. LA mildly dilated. Myoview 03/25/11: No evidence for inducible ischemia. Moderate inferolateral and small distal anteroseptal fixed defects are identified and may be related to scar. Inferior and mid/distal inferolateral hypokinesia extending into the apex. Left ventricular ejection fraction of 35%. Cath deferred as she was not candidate for CABG (due to COPD) and not having angina to warrant PCI if lesion was found. Opted for medical therapy.   Came to ER 10/8 for two days of CP and progressive dyspnea. Found to be in respiratory failure. ABG 7.1/120/163/98% on facemask. Intubated in ER. Seen by p/ccm. ROS not available as patient intubated. CXR with mild pulmonary edema. pBNP 9736 (was 7300 in 6/13). WBC 18k. POC troponin < 0.30 (Baseline weight 273)  Re-intubated yesterday for recurrent resp failure. PCWP initially 13. Back in NSR on amio. Lasix held yesterday.  Awake on vent. No CP. Swan numbers done personally at bedside  CVP 17 PA 69/32 (35) PCWP 28    Objective:   Weight Range:  Vital Signs:   Temp:  [97.7 F (36.5 C)-98.7 F (37.1 C)] 97.7 F (36.5 C) (10/12 0757) Pulse Rate:  [60-79] 77  (10/12 0757) Resp:  [16-19] 18  (10/12 0757) BP: (79-134)/(24-70) 128/49 mmHg (10/12 0545) SpO2:  [96 %-100 %] 100 % (10/12 0757) FiO2 (%):  [35 %-99.9 %] 35 % (10/12 0800) Weight:  [120 kg (264 lb 8.8 oz)] 120 kg (264 lb 8.8 oz) (10/12 0500)    Weight change: Filed Weights   10/21/11 0500 10/22/11 0500 10/23/11 0500  Weight: 124 kg (273 lb 5.9 oz) 121.9 kg (268 lb 11.9 oz)  120 kg (264 lb 8.8 oz)    Intake/Output:   Intake/Output Summary (Last 24 hours) at 10/23/11 1004 Last data filed at 10/23/11 0700  Gross per 24 hour  Intake 1835.93 ml  Output    910 ml  Net 925.93 ml     Physical Exam: CVP 12-13 General: Morbidly obese. Tachypneic. Awake on vent Neuro: Awake, alert. Follows commands HEENT: Normal except for ETT  Cardiovascular: PMI nonpalpable. Distant HS s1s2 irr tachy, no m/r/g  Lungs:Mild wheeze Abdomen: Obese/soft,  Nontender. Good BS  Musculoskeletal: No acute deformities  Skin: Intact, rash under pannus (see above), long unkempt toenails  Extremities: tr-1+ edema RLE/LLE , RUE art line in place . PAS in place     Telemetry: Sinus rhythm  Labs: Basic Metabolic Panel:  Lab 10/23/11 1610 10/22/11 1841 10/22/11 0300 10/21/11 2000 10/21/11 0455 10/20/11 0503  NA 143 144 143 143 143 --  K 3.3* 3.5 3.4* 3.6 3.8 --  CL 98 99 98 98 98 --  CO2 33* 35* 33* 36* 35* --  GLUCOSE 124* 115* 128* 221* 124* --  BUN 49* 48* 45* 45* 40* --  CREATININE 1.76* 1.88* 1.73* 1.75* 1.75* --  CALCIUM 8.7 8.7 8.9 -- -- --  MG -- 1.8 -- -- -- 1.6  PHOS -- -- -- -- -- 4.2    Liver Function Tests:  Lab 10/22/11 0300 10/21/11 0455 10/20/11 0503 10/19/11 1453  AST 26 30 50*  77*  ALT 40* 45* 60* 70*  ALKPHOS 64 54 50 66  BILITOT 0.3 0.4 0.3 0.5  PROT 6.9 6.6 6.3 7.5  ALBUMIN 2.7* 2.7* 2.6* 3.3*   No results found for this basename: LIPASE:5,AMYLASE:5 in the last 168 hours No results found for this basename: AMMONIA:3 in the last 168 hours  CBC:  Lab 10/23/11 0355 10/22/11 0300 10/21/11 0455 10/20/11 1607 10/20/11 0503 10/19/11 1453  WBC 5.9 9.3 10.8* 12.2* 10.3 --  NEUTROABS -- -- 8.9* -- -- 13.4*  HGB 9.0* 9.4* 9.4* 9.7* 9.1* --  HCT 28.2* 28.6* 30.1* 30.7* 24.7* --  MCV 94.3 95.7 94.4 92.2 96.5 --  PLT 200 240 213 222 219 --    Cardiac Enzymes:  Lab 10/20/11 1606 10/20/11 0503 10/19/11 2100 10/19/11 1453  CKTOTAL -- -- -- --  CKMB -- --  -- --  CKMBINDEX -- -- -- --  TROPONINI <0.30 <0.30 <0.30 <0.30    BNP: BNP (last 3 results)  Basename 10/21/11 0455 10/19/11 1453 06/26/11 0526  PROBNP 13824.0* 9736.0* 7258.0*     Other results:     Imaging: Dg Chest Port 1 View  10/23/2011  *RADIOLOGY REPORT*  Clinical Data: Endotracheal tube, concern for pulmonary edema  PORTABLE CHEST - 1 VIEW  Comparison: 10/22/2011  Findings: Endotracheal tube terminates 6 cm above the carina.  Right IJ Swan-Ganz catheter with its tip in the right lower lobe pulmonary artery.  Withdrawal approximately 3 cm is suggested.  Stable left IJ venous catheter with its tip in the SVC.  Prior vascular congestion with suspected mild interstitial edema. Right lung base is excluded.  Stable cardiomegaly.  IMPRESSION: Endotracheal tube terminates 6 cm above the carina.  Right IJ Swan-Ganz catheter with its tip in the right lower lobe pulmonary artery.  Withdrawal approximately 3 cm is suggested.  Cardiomegaly with suspected mild interstitial edema, unchanged.   Original Report Authenticated By: Charline Bills, M.D.    Dg Chest Port 1 View  10/22/2011  *RADIOLOGY REPORT*  Clinical Data: Central line placement intubation  PORTABLE CHEST - 1 VIEW  Comparison: Portable exam 1129 hours compared to 10/22/2011 at the 0714 hours  Findings: Tip of endotracheal tube 5.8 cm above carina. New right jugular Swan-Ganz catheter, tip at descending interlobar pulmonary artery. Left jugular central venous catheter, tip projecting over SVC. Enlargement of cardiac silhouette with slight pulmonary vascular congestion. Minimal perihilar edema. No pleural effusion or pneumothorax. Bones demineralized.  IMPRESSION: No pneumothorax following central line placement. Enlargement of cardiac silhouette with pulmonary vascular congestion and mild perihilar edema, slightly decreased since previous exam.   Original Report Authenticated By: Lollie Marrow, M.D.    Dg Chest Port 1  View  10/22/2011  *RADIOLOGY REPORT*  Clinical Data: Difficulty breathing.  PORTABLE CHEST - 1 VIEW  Comparison: 10/21/2011.  Findings: Endotracheal tube and nasogastric tube have been removed. Left central line unchanged in position.  Cardiomegaly.  Pulmonary vascular congestion/pulmonary edema most notable centrally.  Additionally, consolidation lung bases. Although this may represent changes pulmonary edema, infectious infiltrate not excluded in the proper clinical setting.  Pleural effusion cannot be excluded particularly the left.  No gross pneumothorax.  Prominence hilar structures stable.  Calcified aorta.  IMPRESSION:  Cardiomegaly.  Pulmonary vascular congestion/pulmonary edema most notable centrally.  Additionally, consolidation lung bases.  Although this may represent changes  of pulmonary edema, infectious infiltrate not excluded in the proper clinical setting.  Pleural effusion cannot be excluded particularly on the left.   Original Report  Authenticated By: Fuller Canada, M.D.      Medications:     Scheduled Medications:    . albuterol-ipratropium  6 puff Inhalation Q6H  . antiseptic oral rinse  15 mL Mouth Rinse QID  . chlorhexidine  15 mL Mouth Rinse BID  . Chlorhexidine Gluconate Cloth  6 each Topical Q0600  . ferrous sulfate  325 mg Oral Q breakfast  . influenza  inactive virus vaccine  0.5 mL Intramuscular Tomorrow-1000  . insulin aspart  2-6 Units Subcutaneous Q4H  . methylPREDNISolone (SOLU-MEDROL) injection  60 mg Intravenous Q6H  . mupirocin ointment   Nasal BID  . pantoprazole (PROTONIX) IV  40 mg Intravenous QHS  . piperacillin-tazobactam (ZOSYN)  IV  3.375 g Intravenous Q8H  . pneumococcal 23 valent vaccine  0.5 mL Intramuscular Tomorrow-1000  . potassium chloride  10 mEq Intravenous Q1 Hr x 2  . rocuronium  0.6 mg/kg Intravenous Once  . DISCONTD: budesonide-formoterol  2 puff Inhalation BID  . DISCONTD: tiotropium  18 mcg Inhalation Daily    Infusions:     . amiodarone (NEXTERONE PREMIX) 360 mg/200 mL dextrose 0.5 mg/min (10/23/11 0415)  . heparin 1,950 Units/hr (10/22/11 2200)  . propofol 10 mcg/kg/min (10/23/11 0530)  . DISCONTD: heparin 1,750 Units/hr (10/22/11 1425)    PRN Medications: sodium chloride, albuterol, ALPRAZolam, fentaNYL   Assessment:  1. A/C respiratory failure  2. A/c systolic HF EF 30-35%  3. O2 dependent COPD  4. Chronic renal failure  5. DM2  6. Morbid obesity  7. Chest pain with negative troponins 8. Anemia 9. A/C renal failure 10. AF with RVR   Plan/Discussion:     She now has recurrent respiratory failure. This is clearly multifactorial due to severe COPD, obesity hypoventilation syndrome, anxiety and diastolic/systolic HF. (EF 40-45%)  Volume status back up - goal wedge 15-20 - can use this to help guide extubation. Will restart lasix. I agree with Dr. Delton Coombes that her respiratory status is clearly reaching end-stage and very reasonable to consider a no re-intubation scenario  We also discussed possibility of ischemic heart disease but she has tolerated significant hemodynamic stress without bumping troponin at all and thus I think possibility of high-grade CAD less likely and will defer cath for now as her renal function is tenuous and she is not CABG cnadidate. May need to reconsider at some point.  She is unlikely to tolerate AF well  in the long-term. Will continue with brief course of amiodarone (being careful with known lung disease). Will also need anti-coagulation. Continue heparin. Favor NOAC (apixaban or rivoroxaban) over coumadin at d/c.   Will probably need NG tube to supplement KCL. I will give 4 runs Kcl until this is in place.  Length of Stay: 4 [name] Greer,MD 10:04 AM

## 2011-10-24 ENCOUNTER — Inpatient Hospital Stay (HOSPITAL_COMMUNITY): Payer: Medicare Other

## 2011-10-24 LAB — BASIC METABOLIC PANEL
BUN: 48 mg/dL — ABNORMAL HIGH (ref 6–23)
BUN: 50 mg/dL — ABNORMAL HIGH (ref 6–23)
CO2: 31 mEq/L (ref 19–32)
Calcium: 8.9 mg/dL (ref 8.4–10.5)
Calcium: 9.2 mg/dL (ref 8.4–10.5)
Chloride: 98 mEq/L (ref 96–112)
Creatinine, Ser: 1.62 mg/dL — ABNORMAL HIGH (ref 0.50–1.10)
Creatinine, Ser: 1.71 mg/dL — ABNORMAL HIGH (ref 0.50–1.10)
GFR calc Af Amer: 35 mL/min — ABNORMAL LOW (ref >90)
GFR calc non Af Amer: 32 mL/min — ABNORMAL LOW (ref >90)
Glucose, Bld: 169 mg/dL — ABNORMAL HIGH (ref 70–99)
Sodium: 146 mEq/L — ABNORMAL HIGH (ref 135–145)

## 2011-10-24 LAB — CBC
HCT: 26.8 % — ABNORMAL LOW (ref 36.0–46.0)
Hemoglobin: 9.1 g/dL — ABNORMAL LOW (ref 12.0–15.0)
MCH: 32 pg (ref 26.0–34.0)
MCHC: 34 g/dL (ref 30.0–36.0)
MCV: 94.4 fL (ref 78.0–100.0)
RBC: 2.84 MIL/uL — ABNORMAL LOW (ref 3.87–5.11)

## 2011-10-24 LAB — GLUCOSE, CAPILLARY
Glucose-Capillary: 159 mg/dL — ABNORMAL HIGH (ref 70–99)
Glucose-Capillary: 169 mg/dL — ABNORMAL HIGH (ref 70–99)
Glucose-Capillary: 259 mg/dL — ABNORMAL HIGH (ref 70–99)

## 2011-10-24 LAB — MAGNESIUM: Magnesium: 1.9 mg/dL (ref 1.5–2.5)

## 2011-10-24 MED ORDER — FUROSEMIDE 10 MG/ML IJ SOLN
80.0000 mg | Freq: Three times a day (TID) | INTRAMUSCULAR | Status: DC
  Administered 2011-10-24 (×2): 80 mg via INTRAVENOUS
  Filled 2011-10-24 (×4): qty 8

## 2011-10-24 MED ORDER — IPRATROPIUM-ALBUTEROL 18-103 MCG/ACT IN AERO
4.0000 | INHALATION_SPRAY | Freq: Four times a day (QID) | RESPIRATORY_TRACT | Status: DC
  Administered 2011-10-24 – 2011-10-28 (×15): 4 via RESPIRATORY_TRACT

## 2011-10-24 NOTE — Progress Notes (Signed)
Name: [name] MRN: [id number] DOB: 1945/05/11    LOS: 5  Referring Provider:  EDP -Patria Mane Reason for Referral:  Acute Respiratory Failure   PULMONARY / CRITICAL CARE MEDICINE  Events Since Admission: 10/8 - Admit with chest pain, respiratory failure, AMS. Intubated 10/8 10/09: episode of a-fib with chest discomfort. Started on amiodarone on 10/10 10/10: extubated  10/11: reintubated  Subjective:  Awake, answering questions Comfortable on MV Positive fluid balance last 24 hours  Vital Signs: Temp:  [96.3 F (35.7 C)-98.2 F (36.8 C)] 96.4 F (35.8 C) (10/13 0500) Pulse Rate:  [44-101] 73  (10/13 0900) Resp:  [13-25] 13  (10/13 0900) BP: (96-146)/(51-90) 131/57 mmHg (10/13 0900) SpO2:  [93 %-100 %] 99 % (10/13 0900) FiO2 (%):  [34.8 %-35.5 %] 34.9 % (10/13 0900) Weight:  [120 kg (264 lb 8.8 oz)] 120 kg (264 lb 8.8 oz) (10/13 0500)  Intake/Output Summary (Last 24 hours) at 10/24/11 0953 Last data filed at 10/24/11 0800  Gross per 24 hour  Intake 2166.9 ml  Output   1525 ml  Net  641.9 ml   Physical Examination: General:  Morbidly obese in NAD on vent Neuro:  Sedated, moves ext's spontaneously, follows commands HEENT:  Mm pink/moist, OETT Cardiovascular:  S1s2, afib Lungs:  Coarse breath sounds Abdomen:  Obese/soft, erythematous (yeast-like) rash under pannus  Musculoskeletal:  No acute deformities Skin:  Intact, rash under pannus   Active Problems:  Pulmonary edema  Hypoxemia  COPD (chronic obstructive pulmonary disease)  Atrial fibrillation  Acute on chronic diastolic heart failure  Hypokalemia   ASSESSMENT AND PLAN  PULMONARY  Lab 10/23/11 0549 10/22/11 0932 10/22/11 0513 10/21/11 0500 10/20/11 1636 10/20/11 0500 10/19/11 1736 10/19/11 1454  PHART -- 7.313* -- -- -- 7.377 7.281* 7.121*  PCO2ART -- 63.4* -- -- -- 56.4* 71.9* 120.1*  PO2ART -- 82.9 -- -- -- 85.5 86.0 163.0*  HCO3 -- 31.2* -- -- -- 32.1* 34.0* 38.8*  O2SAT 59.2 94.3 53.0 68.7 68.4  -- -- --   Ventilator Settings: Vent Mode:  [-] PRVC;PSV;CPAP FiO2 (%):  [34.8 %-35.5 %] 34.9 % Set Rate:  [18 bmp] 18 bmp Vt Set:  [500 mL] 500 mL PEEP:  [5 cmH20] 5 cmH20 Pressure Support:  [15 cmH20] 15 cmH20 Plateau Pressure:  [18 cmH20-26 cmH20] 24 cmH20 CXR:  10/8 >>>pulmonary edema 10/9 >>>pulm edema, slight improvement from 10/8 10/10: slight improvement of interstitial pulmonary edema ETT:  10/8>>>10/10 Reintubation: 10/11>>>  A:   Acute Hypercarbic Respiratory Failure, now recurrent failure 10/11 despite diuresis and adequate CVP. ? Due to OSA/OHS + anxiety, ? Contribution of bronchitis/HCAP + COPD (curently wheezing 10/12) COPD - on spiriva, symbicort, BD's at home. Currently wheezing > rx with steroids 10/12 CHF exacerbation likely contributed to initial respiratory failure, but appears to have been addressed, PAOP 13 on 10/11 P:   - continue current settings - sedated w propofol - start solumedrol 10/12 for apparent bronchospasm  CARDIOVASCULAR  Lab 10/21/11 0455 10/20/11 1606 10/20/11 0503 10/19/11 2100 10/19/11 1454 10/19/11 1453  TROPONINI -- <0.30 <0.30 <0.30 -- <0.30  LATICACIDVEN -- -- -- -- 1.5 --  PROBNP 13824.0* -- -- -- -- 9736.0*   ECG:  SR on monitor.  EKG with PVC's, no acute ST changes 10/9: PVC's, sinus tach, no ST changes Lines:  10/8 L IJ TLC>>>  Echo: 10/09: Study Conclusions  - Left ventricle: The cavity size was moderately dilated. Wall thickness was normal. Systolic function was mildly to moderately reduced. The  estimated ejection fraction was in the range of 40% to 45%. Due to first degree atrioventricular block, there was fusion of early and atrial contributions to ventricular filling. - Left atrium: The atrium was mildly dilated. - Pulmonary arteries: PA peak pressure: 31mm Hg (S).  A:  A-fib- on amiodarone drip and heparin drip Chest Pain - acute chest pain prior to arrival. Chest pain overnight. Negative trop.  Noted pulmonary  edema on cxr, but volume status appears to be improved  Hx of HTN - on norvasc, lasix at home; lasix currently held Systolic/diastolic CHF: EF:40-45%, not candidate for cath or CABG.   P:  PA-c placed 10/11 to guide volume status, assess CO Creatinine stable. Lasix added back 10/12, but positive fluid balance las 24 hours Continue amiodarone. Heparin 10/10, probably xarelto once stabilized Continue ASA  RENAL  Lab 10/24/11 0500 10/23/11 0355 10/22/11 1841 10/22/11 0300 10/21/11 2000 10/20/11 0503  NA 146* 143 144 143 143 --  K 3.9 3.3* -- -- -- --  CL 103 98 99 98 98 --  CO2 31 33* 35* 33* 36* --  BUN 48* 49* 48* 45* 45* --  CREATININE 1.62* 1.76* 1.88* 1.73* 1.75* --  CALCIUM 8.9 8.7 8.7 8.9 8.9 --  MG 1.9 -- 1.8 -- -- 1.6  PHOS 4.2 -- -- -- -- 4.2   Intake/Output      10/12 0701 - 10/13 0700 10/13 0701 - 10/14 0700   I.V. (mL/kg) 1975.9 (16.5)    Other 50    IV Piggyback 308    Total Intake(mL/kg) 2333.9 (19.4)    Urine (mL/kg/hr) 1500 (0.5) 150   Total Output 1500 150   Net +833.9 -150        Urine Occurrence 1 x    Stool Occurrence 1 x     Foley:  10/8 >>>  A:   Acute Renal Insufficiency on chronic renal insufficiency: baseline Cr: 1.5 -on chronic lasix at home  P:   - dose lasix daily based on POAP, CVP's - BMP in am  GASTROINTESTINAL  Lab 10/22/11 0300 10/21/11 0455 10/20/11 0503 10/19/11 1453  AST 26 30 50* 77*  ALT 40* 45* 60* 70*  ALKPHOS 64 54 50 66  BILITOT 0.3 0.4 0.3 0.5  PROT 6.9 6.6 6.3 7.5  ALBUMIN 2.7* 2.7* 2.6* 3.3*    A:   Morbid Obesity Mild Elevation of LFT's -??if medication related- normalizing  P:   -elevated ALT stable and trending down, follow LFT  HEMATOLOGIC  Lab 10/24/11 0500 10/23/11 0355 10/22/11 0300 10/21/11 0455 10/20/11 1607  HGB 9.1* 9.0* 9.4* 9.4* 9.7*  HCT 26.8* 28.2* 28.6* 30.1* 30.7*  PLT 233 200 240 213 222  INR -- -- -- -- --  APTT -- -- -- -- --   A:   No acute issues.  Hx of Anemia (Hg:10.5) stable     P:  -monitor cbc in the setting of heparin -anemia   INFECTIOUS  Lab 10/24/11 0500 10/23/11 0355 10/22/11 0300 10/21/11 0455 10/20/11 1607 10/20/11 0503 10/19/11 1453  WBC 4.1 5.9 9.3 10.8* 12.2* -- --  PROCALCITON -- -- -- 4.28 -- 4.24 <0.10   Cultures: 10/8 BCx2>>>no growth to date 10/8 Sputum>>> not collected 10/8 UA>>> negative 10/8 urine strep>>> not collected 10/8 urine legionella >>> not collected Antibiotics: Zosyn (?HCAP) 10/8>>> planned 10/14 Vanco (?HCAP) 10/8>>> 10/10 Diflucan (yeast under pannus) 10/8>>>10/10  A:   Bilateral airspace disease -likely edema but difficult to interperet due to body habitus Empiric  vanc and zosyn for HCAP on 10/10 P:   -cultures not collected, currently on zosyn, afebrile with normal WBC. Day 6 of 7 on 12/13, plan d/c 10/14  ENDOCRINE  Lab 10/24/11 0836 10/24/11 0328 10/23/11 2359 10/23/11 2358 10/23/11 1957  GLUCAP 167* 159* 169* 145* 160*   A:   DM - on 70/30 at home 30 units BID    A1C: 7.3 P:   -ICU SSI  NEUROLOGIC 10/8 head CT>>> no acute process A:   AMS - likely related to hypercarbia. Now back to baseline Hx anxiety / depression - on xanax, celexa at home P:   Continue to monitor   BEST PRACTICE / DISPOSITION Level of Care:  ICU Primary Service:  PCCM Consultants:  cardiology Code Status:  FULL Diet:  NPO DVT Px:  heparin gtt GI Px:  Protonix Skin Integrity:  Rash as above Social / Family:  Discussed goals of care with sisters on 10/11. Explained that she has chronic resp failure, that we may need to work towards extubation without plan for reintubation if she were to fail. Once extubated, discuss a permanent DNR order.   CC time 30 minutes  Levy Pupa, MD, PhD 10/24/2011, 9:53 AM [facility] [phone] or if no answer [phone]

## 2011-10-24 NOTE — Progress Notes (Signed)
ANTICOAGULATION CONSULT NOTE - Follow Up Consult  Pharmacy Consult for heparin Indication: atrial fibrillation  Labs:  Basename 10/24/11 0500 10/23/11 1040 10/23/11 0355 10/22/11 1841 10/22/11 0300  HGB 9.1* -- 9.0* -- --  HCT 26.8* -- 28.2* -- 28.6*  PLT 233 -- 200 -- 240  APTT -- -- -- -- --  LABPROT -- -- -- -- --  INR -- -- -- -- --  HEPARINUNFRC 0.48 0.36 0.48 -- --  CREATININE 1.62* -- 1.76* 1.88* --  CKTOTAL -- -- -- -- --  CKMB -- -- -- -- --  TROPONINI -- -- -- -- --    Assessment 67yo female with afib now therapeutic on heparin and at goal (HL= 0.48).  Noted plans for likely apixaban or rivoroxaban when patient more stable.  Goal of Therapy:  Heparin level 0.3-0.7 units/ml Platelet monitoring per protocol: yes  Plan -Continue heparin at 1950 units/hr -Heparin level and CBC daily  Harland German, Pharm D 10/24/2011 10:25 AM

## 2011-10-24 NOTE — Progress Notes (Signed)
extubated

## 2011-10-24 NOTE — Procedures (Signed)
Extubation Procedure Note  Patient Details:   Name: [name] DOB: 01/25/1945 MRN: [id number]   Airway Documentation:     Evaluation  O2 sats: stable throughout Complications: No apparent complications Patient did tolerate procedure well. Bilateral Breath Sounds: Rhonchi Suctioning: Airway Yes. Pt extubated per dr. Jarrett Ables.  Placed on 4L O2 Sats 96%  [name] V 10/24/2011, 12:11 PM

## 2011-10-24 NOTE — Progress Notes (Signed)
Advanced Heart Failure Rounding Note   Subjective:    [name] is a 66 y/o woman with multiple medical problems including morbid obesity, COPD on 4L home O2, systolic CHF with EF 30-35% DM2, HTN, CRI (baseline ~1.50 and HL. She has had multiple admissions for respiratory failure.  Was admitted in 3/13 with dyspnea thought to be due to COPD and CHF. Echo 03/24/11: LVEF 30-35% with mild concentric hypertrophy. Mild MR. LA mildly dilated. Myoview 03/25/11: No evidence for inducible ischemia. Moderate inferolateral and small distal anteroseptal fixed defects are identified and may be related to scar. Inferior and mid/distal inferolateral hypokinesia extending into the apex. Left ventricular ejection fraction of 35%. Cath deferred as she was not candidate for CABG (due to COPD) and not having angina to warrant PCI if lesion was found. Opted for medical therapy.   Came to ER 10/8 for two days of CP and progressive dyspnea. Found to be in respiratory failure. ABG 7.1/120/163/98% on facemask. Intubated in ER. Seen by p/ccm. ROS not available as patient intubated. CXR with mild pulmonary edema. pBNP 9736 (was 7300 in 6/13). WBC 18k. POC troponin < 0.30 (Baseline weight 273)  Patient awake and alert on vent; denies CP or dyspnea  CVP 12 PAD 27    Objective:   Weight Range:  Vital Signs:   Temp:  [96.3 F (35.7 C)-98.2 F (36.8 C)] 96.4 F (35.8 C) (10/13 0500) Pulse Rate:  [44-101] 63  (10/13 0700) Resp:  [14-25] 18  (10/13 0700) BP: (96-146)/(51-90) 124/59 mmHg (10/13 0700) SpO2:  [93 %-100 %] 99 % (10/13 0745) FiO2 (%):  [34.8 %-35.5 %] 35.1 % (10/13 0748) Weight:  [264 lb 8.8 oz (120 kg)] 264 lb 8.8 oz (120 kg) (10/13 0500) Last BM Date: 10/23/11  Weight change: Filed Weights   10/22/11 0500 10/23/11 0500 10/24/11 0500  Weight: 268 lb 11.9 oz (121.9 kg) 264 lb 8.8 oz (120 kg) 264 lb 8.8 oz (120 kg)    Intake/Output:   Intake/Output Summary (Last 24 hours) at 10/24/11 0916 Last data  filed at 10/24/11 0700  Gross per 24 hour  Intake 2166.9 ml  Output   1375 ml  Net  791.9 ml     Physical Exam: CVP 12-13 General: Morbidly obese. Awake on vent Neuro: Awake, alert.  HEENT: Normal except for ETT  Cardiovascular:  Distant HS s1s2 RRR Lungs:Mild wheeze Abdomen: Obese/soft,  Nontender. Good BS  Musculoskeletal: No acute deformities  Extremities: no edema     Telemetry: Sinus rhythm  Labs: Basic Metabolic Panel:  Lab 10/24/11 4098 10/23/11 0355 10/22/11 1841 10/22/11 0300 10/21/11 2000 10/20/11 0503  NA 146* 143 144 143 143 --  K 3.9 3.3* 3.5 3.4* 3.6 --  CL 103 98 99 98 98 --  CO2 31 33* 35* 33* 36* --  GLUCOSE 169* 124* 115* 128* 221* --  BUN 48* 49* 48* 45* 45* --  CREATININE 1.62* 1.76* 1.88* 1.73* 1.75* --  CALCIUM 8.9 8.7 8.7 -- -- --  MG 1.9 -- 1.8 -- -- 1.6  PHOS 4.2 -- -- -- -- 4.2    Liver Function Tests:  Lab 10/22/11 0300 10/21/11 0455 10/20/11 0503 10/19/11 1453  AST 26 30 50* 77*  ALT 40* 45* 60* 70*  ALKPHOS 64 54 50 66  BILITOT 0.3 0.4 0.3 0.5  PROT 6.9 6.6 6.3 7.5  ALBUMIN 2.7* 2.7* 2.6* 3.3*   CBC:  Lab 10/24/11 0500 10/23/11 0355 10/22/11 0300 10/21/11 0455 10/20/11 1607 10/19/11 1453  WBC 4.1 5.9 9.3 10.8* 12.2* --  NEUTROABS -- -- -- 8.9* -- 13.4*  HGB 9.1* 9.0* 9.4* 9.4* 9.7* --  HCT 26.8* 28.2* 28.6* 30.1* 30.7* --  MCV 94.4 94.3 95.7 94.4 92.2 --  PLT 233 200 240 213 222 --    Cardiac Enzymes:  Lab 10/20/11 1606 10/20/11 0503 10/19/11 2100 10/19/11 1453  CKTOTAL -- -- -- --  CKMB -- -- -- --  CKMBINDEX -- -- -- --  TROPONINI <0.30 <0.30 <0.30 <0.30    BNP: BNP (last 3 results)  Basename 10/21/11 0455 10/19/11 1453 06/26/11 0526  PROBNP 13824.0* 9736.0* 7258.0*     Other results:     Imaging: Dg Chest Port 1 View  10/24/2011  *RADIOLOGY REPORT*  Clinical Data: Evaluate endotracheal tube, lines  PORTABLE CHEST - 1 VIEW  Comparison: 10/23/2011  Findings: Endotracheal tube terminates 3 cm above the  carina.  Right IJ Swan-Ganz catheter with its tip in the right lower lobe pulmonary artery.  Left IJ venous cath with its tip in the SVC.  Cardiomegaly.  Pulmonary vascular congestion.  No frank interstitial edema.  No pleural effusion or pneumothorax.  IMPRESSION: Endotracheal tube terminates 3 cm above the carina.  Additional support apparatus as above.  Cardiomegaly.  Pulmonary vascular congestion without frank interstitial edema.   Original Report Authenticated By: Charline Bills, M.D.    Dg Chest Port 1 View  10/23/2011  *RADIOLOGY REPORT*  Clinical Data: Endotracheal tube, concern for pulmonary edema  PORTABLE CHEST - 1 VIEW  Comparison: 10/22/2011  Findings: Endotracheal tube terminates 6 cm above the carina.  Right IJ Swan-Ganz catheter with its tip in the right lower lobe pulmonary artery.  Withdrawal approximately 3 cm is suggested.  Stable left IJ venous catheter with its tip in the SVC.  Prior vascular congestion with suspected mild interstitial edema. Right lung base is excluded.  Stable cardiomegaly.  IMPRESSION: Endotracheal tube terminates 6 cm above the carina.  Right IJ Swan-Ganz catheter with its tip in the right lower lobe pulmonary artery.  Withdrawal approximately 3 cm is suggested.  Cardiomegaly with suspected mild interstitial edema, unchanged.   Original Report Authenticated By: Charline Bills, M.D.    Dg Chest Port 1 View  10/22/2011  *RADIOLOGY REPORT*  Clinical Data: Central line placement intubation  PORTABLE CHEST - 1 VIEW  Comparison: Portable exam 1129 hours compared to 10/22/2011 at the 0714 hours  Findings: Tip of endotracheal tube 5.8 cm above carina. New right jugular Swan-Ganz catheter, tip at descending interlobar pulmonary artery. Left jugular central venous catheter, tip projecting over SVC. Enlargement of cardiac silhouette with slight pulmonary vascular congestion. Minimal perihilar edema. No pleural effusion or pneumothorax. Bones demineralized.  IMPRESSION: No  pneumothorax following central line placement. Enlargement of cardiac silhouette with pulmonary vascular congestion and mild perihilar edema, slightly decreased since previous exam.   Original Report Authenticated By: Lollie Marrow, M.D.      Medications:     Scheduled Medications:    . albuterol-ipratropium  6 puff Inhalation Q6H  . antiseptic oral rinse  15 mL Mouth Rinse QID  . chlorhexidine  15 mL Mouth Rinse BID  . Chlorhexidine Gluconate Cloth  6 each Topical Q0600  . ferrous sulfate  325 mg Oral Q breakfast  . furosemide  80 mg Intravenous BID  . influenza  inactive virus vaccine  0.5 mL Intramuscular Tomorrow-1000  . insulin aspart  2-6 Units Subcutaneous Q4H  . methylPREDNISolone (SOLU-MEDROL) injection  60 mg Intravenous Q6H  .  mupirocin ointment   Nasal BID  . pantoprazole (PROTONIX) IV  40 mg Intravenous QHS  . piperacillin-tazobactam (ZOSYN)  IV  3.375 g Intravenous Q8H  . pneumococcal 23 valent vaccine  0.5 mL Intramuscular Tomorrow-1000  . potassium chloride  10 mEq Intravenous Q1 Hr x 4    Infusions:    . amiodarone (NEXTERONE PREMIX) 360 mg/200 mL dextrose 0.5 mg/min (10/24/11 0500)  . heparin 1,950 Units/hr (10/24/11 0200)  . propofol 15 mcg/kg/min (10/24/11 0000)    PRN Medications: sodium chloride, albuterol, ALPRAZolam, fentaNYL   Assessment:  1. A/C respiratory failure  2. A/c systolic HF EF 30-35%  3. O2 dependent COPD  4. Chronic renal failure  5. DM2  6. Morbid obesity  7. Chest pain with negative troponins 8. Anemia 9. A/C renal failure 10. AF with RVR   Plan/Discussion:     She now has recurrent respiratory failure. This is clearly multifactorial due to severe COPD, obesity hypoventilation syndrome, anxiety and diastolic/systolic HF. (EF 40-45%)  CVP 12; Will continue lasix at present dose and follow renal function.   Continue amiodarone and heparin for atrial fibrillation; possible extubation today.   Length of Stay: 5 Brian  Crenshaw,MD 9:16 AM

## 2011-10-25 ENCOUNTER — Inpatient Hospital Stay (HOSPITAL_COMMUNITY): Payer: Medicare Other

## 2011-10-25 DIAGNOSIS — R9439 Abnormal result of other cardiovascular function study: Secondary | ICD-10-CM

## 2011-10-25 DIAGNOSIS — R109 Unspecified abdominal pain: Secondary | ICD-10-CM

## 2011-10-25 LAB — BASIC METABOLIC PANEL
BUN: 52 mg/dL — ABNORMAL HIGH (ref 6–23)
Chloride: 98 mEq/L (ref 96–112)
Creatinine, Ser: 1.84 mg/dL — ABNORMAL HIGH (ref 0.50–1.10)
GFR calc Af Amer: 32 mL/min — ABNORMAL LOW (ref >90)
GFR calc non Af Amer: 27 mL/min — ABNORMAL LOW (ref >90)
Glucose, Bld: 204 mg/dL — ABNORMAL HIGH (ref 70–99)

## 2011-10-25 LAB — CBC
HCT: 29.3 % — ABNORMAL LOW (ref 36.0–46.0)
MCH: 28.8 pg (ref 26.0–34.0)
MCV: 92.7 fL (ref 78.0–100.0)
Platelets: 237 10*3/uL (ref 150–400)
RBC: 3.16 MIL/uL — ABNORMAL LOW (ref 3.87–5.11)

## 2011-10-25 LAB — CULTURE, BLOOD (ROUTINE X 2): Culture: NO GROWTH

## 2011-10-25 LAB — GLUCOSE, CAPILLARY
Glucose-Capillary: 177 mg/dL — ABNORMAL HIGH (ref 70–99)
Glucose-Capillary: 195 mg/dL — ABNORMAL HIGH (ref 70–99)
Glucose-Capillary: 196 mg/dL — ABNORMAL HIGH (ref 70–99)

## 2011-10-25 MED ORDER — BIOTENE DRY MOUTH MT LIQD: 15.0000 mL | Freq: Four times a day (QID) | OROMUCOSAL | Status: DC

## 2011-10-25 MED ORDER — MIDAZOLAM HCL 2 MG/2ML IJ SOLN
1.0000 mg | INTRAMUSCULAR | Status: DC | PRN
  Administered 2011-10-25 (×2): 2 mg via INTRAVENOUS
  Administered 2011-10-25: 1 mg via INTRAVENOUS
  Administered 2011-10-26 – 2011-10-27 (×5): 2 mg via INTRAVENOUS
  Administered 2011-10-27: 1 mg via INTRAVENOUS
  Administered 2011-10-28 – 2011-10-30 (×10): 2 mg via INTRAVENOUS
  Filled 2011-10-25 (×11): qty 2

## 2011-10-25 MED ORDER — MIDAZOLAM HCL 2 MG/2ML IJ SOLN
INTRAMUSCULAR | Status: AC
  Filled 2011-10-25: qty 4

## 2011-10-25 MED ORDER — MIDAZOLAM HCL 2 MG/2ML IJ SOLN
2.0000 mg | Freq: Once | INTRAMUSCULAR | Status: AC
  Administered 2011-10-25: 2 mg via INTRAVENOUS

## 2011-10-25 MED ORDER — CHLORHEXIDINE GLUCONATE 0.12 % MT SOLN: 15.0000 mL | Freq: Two times a day (BID) | OROMUCOSAL | Status: DC

## 2011-10-25 MED ORDER — ASPIRIN 81 MG PO CHEW: 324.0000 mg | CHEWABLE_TABLET | ORAL | Status: DC

## 2011-10-25 MED ORDER — AMIODARONE HCL 200 MG PO TABS
200.0000 mg | ORAL_TABLET | Freq: Two times a day (BID) | ORAL | Status: DC
  Administered 2011-10-25: 200 mg via ORAL
  Filled 2011-10-25 (×6): qty 1

## 2011-10-25 MED ORDER — METOPROLOL TARTRATE 1 MG/ML IV SOLN
INTRAVENOUS | Status: AC
  Administered 2011-10-25: 5 mg
  Filled 2011-10-25: qty 10

## 2011-10-25 MED ORDER — TORSEMIDE 20 MG PO TABS
20.0000 mg | ORAL_TABLET | Freq: Two times a day (BID) | ORAL | Status: DC
  Administered 2011-10-25: 20 mg via ORAL
  Filled 2011-10-25 (×7): qty 1

## 2011-10-25 MED ORDER — SODIUM CHLORIDE 0.9 % IJ SOLN: 3.0000 mL | INTRAMUSCULAR | Status: DC | PRN

## 2011-10-25 MED ORDER — MIDAZOLAM BOLUS VIA INFUSION
1.0000 mg | Freq: Once | INTRAVENOUS | Status: DC
  Filled 2011-10-25: qty 1

## 2011-10-25 MED ORDER — NITROGLYCERIN IN D5W 200-5 MCG/ML-% IV SOLN
2.0000 ug/min | INTRAVENOUS | Status: DC
  Administered 2011-10-25: 5 ug/min via INTRAVENOUS
  Filled 2011-10-25 (×2): qty 250

## 2011-10-25 MED ORDER — CITALOPRAM HYDROBROMIDE 20 MG PO TABS
20.0000 mg | ORAL_TABLET | Freq: Every day | ORAL | Status: DC
Start: 2011-10-25 — End: 2011-11-01
  Administered 2011-10-27 – 2011-11-01 (×6): 20 mg via ORAL
  Filled 2011-10-25 (×8): qty 1

## 2011-10-25 MED ORDER — MIDAZOLAM HCL 2 MG/2ML IJ SOLN
2.0000 mg | Freq: Once | INTRAMUSCULAR | Status: DC
  Filled 2011-10-25 (×9): qty 2

## 2011-10-25 MED ORDER — SODIUM CHLORIDE 0.9 % IV SOLN: 250.0000 mL | INTRAVENOUS | Status: DC | PRN

## 2011-10-25 MED ORDER — SODIUM CHLORIDE 0.9 % IJ SOLN
3.0000 mL | Freq: Two times a day (BID) | INTRAMUSCULAR | Status: DC
  Administered 2011-10-25: 3 mL via INTRAVENOUS
  Administered 2011-10-26: 10:00:00 via INTRAVENOUS

## 2011-10-25 MED ORDER — MIDAZOLAM HCL 2 MG/2ML IJ SOLN
INTRAMUSCULAR | Status: AC
  Administered 2011-10-25: 2 mg via INTRAVENOUS
  Filled 2011-10-25: qty 2

## 2011-10-25 MED ORDER — FENTANYL CITRATE 0.05 MG/ML IJ SOLN
50.0000 ug | Freq: Once | INTRAMUSCULAR | Status: AC
  Administered 2011-10-25: 50 ug via INTRAVENOUS

## 2011-10-25 MED ORDER — POTASSIUM CHLORIDE CRYS ER 20 MEQ PO TBCR
40.0000 meq | EXTENDED_RELEASE_TABLET | Freq: Every day | ORAL | Status: DC
  Administered 2011-10-25: 40 meq via ORAL
  Filled 2011-10-25 (×2): qty 2

## 2011-10-25 MED ORDER — MIDAZOLAM HCL 2 MG/2ML IJ SOLN
1.0000 mg | Freq: Once | INTRAMUSCULAR | Status: AC
  Administered 2011-10-25: 1 mg via INTRAVENOUS
  Filled 2011-10-25: qty 2

## 2011-10-25 MED ORDER — FENTANYL CITRATE 0.05 MG/ML IJ SOLN
INTRAMUSCULAR | Status: AC
  Administered 2011-10-25: 100 ug
  Filled 2011-10-25: qty 4

## 2011-10-25 MED ORDER — ETOMIDATE 2 MG/ML IV SOLN
10.0000 mg | Freq: Once | INTRAVENOUS | Status: AC
  Administered 2011-10-25: 10 mg via INTRAVENOUS

## 2011-10-25 MED ORDER — FENTANYL CITRATE 0.05 MG/ML IJ SOLN
100.0000 ug | Freq: Once | INTRAMUSCULAR | Status: AC
  Administered 2011-10-25: 50 ug via INTRAVENOUS

## 2011-10-25 NOTE — Progress Notes (Signed)
Pt refuses ABG per Caesar Chestnut, RRT.  Pt also refuses OG tube. Dr Herma Carson notified.

## 2011-10-25 NOTE — Progress Notes (Addendum)
Name: [name] MRN: [id number] DOB: 1945/12/22    LOS: 6  Referring Provider:  EDP -Patria Mane Reason for Referral:  Acute Respiratory Failure   PULMONARY / CRITICAL CARE MEDICINE 66 yo female with h/o COPD (4L at home), DM2, systolic CHF (EF: 40-45%), morbid obesity, Chronic Renal Insufficiency (Cr baseline:1.5) who presented in acute respiratory failure and was intubated.   Events Since Admission: 10/8 - Admit with chest pain, respiratory failure, AMS. Intubated 10/8 10/09: episode of a-fib with chest discomfort. Started on amiodarone on 10/10 10/10: extubated  10/11: reintubated 10/13: extubated  Subjective:  Extubated 10/13. On 5L [place] since then.  Vital Signs: Pulse Rate:  [44-97] 94  (10/14 0400) Resp:  [12-21] 16  (10/14 0400) BP: (114-153)/(54-101) 139/69 mmHg (10/14 0400) SpO2:  [94 %-100 %] 94 % (10/14 0400) FiO2 (%):  [34.9 %-35.2 %] 34.9 % (10/13 1200) Weight:  [270 lb 1 oz (122.5 kg)] 270 lb 1 oz (122.5 kg) (10/14 0442)  Intake/Output Summary (Last 24 hours) at 10/25/11 0645 Last data filed at 10/24/11 2100  Gross per 24 hour  Intake   1146 ml  Output   1785 ml  Net   -639 ml   Physical Examination: General:  Morbidly obese mildly anxious appearing, on 5L [place] Neuro:  Alert and oriented x4 HEENT:  Mm pink/moist Cardiovascular:  S1s2, regular rhythm Lungs:  Coarse breath sounds Abdomen:  Obese/soft, non tender Musculoskeletal:  No acute deformities Skin:  Intact, rash under pannus   Active Problems:  Pulmonary edema  Hypoxemia  COPD (chronic obstructive pulmonary disease)  Atrial fibrillation  Acute on chronic diastolic heart failure  Hypokalemia   ASSESSMENT AND PLAN  PULMONARY  Lab 10/23/11 0549 10/22/11 0932 10/22/11 0513 10/21/11 0500 10/20/11 1636 10/20/11 0500 10/19/11 1736 10/19/11 1454  PHART -- 7.313* -- -- -- 7.377 7.281* 7.121*  PCO2ART -- 63.4* -- -- -- 56.4* 71.9* 120.1*  PO2ART -- 82.9 -- -- -- 85.5 86.0 163.0*  HCO3 -- 31.2* -- --  -- 32.1* 34.0* 38.8*  O2SAT 59.2 94.3 53.0 68.7 68.4 -- -- --   Ventilator Settings: Vent Mode:  [-] PRVC;PSV;CPAP FiO2 (%):  [34.9 %-35.2 %] 34.9 % Vt Set:  [500 mL] 500 mL PEEP:  [5 cmH20] 5 cmH20 Pressure Support:  [15 cmH20] 15 cmH20 CXR:  10/8 >>>pulmonary edema 10/9 >>>pulm edema, slight improvement from 10/8 10/10: slight improvement of interstitial pulmonary edema  ETT:  10/8>>>10/10 Reintubation: 10/11>>>10/13  A:   Acute Hypercarbic Respiratory Failure, now recurrent failure 10/11 despite diuresis and adequate CVP. ? Due to OSA/OHS + anxiety, ? Contribution of bronchitis/HCAP + COPD (curently wheezing 10/12) COPD - on spiriva, symbicort, BD's at home.  rx with steroids 10/12 for bronchospasm CHF exacerbation likely contributed to initial respiratory failure, but appears to have been addressed P:   - s/p extubation, on 5L. Continue current management -will evalaute needs nocturnal CPAP / BIPAP? -BDers -keep neg balance goals -pcxr in am  -will discuss reintubation status  CARDIOVASCULAR  Lab 10/21/11 0455 10/20/11 1606 10/20/11 0503 10/19/11 2100 10/19/11 1454 10/19/11 1453  TROPONINI -- <0.30 <0.30 <0.30 -- <0.30  LATICACIDVEN -- -- -- -- 1.5 --  PROBNP 13824.0* -- -- -- -- 9736.0*   ECG:  SR on monitor.  EKG with PVC's, no acute ST changes 10/9: PVC's, sinus tach, no ST changes 10/10: new onset atrial fibrillation  Lines:  10/8 L IJ TLC>>> 10/11 Right IJ Theone Murdoch >>>  Echo: 10/09: Study Conclusions  - Left  ventricle: The cavity size was moderately dilated. Wall thickness was normal. Systolic function was mildly to moderately reduced. The estimated ejection fraction was in the range of 40% to 45%. Due to first degree atrioventricular block, there was fusion of early and atrial contributions to ventricular filling. - Left atrium: The atrium was mildly dilated. - Pulmonary arteries: PA peak pressure: 31mm Hg (S).  A:  A-fib- on amiodarone drip and  heparin drip Chest Pain - acute chest pain prior to arrival. Chest pain overnight. Negative trop.  Noted pulmonary edema on cxr, but volume status appears to be improved  Hx of HTN - on norvasc, lasix at home Systolic/diastolic CHF: EF:40-45%, not candidate for cath or CABG.   P:  PA-c placed 10/11 to guide volume status, assess CO:4.9 l/min, wedge 17, consider dc pa catheter , volume status known at this stage Lasix added back 10/12. Currently at 80mg  IV tid. Consider decreasing given increase in creatinine and negative fluid status.  Continue amiodarone. Heparin 10/10, probably xarelto once stabilized Continue ASA  RENAL  Lab 10/25/11 0430 10/24/11 1900 10/24/11 0500 10/23/11 0355 10/22/11 1841 10/20/11 0503  NA 143 144 146* 143 144 --  K 3.5 3.5 -- -- -- --  CL 98 98 103 98 99 --  CO2 35* 33* 31 33* 35* --  BUN 52* 50* 48* 49* 48* --  CREATININE 1.84* 1.71* 1.62* 1.76* 1.88* --  CALCIUM 8.9 9.2 8.9 8.7 8.7 --  MG 1.9 -- 1.9 -- 1.8 1.6  PHOS 4.0 -- 4.2 -- -- 4.2   Intake/Output      10/13 0701 - 10/14 0700   P.O. 240   I.V. (mL/kg) 810.8 (6.6)   IV Piggyback 8   Total Intake(mL/kg) 1058.8 (8.6)   Urine (mL/kg/hr) 1785 (0.6)   Total Output 1785   Net -726.2       Stool Occurrence 2 x    Foley:  10/8 >>>  A:   Acute Renal Insufficiency on chronic renal insufficiency: baseline Cr: 1.5 -on chronic lasix at home  P:   - decrease lasix dose - BMP in am -chem in am   GASTROINTESTINAL  Lab 10/22/11 0300 10/21/11 0455 10/20/11 0503 10/19/11 1453  AST 26 30 50* 77*  ALT 40* 45* 60* 70*  ALKPHOS 64 54 50 66  BILITOT 0.3 0.4 0.3 0.5  PROT 6.9 6.6 6.3 7.5  ALBUMIN 2.7* 2.7* 2.6* 3.3*    A:   Morbid Obesity Mild Elevation of LFT's -??if medication related- normalizing  P:   -elevated ALT stable and trending down, follow LFT - diet full liquid, advance  HEMATOLOGIC  Lab 10/25/11 0430 10/24/11 0500 10/23/11 0355 10/22/11 0300 10/21/11 0455  HGB 9.1* 9.1* 9.0*  9.4* 9.4*  HCT 29.3* 26.8* 28.2* 28.6* 30.1*  PLT 237 233 200 240 213  INR -- -- -- -- --  APTT -- -- -- -- --   A:   No acute issues.  Hx of Anemia (Hg:10.5) stable   P:  -monitor cbc in the setting of heparin -heparin drip  INFECTIOUS  Lab 10/25/11 0430 10/24/11 0500 10/23/11 0355 10/22/11 0300 10/21/11 0455 10/20/11 0503 10/19/11 1453  WBC 7.5 4.1 5.9 9.3 10.8* -- --  PROCALCITON -- -- -- -- 4.28 4.24 <0.10   Cultures: 10/8 BCx2>>>no growth to date 10/8 Sputum>>> not collected 10/8 UA>>> negative 10/8 urine strep>>> not collected 10/8 urine legionella >>> not collected Antibiotics: Zosyn (?HCAP) 10/8>>> planned 10/14 Vanco (?HCAP) 10/8>>> 10/10 Diflucan (yeast under  pannus) 10/8>>>10/10  A:   Bilateral airspace disease -likely edema but difficult to interperet due to body habitus Empiric vanc and zosyn for HCAP on 10/10. Vanc d/ced 10/10. Currently on zosyn P:   -cultures not collected, currently on zosyn, afebrile with normal WBC. Discontinue zosyn No evidence infection  ENDOCRINE  Lab 10/25/11 0427 10/25/11 0012 10/24/11 2137 10/24/11 1722 10/24/11 1205  GLUCAP 177* 195* 259* 144* 160*   A:   DM - on 70/30 at home 30 units BID    A1C: 7.3 P:   -ICU SSI Low temp prior, tsh x 1  NEUROLOGIC 10/8 head CT>>> no acute process A:   AMS - likely related to hypercarbia. Now back to baseline Hx anxiety / depression - on xanax, celexa at home P:   Continue fentanyl and alprazolam Anxiety, - assess tsh Consider restart home ssri  BEST PRACTICE / DISPOSITION Level of Care:  ICU Primary Service:  PCCM Consultants:  cardiology Code Status:  FULL Diet: full liquid DVT Px:  heparin gtt GI Px:  Protonix Skin Integrity:  Rash as above Social / Family: Will discuss pt wishes reintubation   Update: Acute resp failure, purse lip, tachy, HTN Attempting NIMV, bipap, reducing afterload, assessment rhythm Likely will require intubation. I spoke to her  This am ,  she said she wanted ETT again if needed.  Additional management life threatening circumstance, resp failure, total now 45 min .   Mcarthur Rossetti. Tyson Alias, MD, FACP Pgr: [phone] [facility]

## 2011-10-25 NOTE — Procedures (Signed)
Intubation Procedure Note [name] [id number] [birth year]  Procedure: Intubation Indications: Respiratory insufficiency  Procedure Details Consent: Unable to obtain consent because of emergent medical necessity. Time Out: Verified patient identification, verified procedure, site/side was marked, verified correct patient position, special equipment/implants available, medications/allergies/relevent history reviewed, required imaging and test results available.  Performed  MAC 4   Evaluation Hemodynamic Status: BP stable throughout; O2 sats: stable throughout Patient's Current Condition: stable Complications: No apparent complications Patient did tolerate procedure well. Chest X-ray ordered to verify placement.  CXR: pending.   Leveda Kendrix 10/25/2011

## 2011-10-25 NOTE — Progress Notes (Signed)
ANTICOAGULATION CONSULT NOTE - Follow Up Consult  Pharmacy Consult for Heparin Indication: atrial fibrillation  Allergies  Allergen Reactions  . Pioglitazone     REACTION: edema  . Rosiglitazone Maleate Nausea And Vomiting    Patient Measurements: Height: 5\' 6"  (167.6 cm) Weight: 270 lb 1 oz (122.5 kg) IBW/kg (Calculated) : 59.3    Vital Signs: BP: 137/75 mmHg (10/14 0700) Pulse Rate: 101  (10/14 0700)  Labs:  Basename 10/25/11 0430 10/24/11 1900 10/24/11 0500 10/23/11 1040 10/23/11 0355  HGB 9.1* -- 9.1* -- --  HCT 29.3* -- 26.8* -- 28.2*  PLT 237 -- 233 -- 200  APTT -- -- -- -- --  LABPROT -- -- -- -- --  INR -- -- -- -- --  HEPARINUNFRC 0.58 -- 0.48 0.36 --  CREATININE 1.84* 1.71* 1.62* -- --  CKTOTAL -- -- -- -- --  CKMB -- -- -- -- --  TROPONINI -- -- -- -- --    Estimated Creatinine Clearance: 40.2 ml/min (by C-G formula based on Cr of 1.84).   Assessment: 66 yo female with Afib, now therapeutic on heparin and at goal (HL = 0.58). Noted plans for apixaban or rivaroxaban when patient is more stable. No reported bleeding. Hgb 9.1, HCT 29.3, platelets 237, all stable.   Goal of Therapy:  Heparin level 0.3-0.7 units/ml Platelet monitoring per protocol: yes   Plan:  -Continue heparin at 1950 units/hr -Heparin level and CBC daily.   Juanita Craver PharmD Candidate 10/25/2011,8:52 AM    Estella Husk, Pharm.D., BCPS Clinical Pharmacist  Phone [phone] Pager [phone] 10/25/2011, 10:59 AM

## 2011-10-25 NOTE — Progress Notes (Signed)
[facility] ADULT ICU REPLACEMENT PROTOCOL FOR AM LAB REPLACEMENT ONLY  The patient does not apply for the [facility] Adult ICU Electrolyte Replacment Protocol based on the criteria listed below:   1. Is GFR >/= 50 ml/min? no  Patient's GFR today is 27  3. Is BUN < 30 mg/dL? no  Patient's BUN today is52 4. Abnormal electrolyte(s):K-3.5  Physician:  Dr Detterding  Llana Aliment 10/25/2011 5:42 AM
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
## 2011-10-25 NOTE — Progress Notes (Signed)
Events from today noted. Reintubated for respiratory distress. PCWP at the time was ~50.   Now awake on vent. I measured PCWP myself and now about 25.   Long discussion with her and her son Stephen about the fact that coronary ischemia may be playing a role as she had + stress test recently and her respiratory decompensations are associated with increasing LV stiffness and marked PCWP elevations.   Given 2nd re-intubation I think we need to definitively assess whether or not she has critical CAD. She is not CABG candidate but may be able to help with PCI if she has high grade proximal lesion.   Did discuss risks of cath including worsening renal dysfunction and HD. They are ok with proceeding. Will schedule for 9a tomorrow.   D/w Dr. Feinstein.  Elin Seats,MD 5:49 PM   

## 2011-10-25 NOTE — Progress Notes (Addendum)
Advanced Heart Failure Rounding Note   Subjective:    [name] is a 66 y/o woman with multiple medical problems including morbid obesity, COPD on 4L home O2, systolic CHF with EF 30-35% DM2, HTN, CRI (baseline ~1.50 and HL. She has had multiple admissions for respiratory failure.  Was admitted in 3/13 with dyspnea thought to be due to COPD and CHF. Echo 03/24/11: LVEF 30-35% with mild concentric hypertrophy. Mild MR. LA mildly dilated. Myoview 03/25/11: No evidence for inducible ischemia. Moderate inferolateral and small distal anteroseptal fixed defects are identified and may be related to scar. Inferior and mid/distal inferolateral hypokinesia extending into the apex. Left ventricular ejection fraction of 35%. Cath deferred as she was not candidate for CABG (due to COPD) and not having angina to warrant PCI if lesion was found. Opted for medical therapy.   Came to ER 10/8 for two days of CP and progressive dyspnea. Found to be in respiratory failure. ABG 7.1/120/163/98% on facemask. Intubated in ER for PNA +/- diastolic HF (Baseline weight 273)  Developed AF -> amio. Re-intubated 10/11 for recurrent resp failure. PCWP initially 13.   Extubated 10/13. Feels better. Eager to eat/drink. Very anxious.  CVP 14 PA 69/37 (51) PCWP 17    Objective:   Weight Range:  Vital Signs:   Pulse Rate:  [44-101] 101  (10/14 0700) Resp:  [12-23] 23  (10/14 0700) BP: (114-153)/(54-101) 137/75 mmHg (10/14 0700) SpO2:  [88 %-100 %] 88 % (10/14 0700) FiO2 (%):  [34.9 %-35.2 %] 34.9 % (10/13 1200) Weight:  [122.5 kg (270 lb 1 oz)] 122.5 kg (270 lb 1 oz) (10/14 0442) Last BM Date: 10/24/11  Weight change: Filed Weights   10/23/11 0500 10/24/11 0500 10/25/11 0442  Weight: 120 kg (264 lb 8.8 oz) 120 kg (264 lb 8.8 oz) 122.5 kg (270 lb 1 oz)    Intake/Output:   Intake/Output Summary (Last 24 hours) at 10/25/11 0738 Last data filed at 10/24/11 2100  Gross per 24 hour  Intake 1058.8 ml  Output   1785  ml  Net -726.2 ml     Physical Exam: CVP 12-13 General: Morbidly obese. Anxious. Dyspneic. White knuckling the bed Neuro: Awake, alert. Follows commands HEENT: Normal  Cardiovascular: PMI nonpalpable. Distant HS s1s2 Regular Lungs:Mild rhonchi Abdomen: Obese/soft,  Nontender. Good BS  Musculoskeletal: No acute deformities  Skin: Intact, rash under pannus, long unkempt toenails  Extremities: tr edema RLE/LLE , RUE art line in place . PAS in place     Telemetry: Sinus rhythm  Labs: Basic Metabolic Panel:  Lab 10/25/11 1610 10/24/11 1900 10/24/11 0500 10/23/11 0355 10/22/11 1841 10/20/11 0503  NA 143 144 146* 143 144 --  K 3.5 3.5 3.9 3.3* 3.5 --  CL 98 98 103 98 99 --  CO2 35* 33* 31 33* 35* --  GLUCOSE 204* 236* 169* 124* 115* --  BUN 52* 50* 48* 49* 48* --  CREATININE 1.84* 1.71* 1.62* 1.76* 1.88* --  CALCIUM 8.9 9.2 8.9 -- -- --  MG 1.9 -- 1.9 -- 1.8 1.6  PHOS 4.0 -- 4.2 -- -- 4.2    Liver Function Tests:  Lab 10/22/11 0300 10/21/11 0455 10/20/11 0503 10/19/11 1453  AST 26 30 50* 77*  ALT 40* 45* 60* 70*  ALKPHOS 64 54 50 66  BILITOT 0.3 0.4 0.3 0.5  PROT 6.9 6.6 6.3 7.5  ALBUMIN 2.7* 2.7* 2.6* 3.3*   No results found for this basename: LIPASE:5,AMYLASE:5 in the last 168 hours No results  found for this basename: AMMONIA:3 in the last 168 hours  CBC:  Lab 10/25/11 0430 10/24/11 0500 10/23/11 0355 10/22/11 0300 10/21/11 0455 10/19/11 1453  WBC 7.5 4.1 5.9 9.3 10.8* --  NEUTROABS -- -- -- -- 8.9* 13.4*  HGB 9.1* 9.1* 9.0* 9.4* 9.4* --  HCT 29.3* 26.8* 28.2* 28.6* 30.1* --  MCV 92.7 94.4 94.3 95.7 94.4 --  PLT 237 233 200 240 213 --    Cardiac Enzymes:  Lab 10/20/11 1606 10/20/11 0503 10/19/11 2100 10/19/11 1453  CKTOTAL -- -- -- --  CKMB -- -- -- --  CKMBINDEX -- -- -- --  TROPONINI <0.30 <0.30 <0.30 <0.30    BNP: BNP (last 3 results)  Basename 10/21/11 0455 10/19/11 1453 06/26/11 0526  PROBNP 13824.0* 9736.0* 7258.0*     Other results:      Imaging: Dg Chest Port 1 View  10/25/2011  *RADIOLOGY REPORT*  Clinical Data: Infiltrate  PORTABLE CHEST - 1 VIEW  Comparison: 10/24/2011  Findings: Cardiomegaly again noted.  Endotracheal tube has been removed.  Persistent central vascular congestion. Central mild interstitial prominence.  Mild basilar atelectasis.  Right IJ Swan-Ganz catheter with tip in the right lower lobe pulmonary artery is unchanged in position.  Stable left IJ central line position.  No segmental infiltrate. No diagnostic pneumothorax.  IMPRESSION: Endotracheal tube has been removed.  No diagnostic pneumothorax. Stable support apparatus.  Central mild vascular congestion and mild perihilar interstitial prominence.  Mild basilar atelectasis. No segmental infiltrate.   Original Report Authenticated By: Natasha Mead, M.D.    Dg Chest Port 1 View  10/24/2011  *RADIOLOGY REPORT*  Clinical Data: Evaluate endotracheal tube, lines  PORTABLE CHEST - 1 VIEW  Comparison: 10/23/2011  Findings: Endotracheal tube terminates 3 cm above the carina.  Right IJ Swan-Ganz catheter with its tip in the right lower lobe pulmonary artery.  Left IJ venous cath with its tip in the SVC.  Cardiomegaly.  Pulmonary vascular congestion.  No frank interstitial edema.  No pleural effusion or pneumothorax.  IMPRESSION: Endotracheal tube terminates 3 cm above the carina.  Additional support apparatus as above.  Cardiomegaly.  Pulmonary vascular congestion without frank interstitial edema.   Original Report Authenticated By: Charline Bills, M.D.      Medications:     Scheduled Medications:    . albuterol-ipratropium  4 puff Inhalation Q6H  . antiseptic oral rinse  15 mL Mouth Rinse QID  . chlorhexidine  15 mL Mouth Rinse BID  . Chlorhexidine Gluconate Cloth  6 each Topical Q0600  . ferrous sulfate  325 mg Oral Q breakfast  . furosemide  80 mg Intravenous TID  . influenza  inactive virus vaccine  0.5 mL Intramuscular Tomorrow-1000  . insulin aspart   2-6 Units Subcutaneous Q4H  . methylPREDNISolone (SOLU-MEDROL) injection  60 mg Intravenous Q6H  . mupirocin ointment   Nasal BID  . pantoprazole (PROTONIX) IV  40 mg Intravenous QHS  . piperacillin-tazobactam (ZOSYN)  IV  3.375 g Intravenous Q8H  . pneumococcal 23 valent vaccine  0.5 mL Intramuscular Tomorrow-1000  . DISCONTD: albuterol-ipratropium  6 puff Inhalation Q6H  . DISCONTD: furosemide  80 mg Intravenous BID    Infusions:    . amiodarone (NEXTERONE PREMIX) 360 mg/200 mL dextrose 0.5 mg/min (10/25/11 0636)  . heparin 1,950 Units/hr (10/25/11 0325)  . DISCONTD: propofol 15 mcg/kg/min (10/24/11 0956)    PRN Medications: sodium chloride, albuterol, ALPRAZolam, fentaNYL   Assessment:  1. A/C respiratory failure  2. A/c systolic HF EF  30-35%  3. O2 dependent COPD  4. Chronic renal failure  5. DM2  6. Morbid obesity  7. Chest pain with negative troponins 8. Anemia 9. A/C renal failure 10. AF with RVR -> NSR on amio   Plan/Discussion:     Her respiratory failure is multifactorial due to severe COPD, obesity hypoventilation syndrome, anxiety and diastolic/systolic HF. (EF 40-45%)  Volume status is improved this am but she appears very anxious with increasing dyspnea. Will change IV lasix to po demadex to try to keep PCWP < 20. I agree with Dr. Delton Coombes that her respiratory status is clearly reaching end-stage and very reasonable to consider a no re-intubation scenario  We also discussed possibility of ischemic heart disease but she has tolerated significant hemodynamic stress without bumping troponin at all and thus I think possibility of high-grade CAD less likely and will defer cath for now as her renal function is tenuous and she is not CABG cnadidate. May need to reconsider at some point.  Back in SR on amio. Will switch to po. She is unlikely to tolerate AF well  in the long-term. Will continue with brief course of amiodarone (being careful with known lung disease) -  likely stop on d/c. Will also need anti-coagulation. Continue heparin. Favor NOAC (apixaban or rivoroxaban) over coumadin at d/c.   Consider increasing anxiolytic regimen. I will give 1mg  IV versed now to prevent further spiral and re-intubation as happened last week.   Would consider Palliative Care Consult for goals of Care.    Length of Stay: 6 Jaycen Vercher,MD 7:38 AM

## 2011-10-26 ENCOUNTER — Inpatient Hospital Stay (HOSPITAL_COMMUNITY): Payer: Medicare Other

## 2011-10-26 ENCOUNTER — Encounter (HOSPITAL_COMMUNITY)
Admission: EM | Disposition: A | Discharge status: Skilled Nursing Facility | Payer: Self-pay | Source: Home / Self Care | Attending: Pulmonary Disease

## 2011-10-26 DIAGNOSIS — R079 Chest pain, unspecified: Secondary | ICD-10-CM

## 2011-10-26 HISTORY — PX: LEFT HEART CATHETERIZATION WITH CORONARY ANGIOGRAM: SHX5451

## 2011-10-26 LAB — CBC
HCT: 30.9 % — ABNORMAL LOW (ref 36.0–46.0)
MCH: 28.3 pg (ref 26.0–34.0)
MCHC: 30.7 g/dL (ref 30.0–36.0)
MCV: 92 fL (ref 78.0–100.0)
Platelets: 206 10*3/uL (ref 150–400)
RDW: 14.4 % (ref 11.5–15.5)
WBC: 7.3 10*3/uL (ref 4.0–10.5)

## 2011-10-26 LAB — POCT ACTIVATED CLOTTING TIME: Activated Clotting Time: 145 seconds

## 2011-10-26 LAB — BASIC METABOLIC PANEL
BUN: 57 mg/dL — ABNORMAL HIGH (ref 6–23)
Calcium: 9.1 mg/dL (ref 8.4–10.5)
Chloride: 100 mEq/L (ref 96–112)
Creatinine, Ser: 1.84 mg/dL — ABNORMAL HIGH (ref 0.50–1.10)
GFR calc Af Amer: 32 mL/min — ABNORMAL LOW (ref >90)

## 2011-10-26 LAB — GLUCOSE, CAPILLARY
Glucose-Capillary: 149 mg/dL — ABNORMAL HIGH (ref 70–99)
Glucose-Capillary: 177 mg/dL — ABNORMAL HIGH (ref 70–99)

## 2011-10-26 LAB — PROTIME-INR
INR: 1.19 (ref 0.00–1.49)
Prothrombin Time: 14.9 seconds (ref 11.6–15.2)

## 2011-10-26 SURGERY — LEFT HEART CATHETERIZATION WITH CORONARY ANGIOGRAM
Anesthesia: LOCAL

## 2011-10-26 MED ORDER — ACETAMINOPHEN 325 MG PO TABS: 650.0000 mg | ORAL_TABLET | ORAL | Status: DC | PRN

## 2011-10-26 MED ORDER — HEPARIN (PORCINE) IN NACL 100-0.45 UNIT/ML-% IJ SOLN
1800.0000 [IU]/h | INTRAMUSCULAR | Status: DC
  Administered 2011-10-26 – 2011-10-28 (×4): 1800 [IU]/h via INTRAVENOUS
  Filled 2011-10-26 (×8): qty 250

## 2011-10-26 MED ORDER — NITROGLYCERIN 0.2 MG/ML ON CALL CATH LAB
INTRAVENOUS | Status: AC
  Filled 2011-10-26: qty 1

## 2011-10-26 MED ORDER — SODIUM CHLORIDE 0.9 % IV SOLN: INTRAVENOUS | Status: AC

## 2011-10-26 MED ORDER — LIDOCAINE HCL (PF) 1 % IJ SOLN
INTRAMUSCULAR | Status: AC
  Filled 2011-10-26: qty 30

## 2011-10-26 MED ORDER — METHYLPREDNISOLONE SODIUM SUCC 125 MG IJ SOLR
80.0000 mg | Freq: Four times a day (QID) | INTRAMUSCULAR | Status: DC
  Administered 2011-10-26 – 2011-10-29 (×11): 80 mg via INTRAVENOUS
  Filled 2011-10-26 (×15): qty 1.28

## 2011-10-26 MED ORDER — HEPARIN (PORCINE) IN NACL 2-0.9 UNIT/ML-% IJ SOLN
INTRAMUSCULAR | Status: AC
  Filled 2011-10-26: qty 1500

## 2011-10-26 MED ORDER — ASPIRIN 300 MG RE SUPP
300.0000 mg | Freq: Once | RECTAL | Status: AC
  Administered 2011-10-26: 300 mg via RECTAL
  Filled 2011-10-26: qty 1

## 2011-10-26 MED ORDER — MIDAZOLAM HCL 2 MG/2ML IJ SOLN
INTRAMUSCULAR | Status: AC
  Filled 2011-10-26: qty 2

## 2011-10-26 MED ORDER — FENTANYL CITRATE 0.05 MG/ML IJ SOLN
INTRAMUSCULAR | Status: AC
  Filled 2011-10-26: qty 2

## 2011-10-26 MED ORDER — POTASSIUM CHLORIDE 10 MEQ/100ML IV SOLN
10.0000 meq | INTRAVENOUS | Status: AC
  Administered 2011-10-26: 10 meq via INTRAVENOUS
  Administered 2011-10-26: 19:00:00 via INTRAVENOUS
  Administered 2011-10-26 – 2011-10-27 (×2): 10 meq via INTRAVENOUS
  Filled 2011-10-26: qty 300

## 2011-10-26 MED ORDER — POTASSIUM CHLORIDE 10 MEQ/100ML IV SOLN
INTRAVENOUS | Status: AC
  Filled 2011-10-26: qty 100

## 2011-10-26 MED ORDER — ONDANSETRON HCL 4 MG/2ML IJ SOLN: 4.0000 mg | Freq: Four times a day (QID) | INTRAMUSCULAR | Status: DC | PRN

## 2011-10-26 MED ORDER — FERROUS SULFATE 300 (60 FE) MG/5ML PO SYRP
300.0000 mg | ORAL_SOLUTION | Freq: Every day | ORAL | Status: DC
  Administered 2011-10-27 – 2011-10-30 (×4): 300 mg
  Filled 2011-10-26 (×5): qty 5

## 2011-10-26 NOTE — Progress Notes (Signed)
Name: VELA SERAFINO MRN: [id number] DOB: 27-Aug-1945    LOS: 7  Referring Provider:  EDP -Patria Mane Reason for Referral:  Acute Respiratory Failure   PULMONARY / CRITICAL CARE MEDICINE 66 yo female with h/o COPD (4L at home), DM2, systolic CHF (EF: 40-45%), morbid obesity, Chronic Renal Insufficiency (Cr baseline:1.5) who presented in acute respiratory failure and was intubated.   Events Since Admission: 10/8 - Admit with chest pain, respiratory failure, AMS. Intubated 10/8 10/09: episode of a-fib with chest discomfort. Started on amiodarone on 10/10 10/10: extubated  10/11: reintubated 10/13: extubated 10:14: reintubated  Subjective:  10/14 Re-intubated due to respiratory insufficiency 10/15 cath  Vital Signs: Temp:  [97.3 F (36.3 C)-98.6 F (37 C)] 97.9 F (36.6 C) (10/15 0200) Pulse Rate:  [55-119] 77  (10/15 0411) Resp:  [15-29] 22  (10/15 0411) BP: (96-192)/(46-171) 151/88 mmHg (10/15 0411) SpO2:  [80 %-100 %] 98 % (10/15 0200) FiO2 (%):  [40 %-100 %] 40 % (10/15 0411) Weight:  [264 lb 8.8 oz (120 kg)] 264 lb 8.8 oz (120 kg) (10/15 0500)  Intake/Output Summary (Last 24 hours) at 10/26/11 0614 Last data filed at 10/26/11 0228  Gross per 24 hour  Intake 1314.18 ml  Output    916 ml  Net 398.18 ml   Physical Examination: General:  Morbidly obese on vent Neuro:  Alert and oriented x4 HEENT:  Mm pink/moist Cardiovascular:  S1s2, regular rhythm Lungs:  Coarse breath sounds Abdomen:  Obese/soft, non tender Musculoskeletal:  No acute deformities Skin:  Intact, rash under pannus   Active Problems:  Pulmonary edema  Hypoxemia  COPD (chronic obstructive pulmonary disease)  Atrial fibrillation  Acute on chronic diastolic heart failure  Hypokalemia   ASSESSMENT AND PLAN  PULMONARY  Lab 10/23/11 0549 10/22/11 0932 10/22/11 0513 10/21/11 0500 10/20/11 1636 10/20/11 0500 10/19/11 1736 10/19/11 1454  PHART -- 7.313* -- -- -- 7.377 7.281* 7.121*  PCO2ART -- 63.4* --  -- -- 56.4* 71.9* 120.1*  PO2ART -- 82.9 -- -- -- 85.5 86.0 163.0*  HCO3 -- 31.2* -- -- -- 32.1* 34.0* 38.8*  O2SAT 59.2 94.3 53.0 68.7 68.4 -- -- --   Ventilator Settings: Vent Mode:  [-] PRVC FiO2 (%):  [40 %-100 %] 40 % Set Rate:  [14 bmp-20 bmp] 20 bmp Vt Set:  [500 mL] 500 mL PEEP:  [5 cmH20-6 cmH20] 5 cmH20 Pressure Support:  [10 cmH20] 10 cmH20 Plateau Pressure:  [14 cmH20-36 cmH20] 14 cmH20 CXR:  10/8 >>>pulmonary edema 10/9 >>>pulm edema, slight improvement from 10/8 10/10: slight improvement of interstitial pulmonary edema 10/15>>> mild worsening pulmonary edema ETT:  10/8>>>10/10 Reintubation: 10/11>>>10/13 Extubation: 10/13 Re-intubation 10/14 A:   Acute Hypercarbic Respiratory Failure, now recurrent failure 10/11 despite diuresis and adequate CVP. ? Due to OSA/OHS + anxiety, ? Contribution of bronchitis/HCAP + COPD (curently wheezing 10/12) COPD - on spiriva, symbicort, BD's at home.  rx with steroids 10/12 for bronchospasm  CHF systolic and diastolic (EF:40-45%) exacerbation likely contributed to initial respiratory failure, potential for CAD exacerbating respiratory failure.  P:   - s/p re-intubation, on PRVC. Continue vent until cardiac cath is done.  -BDers -pcxr in am  - discuss long term management of respiratory distress and future re-intubations -Likely with such multifactorial dz, dnr would be appropariate Wean cpap 5 ps 5 , goal 2 hrs -consider escalation attempts steroids, further neg balance , s/p contrast , limited diuretic for now  CARDIOVASCULAR  Lab 10/21/11 0455 10/20/11 1606 10/20/11 0503 10/19/11 2100 10/19/11  1454 10/19/11 1453  TROPONINI -- <0.30 <0.30 <0.30 -- <0.30  LATICACIDVEN -- -- -- -- 1.5 --  PROBNP 13824.0* -- -- -- -- 9736.0*   ECG:  SR on monitor.  EKG with PVC's, no acute ST changes 10/9: PVC's, sinus tach, no ST changes 10/10: new onset atrial fibrillation  Lines:  10/8 L IJ TLC>>> 10/11 Right IJ Theone Murdoch >>>  Echo:  10/09: Study Conclusions  - Left ventricle: The cavity size was moderately dilated. Wall thickness was normal. Systolic function was mildly to moderately reduced. The estimated ejection fraction was in the range of 40% to 45%. Due to first degree atrioventricular block, there was fusion of early and atrial contributions to ventricular filling. - Left atrium: The atrium was mildly dilated. - Pulmonary arteries: PA peak pressure: 31mm Hg (S).  A:  A-fib- on amiodarone po (patient refused last dose) and heparin gtt Chest Pain - acute chest pain prior to arrival. Chest pain overnight. Negative trop.   Hx of HTN - on norvasc, lasix at home Systolic/diastolic CHF: EF:40-45%. Respiratory decompensation could be associated with increased RLV stiffness. eval for CAD P:  PA-c placed 10/11 to guide volume status, PCWP elevated at time of respiratory distress at 50. Last PCWP (10/14) was 25. Consider D/c catheter, just unclear if utility now - plan for cath today to evaluate for CAD - Lasix transitioned to torsemide 20mg  bid per cards, hold s/p contrast? Continue amiodarone. Heparin 10/10, probably xarelto once stabilized Continue ASA  RENAL  Lab 10/26/11 0435 10/25/11 0430 10/24/11 1900 10/24/11 0500 10/23/11 0355 10/22/11 1841 10/20/11 0503  NA 144 143 144 146* 143 -- --  K 3.7 3.5 -- -- -- -- --  CL 100 98 98 103 98 -- --  CO2 34* 35* 33* 31 33* -- --  BUN 57* 52* 50* 48* 49* -- --  CREATININE 1.84* 1.84* 1.71* 1.62* 1.76* -- --  CALCIUM 9.1 8.9 9.2 8.9 8.7 -- --  MG -- 1.9 -- 1.9 -- 1.8 1.6  PHOS -- 4.0 -- 4.2 -- -- 4.2   Intake/Output      10/14 0701 - 10/15 0700   P.O. 360   I.V. (mL/kg) 898 (7.5)   Total Intake(mL/kg) 1258 (10.5)   Urine (mL/kg/hr) 915 (0.3)   Stool 1   Total Output 916   Net +342        Foley:  10/8 >>>  A:   Acute Renal Insufficiency on chronic renal insufficiency: baseline Cr: 1.5 -on chronic lasix at home  P:   - continue to monitor BMP in the  setting of torsemide.  - BMP in am -avoid diuretic post cath contrast  GASTROINTESTINAL  Lab 10/22/11 0300 10/21/11 0455 10/20/11 0503 10/19/11 1453  AST 26 30 50* 77*  ALT 40* 45* 60* 70*  ALKPHOS 64 54 50 66  BILITOT 0.3 0.4 0.3 0.5  PROT 6.9 6.6 6.3 7.5  ALBUMIN 2.7* 2.7* 2.6* 3.3*    A:   Morbid Obesity Mild Elevation of LFT's - normalizing  P:   -npo prior to cath Restart post  HEMATOLOGIC  Lab 10/26/11 0500 10/26/11 0435 10/25/11 0430 10/24/11 0500 10/23/11 0355 10/22/11 0300  HGB -- 9.5* 9.1* 9.1* 9.0* 9.4*  HCT -- 30.9* 29.3* 26.8* 28.2* 28.6*  PLT -- 206 237 233 200 240  INR 1.19 -- -- -- -- --  APTT -- -- -- -- -- --   A:   No acute issues.  Hx of Anemia (Hg:10.5) stable  P:  -monitor cbc in the setting of heparin -heparin drip  INFECTIOUS  Lab 10/26/11 0435 10/25/11 0430 10/24/11 0500 10/23/11 0355 10/22/11 0300 10/21/11 0455 10/20/11 0503 10/19/11 1453  WBC 7.3 7.5 4.1 5.9 9.3 -- -- --  PROCALCITON -- -- -- -- -- 4.28 4.24 <0.10   Cultures: 10/8 BCx2>>>no growth to date 10/8 Sputum>>> not collected 10/8 UA>>> negative 10/8 urine strep>>> not collected 10/8 urine legionella >>> not collected Antibiotics: Zosyn (?HCAP) 10/8>>> 10/15 Vanco (?HCAP) 10/8>>> 10/10 Diflucan (yeast under pannus) 10/8>>>10/10  A:   Bilateral airspace disease -likely edema but difficult to interperet due to body habitus Empiric vanc and zosyn for HCAP on 10/10. Vanc d/ced 10/10. Zosyn d/ced 10/15 P:   No evidence infection: patient afebrile with normal WBC  ENDOCRINE  Lab 10/26/11 0359 10/25/11 2331 10/25/11 1947 10/25/11 1615 10/25/11 1240  GLUCAP 174* 149* 187* 248* 246*   A:   DM - on 70/30 at home 30 units BID    A1C: 7.3 P:   -ICU SSI Low temp prior, TSH: pending  NEUROLOGIC 10/8 head CT>>> no acute process A:   AMS - likely related to hypercarbia. Now back to baseline Hx anxiety / depression - on xanax, celexa at home P:   Continue fentanyl and  alprazolam prn Anxiety, TSH: pending On home citalopram  BEST PRACTICE / DISPOSITION Level of Care:  ICU Primary Service:  PCCM Consultants:  cardiology Code Status:  FULL Diet: NPO DVT Px:  heparin gtt GI Px:  Protonix Skin Integrity:  Rash as above Social / Family: son updated at bedside 10/25/11  Ccm time 30 min   Mcarthur Rossetti. Tyson Alias, MD, FACP Pgr: [phone] [facility]

## 2011-10-26 NOTE — Procedures (Signed)
Tolerated well Dorina Ribaudo J. Marthann Abshier, MD, FACP Pgr: [phone] [facility]  

## 2011-10-26 NOTE — Progress Notes (Signed)
ANTICOAGULATION CONSULT NOTE - Follow Up Consult  Pharmacy Consult for heparin Indication: atrial fibrillation  Labs:  Basename 10/26/11 0435 10/25/11 0430 10/24/11 1900 10/24/11 0500  HGB 9.5* 9.1* -- --  HCT 30.9* 29.3* -- 26.8*  PLT 206 237 -- 233  APTT -- -- -- --  LABPROT -- -- -- --  INR -- -- -- --  HEPARINUNFRC 0.84* 0.58 -- 0.48  CREATININE -- 1.84* 1.71* 1.62*  CKTOTAL -- -- -- --  CKMB -- -- -- --  TROPONINI -- -- -- --    Assessment: 66yo female now supratherapeutic on heparin after several levels at goal; new heparin bag hung this am which can cause some variability.  Going for PCI this am.  Goal of Therapy:  Heparin level 0.3-0.7 units/ml   Plan:  Will decrease heparin gtt by 1-2 units/kg/hr to 1800 units/hr until cath and f/u after.  Colleen Can PharmD BCPS 10/26/2011,5:14 AM

## 2011-10-26 NOTE — H&P (View-Only) (Signed)
Events from today noted. Reintubated for respiratory distress. PCWP at the time was ~50.   Now awake on vent. I measured PCWP myself and now about 25.   Long discussion with her and her son Jeannett Senior about the fact that coronary ischemia may be playing a role as she had + stress test recently and her respiratory decompensations are associated with increasing LV stiffness and marked PCWP elevations.   Given 2nd re-intubation I think we need to definitively assess whether or not she has critical CAD. She is not CABG candidate but may be able to help with PCI if she has high grade proximal lesion.   Did discuss risks of cath including worsening renal dysfunction and HD. They are ok with proceeding. Will schedule for 9a tomorrow.   D/w Dr. Tyson Alias.  Truman Hayward 5:49 PM

## 2011-10-26 NOTE — Progress Notes (Signed)
DR. Tyson Alias made aware that patient continues to refuse OGT and patient not receiving oral medications.  Converted from Afib to first degree HB.

## 2011-10-26 NOTE — CV Procedure (Addendum)
Cardiac Cath Procedure Note:  Indication: Chest pain. Recurrent respiratory failure. + stress test.  Procedures performed:  1) Selective coronary angiography 2) Left heart catheterization 3) Mynx Left femoral artery closure  Description of procedure:   The risks and indication of the procedure were explained. Consent was signed and placed on the chart. An appropriate timeout was taken prior to the procedure. The right groin was prepped and draped in the routine sterile fashion and anesthetized with 1% local lidocaine.   A 5 FR arterial sheath was placed in the right femoral artery using a modified Seldinger technique. Standard catheters including a JL4 and JR4 were used. All catheter exchanges were made over a wire. After the procedure the R groin arteriotomy was successfully closed with a Mynx closure device.  Total contrast used: 25-30 cc  Complications:  None apparent  Findings:  Ao Pressure: 148/81 (110) LV Pressure: 164/25 There was no signficant gradient across the aortic valve on pullback.  Left main: Normal  LAD: Large vessel wrapped the apex. Diffuse 20-30% stenosis in mid to distal vessel  LCX: Normal  RCA: Large dominant vessel. Normal    Assessment: 1. Minimal nonobstructive CAD 2. Elevated LVEDP  Plan/Discussion:  Continue medical therapy with close attention to volume status and afterload reduction.   [name] 11:01 AM

## 2011-10-26 NOTE — Progress Notes (Signed)
Patient refusing OG tube. Importance of tube for treatment explained. Unable to give patient Ferrous Sulfate and Demadex.

## 2011-10-26 NOTE — Interval H&P Note (Signed)
History and Physical Interval Note:  10/26/2011 10:32 AM  [name]  has presented today for surgery, with the diagnosis of Chest pain and heart failure.  The various methods of treatment have been discussed with the patient and family. After consideration of risks, benefits and other options for treatment, the patient has consented to  Procedure(s) (LRB) with comments: LEFT HEART CATHETERIZATION WITH CORONARY ANGIOGRAM (N/A) as a surgical intervention .  The patient's history has been reviewed, patient examined, no change in status, stable for surgery.  I have reviewed the patient's chart and labs.  Questions were answered to the patient's satisfaction.     Quindarrius [place]

## 2011-10-26 NOTE — Progress Notes (Signed)
ANTICOAGULATION CONSULT NOTE - Follow Up Consult  Pharmacy Consult for Heparin Indication: atrial fibrillation  Allergies  Allergen Reactions  . Pioglitazone     REACTION: edema  . Rosiglitazone Maleate Nausea And Vomiting    Patient Measurements: Height: 5\' 6"  (167.6 cm) Weight: 264 lb 8.8 oz (120 kg) IBW/kg (Calculated) : 59.3  Heparin Dosing Weight: 88 kg  Vital Signs: Temp: 97.7 F (36.5 C) (10/15 0930) BP: 147/86 mmHg (10/15 1230) Pulse Rate: 83  (10/15 1230)  Labs:  Basename 10/26/11 0500 10/26/11 0435 10/25/11 0430 10/24/11 1900 10/24/11 0500  HGB -- 9.5* 9.1* -- --  HCT -- 30.9* 29.3* -- 26.8*  PLT -- 206 237 -- 233  APTT -- -- -- -- --  LABPROT 14.9 -- -- -- --  INR 1.19 -- -- -- --  HEPARINUNFRC -- 0.84* 0.58 -- 0.48  CREATININE -- 1.84* 1.84* 1.71* --  CKTOTAL -- -- -- -- --  CKMB -- -- -- -- --  TROPONINI -- -- -- -- --    Estimated Creatinine Clearance: 39.7 ml/min (by C-G formula based on Cr of 1.84).   Medications:  Infusions:    . sodium chloride 75 mL/hr at 10/26/11 1254  . heparin Stopped (10/26/11 1000)  . nitroGLYCERIN Stopped (10/26/11 1610)    Assessment: Atrial Fibrillation:  To restart anticoagulation with Heparin post-cath.  Her heparin infusion rate was adjusted this morning due to a supratherapeutic level.  Goal of Therapy:  Heparin level 0.3-0.7 units/ml Monitor platelets by anticoagulation protocol: Yes   Plan:  Restart Heparin at 1800 units/hr Check Heparin level 8 hours after restarting Heparin Daily Heparin level and CBC  Estella Husk, Pharm.D., BCPS Clinical Pharmacist  Phone [phone] Pager [phone] 10/26/2011, 1:16 PM

## 2011-10-27 ENCOUNTER — Inpatient Hospital Stay (HOSPITAL_COMMUNITY): Payer: Medicare Other

## 2011-10-27 DIAGNOSIS — I059 Rheumatic mitral valve disease, unspecified: Secondary | ICD-10-CM

## 2011-10-27 LAB — CBC WITH DIFFERENTIAL/PLATELET
Basophils Absolute: 0 10*3/uL (ref 0.0–0.1)
Basophils Relative: 0 % (ref 0–1)
HCT: 31.4 % — ABNORMAL LOW (ref 36.0–46.0)
Hemoglobin: 9.5 g/dL — ABNORMAL LOW (ref 12.0–15.0)
Lymphocytes Relative: 8 % — ABNORMAL LOW (ref 12–46)
MCHC: 30.3 g/dL (ref 30.0–36.0)
Monocytes Absolute: 0.2 10*3/uL (ref 0.1–1.0)
Monocytes Relative: 3 % (ref 3–12)
Neutro Abs: 5.9 10*3/uL (ref 1.7–7.7)
Neutrophils Relative %: 89 % — ABNORMAL HIGH (ref 43–77)
RDW: 14.4 % (ref 11.5–15.5)
WBC: 6.6 10*3/uL (ref 4.0–10.5)

## 2011-10-27 LAB — BASIC METABOLIC PANEL
CO2: 34 mEq/L — ABNORMAL HIGH (ref 19–32)
Chloride: 105 mEq/L (ref 96–112)
Creatinine, Ser: 1.71 mg/dL — ABNORMAL HIGH (ref 0.50–1.10)
GFR calc Af Amer: 35 mL/min — ABNORMAL LOW (ref >90)
Potassium: 4.4 mEq/L (ref 3.5–5.1)

## 2011-10-27 LAB — HEPARIN LEVEL (UNFRACTIONATED): Heparin Unfractionated: 0.51 IU/mL (ref 0.30–0.70)

## 2011-10-27 LAB — GLUCOSE, CAPILLARY
Glucose-Capillary: 108 mg/dL — ABNORMAL HIGH (ref 70–99)
Glucose-Capillary: 196 mg/dL — ABNORMAL HIGH (ref 70–99)
Glucose-Capillary: 206 mg/dL — ABNORMAL HIGH (ref 70–99)

## 2011-10-27 MED ORDER — AMIODARONE LOAD VIA INFUSION
150.0000 mg | Freq: Once | INTRAVENOUS | Status: AC
  Administered 2011-10-27: 150 mg via INTRAVENOUS
  Filled 2011-10-27: qty 83.34

## 2011-10-27 MED ORDER — MIDAZOLAM BOLUS VIA INFUSION
1.0000 mg | Freq: Four times a day (QID) | INTRAVENOUS | Status: DC
  Filled 2011-10-27 (×4): qty 1

## 2011-10-27 MED ORDER — FUROSEMIDE 10 MG/ML IJ SOLN
80.0000 mg | Freq: Two times a day (BID) | INTRAMUSCULAR | Status: DC
  Administered 2011-10-27 – 2011-10-30 (×7): 80 mg via INTRAVENOUS
  Filled 2011-10-27 (×9): qty 8

## 2011-10-27 MED ORDER — HYDRALAZINE HCL 20 MG/ML IJ SOLN
10.0000 mg | Freq: Four times a day (QID) | INTRAMUSCULAR | Status: DC
  Administered 2011-10-27 – 2011-10-28 (×4): 10 mg via INTRAVENOUS
  Filled 2011-10-27 (×9): qty 0.5

## 2011-10-27 MED ORDER — ETOMIDATE 2 MG/ML IV SOLN
INTRAVENOUS | Status: AC
  Filled 2011-10-27: qty 10

## 2011-10-27 MED ORDER — AMIODARONE HCL IN DEXTROSE 360-4.14 MG/200ML-% IV SOLN
60.0000 mg/h | INTRAVENOUS | Status: AC
Start: 2011-10-27 — End: 2011-10-27
  Administered 2011-10-27: 60 mg/h via INTRAVENOUS
  Filled 2011-10-27 (×3): qty 200

## 2011-10-27 MED ORDER — ETOMIDATE 2 MG/ML IV SOLN
15.0000 mg | Freq: Once | INTRAVENOUS | Status: AC
  Administered 2011-10-27: 7.5 mg via INTRAVENOUS

## 2011-10-27 MED ORDER — PERFLUTREN LIPID MICROSPHERE
1.0000 mL | INTRAVENOUS | Status: AC | PRN
  Administered 2011-10-27: 7 mL via INTRAVENOUS
  Filled 2011-10-27: qty 10

## 2011-10-27 MED ORDER — AMIODARONE HCL IN DEXTROSE 360-4.14 MG/200ML-% IV SOLN
30.0000 mg/h | INTRAVENOUS | Status: DC
  Administered 2011-10-27 – 2011-10-30 (×5): 30 mg/h via INTRAVENOUS
  Filled 2011-10-27 (×13): qty 200

## 2011-10-27 MED ORDER — MIDAZOLAM HCL 2 MG/2ML IJ SOLN
1.0000 mg | Freq: Four times a day (QID) | INTRAMUSCULAR | Status: DC
  Administered 2011-10-27 – 2011-10-28 (×4): 1 mg via INTRAVENOUS
  Filled 2011-10-27 (×5): qty 2

## 2011-10-27 MED ORDER — SODIUM CHLORIDE 0.9 % IV SOLN
INTRAVENOUS | Status: DC
  Administered 2011-10-28: 21:00:00 via INTRAVENOUS

## 2011-10-27 MED ORDER — FENTANYL CITRATE 0.05 MG/ML IJ SOLN
100.0000 ug | Freq: Once | INTRAMUSCULAR | Status: AC
  Administered 2011-10-27: 100 ug via INTRAVENOUS

## 2011-10-27 MED ORDER — MIDAZOLAM HCL 2 MG/2ML IJ SOLN
2.0000 mg | Freq: Once | INTRAMUSCULAR | Status: AC
  Administered 2011-10-27: 2 mg via INTRAVENOUS

## 2011-10-27 NOTE — Progress Notes (Signed)
ANTICOAGULATION CONSULT NOTE - Follow Up Consult  Pharmacy Consult for Heparin Indication: atrial fibrillation  Allergies  Allergen Reactions  . Pioglitazone     REACTION: edema  . Rosiglitazone Maleate Nausea And Vomiting    Patient Measurements: Height: 5\' 6"  (167.6 cm) Weight: 265 lb 10.5 oz (120.5 kg) IBW/kg (Calculated) : 59.3  Heparin Dosing Weight: 88 kg  Vital Signs: BP: 137/55 mmHg (10/16 0300) Pulse Rate: 79  (10/16 0300)  Labs:  Basename 10/27/11 0300 10/26/11 0500 10/26/11 0435 10/25/11 0430  HGB 9.5* -- 9.5* --  HCT 31.4* -- 30.9* 29.3*  PLT 211 -- 206 237  APTT -- -- -- --  LABPROT -- 14.9 -- --  INR -- 1.19 -- --  HEPARINUNFRC 0.51 -- 0.84* 0.58  CREATININE 1.71* -- 1.84* 1.84*  CKTOTAL -- -- -- --  CKMB -- -- -- --  TROPONINI -- -- -- --    Estimated Creatinine Clearance: 42.8 ml/min (by C-G formula based on Cr of 1.71).   Medications:  Infusions:     . sodium chloride 50 mL/hr (10/26/11 1254)  . heparin 1,800 Units/hr (10/27/11 0101)  . nitroGLYCERIN Stopped (10/26/11 0958)  . DISCONTD: heparin Stopped (10/26/11 1000)    Assessment: Atrial Fibrillation:  Heparin restarted post-cath.  Heparin level (0.51) is at-goal on 1800 units/hr.   Goal of Therapy:  Heparin level 0.3-0.7 units/ml Monitor platelets by anticoagulation protocol: Yes   Plan:  1. Continue IV heparin at 1800 units/hr.  2. Daily CBC, heparin level.  Lorre Munroe, PharmD 10/27/2011, 5:15 AM

## 2011-10-27 NOTE — Progress Notes (Signed)
  Echocardiogram 2D Echocardiogram with Definity has been performed.  Alioune Hodgkin 10/27/2011, 1:11 PM

## 2011-10-27 NOTE — Progress Notes (Signed)
Name: [name] MRN: [id number] DOB: 07/16/1945    LOS: 8  Referring Provider:  EDP -Patria Mane Reason for Referral:  Acute Respiratory Failure   PULMONARY / CRITICAL CARE MEDICINE 66 yo female with h/o COPD (4L at home), DM2, systolic CHF (EF: 40-45%), morbid obesity, Chronic Renal Insufficiency (Cr baseline:1.5) who presented in acute respiratory failure and was intubated.   Events Since Admission: 10/8 - Admit with chest pain, respiratory failure, AMS. Intubated 10/8 10/09: episode of a-fib with chest discomfort. Started on amiodarone on 10/10 10/10: extubated  10/11: reintubated 10/13: extubated 10:14: reintubated 10:15: cardiac cath:  Subjective:  10/14 Re-intubated due to respiratory insufficiency 10/15 cath  Vital Signs: Temp:  [98.4 F (36.9 C)] 98.4 F (36.9 C) (10/16 0746) Pulse Rate:  [56-126] 109  (10/16 1000) Resp:  [13-28] 16  (10/16 1000) BP: (92-184)/(55-137) 184/137 mmHg (10/16 1000) SpO2:  [95 %-100 %] 96 % (10/16 1000) FiO2 (%):  [40 %] 40 % (10/16 0800) Weight:  [265 lb 10.5 oz (120.5 kg)] 265 lb 10.5 oz (120.5 kg) (10/16 0458)  Intake/Output Summary (Last 24 hours) at 10/27/11 1026 Last data filed at 10/27/11 0700  Gross per 24 hour  Intake 1375.7 ml  Output    695 ml  Net  680.7 ml   Physical Examination: General:  Morbidly obese on vent Neuro:  Alert and oriented x4 HEENT:  Mm pink/moist Cardiovascular:  S1s2, regular rhythm Lungs:  Coarse breath sounds Abdomen:  Obese/soft, non tender Musculoskeletal:  No acute deformities Skin:  Intact, rash under pannus   Active Problems:  Pulmonary edema  Hypoxemia  COPD (chronic obstructive pulmonary disease)  Atrial fibrillation  Acute on chronic diastolic heart failure  Hypokalemia   ASSESSMENT AND PLAN  PULMONARY  Lab 10/23/11 0549 10/22/11 0932 10/22/11 0513 10/21/11 0500 10/20/11 1636  PHART -- 7.313* -- -- --  PCO2ART -- 63.4* -- -- --  PO2ART -- 82.9 -- -- --  HCO3 -- 31.2* -- --  --  O2SAT 59.2 94.3 53.0 68.7 68.4   Ventilator Settings: Vent Mode:  [-] PRVC FiO2 (%):  [40 %] 40 % Set Rate:  [20 bmp] 20 bmp Vt Set:  [500 mL] 500 mL PEEP:  [5 cmH20] 5 cmH20 Plateau Pressure:  [19 cmH20-33 cmH20] 19 cmH20 CXR:  10/8 >>>pulmonary edema 10/9 >>>pulm edema, slight improvement from 10/8 10/10: slight improvement of interstitial pulmonary edema 10/15>>> mild worsening pulmonary edema 10/16: Vascular congestion improved. Persistent opacity at the lung bases.  ETT:  10/8>>>10/10 Reintubation: 10/11>>>10/13 Extubation: 10/13 Re-intubation 10/14 A:   Acute Hypercarbic Respiratory Failure, now recurrent failure 10/11 despite diuresis and adequate CVP. ? Due to OSA/OHS + anxiety, ? Contribution of bronchitis/HCAP + COPD (curently wheezing 10/12) COPD - on spiriva, symbicort, BD's at home.  rx with steroids 10/12 for bronchospasm  CHF systolic and diastolic (EF:40-45%) exacerbation likely contributed to initial respiratory failure, potential for CAD exacerbating respiratory failure.  P:   Update resp failure on wean , severe bronchospasm, sedated, stat pcxr on going, neb -BDers and stat now -pcxr in am  - discuss long term management of respiratory distress and future re-intubations, would favor NCb and extuibation in 48 hrs , and hope for best Wean cpap 5 ps 5 , goal 2 hrs, failed in a horrible fashion Maximize negative balance and afterload reduction -role bronch? CT chest angio? (just had contrast), etiology continued resp failure ett x 3 -if remains without progress will consider CT  And bronch Continue steroids  CARDIOVASCULAR  Lab 10/21/11 0455 10/20/11 1606  TROPONINI -- <0.30  LATICACIDVEN -- --  PROBNP 13824.0* --   ECG:  SR on monitor.  EKG with PVC's, no acute ST changes 10/9: PVC's, sinus tach, no ST changes 10/10: new onset atrial fibrillation  Lines:  10/8 L IJ TLC>>> 10/11 Right IJ Theone Murdoch >>>  Echo: 10/09: Study Conclusions  - Left  ventricle: The cavity size was moderately dilated. Wall thickness was normal. Systolic function was mildly to moderately reduced. The estimated ejection fraction was in the range of 40% to 45%. Due to first degree atrioventricular block, there was fusion of early and atrial contributions to ventricular filling. - Left atrium: The atrium was mildly dilated. - Pulmonary arteries: PA peak pressure: 31mm Hg (S).  A:  A-fib- on amiodarone po. Patient refusing OT and amiodarone IV started  (patient refused last dose) and heparin gtt.amio bolus given. Start back on gtt.  Chest Pain - acute chest pain prior to arrival. Chest pain overnight. Negative trop.   Hx of HTN - on norvasc, lasix at home Systolic/diastolic CHF: EF:40-45%. Respiratory decompensation could be associated with increased RLV stiffness. - cath non obstructing CAD with increased LVEDP P:   PA-c placed 10/11 to guide volume status, PCWP elevated at time of respiratory distress at 50. Last PCWP (10/14) was 25. Consider D/c catheter today, just unclear if utility now - Start back lasix IV 80 bid per cards Continue amiodarone. Heparin 10/10, probably xarelto once stabilized Continue ASA Hydralazine added Avoid all beta blocker with bronchospasm  RENAL  Lab 10/27/11 0300 10/26/11 0435 10/25/11 0430 10/24/11 1900 10/24/11 0500 10/22/11 1841  NA 145 144 143 144 146* --  K 4.4 3.7 -- -- -- --  CL 105 100 98 98 103 --  CO2 34* 34* 35* 33* 31 --  BUN 60* 57* 52* 50* 48* --  CREATININE 1.71* 1.84* 1.84* 1.71* 1.62* --  CALCIUM 9.2 9.1 8.9 9.2 8.9 --  MG -- -- 1.9 -- 1.9 1.8  PHOS -- -- 4.0 -- 4.2 --   Intake/Output      10/15 0701 - 10/16 0700 10/16 0701 - 10/17 0700   P.O.     I.V. (mL/kg) 1195.7 (9.9) 117 (1)   IV Piggyback 300    Total Intake(mL/kg) 1495.7 (12.4) 117 (1)   Urine (mL/kg/hr) 970 (0.3)    Stool     Total Output 970    Net +525.7 +117         Foley:  10/8 >>>  A:   Acute Renal Insufficiency on chronic  renal insufficiency: baseline Cr: 1.5 -on chronic lasix at home  P:   - continue to monitor BMP in the setting of lasix IV 80 bid - BMP in am May need CT chest angio  GASTROINTESTINAL  Lab 10/22/11 0300 10/21/11 0455  AST 26 30  ALT 40* 45*  ALKPHOS 64 54  BILITOT 0.3 0.4  PROT 6.9 6.6  ALBUMIN 2.7* 2.7*   A:   Morbid Obesity Mild Elevation of LFT's - normalizing  P:   TF ppi  HEMATOLOGIC  Lab 10/27/11 0300 10/26/11 0500 10/26/11 0435 10/25/11 0430 10/24/11 0500 10/23/11 0355  HGB 9.5* -- 9.5* 9.1* 9.1* 9.0*  HCT 31.4* -- 30.9* 29.3* 26.8* 28.2*  PLT 211 -- 206 237 233 200  INR -- 1.19 -- -- -- --  APTT -- -- -- -- -- --   A:   No acute issues.  Hx of Anemia (Hg:10.5) stable  At risk 25% copd have PE acute P:  -monitor cbc in the setting of heparin -heparin drip -doppler legs May require CT chest, in presence of heparin drip  INFECTIOUS  Lab 10/27/11 0300 10/26/11 0435 10/25/11 0430 10/24/11 0500 10/23/11 0355 10/21/11 0455  WBC 6.6 7.3 7.5 4.1 5.9 --  PROCALCITON -- -- -- -- -- 4.28   Cultures: 10/8 BCx2>>>no growth to date 10/8 Sputum>>> not collected 10/8 UA>>> negative 10/8 urine strep>>> not collected 10/8 urine legionella >>> not collected Antibiotics: Zosyn (?HCAP) 10/8>>> 10/15 Vanco (?HCAP) 10/8>>> 10/10 Diflucan (yeast under pannus) 10/8>>>10/10  A:   Bilateral airspace disease -likely edema but difficult to interperet due to body habitus Empiric vanc and zosyn for HCAP on 10/10. Vanc d/ced 10/10. Zosyn d/ced 10/15 P:   No evidence infection: patient afebrile with normal WBC  ENDOCRINE  Lab 10/27/11 0409 10/26/11 2336 10/26/11 2018 10/26/11 1244 10/26/11 0804  GLUCAP 136* 108* 177* 177* 180*   A:   DM - on 70/30 at home 30 units BID    A1C: 7.3 P:   -ICU SSI Low temp prior, TSH: pending  NEUROLOGIC 10/8 head CT>>> no acute process A:   AMS - likely related to hypercarbia. Now back to baseline Hx anxiety / depression - on  xanax, celexa at home P:   Continue fentanyl and alprazolam prn Anxiety, TSH: pending On home citalopram Add around clock benzo  BEST PRACTICE / DISPOSITION Level of Care:  ICU Primary Service:  PCCM Consultants:  cardiology Code Status:  FULL Diet: NPO DVT Px:  heparin gtt GI Px:  Protonix Skin Integrity:  Rash as above Social / Family: son updated at bedside 10/25/11  Ccm time 30 min   Mcarthur Rossetti. Tyson Alias, MD, FACP Pgr: [phone] [facility]

## 2011-10-27 NOTE — Clinical Social Work Note (Signed)
Clinical Social Worker continuing to follow patient for support and to facilitate patient discharge needs.  CSW spoke with [facility] who wrote a note stating "Patient reintubated on 10-11 - ext on 10-13 and reintubated on 10-14. Talked with son Ronda Fairly and [place] at bedside. Patient awake and alert, writing notes. Talked with rehab options. All in agreement for [facility] - chose [facility], prior to discharge to SNF when medically ready. Would like SNF in [place], which is closer to sisters when medically ready."  CSW spoke with [facility] representative who states that patient is medically appropriate for their facility.    Clinical Social Worker will continue to remain available as needed for support and to facilitate patient discharge needs.  Macario Golds, [place] [phone]

## 2011-10-27 NOTE — Progress Notes (Addendum)
Advanced Heart Failure Rounding Note   Subjective:    [name] is a 66 y/o woman with multiple medical problems including morbid obesity, COPD on 4L home O2, systolic CHF with EF 40-45% DM2, HTN, CRI (baseline ~1.50) and HL. She has had multiple admissions for respiratory failure.   Came to ER 10/8 for two days of CP and progressive dyspnea. Found to be in respiratory failure. ABG 7.1/120/163/98% on facemask. Intubated in ER for PNA +/- HF (Baseline weight 273)  Developed AF -> amio. Re-intubated 10/11 for recurrent resp failure.  Extubated 10/13.  Re-intubated 10/14.  Cath 10/26/11: Minimal nonobstructive CAD   CVP 14 Mean PA pressure 31 Unable to obtain stable PCWP  Wt stable.    Patient has refused OG tube so has no access for oral meds.  She is in atrial fibrillation in the 120s this morning and is in some distress after being suctioned.    Objective:   Weight Range:  Vital Signs:   Temp:  [97.7 F (36.5 C)-97.9 F (36.6 C)] 97.7 F (36.5 C) (10/15 0930) Pulse Rate:  [56-110] 56  (10/16 0700) Resp:  [13-28] 15  (10/16 0700) BP: (92-159)/(55-102) 132/57 mmHg (10/16 0700) SpO2:  [95 %-100 %] 96 % (10/16 0700) FiO2 (%):  [40 %] 40 % (10/16 0450) Weight:  [120.5 kg (265 lb 10.5 oz)] 120.5 kg (265 lb 10.5 oz) (10/16 0458) Last BM Date: 10/24/11  Weight change: Filed Weights   10/25/11 2121 10/26/11 0500 10/27/11 0458  Weight: 120 kg (264 lb 8.8 oz) 120 kg (264 lb 8.8 oz) 120.5 kg (265 lb 10.5 oz)    Intake/Output:   Intake/Output Summary (Last 24 hours) at 10/27/11 0739 Last data filed at 10/27/11 0700  Gross per 24 hour  Intake 1495.7 ml  Output    970 ml  Net  525.7 ml     Physical Exam: CVP 12-13 General: Morbidly obese. Anxious. Dyspneic. White knuckling the bed Neuro: Awake, alert. Follows commands HEENT: Normal  Cardiovascular: PMI nonpalpable. Distant HS s1s2 Regular Lungs:Mild rhonchi Abdomen: Obese/soft,  Nontender. Good BS  Musculoskeletal: No  acute deformities  Skin: Intact, rash under pannus, long unkempt toenails  Extremities: tr edema RLE/LLE , RUE art line in place . PAS in place     Telemetry: Sinus rhythm  Labs: Basic Metabolic Panel:  Lab 10/27/11 2130 10/26/11 0435 10/25/11 0430 10/24/11 1900 10/24/11 0500 10/22/11 1841  NA 145 144 143 144 146* --  K 4.4 3.7 3.5 3.5 3.9 --  CL 105 100 98 98 103 --  CO2 34* 34* 35* 33* 31 --  GLUCOSE 153* 189* 204* 236* 169* --  BUN 60* 57* 52* 50* 48* --  CREATININE 1.71* 1.84* 1.84* 1.71* 1.62* --  CALCIUM 9.2 9.1 8.9 -- -- --  MG -- -- 1.9 -- 1.9 1.8  PHOS -- -- 4.0 -- 4.2 --    Liver Function Tests:  Lab 10/22/11 0300 10/21/11 0455  AST 26 30  ALT 40* 45*  ALKPHOS 64 54  BILITOT 0.3 0.4  PROT 6.9 6.6  ALBUMIN 2.7* 2.7*   No results found for this basename: LIPASE:5,AMYLASE:5 in the last 168 hours No results found for this basename: AMMONIA:3 in the last 168 hours  CBC:  Lab 10/27/11 0300 10/26/11 0435 10/25/11 0430 10/24/11 0500 10/23/11 0355 10/21/11 0455  WBC 6.6 7.3 7.5 4.1 5.9 --  NEUTROABS 5.9 -- -- -- -- 8.9*  HGB 9.5* 9.5* 9.1* 9.1* 9.0* --  HCT 31.4* 30.9*  29.3* 26.8* 28.2* --  MCV 90.5 92.0 92.7 94.4 94.3 --  PLT 211 206 237 233 200 --    Cardiac Enzymes:  Lab 10/20/11 1606  CKTOTAL --  CKMB --  CKMBINDEX --  TROPONINI <0.30    BNP: BNP (last 3 results)  Basename 10/21/11 0455 10/19/11 1453 06/26/11 0526  PROBNP 13824.0* 9736.0* 7258.0*     Other results:     Imaging: Dg Chest Port 1 View  10/27/2011  *RADIOLOGY REPORT*  Clinical Data: Pulmonary edema  PORTABLE CHEST - 1 VIEW  Comparison: Yesterday  Findings: Lung bases not included limiting the exam.  Endotracheal tube, left internal jugular central venous catheter, right internal jugular Swan-Ganz stable. Vascular congestion improved.  Persistent bibasilar opacity.  No pneumothorax.  IMPRESSION: Vascular congestion improved.  Persistent opacity at the lung bases.   Original  Report Authenticated By: Donavan Burnet, M.D.    Dg Chest Port 1 View  10/26/2011  *RADIOLOGY REPORT*  Clinical Data: Evaluate endotracheal tube  PORTABLE CHEST - 1 VIEW  Comparison: 10/25/2011; 10/24/2011  Findings: Grossly unchanged enlarged cardiac silhouette and mediastinal contour are stable position of support apparatus including right jugular approach a PA catheter tip overlying the right intralobar pulmonary artery.  The pulmonary vasculature is slightly less distinct on the present examination.  Slight worsening of bibasilar heterogeneous opacities, right greater than left.  No definite pleural effusion or pneumothorax.  Unchanged bones.  IMPRESSION: 1.  Stable positioning of support apparatus.  No pneumothorax. 2.  Mild worsening of pulmonary edema. 3.  Increased bibasilar opacities, right greater than left, likely atelectasis.   Original Report Authenticated By: Waynard Reeds, M.D.    Dg Chest Port 1 View  10/25/2011  *RADIOLOGY REPORT*  Clinical Data: Endotracheal tube placement.  PORTABLE CHEST - 1 VIEW  Comparison: 10/25/2011  Findings: Endotracheal tube has been placed, tip 2.9 cm above the level of the carina.  Swan-Ganz catheter is in place, tip overlying the level of the right lower lobe pulmonary artery.  A left IJ central line tip overlies the level of the superior vena cava.  The heart is enlarged.  There are perihilar changes of mild pulmonary edema.  No focal consolidations or pleural effusions are identified.  IMPRESSION:  1.  Interval placement endotracheal tube. 2.  Cardiomegaly and mild edema.   Original Report Authenticated By: Patterson Hammersmith, M.D.      Medications:     Scheduled Medications:    . albuterol-ipratropium  4 puff Inhalation Q6H  . amiodarone  200 mg Oral BID  . antiseptic oral rinse  15 mL Mouth Rinse QID  . chlorhexidine  15 mL Mouth Rinse BID  . Chlorhexidine Gluconate Cloth  6 each Topical Q0600  . citalopram  20 mg Oral Daily  . fentaNYL       . ferrous sulfate  300 mg Per Tube Q breakfast  . heparin      . insulin aspart  2-6 Units Subcutaneous Q4H  . lidocaine      . methylPREDNISolone (SOLU-MEDROL) injection  80 mg Intravenous Q6H  . midazolam      . midazolam      . midazolam  2 mg Intravenous Once  . mupirocin ointment   Nasal BID  . nitroGLYCERIN      . pantoprazole (PROTONIX) IV  40 mg Intravenous QHS  . potassium chloride  10 mEq Intravenous Q1 Hr x 4  . torsemide  20 mg Oral BID  . DISCONTD:  ferrous sulfate  325 mg Oral Q breakfast  . DISCONTD: methylPREDNISolone (SOLU-MEDROL) injection  60 mg Intravenous Q6H  . DISCONTD: potassium chloride  40 mEq Oral Daily  . DISCONTD: sodium chloride  3 mL Intravenous Q12H    Infusions:    . sodium chloride 50 mL/hr (10/26/11 1254)  . sodium chloride 20 mL/hr at 10/27/11 0545  . heparin 1,800 Units/hr (10/27/11 0101)  . nitroGLYCERIN Stopped (10/26/11 0958)  . DISCONTD: heparin Stopped (10/26/11 1000)    PRN Medications: sodium chloride, acetaminophen, albuterol, ALPRAZolam, fentaNYL, midazolam, ondansetron (ZOFRAN) IV, DISCONTD: sodium chloride, DISCONTD: sodium chloride   Assessment:  1. A/C respiratory failure  2. A/c systolic HF EF 30-35%  3. O2 dependent COPD  4. Chronic renal failure  5. DM2  6. Morbid obesity  7. Chest pain with negative troponins: LHC on 10/15 with minimal coronary disease.  8. Anemia 9. A/C renal failure 10. AF with RVR -> NSR on amio -> AF   Plan/Discussion:    1. Patient in afib with RVR this morning.  Would bolus with 150 mg IV amiodarone followed by gtt for rate control and possible conversion.  She is on heparin gtt.  2. CHF: Acute/chronic systolic, EF 40-45% with elevated PA pressure and CVP.  Unable to wedge this morning.  She is not getting po torsemide (has refused OG tube).  I suspect she has significant RV failure though RV apparently not well-visualized on prior echo.  - Lasix 80 mg IV bid, aim for negative I/Os.  -  Stop low dose NTG gtt.  - Hydralazine 10 mg IV q6 hrs for afterload reduction.  - Limited echo to look at RV 3. Respiratory failure: There certainly is a component of CHF but also severe COPD and probable OHS.  Overall poor prognosis given clinical course so far.   Marca Ancona 10/27/2011 8:12 AM

## 2011-10-28 ENCOUNTER — Inpatient Hospital Stay (HOSPITAL_COMMUNITY): Payer: Medicare Other

## 2011-10-28 DIAGNOSIS — R0602 Shortness of breath: Secondary | ICD-10-CM

## 2011-10-28 LAB — GLUCOSE, CAPILLARY
Glucose-Capillary: 210 mg/dL — ABNORMAL HIGH (ref 70–99)
Glucose-Capillary: 216 mg/dL — ABNORMAL HIGH (ref 70–99)

## 2011-10-28 LAB — TRIGLYCERIDES: Triglycerides: 140 mg/dL (ref <150)

## 2011-10-28 LAB — BASIC METABOLIC PANEL
CO2: 37 mEq/L — ABNORMAL HIGH (ref 19–32)
Chloride: 100 mEq/L (ref 96–112)
Glucose, Bld: 224 mg/dL — ABNORMAL HIGH (ref 70–99)
Sodium: 148 mEq/L — ABNORMAL HIGH (ref 135–145)

## 2011-10-28 LAB — CBC
Hemoglobin: 10.4 g/dL — ABNORMAL LOW (ref 12.0–15.0)
MCH: 28.5 pg (ref 26.0–34.0)
Platelets: 205 10*3/uL (ref 150–400)
RBC: 3.65 MIL/uL — ABNORMAL LOW (ref 3.87–5.11)
WBC: 7.7 10*3/uL (ref 4.0–10.5)

## 2011-10-28 MED ORDER — IPRATROPIUM-ALBUTEROL 18-103 MCG/ACT IN AERO
4.0000 | INHALATION_SPRAY | Freq: Three times a day (TID) | RESPIRATORY_TRACT | Status: DC
  Administered 2011-10-28 – 2011-10-30 (×6): 4 via RESPIRATORY_TRACT

## 2011-10-28 MED ORDER — ADULT MULTIVITAMIN LIQUID CH
5.0000 mL | Freq: Every day | ORAL | Status: DC
  Administered 2011-10-28 – 2011-10-29 (×2): 5 mL
  Filled 2011-10-28 (×3): qty 5

## 2011-10-28 MED ORDER — INSULIN GLARGINE 100 UNIT/ML ~~LOC~~ SOLN
5.0000 [IU] | Freq: Every day | SUBCUTANEOUS | Status: DC
  Administered 2011-10-28 – 2011-10-29 (×2): 5 [IU] via SUBCUTANEOUS

## 2011-10-28 MED ORDER — NESIRITIDE 1.5 MG IV SOLR
0.0100 ug/kg/min | INTRAVENOUS | Status: DC
  Administered 2011-10-28 – 2011-10-29 (×4): 0.01 ug/kg/min via INTRAVENOUS
  Filled 2011-10-28 (×4): qty 5

## 2011-10-28 MED ORDER — POTASSIUM CHLORIDE 10 MEQ/100ML IV SOLN
10.0000 meq | INTRAVENOUS | Status: AC
  Administered 2011-10-28 (×2): 10 meq via INTRAVENOUS
  Filled 2011-10-28: qty 100
  Filled 2011-10-28: qty 200

## 2011-10-28 MED ORDER — FREE WATER
200.0000 mL | Freq: Three times a day (TID) | Status: DC
  Administered 2011-10-28 – 2011-10-29 (×4): 200 mL

## 2011-10-28 MED ORDER — INSULIN ASPART 100 UNIT/ML ~~LOC~~ SOLN
0.0000 [IU] | SUBCUTANEOUS | Status: DC
  Administered 2011-10-28: 8 [IU] via SUBCUTANEOUS
  Administered 2011-10-29: 2 [IU] via SUBCUTANEOUS
  Administered 2011-10-29 (×3): 8 [IU] via SUBCUTANEOUS
  Administered 2011-10-29 – 2011-10-30 (×3): 3 [IU] via SUBCUTANEOUS

## 2011-10-28 MED ORDER — JEVITY 1.2 CAL PO LIQD
1000.0000 mL | ORAL | Status: DC
  Administered 2011-10-28: 13:00:00
  Administered 2011-10-29: 1000 mL
  Filled 2011-10-28 (×4): qty 1000

## 2011-10-28 MED ORDER — PRO-STAT SUGAR FREE PO LIQD
60.0000 mL | Freq: Three times a day (TID) | ORAL | Status: DC
  Administered 2011-10-28 – 2011-10-29 (×6): 60 mL
  Filled 2011-10-28 (×11): qty 60

## 2011-10-28 MED ORDER — FENTANYL CITRATE 0.05 MG/ML IJ SOLN
100.0000 ug | Freq: Once | INTRAMUSCULAR | Status: AC
  Administered 2011-10-28: 100 ug via INTRAVENOUS

## 2011-10-28 NOTE — Progress Notes (Signed)
Name: [name] MRN: [id number] DOB: 09/28/1945    LOS: 9  Referring Provider:  EDP -Patria Mane Reason for Referral:  Acute Respiratory Failure   PULMONARY / CRITICAL CARE MEDICINE 66 yo female with h/o COPD (4L at home), DM2, systolic CHF (EF: 40-45%), morbid obesity, Chronic Renal Insufficiency (Cr baseline:1.5) who presented in acute respiratory failure and was intubated.   Events Since Admission: 10/8 - Admit with chest pain, respiratory failure, AMS. Intubated 10/8 10/09: episode of a-fib with chest discomfort. Started on amiodarone on 10/10 10/10: extubated  10/11: reintubated 10/13: extubated 10:14: reintubated 10:15: cardiac cath:  Subjective:  10/14 Re-intubated due to respiratory insufficiency 10/15 cath Increased respiratory distress on wean on 10/16, neg balance 4 liters, wedge 30  Vital Signs: Temp:  [97.7 F (36.5 C)-98.2 F (36.8 C)] 98.2 F (36.8 C) (10/17 0000) Pulse Rate:  [60-129] 90  (10/17 0729) Resp:  [15-28] 16  (10/17 0729) BP: (109-184)/(39-137) 147/71 mmHg (10/17 0729) SpO2:  [89 %-99 %] 98 % (10/17 0739) FiO2 (%):  [40 %] 40 % (10/17 0739) Weight:  [254 lb 13.6 oz (115.6 kg)] 254 lb 13.6 oz (115.6 kg) (10/17 0500)  Intake/Output Summary (Last 24 hours) at 10/28/11 0806 Last data filed at 10/28/11 0600  Gross per 24 hour  Intake 1319.2 ml  Output   5350 ml  Net -4030.8 ml   Physical Examination: General:  Morbidly obese on vent Neuro:  Alert and oriented x4 HEENT:  Mm pink/moist Cardiovascular:  S1s2, regular rhythm Lungs:  Coarse breath sounds Abdomen:  Obese/soft, non tender Musculoskeletal:  No acute deformities Skin:  Intact, rash under pannus   Active Problems:  Pulmonary edema  Hypoxemia  COPD (chronic obstructive pulmonary disease)  Atrial fibrillation  Acute on chronic diastolic heart failure  Hypokalemia   ASSESSMENT AND PLAN  PULMONARY  Lab 10/23/11 0549 10/22/11 0932 10/22/11 0513  PHART -- 7.313* --  PCO2ART --  63.4* --  PO2ART -- 82.9 --  HCO3 -- 31.2* --  O2SAT 59.2 94.3 53.0   Ventilator Settings: Vent Mode:  [-] PSV FiO2 (%):  [40 %] 40 % Set Rate:  [20 bmp] 20 bmp Vt Set:  [500 mL] 500 mL PEEP:  [5 cmH20] 5 cmH20 Pressure Support:  [5 cmH20] 5 cmH20 Plateau Pressure:  [9 cmH20-25 cmH20] 25 cmH20 CXR:  10/8 >>>pulmonary edema 10/9 >>>pulm edema, slight improvement from 10/8 10/10: slight improvement of interstitial pulmonary edema 10/15>>> mild worsening pulmonary edema 10/16: Vascular congestion improved. Persistent opacity at the lung bases. 10/17: improving pulmonary edema ETT:  10/8>>>10/10 Reintubation: 10/11>>>10/13 Extubation: 10/13 Re-intubation 10/14 A:   Acute Hypercarbic Respiratory Failure, now recurrent failure 10/11 despite diuresis and adequate CVP. ? Due to OSA/OHS + anxiety, ? Contribution of bronchitis/HCAP + COPD (curently wheezing 10/12) COPD - on spiriva, symbicort, BD's at home.  rx with steroids 10/12 for bronchospasm  CHF systolic and diastolic (EF:40-45%)>>now EF: 40-98% exacerbation likely contributed to initial respiratory failure, potential for CAD exacerbating respiratory failure.  P:    - bronchospasm: nebs, avoid beta blockers.  -pcxr in am  - discuss long term management of respiratory distress and future re-intubations, would favor NCb and extuibation in 48 hrs , and hope for best, will finalize this - continue to attempt wean cpa 5 ps 5, no extubation planned, wuold want furtehr neg balance and wedge reduction Maximize negative balance and afterload reduction and niseritide, agree Limit steroids to q8h  CARDIOVASCULAR No results found for this basename: TROPONINI:5,LATICACIDVEN:5, O2SATVEN:5,PROBNP:5 in  the last 168 hours ECG:  SR on monitor.  EKG with PVC's, no acute ST changes 10/9: PVC's, sinus tach, no ST changes 10/10: new onset atrial fibrillation  Lines:  10/8 L IJ TLC>>> 10/11 Right IJ Theone Murdoch >>>  Echo: 10/09: Study  Conclusions  - Left ventricle: The cavity size was moderately dilated. Wall thickness was normal. Systolic function was mildly to moderately reduced. The estimated ejection fraction was in the range of 40% to 45%. Due to first degree atrioventricular block, there was fusion of early and atrial contributions to ventricular filling. - Left atrium: The atrium was mildly dilated. - Pulmonary arteries: PA peak pressure: 31mm Hg (S).  A:  A-fib- currently on gtt.  Chest Pain - acute chest pain prior to arrival. Chest pain overnight. Negative trop.   Hx of HTN - on norvasc, lasix at home Systolic/diastolic CHF: EF:40-45%. Respiratory decompensation could be associated with increased RLV stiffness. - cath non obstructing CAD with increased LVEDP Repeat Echo: 10/16: 20-25% EF with global hypokinesis-non ischemic cardiomyopathy P:   - PA-c placed 10/11 to guide volume status, PCWP elevated at 30 and CVP elevated at 10.   - continue lasix IV 80 bid per cards, excellent output noted - Continue amiodarone gtt for today. Heparin 10/10, probably xarelto once stabilized - Continue ASA - Hydralazine and NTG gtt stopped. nesiritide added, agree, will drop wedge and symtoms Avoid all beta blocker with bronchospasm tsh low, assess t3, t4  RENAL  Lab 10/28/11 0523 10/27/11 0300 10/26/11 0435 10/25/11 0430 10/24/11 1900 10/24/11 0500 10/22/11 1841  NA 148* 145 144 143 144 -- --  K 3.6 4.4 -- -- -- -- --  CL 100 105 100 98 98 -- --  CO2 37* 34* 34* 35* 33* -- --  BUN 63* 60* 57* 52* 50* -- --  CREATININE 1.15* 1.71* 1.84* 1.84* 1.71* -- --  CALCIUM 9.3 9.2 9.1 8.9 9.2 -- --  MG -- -- -- 1.9 -- 1.9 1.8  PHOS -- -- -- 4.0 -- 4.2 --   Intake/Output      10/16 0701 - 10/17 0700 10/17 0701 - 10/18 0700   I.V. (mL/kg) 1358.7 (11.8)    IV Piggyback     Total Intake(mL/kg) 1358.7 (11.8)    Urine (mL/kg/hr) 5350 (1.9)    Total Output 5350    Net -3991.3          Foley:  10/8 >>>  A:   Acute  Renal Insufficiency on chronic renal insufficiency: baseline Cr: 1.5 -on chronic lasix at home  P:   - BMP improved - continue lasix, add small amount free water as we continue lasix - BMP in am  GASTROINTESTINAL  Lab 10/22/11 0300  AST 26  ALT 40*  ALKPHOS 64  BILITOT 0.3  PROT 6.9  ALBUMIN 2.7*   A:   Morbid Obesity Mild Elevation of LFT's - normalizing  P:   TF restart ppi  HEMATOLOGIC  Lab 10/28/11 0523 10/27/11 0300 10/26/11 0500 10/26/11 0435 10/25/11 0430 10/24/11 0500  HGB 10.4* 9.5* -- 9.5* 9.1* 9.1*  HCT 33.2* 31.4* -- 30.9* 29.3* 26.8*  PLT 205 211 -- 206 237 233  INR -- -- 1.19 -- -- --  APTT -- -- -- -- -- --   A:   No acute issues.  Hx of Anemia (Hg:10.5) stable  At risk 25% copd have PE acute P:  -monitor cbc in the setting of heparin -heparin drip -doppler legs - neg, no role  CT chest  INFECTIOUS  Lab 10/28/11 0523 10/27/11 0300 10/26/11 0435 10/25/11 0430 10/24/11 0500  WBC 7.7 6.6 7.3 7.5 4.1  PROCALCITON -- -- -- -- --   Cultures: 10/8 BCx2>>>no growth to date 10/8 Sputum>>> not collected 10/8 UA>>> negative 10/8 urine strep>>> not collected 10/8 urine legionella >>> not collected Antibiotics: Zosyn (?HCAP) 10/8>>> 10/15 Vanco (?HCAP) 10/8>>> 10/10 Diflucan (yeast under pannus) 10/8>>>10/10  A:   Bilateral airspace disease -likely edema but difficult to interperet due to body habitus Empiric vanc and zosyn for HCAP on 10/10. Vanc d/ced 10/10. Zosyn d/ced 10/15 P:   No evidence infection: patient afebrile with normal WBC \\no  role bronch, i think mainstay is neg balance and wedge reduction  ENDOCRINE  Lab 10/28/11 0425 10/27/11 2342 10/27/11 2022 10/27/11 1626 10/27/11 1139  GLUCAP 210* 216* 196* 206* 178*   A:   DM - on 70/30 at home 30 units BID    A1C: 7.3 P:   -ICU SSI Low temp prior, TSH: 0.236: low. Obtain T3, T4  Add lantus 5  NEUROLOGIC 10/8 head CT>>> no acute process A:   AMS - likely related to hypercarbia.  Now back to baseline Hx anxiety / depression - on xanax, celexa at home P:   Continue fentanyl and alprazolam prn Versed 1mg  q6, m aintain  Anxiety, TSH: pending On home citalopram  BEST PRACTICE / DISPOSITION Level of Care:  ICU Primary Service:  PCCM Consultants:  cardiology Code Status:  FULL Diet: NPO DVT Px:  heparin gtt GI Px:  Protonix Skin Integrity:  Rash as above Social / Family: son updated at bedside 10/25/11  Ccm time 30 min   Mcarthur Rossetti. Tyson Alias, MD, FACP Pgr: [phone] [facility]

## 2011-10-28 NOTE — Progress Notes (Addendum)
Inpatient Diabetes Program Recommendations  AACE/ADA: New Consensus Statement on Inpatient Glycemic Control (2013)  Target Ranges:  Prepandial:   less than 140 mg/dL      Peak postprandial:   less than 180 mg/dL (1-2 hours)      Critically ill patients:  140 - 180 mg/dL   Reason for Visit: Results for [name], [name] (MRN [id number]) as of 10/28/2011 10:54  Ref. Range 10/27/2011 16:26 10/27/2011 20:22 10/27/2011 23:42 10/28/2011 04:25 10/28/2011 08:45  Glucose-Capillary Latest Range: 70-99 mg/dL 295 (H) 284 (H) 132 (H) 210 (H) 205 (H)   CBG's greater than 200 mg/dL.  Patient is on Phase 1 of Glycemic Control Protocol.  Meets criteria for Phase 2-IV insulin.  Note Lantus 5 units started today.  Will discuss with RN.         Addendum:  If IV insulin not indicated, consider discontinuation of ICU glycemic control protocol and start moderate correction q 4 hours.  Will likely need Lantus increased to 15 units daily.

## 2011-10-28 NOTE — Progress Notes (Deleted)
[name] went into vtach.  Patients eyes were rolled to the ceiling, and grey in color.  [name] was raising arms up and down.  RT in room to extubate.    Dr. Marin Shutter was called and said to administer 5mg  of IV lopressor.  Lopressor was administered.  [name] noted to be in pulseless vtach.  Family at bedside.  Chaplain called to bedside.  IV amino, fentanyl and saline stopped.  Family updated on [name] status.  Will continue to monitor vtach at this time.  Dr. Marin Shutter notified, will call with time of death. Anik Wesch B

## 2011-10-28 NOTE — Progress Notes (Signed)
VASCULAR LAB PRELIMINARY  PRELIMINARY  PRELIMINARY  PRELIMINARY  Bilateral lower extremity venous Dopplers completed.    Preliminary report:  No obvious evidence of DVT or SVT noted in the bilateral lower extremities.  Dannell Gortney, 10/28/2011, 10:10 AM

## 2011-10-28 NOTE — Progress Notes (Addendum)
Patient ID: [name], female   DOB: [birth year], 66 y.o.   MRN: [id number] Advanced Heart Failure Rounding Note   Subjective:    [name] is a 66 y/o woman with multiple medical problems including morbid obesity, COPD on 4L home O2, systolic CHF with EF 40-45% DM2, HTN, CRI (baseline ~1.50) and HL. She has had multiple admissions for respiratory failure.   Came to ER 10/8 for two days of CP and progressive dyspnea. Found to be in respiratory failure. ABG 7.1/120/163/98% on facemask. Intubated in ER for PNA +/- HF (Baseline weight 273)  Developed AF -> amio. Re-intubated 10/11 for recurrent resp failure.  Extubated 10/13.  Re-intubated 10/14.  Cath 10/26/11: Minimal nonobstructive CAD   CVP 10-12 PA 59/24 PCWP 30  Yesterday she diuresed well on IV Lasix and weight is down.  Net negative -3991.  BUN stable, creatinine lower.  She is back in NSR on amiodarone gtt.  BP still on the high side.     Echo done yesterday to try to evaluate the RV did not show the RV well but EF was 20-25% with global hypokinesis.   Objective:   Weight Range:  Vital Signs:   Temp:  [97.7 F (36.5 C)-98.4 F (36.9 C)] 98.2 F (36.8 C) (10/17 0000) Pulse Rate:  [60-129] 78  (10/17 0500) Resp:  [15-28] 23  (10/17 0500) BP: (109-184)/(39-137) 154/63 mmHg (10/17 0500) SpO2:  [89 %-99 %] 97 % (10/17 0500) FiO2 (%):  [40 %] 40 % (10/17 0400) Weight:  [254 lb 13.6 oz (115.6 kg)] 254 lb 13.6 oz (115.6 kg) (10/17 0500) Last BM Date: 10/24/11  Weight change: Filed Weights   10/26/11 0500 10/27/11 0458 10/28/11 0500  Weight: 264 lb 8.8 oz (120 kg) 265 lb 10.5 oz (120.5 kg) 254 lb 13.6 oz (115.6 kg)    Intake/Output:   Intake/Output Summary (Last 24 hours) at 10/28/11 0725 Last data filed at 10/28/11 0600  Gross per 24 hour  Intake 1358.7 ml  Output   5350 ml  Net -3991.3 ml     Physical Exam: CVP 10-12, PCWP 30 General: Morbidly obese. Neuro: Awake, alert. Follows commands HEENT: Normal    Cardiovascular: PMI nonpalpable. Distant HS s1s2 Regular Lungs:Mild rhonchi Abdomen: Obese/soft,  Nontender. Good BS  Musculoskeletal: No acute deformities  Skin: Intact, rash under pannus, long unkempt toenails  Extremities: 1+ edema RLE/LLE , RUE art line in place . PAS in place     Telemetry: Sinus rhythm  Labs: Basic Metabolic Panel:  Lab 10/28/11 3086 10/27/11 0300 10/26/11 0435 10/25/11 0430 10/24/11 1900 10/24/11 0500 10/22/11 1841  NA 148* 145 144 143 144 -- --  K 3.6 4.4 3.7 3.5 3.5 -- --  CL 100 105 100 98 98 -- --  CO2 37* 34* 34* 35* 33* -- --  GLUCOSE 224* 153* 189* 204* 236* -- --  BUN 63* 60* 57* 52* 50* -- --  CREATININE 1.15* 1.71* 1.84* 1.84* 1.71* -- --  CALCIUM 9.3 9.2 9.1 -- -- -- --  MG -- -- -- 1.9 -- 1.9 1.8  PHOS -- -- -- 4.0 -- 4.2 --    Liver Function Tests:  Lab 10/22/11 0300  AST 26  ALT 40*  ALKPHOS 64  BILITOT 0.3  PROT 6.9  ALBUMIN 2.7*   No results found for this basename: LIPASE:5,AMYLASE:5 in the last 168 hours No results found for this basename: AMMONIA:3 in the last 168 hours  CBC:  Lab 10/28/11 0523 10/27/11 0300 10/26/11  7253 10/25/11 0430 10/24/11 0500  WBC 7.7 6.6 7.3 7.5 4.1  NEUTROABS -- 5.9 -- -- --  HGB 10.4* 9.5* 9.5* 9.1* 9.1*  HCT 33.2* 31.4* 30.9* 29.3* 26.8*  MCV 91.0 90.5 92.0 92.7 94.4  PLT 205 211 206 237 233    Cardiac Enzymes: No results found for this basename: CKTOTAL:5,CKMB:5,CKMBINDEX:5,TROPONINI:5 in the last 168 hours  BNP: BNP (last 3 results)  Basename 10/21/11 0455 10/19/11 1453 06/26/11 0526  PROBNP 13824.0* 9736.0* 7258.0*     Other results:     Imaging: Dg Chest Port 1 View  10/27/2011  *RADIOLOGY REPORT*  Clinical Data: Endotracheal tube placement.  Respiratory failure  PORTABLE CHEST - 1 VIEW  Comparison: 10/27/2011  Findings: Endotracheal tube remains in good position.  Swan-Ganz catheter tip in the right lower pulmonary artery is unchanged. Left jugular catheter tip in the SVC  is unchanged.  NG tube extends into the stomach.  Bibasilar airspace disease shows mild improvement.  This may be due to edema or pneumonia.  IMPRESSION: Support lines remain in good position.  No pneumothorax  Improved aeration in the lung bases.  Bibasilar airspace disease may be edema or pneumonia.   Original Report Authenticated By: Camelia Phenes, M.D.    Dg Chest Port 1 View  10/27/2011  *RADIOLOGY REPORT*  Clinical Data: Pulmonary edema  PORTABLE CHEST - 1 VIEW  Comparison: Yesterday  Findings: Lung bases not included limiting the exam.  Endotracheal tube, left internal jugular central venous catheter, right internal jugular Swan-Ganz stable. Vascular congestion improved.  Persistent bibasilar opacity.  No pneumothorax.  IMPRESSION: Vascular congestion improved.  Persistent opacity at the lung bases.   Original Report Authenticated By: Donavan Burnet, M.D.      Medications:     Scheduled Medications:    . albuterol-ipratropium  4 puff Inhalation Q6H  . amiodarone  150 mg Intravenous Once  . antiseptic oral rinse  15 mL Mouth Rinse QID  . chlorhexidine  15 mL Mouth Rinse BID  . Chlorhexidine Gluconate Cloth  6 each Topical Q0600  . citalopram  20 mg Oral Daily  . etomidate      . etomidate  15 mg Intravenous Once  . fentaNYL  100 mcg Intravenous Once  . ferrous sulfate  300 mg Per Tube Q breakfast  . furosemide  80 mg Intravenous BID  . hydrALAZINE  10 mg Intravenous Q6H  . insulin aspart  2-6 Units Subcutaneous Q4H  . methylPREDNISolone (SOLU-MEDROL) injection  80 mg Intravenous Q6H  . midazolam  1 mg Intravenous Q6H  . midazolam  2 mg Intravenous Once  . midazolam  2 mg Intravenous Once  . mupirocin ointment   Nasal BID  . pantoprazole (PROTONIX) IV  40 mg Intravenous QHS  . DISCONTD: amiodarone  200 mg Oral BID  . DISCONTD: midazolam  1 mg Intravenous Q6H  . DISCONTD: torsemide  20 mg Oral BID    Infusions:    . sodium chloride 20 mL/hr at 10/27/11 0545  .  amiodarone (NEXTERONE PREMIX) 360 mg/200 mL dextrose 60 mg/hr (10/27/11 1106)   And  . amiodarone (NEXTERONE PREMIX) 360 mg/200 mL dextrose 30 mg/hr (10/27/11 2239)  . heparin 1,800 Units/hr (10/27/11 0101)  . DISCONTD: nitroGLYCERIN Stopped (10/26/11 0958)    PRN Medications: sodium chloride, albuterol, ALPRAZolam, fentaNYL, midazolam, perflutren lipid microspheres (DEFINITY) IV suspension, DISCONTD: acetaminophen, DISCONTD: ondansetron (ZOFRAN) IV   Assessment:  1. A/C respiratory failure  2. A/c systolic HF EF 20-25%, nonischemic  3.  O2 dependent COPD  4. Chronic renal failure  5. DM2  6. Morbid obesity  7. Chest pain with negative troponins: LHC on 10/15 with minimal coronary disease.  8. Anemia 9. A/C renal failure 10. AF with RVR -> NSR on amio -> AF->NSR on amio gtt   Plan/Discussion:    1. Atrial fibrillation: Patient back in NSR on amiodarone gtt.  Continue gtt for today.  She is on heparin gtt.  2. CHF: Acute/chronic systolic, EF 20-25%.  Nonischemic cardiomyopathy.  She diuresed well yesterday on IV Lasix and weight is down but PCWP and CVP still high.  - Continue Lasix 80 mg IV bid, aim for negative I/Os.  - Stop low dose NTG gtt and hydralazine.  - Given plenty of BP room and low EF, aim for maximization of afterload reduction with nesiritide gtt, start now.  3. Respiratory failure: There certainly is a component of CHF but also severe COPD and probable OHS.  She is on steroids and has finished antibiotics course.  ? Role for bronchoscopy.  Overall poor prognosis given clinical course so far.  4. CKD: Creatinine better but BUN trending up with diuresis.  Follow closely.   Marca Ancona 10/28/2011 7:25 AM

## 2011-10-28 NOTE — Progress Notes (Signed)
  ANTICOAGULATION CONSULT NOTE - Follow Up Consult  Pharmacy Consult for Heparin Indication: atrial fibrillation  Allergies  Allergen Reactions  . Pioglitazone     REACTION: edema  . Rosiglitazone Maleate Nausea And Vomiting    Patient Measurements: Height: 5\' 6"  (167.6 cm) Weight: 254 lb 13.6 oz (115.6 kg) IBW/kg (Calculated) : 59.3  Heparin Dosing Weight: 87 kg  Vital Signs: Temp: 98.2 F (36.8 C) (10/17 0000) Temp src: Core (Comment) (10/17 0000) BP: 137/71 mmHg (10/17 1000) Pulse Rate: 76  (10/17 1000)  Labs:  Basename 10/28/11 0523 10/27/11 0300 10/26/11 0500 10/26/11 0435  HGB 10.4* 9.5* -- --  HCT 33.2* 31.4* -- 30.9*  PLT 205 211 -- 206  APTT -- -- -- --  LABPROT -- -- 14.9 --  INR -- -- 1.19 --  HEPARINUNFRC 0.42 0.51 -- 0.84*  CREATININE 1.15* 1.71* -- 1.84*  CKTOTAL -- -- -- --  CKMB -- -- -- --  TROPONINI -- -- -- --    Estimated Creatinine Clearance: 62.1 ml/min (by C-G formula based on Cr of 1.15).   Assessment: 66 yo female on heparin for atrial fibrillation. Heparin level 0.42 at goal on 1800 units/hr. Hemoglobin, hematocrit, and platelets stable. No reported bleeding.  Goal of Therapy:  Heparin level 0.3-0.7 units/ml Monitor platelets by anticoagulation protocol: Yes   Plan:  1. Continue IV heparin at 1800 units/hr 2. Monitor daily CBC and heparin level  Juanita Craver PharmD Candidate 10/28/2011,10:47 AM  I have reviewed this patient with Misty Stanley and agree with her assessment and plan.  Estella Husk, Pharm.D., BCPS Clinical Pharmacist  Phone [phone] Pager [phone] 10/28/2011, 11:21 AM

## 2011-10-28 NOTE — Progress Notes (Signed)
Quarter size red mark between shoulder blades on back that white milky puss was compressed out of at 0600 this morning.  Site is tender.  Mepilex applied to site.

## 2011-10-28 NOTE — Progress Notes (Signed)
Code Status Discussion: Conversation regarding code status was done at bedside with patient's daughter, son and two sisters. Patient was alert and oriented x4 and agreed that if she were to go in cardiac arrest, chest compressions and ACLS would not be initiated. She also agreed that once extubated, if she were to develop recurrent respiratory failure, she would not be re-intubated and would rather receive comfort care. We will maximize medical therapy with aggressive diuresis and will keep the patient intubated until her cardiopulmonary status improves.  Code status updated to DNR/DNI, maintaining current therapies.    [name], MD Family Medicine Resident, PGY-2

## 2011-10-28 NOTE — Progress Notes (Addendum)
Nutrition Follow-up / Consult  Intervention:    Start TF via OG tube with Jevity 1.2 at 25 ml/h (goal rate) with 60 ml Prostat TID to provide 1320 kcals (22 kcals/kg ideal weight), 123 gm protein, 486 ml free water daily.   Assessment:   Patient was extubated on 10/13, re-intubated 10/14.  S/P cardiac cath 10/15.  OG tube is in place.  Patient has been on and off of a PO diet since admission depending on vent status.  Minimal intake since admission, which puts patient at increased nutrition risk.  Received consult for TF initiation and management.  Patient is currently intubated on ventilator support.  MV: 9 Temp:Temp (24hrs), Avg:97.9 F (36.6 C), Min:97.7 F (36.5 C), Max:98.2 F (36.8 C)  Diet Order:  NPO  Meds: Scheduled Meds:   . albuterol-ipratropium  4 puff Inhalation Q6H  . amiodarone  150 mg Intravenous Once  . antiseptic oral rinse  15 mL Mouth Rinse QID  . chlorhexidine  15 mL Mouth Rinse BID  . Chlorhexidine Gluconate Cloth  6 each Topical Q0600  . citalopram  20 mg Oral Daily  . etomidate      . etomidate  15 mg Intravenous Once  . fentaNYL  100 mcg Intravenous Once  . ferrous sulfate  300 mg Per Tube Q breakfast  . furosemide  80 mg Intravenous BID  . insulin aspart  2-6 Units Subcutaneous Q4H  . methylPREDNISolone (SOLU-MEDROL) injection  80 mg Intravenous Q6H  . midazolam  1 mg Intravenous Q6H  . midazolam  2 mg Intravenous Once  . midazolam  2 mg Intravenous Once  . mupirocin ointment   Nasal BID  . pantoprazole (PROTONIX) IV  40 mg Intravenous QHS  . potassium chloride  10 mEq Intravenous Q1 Hr x 2  . DISCONTD: hydrALAZINE  10 mg Intravenous Q6H  . DISCONTD: midazolam  1 mg Intravenous Q6H   Continuous Infusions:   . sodium chloride 20 mL/hr at 10/27/11 0545  . amiodarone (NEXTERONE PREMIX) 360 mg/200 mL dextrose 60 mg/hr (10/27/11 1106)   And  . amiodarone (NEXTERONE PREMIX) 360 mg/200 mL dextrose 30 mg/hr (10/27/11 2239)  . heparin 1,800 Units/hr  (10/28/11 0854)  . nesiritide (NATRECOR) infusion 0.01 mcg/kg/min (10/28/11 0855)   PRN Meds:.sodium chloride, albuterol, ALPRAZolam, fentaNYL, midazolam, perflutren lipid microspheres (DEFINITY) IV suspension, DISCONTD: acetaminophen, DISCONTD: ondansetron (ZOFRAN) IV  Labs:  CMP     Component Value Date/Time   NA 148* 10/28/2011 0523   K 3.6 10/28/2011 0523   CL 100 10/28/2011 0523   CO2 37* 10/28/2011 0523   GLUCOSE 224* 10/28/2011 0523   BUN 63* 10/28/2011 0523   CREATININE 1.15* 10/28/2011 0523   CALCIUM 9.3 10/28/2011 0523   CALCIUM 9.1 09/03/2010 1643   PROT 6.9 10/22/2011 0300   ALBUMIN 2.7* 10/22/2011 0300   AST 26 10/22/2011 0300   ALT 40* 10/22/2011 0300   ALKPHOS 64 10/22/2011 0300   BILITOT 0.3 10/22/2011 0300   GFRNONAA 48* 10/28/2011 0523   GFRAA 56* 10/28/2011 0523     Intake/Output Summary (Last 24 hours) at 10/28/11 0940 Last data filed at 10/28/11 0800  Gross per 24 hour  Intake 1279.7 ml  Output   5250 ml  Net -3970.3 ml    Weight Status:  115.6 kg down from 121.9 kg on 10/11 with negative fluid balance; BMI=41.2.  Re-estimated needs:  1850 kcals, 110-125 gm protein, 1.9-2.1 liters fluid daily  Nutrition Dx:  Inadequate oral intake, ongoing.  Goal:  Enteral  nutrition to provide 60-70% of estimated calorie needs (22-25 kcals/kg ideal body weight) and >/= 90% of estimated protein needs, based on ASPEN guidelines for permissive underfeeding in critically ill obese individuals, unmet.  Monitor:  TF tolerance, labs, weight trend, I/O   Joaquin Courts, RD, LDN, CNSC Pager# [phone] After Hours Pager# [phone]

## 2011-10-29 ENCOUNTER — Inpatient Hospital Stay (HOSPITAL_COMMUNITY): Payer: Medicare Other

## 2011-10-29 LAB — GLUCOSE, CAPILLARY
Glucose-Capillary: 181 mg/dL — ABNORMAL HIGH (ref 70–99)
Glucose-Capillary: 249 mg/dL — ABNORMAL HIGH (ref 70–99)
Glucose-Capillary: 272 mg/dL — ABNORMAL HIGH (ref 70–99)
Glucose-Capillary: 185 mg/dL — ABNORMAL HIGH (ref 70–99)
Glucose-Capillary: 286 mg/dL — ABNORMAL HIGH (ref 70–99)

## 2011-10-29 LAB — T3, FREE: T3, Free: 1.8 pg/mL — ABNORMAL LOW (ref 2.3–4.2)

## 2011-10-29 LAB — T4, FREE: Free T4: 1.67 ng/dL (ref 0.80–1.80)

## 2011-10-29 LAB — BASIC METABOLIC PANEL
BUN: 70 mg/dL — ABNORMAL HIGH (ref 6–23)
CO2: 42 mEq/L (ref 19–32)
CO2: 45 mEq/L (ref 19–32)
Calcium: 9.2 mg/dL (ref 8.4–10.5)
Chloride: 100 mEq/L (ref 96–112)
Chloride: 98 mEq/L (ref 96–112)
Creatinine, Ser: 1.6 mg/dL — ABNORMAL HIGH (ref 0.50–1.10)
Creatinine, Ser: 1.54 mg/dL — ABNORMAL HIGH (ref 0.50–1.10)
Glucose, Bld: 93 mg/dL (ref 70–99)
Glucose, Bld: 283 mg/dL — ABNORMAL HIGH (ref 70–99)

## 2011-10-29 LAB — CBC
HCT: 34.6 % — ABNORMAL LOW (ref 36.0–46.0)
Hemoglobin: 10.6 g/dL — ABNORMAL LOW (ref 12.0–15.0)
MCH: 27.8 pg (ref 26.0–34.0)
MCV: 90.8 fL (ref 78.0–100.0)
Platelets: 215 10*3/uL (ref 150–400)
RBC: 3.81 MIL/uL — ABNORMAL LOW (ref 3.87–5.11)
WBC: 9.4 10*3/uL (ref 4.0–10.5)

## 2011-10-29 LAB — HEPARIN LEVEL (UNFRACTIONATED)
Heparin Unfractionated: 1.21 IU/mL — ABNORMAL HIGH (ref 0.30–0.70)
Heparin Unfractionated: 0.78 IU/mL — ABNORMAL HIGH (ref 0.30–0.70)

## 2011-10-29 MED ORDER — LORAZEPAM 2 MG/ML IJ SOLN
2.0000 mg | Freq: Three times a day (TID) | INTRAMUSCULAR | Status: DC
  Administered 2011-10-29 – 2011-10-31 (×5): 2 mg via INTRAVENOUS
  Filled 2011-10-29 (×5): qty 1

## 2011-10-29 MED ORDER — PANTOPRAZOLE SODIUM 40 MG PO PACK
40.0000 mg | PACK | Freq: Every day | ORAL | Status: DC
  Administered 2011-10-29: 40 mg
  Filled 2011-10-29 (×2): qty 20

## 2011-10-29 MED ORDER — POTASSIUM CHLORIDE 20 MEQ PO PACK: 40.0000 meq | PACK | Freq: Once | ORAL | Status: DC

## 2011-10-29 MED ORDER — POTASSIUM CHLORIDE 20 MEQ/15ML (10%) PO LIQD
40.0000 meq | Freq: Once | ORAL | Status: AC
  Administered 2011-10-29: 40 meq via ORAL
  Filled 2011-10-29: qty 30

## 2011-10-29 MED ORDER — METHYLPREDNISOLONE SODIUM SUCC 125 MG IJ SOLR
60.0000 mg | Freq: Three times a day (TID) | INTRAMUSCULAR | Status: DC
  Administered 2011-10-30: 60 mg via INTRAVENOUS
  Filled 2011-10-29 (×4): qty 0.96

## 2011-10-29 MED ORDER — POTASSIUM CHLORIDE 10 MEQ/100ML IV SOLN: 10.0000 meq | INTRAVENOUS | Status: DC

## 2011-10-29 MED ORDER — METHYLPREDNISOLONE SODIUM SUCC 125 MG IJ SOLR
80.0000 mg | Freq: Three times a day (TID) | INTRAMUSCULAR | Status: DC
  Administered 2011-10-29: 80 mg via INTRAVENOUS
  Filled 2011-10-29 (×3): qty 1.28

## 2011-10-29 MED ORDER — FREE WATER
300.0000 mL | Freq: Three times a day (TID) | Status: DC
  Administered 2011-10-29 – 2011-10-30 (×2): 300 mL

## 2011-10-29 MED ORDER — HEPARIN (PORCINE) IN NACL 100-0.45 UNIT/ML-% IJ SOLN
1250.0000 [IU]/h | INTRAMUSCULAR | Status: DC
  Administered 2011-10-29: 1500 [IU]/h via INTRAVENOUS
  Administered 2011-10-30: 1400 [IU]/h via INTRAVENOUS
  Administered 2011-10-30 – 2011-10-31 (×2): 1100 [IU]/h via INTRAVENOUS
  Filled 2011-10-29 (×4): qty 250

## 2011-10-29 NOTE — Progress Notes (Addendum)
Critical potassium of 2.9 called to Vinnie Langton, Charity fundraiser at [employer].

## 2011-10-29 NOTE — Progress Notes (Signed)
ANTICOAGULATION CONSULT NOTE - Follow Up Consult  Pharmacy Consult for Heparin Indication: Atrial fibrillation  Allergies  Allergen Reactions  . Pioglitazone     REACTION: edema  . Rosiglitazone Maleate Nausea And Vomiting    Patient Measurements: Height: 5\' 6"  (167.6 cm) Weight: 251 lb 8.7 oz (114.1 kg) IBW/kg (Calculated) : 59.3  Heparin Dosing Weight: 87  Vital Signs: Temp: 97.8 F (36.6 C) (10/18 1700) Temp src: Core (Comment) (10/18 1200) BP: 161/68 mmHg (10/18 1700) Pulse Rate: 105  (10/18 1700)  Labs:  Basename 10/29/11 1800 10/29/11 0920 10/29/11 0420 10/28/11 0523 10/27/11 0300  HGB -- -- 10.6* 10.4* --  HCT -- -- 34.6* 33.2* 31.4*  PLT -- -- 215 205 211  APTT -- -- -- -- --  LABPROT -- -- -- -- --  INR -- -- -- -- --  HEPARINUNFRC 0.78* 1.21* 0.98* -- --  CREATININE 1.54* -- 1.60* 1.15* --  CKTOTAL -- -- -- -- --  CKMB -- -- -- -- --  TROPONINI -- -- -- -- --    Estimated Creatinine Clearance: 46.1 ml/min (by C-G formula based on Cr of 1.54).   Assessment: 66 yo female on heparin for atrial fibrillation. Heparin level remains above goal.   Hemoglobin, hematocrit, and platelets stable. No reported bleeding.   Goal of Therapy:  Heparin level 0.3-0.7 units/ml Monitor platelets by anticoagulation protocol: Yes   Plan:  1. Decrease heparin to 1400 units/hr 3. Get a heparin level in ~ 8 hours, with AM labs. 4. Monitor daily CBC and heparin level    Thank you for allowing pharmacy to be a part of this patients care team.  Lovenia Kim Pharm.D., BCPS Clinical Pharmacist 10/29/2011 7:25 PM Pager: [phone] Phone: [phone]

## 2011-10-29 NOTE — Progress Notes (Signed)
ANTICOAGULATION CONSULT NOTE - Follow Up Consult  Pharmacy Consult for Heparin Indication: Atrial fibrillation  Allergies  Allergen Reactions  . Pioglitazone     REACTION: edema  . Rosiglitazone Maleate Nausea And Vomiting    Patient Measurements: Height: 5\' 6"  (167.6 cm) Weight: 251 lb 8.7 oz (114.1 kg) IBW/kg (Calculated) : 59.3  Heparin Dosing Weight: 87  Vital Signs: Temp: 98 F (36.7 C) (10/18 0600) BP: 136/50 mmHg (10/18 0600) Pulse Rate: 50  (10/18 0600)  Labs:  Basename 10/29/11 0420 10/28/11 0523 10/27/11 0300  HGB 10.6* 10.4* --  HCT 34.6* 33.2* 31.4*  PLT 215 205 211  APTT -- -- --  LABPROT -- -- --  INR -- -- --  HEPARINUNFRC 0.98* 0.42 0.51  CREATININE 1.60* 1.15* 1.71*  CKTOTAL -- -- --  CKMB -- -- --  TROPONINI -- -- --    Estimated Creatinine Clearance: 44.3 ml/min (by C-G formula based on Cr of 1.6).   Assessment: 66 yo female on heparin for atrial fibrillation. Heparin level 0.98 supratherapeutic @ 0420 on 1800 units/hr. Repeat level 1.21 @ 0920. Hemoglobin, hematocrit, and platelets stable. No reported bleeding.   Goal of Therapy:  Heparin level 0.3-0.7 units/ml Monitor platelets by anticoagulation protocol: Yes   Plan:  1. Hold heparin for 1 hour 2. Restart heparin at 1500 units/hr 3. Get a heparin level in 6 hours 4. Monitor daily CBC and heparin level  Juanita Craver PharmD Candidate 10/29/2011,7:22 AM

## 2011-10-29 NOTE — Progress Notes (Signed)
eLink Physician-Brief Progress Note Patient Name: [name] DOB: Mar 16, 1945 MRN: [id number]  Date of Service  10/29/2011   HPI/Events of Note   Lab 10/29/11 0420 10/28/11 0523 10/27/11 0300 10/26/11 0435 10/25/11 0430 10/24/11 0500 10/22/11 1841  NA 149* 148* 145 144 143 -- --  K 2.9* 3.6 -- -- -- -- --  CL 100 100 105 100 98 -- --  CO2 42* 37* 34* 34* 35* -- --  GLUCOSE 93 224* 153* 189* 204* -- --  BUN 70* 63* 60* 57* 52* -- --  CREATININE 1.60* 1.15* 1.71* 1.84* 1.84* -- --  CALCIUM 9.1 9.3 9.2 9.1 8.9 -- --  MG -- -- -- -- 1.9 1.9 1.8  PHOS -- -- -- -- 4.0 4.2 --   Intake/Output      10/17 0701 - 10/18 0700   I.V. (mL/kg) 1564.9 (13.7)   NG/GT 340   IV Piggyback 224   Total Intake(mL/kg) 2128.9 (18.7)   Urine (mL/kg/hr) 5660 (2.1)   Total Output 5660   Net -3531.1        Estimated Creatinine Clearance: 44.3 ml/min (by C-G formula based on Cr of 1.6).    eICU Interventions  Replete KCL x4   Intervention Category Major Interventions: Electrolyte abnormality - evaluation and management  Paarth Cropper 10/29/2011, 6:52 AM

## 2011-10-29 NOTE — Progress Notes (Addendum)
Patient ID: [name], female   DOB: 20-Sep-1945, 66 y.o.   MRN: [id number] Advanced Heart Failure Rounding Note   Subjective:    [name] is a 66 y/o woman with multiple medical problems including morbid obesity, COPD on 4L home O2, systolic CHF with EF 40-45% DM2, HTN, CRI (baseline ~1.50) and HL. She has had multiple admissions for respiratory failure.   Came to ER 10/8 for two days of CP and progressive dyspnea. Found to be in respiratory failure. ABG 7.1/120/163/98% on facemask. Intubated in ER for PNA +/- HF (Baseline weight 273)  Developed AF -> amio. Re-intubated 10/11 for recurrent resp failure.  Extubated 10/13.  Re-intubated 10/14.  Cath 10/26/11: Minimal nonobstructive CAD   Yesterday she diuresed well again on IV Lasix/nesiritide and weight is down.  Net negative -3531.  Creatinine is 1.6 which is significantly increased from yesterday, but yesterday's number appears to be an outlier compared to all other creatinine readings.  PAC removed yesterday.   She is back in NSR on amiodarone gtt.      Echo done 10/16 to try to evaluate the RV did not show the RV well but EF was 20-25% with global hypokinesis.   Objective:   Weight Range:  Vital Signs:   Temp:  [97.7 F (36.5 C)-98.4 F (36.9 C)] 98 F (36.7 C) (10/18 0600) Pulse Rate:  [36-105] 46  (10/18 0715) Resp:  [13-21] 20  (10/18 0715) BP: (97-168)/(23-123) 140/48 mmHg (10/18 0715) SpO2:  [40 %-99 %] 98 % (10/18 0725) FiO2 (%):  [40 %] 40 % (10/18 0725) Weight:  [251 lb 8.7 oz (114.1 kg)] 251 lb 8.7 oz (114.1 kg) (10/18 0500) Last BM Date: 10/29/11  Weight change: Filed Weights   10/27/11 0458 10/28/11 0500 10/29/11 0500  Weight: 265 lb 10.5 oz (120.5 kg) 254 lb 13.6 oz (115.6 kg) 251 lb 8.7 oz (114.1 kg)    Intake/Output:   Intake/Output Summary (Last 24 hours) at 10/29/11 0735 Last data filed at 10/29/11 0600  Gross per 24 hour  Intake 2128.87 ml  Output   5660 ml  Net -3531.13 ml     Physical Exam:  CVP 10-12, PCWP 30 General: Morbidly obese. Neuro: Awake, alert. Follows commands HEENT: Normal  Cardiovascular: PMI nonpalpable. Distant HS s1s2 Regular Lungs:Mild rhonchi Abdomen: Obese/soft,  Nontender. Good BS  Musculoskeletal: No acute deformities  Skin: Intact, rash under pannus, long unkempt toenails  Extremities: 1+ edema RLE/LLE , RUE art line in place . PAS in place     Telemetry: Sinus rhythm  Labs: Basic Metabolic Panel:  Lab 10/29/11 0865 10/28/11 0523 10/27/11 0300 10/26/11 0435 10/25/11 0430 10/24/11 0500 10/22/11 1841  NA 149* 148* 145 144 143 -- --  K 2.9* 3.6 4.4 3.7 3.5 -- --  CL 100 100 105 100 98 -- --  CO2 42* 37* 34* 34* 35* -- --  GLUCOSE 93 224* 153* 189* 204* -- --  BUN 70* 63* 60* 57* 52* -- --  CREATININE 1.60* 1.15* 1.71* 1.84* 1.84* -- --  CALCIUM 9.1 9.3 9.2 -- -- -- --  MG -- -- -- -- 1.9 1.9 1.8  PHOS -- -- -- -- 4.0 4.2 --    Liver Function Tests: No results found for this basename: AST:5,ALT:5,ALKPHOS:5,BILITOT:5,PROT:5,ALBUMIN:5 in the last 168 hours No results found for this basename: LIPASE:5,AMYLASE:5 in the last 168 hours No results found for this basename: AMMONIA:3 in the last 168 hours  CBC:  Lab 10/29/11 0420 10/28/11 0523 10/27/11 0300  10/26/11 0435 10/25/11 0430  WBC 9.4 7.7 6.6 7.3 7.5  NEUTROABS -- -- 5.9 -- --  HGB 10.6* 10.4* 9.5* 9.5* 9.1*  HCT 34.6* 33.2* 31.4* 30.9* 29.3*  MCV 90.8 91.0 90.5 92.0 92.7  PLT 215 205 211 206 237    Cardiac Enzymes: No results found for this basename: CKTOTAL:5,CKMB:5,CKMBINDEX:5,TROPONINI:5 in the last 168 hours  BNP: BNP (last 3 results)  Basename 10/21/11 0455 10/19/11 1453 06/26/11 0526  PROBNP 13824.0* 9736.0* 7258.0*     Other results:     Imaging: Dg Chest Port 1 View  10/28/2011  *RADIOLOGY REPORT*  Clinical Data: Pulmonary edema, endotracheal tube  PORTABLE CHEST - 1 VIEW  Comparison: 10/27/2011; 10/26/2011  Findings:  Grossly unchanged cardiac silhouette and  mediastinal contours. Stable position of support apparatus including right jugular approach pulmonary arterial catheter overlying the right intralobar pulmonary artery.  Improved aeration of the lungs.  Persistent perihilar heterogeneous opacities.  No new focal airspace opacity. No definite pleural effusion or pneumothorax.  Unchanged bones.  IMPRESSION: 1.  Stable positioning of support apparatus.  No pneumothorax. 2.  Improved pulmonary edema. 3.  Persistent perihilar opacities favored to represent atelectasis.   Original Report Authenticated By: Waynard Reeds, M.D.    Dg Chest Port 1 View  10/27/2011  *RADIOLOGY REPORT*  Clinical Data: Endotracheal tube placement.  Respiratory failure  PORTABLE CHEST - 1 VIEW  Comparison: 10/27/2011  Findings: Endotracheal tube remains in good position.  Swan-Ganz catheter tip in the right lower pulmonary artery is unchanged. Left jugular catheter tip in the SVC is unchanged.  NG tube extends into the stomach.  Bibasilar airspace disease shows mild improvement.  This may be due to edema or pneumonia.  IMPRESSION: Support lines remain in good position.  No pneumothorax  Improved aeration in the lung bases.  Bibasilar airspace disease may be edema or pneumonia.   Original Report Authenticated By: Camelia Phenes, M.D.      Medications:     Scheduled Medications:    . albuterol-ipratropium  4 puff Inhalation Q8H  . antiseptic oral rinse  15 mL Mouth Rinse QID  . chlorhexidine  15 mL Mouth Rinse BID  . Chlorhexidine Gluconate Cloth  6 each Topical Q0600  . citalopram  20 mg Oral Daily  . feeding supplement (JEVITY 1.2 CAL)  1,000 mL Per Tube Q24H  . feeding supplement  60 mL Per Tube TID  . fentaNYL  100 mcg Intravenous Once  . ferrous sulfate  300 mg Per Tube Q breakfast  . free water  200 mL Per Tube Q8H  . furosemide  80 mg Intravenous BID  . insulin aspart  0-15 Units Subcutaneous Q4H  . insulin glargine  5 Units Subcutaneous Daily  .  methylPREDNISolone (SOLU-MEDROL) injection  80 mg Intravenous Q6H  . midazolam  1 mg Intravenous Q6H  . midazolam  2 mg Intravenous Once  . multivitamin  5 mL Per Tube Daily  . mupirocin ointment   Nasal BID  . pantoprazole sodium  40 mg Per Tube Daily  . potassium chloride  10 mEq Intravenous Q1 Hr x 2  . potassium chloride  40 mEq Oral Once  . DISCONTD: albuterol-ipratropium  4 puff Inhalation Q6H  . DISCONTD: insulin aspart  2-6 Units Subcutaneous Q4H  . DISCONTD: pantoprazole (PROTONIX) IV  40 mg Intravenous QHS  . DISCONTD: potassium chloride  40 mEq Per Tube Once  . DISCONTD: potassium chloride  10 mEq Intravenous Q1 Hr x 4  Infusions:    . sodium chloride 20 mL/hr at 10/28/11 2126  . amiodarone (NEXTERONE PREMIX) 360 mg/200 mL dextrose 30 mg/hr (10/28/11 2126)  . heparin 1,800 Units/hr (10/28/11 2303)  . nesiritide (NATRECOR) infusion 0.01 mcg/kg/min (10/29/11 0400)    PRN Medications: sodium chloride, albuterol, ALPRAZolam, fentaNYL, midazolam   Assessment:  1. A/C respiratory failure  2. A/c systolic HF EF 20-25%, nonischemic  3. O2 dependent COPD  4. Chronic renal failure  5. DM2  6. Morbid obesity  7. Chest pain with negative troponins: LHC on 10/15 with minimal coronary disease.  8. Anemia 9. A/C renal failure 10. AF with RVR -> NSR on amio -> AF->NSR on amio gtt   Plan/Discussion:    1. Atrial fibrillation: Patient back in NSR on amiodarone gtt.  Continue gtt for today, transition to po tomorrow.  She is on heparin gtt.  2. CHF: Acute/chronic systolic, EF 20-25%.  Nonischemic cardiomyopathy.  She diuresed well again  yesterday on IV Lasix/nesiritide.  Creatinine is near baseline but BUN is higher.  - Continue nesiritide for afterload reduction today.  - Would continue current Lasix dosing today especially as she may be extubated, may cut back tomorrow if BUN continues to rise.  3. Respiratory failure: There certainly is a component of CHF but also severe  COPD and probable OHS.  She is on steroids and has finished antibiotics course.  Overall poor prognosis given clinical course so far.  Possible extubation today.  4. CKD: Creatinine at baseline but BUN rising.  Will continue Lasix for today as above given possible extubation.  May need to cut back tomorrow.   Marca Ancona 10/29/2011 7:35 AM

## 2011-10-29 NOTE — Progress Notes (Signed)
I agree with plan

## 2011-10-29 NOTE — Progress Notes (Signed)
Clinical Social Work  CSW met with patient and son at bedside due to referral to notarize paperwork. Patient wrote a letter asking for son to take care of affairs. CSW verified that patient was alert and oriented through patient writing information on paper. CSW explained that CSW cannot confirm that letter will be accepted and just explained that notary proves that patient signed letter in front of CSW. Son asked patient to sign papers regarding insurance papers. Patient did not want to sign insurance paperwork.  [place], [place] [phone]

## 2011-10-29 NOTE — Progress Notes (Signed)
CRITICAL VALUE ALERT  Critical value received:  CO2 45  Date of notification: 10/29/11  Time of notification:  1900  Critical value read back: yes  Nurse who received alert: Burnard Bunting, RN  MD notified (1st page): Dr Marin Shutter at [facility]  Time of first page:  1911

## 2011-10-29 NOTE — Progress Notes (Signed)
Name: [name] MRN: [id number] DOB: 1945-11-23    LOS: 10  Referring Provider:  EDP -Patria Mane Reason for Referral:  Acute Respiratory Failure   PULMONARY / CRITICAL CARE MEDICINE 66 yo female with h/o COPD (4L at home), DM2, systolic CHF (EF: 40-45%), morbid obesity, Chronic Renal Insufficiency (Cr baseline:1.5) who presented in acute respiratory failure and was intubated.   Events Since Admission: 10/8 - Admit with chest pain, respiratory failure, AMS. Intubated 10/8 10/09: episode of a-fib with chest discomfort. Started on amiodarone on 10/10 10/10: extubated  10/11: reintubated 10/13: extubated 10:14: reintubated 10:15: cardiac cath:  10/17- NCB established, continue lasix Subjective:  Neg 3.5 liters again  Vital Signs: Temp:  [97.7 F (36.5 C)-98.4 F (36.9 C)] 98 F (36.7 C) (10/18 0600) Pulse Rate:  [36-105] 46  (10/18 0715) Resp:  [13-21] 20  (10/18 0715) BP: (97-168)/(23-123) 140/48 mmHg (10/18 0715) SpO2:  [40 %-99 %] 98 % (10/18 0725) FiO2 (%):  [40 %] 40 % (10/18 0725) Weight:  [251 lb 8.7 oz (114.1 kg)] 251 lb 8.7 oz (114.1 kg) (10/18 0500)  Intake/Output Summary (Last 24 hours) at 10/29/11 0800 Last data filed at 10/29/11 0600  Gross per 24 hour  Intake 2055.47 ml  Output   5460 ml  Net -3404.53 ml   Physical Examination: General:  Morbidly obese on vent Neuro:  Alert and oriented x4 HEENT:  Mm pink/moist Cardiovascular:  S1s2, regular rhythm Lungs:  Coarse breath sounds, no wheezing Abdomen:  Obese/soft, non tender Musculoskeletal:  No acute deformities Skin:  Intact, rash under pannus   Active Problems:  Pulmonary edema  Hypoxemia  COPD (chronic obstructive pulmonary disease)  Atrial fibrillation  Acute on chronic diastolic heart failure  Hypokalemia   ASSESSMENT AND PLAN  PULMONARY  Lab 10/23/11 0549 10/22/11 0932  PHART -- 7.313*  PCO2ART -- 63.4*  PO2ART -- 82.9  HCO3 -- 31.2*  O2SAT 59.2 94.3   Ventilator Settings: Vent  Mode:  [-] PSV;CPAP FiO2 (%):  [40 %] 40 % Set Rate:  [20 bmp] 20 bmp Vt Set:  [500 mL] 500 mL PEEP:  [5 cmH20] 5 cmH20 Pressure Support:  [5 cmH20-12 cmH20] 5 cmH20 Plateau Pressure:  [24 cmH20] 24 cmH20  10/18: improving pulmonary edema bases ETT:  10/8>>>10/10 Reintubation: 10/11>>>10/13 Extubation: 10/13 Re-intubation 10/14 A:   Acute Hypercarbic Respiratory Failure, now recurrent failure 10/11 likely chf related OSA/OHS + anxiety, ? Contribution of bronchitis/HCAP + COPD COPD hoe O2 - on spiriva, symbicort, BD's at home.  rx with steroids 10/12 for bronchospasm  CHF systolic and diastolic (EF:40-45%)>>now EF: 13-08% exacerbation likely contributed to initial respiratory failure P:   - negative 3.5L, overall 9L in hospitalization.  - tolerating wean on PS5, PEEP:5, FiO240%. Consider extubation tomorrow with further diuresis. Likely with crt bump meeting maximize efforts - bronchospasm: nebs, avoid beta blockers.  -pcxr in am, improved today - discussion with family and patient: no re-intubation once extubated.  Maximize negative balance and afterload reduction and niseritide Would extubate on nisiritide Limit steroids to q8h, reduce dose  CARDIOVASCULAR No results found for this basename: TROPONINI:5,LATICACIDVEN:5, O2SATVEN:5,PROBNP:5 in the last 168 hours ECG:  SR on monitor.  EKG with PVC's, no acute ST changes 10/9: PVC's, sinus tach, no ST changes 10/10: new onset atrial fibrillation  Lines:  10/8 L IJ TLC>>> 10/11 Right IJ Theone Murdoch >>>  Echo: 10/09: Study Conclusions  - Left ventricle: The cavity size was moderately dilated. Wall thickness was normal. Systolic function was mildly  to moderately reduced. The estimated ejection fraction was in the range of 40% to 45%. Due to first degree atrioventricular block, there was fusion of early and atrial contributions to ventricular filling. - Left atrium: The atrium was mildly dilated. - Pulmonary arteries: PA peak  pressure: 31mm Hg (S).  PA-c: 10/11>>>10/17 A:  A-fib- currently on gtt. Hx of HTN - on norvasc, lasix at home Systolic/diastolic CHF: EF:40-45%. Respiratory decompensation could be associated with increased RLV stiffness. - cath non obstructing CAD with increased LVEDP Repeat Echo: 10/16: 20-25% EF with global hypokinesis-non ischemic cardiomyopathy P:   - PA-c removed 10/17 - continue lasix IV 80 bid per cards as well as nesiritide. Monitor cr in setting of lasix.  - Continue amiodarone gtt for today: to be transitioned to po tomorrow. Heparin 10/10, probably xarelto once stabilized - Continue ASA Avoid all beta blocker with bronchospasm tsh low: T3, T4 pending Keep nisiritide unless drop BP  RENAL  Lab 10/29/11 0420 10/28/11 0523 10/27/11 0300 10/26/11 0435 10/25/11 0430 10/24/11 0500 10/22/11 1841  NA 149* 148* 145 144 143 -- --  K 2.9* 3.6 -- -- -- -- --  CL 100 100 105 100 98 -- --  CO2 42* 37* 34* 34* 35* -- --  BUN 70* 63* 60* 57* 52* -- --  CREATININE 1.60* 1.15* 1.71* 1.84* 1.84* -- --  CALCIUM 9.1 9.3 9.2 9.1 8.9 -- --  MG -- -- -- -- 1.9 1.9 1.8  PHOS -- -- -- -- 4.0 4.2 --   Intake/Output      10/17 0701 - 10/18 0700 10/18 0701 - 10/19 0700   I.V. (mL/kg) 1564.9 (13.7)    NG/GT 340    IV Piggyback 224    Total Intake(mL/kg) 2128.9 (18.7)    Urine (mL/kg/hr) 5660 (2.1)    Total Output 5660    Net -3531.1          Foley:  10/8 >>>  A:   Acute Renal Insufficiency on chronic renal insufficiency: baseline Cr: 1.5 -on chronic lasix at home crt eror 1.15 on 10/17 likely  P:   - creatinine worst since yesterday but back to baseline.  - continue lasix, add small amount free water as we continue lasix, consider reduction lasix dose, per cards - hypokalemia: K per tube given - BMP in am  GASTROINTESTINAL No results found for this basename: AST:5,ALT:5,ALKPHOS:5,BILITOT:5,PROT:5,ALBUMIN:5 in the last 168 hours A:   Morbid Obesity Mild Elevation of  LFT's - normalizing  P:   TF restarted ppi bm noted  HEMATOLOGIC  Lab 10/29/11 0420 10/28/11 0523 10/27/11 0300 10/26/11 0500 10/26/11 0435 10/25/11 0430  HGB 10.6* 10.4* 9.5* -- 9.5* 9.1*  HCT 34.6* 33.2* 31.4* -- 30.9* 29.3*  PLT 215 205 211 -- 206 237  INR -- -- -- 1.19 -- --  APTT -- -- -- -- -- --   A:   No acute issues.  Hx of Anemia (Hg:10.5) stable  At risk 25% copd have PE acute P:  -monitor cbc in the setting of heparin -heparin drip -doppler legs - neg, no role CT chest  INFECTIOUS  Lab 10/29/11 0420 10/28/11 0523 10/27/11 0300 10/26/11 0435 10/25/11 0430  WBC 9.4 7.7 6.6 7.3 7.5  PROCALCITON -- -- -- -- --   Cultures: 10/8 BCx2>>>no growth to date 10/8 Sputum>>> not collected 10/8 UA>>> negative 10/8 urine strep>>> not collected 10/8 urine legionella >>> not collected Antibiotics: Zosyn (?HCAP) 10/8>>> 10/15 Vanco (?HCAP) 10/8>>> 10/10 Diflucan (yeast under pannus)  10/8>>>10/10  A:   Bilateral airspace disease -likely edema but difficult to interperet due to body habitus Empiric vanc and zosyn for HCAP on 10/10. Vanc d/ced 10/10. Zosyn d/ced 10/15 P:   No evidence infection  ENDOCRINE  Lab 10/29/11 0328 10/28/11 1244 10/28/11 0845 10/28/11 0425 10/27/11 2342  GLUCAP 86 221* 205* 210* 216*   A:   DM - on 70/30 at home 30 units BID    A1C: 7.3 P:   -ICU SSI Low temp prior, TSH: 0.236: low. T3, T4 pending low glu 86 on lantus, hold increase   NEUROLOGIC 10/8 head CT>>> no acute process A:   AMS - likely related to hypercarbia. Now back to baseline Hx anxiety / depression - on xanax, celexa at home P:   Continue fentanyl and alprazolam, midazolam prn Prn versed Add around the clock ativan Anxiety, TSH: see endocrine On home citalopram Chair postion  BEST PRACTICE / DISPOSITION Level of Care:  ICU Primary Service:  PCCM Consultants:  cardiology Code Status:  DNR/DNI, no intubation or trach wished Diet: TF DVT Px:  heparin gtt GI  Px:  Protonix Skin Integrity:  Rash as above Social / Family: family updated on 10/17  Ccm time 30 min   Mcarthur Rossetti. Tyson Alias, MD, FACP Pgr: [phone] [facility]

## 2011-10-30 ENCOUNTER — Inpatient Hospital Stay (HOSPITAL_COMMUNITY): Payer: Medicare Other

## 2011-10-30 LAB — GLUCOSE, CAPILLARY
Glucose-Capillary: 115 mg/dL — ABNORMAL HIGH (ref 70–99)
Glucose-Capillary: 339 mg/dL — ABNORMAL HIGH (ref 70–99)
Glucose-Capillary: 71 mg/dL (ref 70–99)

## 2011-10-30 LAB — BASIC METABOLIC PANEL
BUN: 78 mg/dL — ABNORMAL HIGH (ref 6–23)
BUN: 77 mg/dL — ABNORMAL HIGH (ref 6–23)
CO2: 44 mEq/L (ref 19–32)
Calcium: 9.4 mg/dL (ref 8.4–10.5)
Chloride: 101 mEq/L (ref 96–112)
Creatinine, Ser: 1.77 mg/dL — ABNORMAL HIGH (ref 0.50–1.10)
GFR calc Af Amer: 33 mL/min — ABNORMAL LOW (ref >90)
GFR calc non Af Amer: 30 mL/min — ABNORMAL LOW (ref >90)
GFR calc non Af Amer: 29 mL/min — ABNORMAL LOW (ref >90)
Glucose, Bld: 118 mg/dL — ABNORMAL HIGH (ref 70–99)
Glucose, Bld: 206 mg/dL — ABNORMAL HIGH (ref 70–99)
Potassium: 2.7 mEq/L — CL (ref 3.5–5.1)

## 2011-10-30 LAB — CBC
HCT: 36.2 % (ref 36.0–46.0)
Hemoglobin: 11.3 g/dL — ABNORMAL LOW (ref 12.0–15.0)
MCHC: 31.2 g/dL (ref 30.0–36.0)
RBC: 3.86 MIL/uL — ABNORMAL LOW (ref 3.87–5.11)

## 2011-10-30 LAB — POCT I-STAT 3, ART BLOOD GAS (G3+)
Acid-Base Excess: 22 mmol/L — ABNORMAL HIGH (ref 0.0–2.0)
Bicarbonate: 48.3 mEq/L — ABNORMAL HIGH (ref 20.0–24.0)
O2 Saturation: 94 %
Patient temperature: 98.1

## 2011-10-30 LAB — HEPARIN LEVEL (UNFRACTIONATED)
Heparin Unfractionated: 0.93 IU/mL — ABNORMAL HIGH (ref 0.30–0.70)
Heparin Unfractionated: 0.4 IU/mL (ref 0.30–0.70)

## 2011-10-30 MED ORDER — PANTOPRAZOLE SODIUM 40 MG PO PACK
40.0000 mg | PACK | Freq: Every day | ORAL | Status: DC
  Administered 2011-10-31: 40 mg via ORAL
  Filled 2011-10-30 (×2): qty 20

## 2011-10-30 MED ORDER — INSULIN ASPART 100 UNIT/ML ~~LOC~~ SOLN: 0.0000 [IU] | Freq: Three times a day (TID) | SUBCUTANEOUS | Status: DC

## 2011-10-30 MED ORDER — METHYLPREDNISOLONE SODIUM SUCC 40 MG IJ SOLR
40.0000 mg | Freq: Two times a day (BID) | INTRAMUSCULAR | Status: DC
  Administered 2011-10-30 – 2011-11-01 (×4): 40 mg via INTRAVENOUS
  Filled 2011-10-30 (×6): qty 1

## 2011-10-30 MED ORDER — POTASSIUM CHLORIDE 20 MEQ/15ML (10%) PO LIQD
40.0000 meq | Freq: Two times a day (BID) | ORAL | Status: DC
  Administered 2011-10-30 – 2011-10-31 (×3): 40 meq via ORAL
  Filled 2011-10-30 (×4): qty 15
  Filled 2011-10-30: qty 30

## 2011-10-30 MED ORDER — FERROUS SULFATE 300 (60 FE) MG/5ML PO SYRP
300.0000 mg | ORAL_SOLUTION | Freq: Every day | ORAL | Status: DC
  Administered 2011-10-31 – 2011-11-01 (×2): 300 mg via ORAL
  Filled 2011-10-30 (×3): qty 5

## 2011-10-30 MED ORDER — ADULT MULTIVITAMIN LIQUID CH
5.0000 mL | Freq: Every day | ORAL | Status: DC
  Administered 2011-10-31: 5 mL via ORAL
  Filled 2011-10-30 (×2): qty 5

## 2011-10-30 MED ORDER — FREE WATER: 250.0000 mL | Freq: Four times a day (QID) | Status: DC

## 2011-10-30 MED ORDER — INSULIN ASPART 100 UNIT/ML ~~LOC~~ SOLN
0.0000 [IU] | Freq: Every day | SUBCUTANEOUS | Status: DC
  Administered 2011-10-30 – 2011-10-31 (×2): 4 [IU] via SUBCUTANEOUS

## 2011-10-30 MED ORDER — TORSEMIDE 20 MG PO TABS
40.0000 mg | ORAL_TABLET | Freq: Every day | ORAL | Status: DC
  Administered 2011-10-31: 40 mg via ORAL
  Filled 2011-10-30 (×2): qty 2

## 2011-10-30 MED ORDER — POTASSIUM CHLORIDE 20 MEQ/15ML (10%) PO LIQD
40.0000 meq | Freq: Once | ORAL | Status: AC
  Administered 2011-10-30: 40 meq

## 2011-10-30 MED ORDER — POTASSIUM CHLORIDE 10 MEQ/50ML IV SOLN: 10.0000 meq | INTRAVENOUS | Status: DC

## 2011-10-30 MED ORDER — ALBUTEROL SULFATE (5 MG/ML) 0.5% IN NEBU
2.5000 mg | INHALATION_SOLUTION | Freq: Four times a day (QID) | RESPIRATORY_TRACT | Status: DC
  Administered 2011-10-30 – 2011-11-01 (×9): 2.5 mg via RESPIRATORY_TRACT
  Filled 2011-10-30 (×10): qty 0.5

## 2011-10-30 MED ORDER — IPRATROPIUM BROMIDE 0.02 % IN SOLN
0.5000 mg | Freq: Four times a day (QID) | RESPIRATORY_TRACT | Status: DC
  Administered 2011-10-30 – 2011-10-31 (×4): 0.5 mg via RESPIRATORY_TRACT
  Filled 2011-10-30 (×5): qty 2.5

## 2011-10-30 MED ORDER — INSULIN ASPART 100 UNIT/ML ~~LOC~~ SOLN
0.0000 [IU] | Freq: Three times a day (TID) | SUBCUTANEOUS | Status: DC
  Administered 2011-10-31 (×2): 2 [IU] via SUBCUTANEOUS
  Administered 2011-10-31: 5 [IU] via SUBCUTANEOUS
  Administered 2011-11-01: 3 [IU] via SUBCUTANEOUS
  Administered 2011-11-01 (×2): 2 [IU] via SUBCUTANEOUS

## 2011-10-30 NOTE — Procedures (Signed)
Extubation Procedure Note  Patient Details:   Name: KIRSTIN KUGLER DOB: 1945/04/28 MRN: [id number]   Airway Documentation:     Evaluation  O2 sats: stable throughout Complications: No apparent complications Patient did tolerate procedure well. Bilateral Breath Sounds: Clear;Diminished Suctioning: Airway Yes Pt extubated per MD order.  Pt placed on 5l [place].  RT will continue to monitor.  [name], Terie Purser 10/30/2011, 11:43 AM

## 2011-10-30 NOTE — Progress Notes (Signed)
Name: EVALIN SHAWHAN MRN: [id number] DOB: August 14, 1945    LOS: 11  Referring Provider:  EDP -Patria Mane Reason for Referral:  Acute Respiratory Failure   PULMONARY / CRITICAL CARE MEDICINE 66 yo female with h/o COPD (4L at home), DM2, systolic CHF (EF: 20-25% echo: 10/16), morbid obesity, Chronic Renal Insufficiency (Cr baseline:1.5) who presented in acute respiratory failure and was intubated.   Events Since Admission: 10/8 - Admit with chest pain, respiratory failure, AMS. Intubated 10/8 10/09: episode of a-fib with chest discomfort. Started on amiodarone on 10/10 10/10: extubated  10/11: reintubated 10/13: extubated 10:14: reintubated 10:15: cardiac cath:  10/17- NCB established, continue lasix  Subjective:  Negative over last 24hrs.  Weaning on PS 5/5   Vital Signs: Temp:  [97.2 F (36.2 C)-98.4 F (36.9 C)] 98.1 F (36.7 C) (10/19 0400) Pulse Rate:  [46-105] 61  (10/19 0425) Resp:  [13-21] 20  (10/19 0425) BP: (85-161)/(30-115) 101/44 mmHg (10/19 0425) SpO2:  [40 %-99 %] 97 % (10/19 0425) FiO2 (%):  [40 %] 40 % (10/19 0425)  Intake/Output Summary (Last 24 hours) at 10/30/11 0521 Last data filed at 10/30/11 0400  Gross per 24 hour  Intake 2973.43 ml  Output   3125 ml  Net -151.57 ml   Physical Examination: General:  Morbidly obese on vent Neuro:  Alert and oriented x4 HEENT:  Mm pink/moist Cardiovascular:  S1s2, regular rhythm Lungs:  Coarse breath sounds Abdomen:  Obese/soft, non tender Musculoskeletal:  No acute deformities Skin:  Intact, rash under pannus   Active Problems:  Pulmonary edema  Hypoxemia  COPD (chronic obstructive pulmonary disease)  Atrial fibrillation  Acute on chronic diastolic heart failure  Hypokalemia   ASSESSMENT AND PLAN  PULMONARY  Lab 10/23/11 0549  PHART --  PCO2ART --  PO2ART --  HCO3 --  O2SAT 59.2   Ventilator Settings: Vent Mode:  [-] PRVC FiO2 (%):  [40 %] 40 % Set Rate:  [20 bmp] 20 bmp Vt Set:  [500  mL] 500 mL PEEP:  [5 cmH20] 5 cmH20 Pressure Support:  [5 cmH20-8 cmH20] 8 cmH20 Plateau Pressure:  [17 cmH20-22 cmH20] 22 cmH20  10/19: pulmonary edema grossly unchanged  ETT:  10/8>>>10/10 Reintubation: 10/11>>>10/13 Extubation: 10/13 Re-intubation 10/14>>10/19 A:   Acute Hypercarbic Respiratory Failure, now recurrent failure 10/11 likely chf related OSA/OHS + anxiety COPD on 4L home O2 - on spiriva, symbicort, BD's at home.  rx with steroids 10/12 for bronchospasm  CHF systolic and diastolic (EF:40-45%)>>now EF: 40-98% exacerbation likely contributed to initial respiratory failure  Pt passed SBT and WUA.  Pt successfully extubated AM 10/19  P:   - negative in last 24hrs with stable pulmonary edema. Pharmacy consulted and unable to concentrate drips.  -  Maximize negative balance and afterload reduction and niseritide - bronchospasm: start duonebs q6 - am pcxr - decrease Solumedrol from 60mg  q8 to 40mg  q12.  CARDIOVASCULAR No results found for this basename: TROPONINI:5,LATICACIDVEN:5, O2SATVEN:5,PROBNP:5 in the last 168 hours ECG:  SR on monitor.  EKG with PVC's, no acute ST changes 10/9: PVC's, sinus tach, no ST changes 10/10: new onset atrial fibrillation  Lines:  10/8 L IJ TLC>>> PAC  10/11>>10/17  Echo: 10/09: Study Conclusions  - Left ventricle: The cavity size was moderately dilated. Wall thickness was normal. Systolic function was mildly to moderately reduced. The estimated ejection fraction was in the range of 40% to 45%. Due to first degree atrioventricular block, there was fusion of early and atrial contributions to ventricular  filling. - Left atrium: The atrium was mildly dilated. - Pulmonary arteries: PA peak pressure: 31mm Hg (S).  A:  A-fib- currently on gtt. Hx of HTN - on norvasc, lasix at home Systolic/diastolic CHF: EF:40-45%. Respiratory decompensation could be associated with increased RLV stiffness. - cath non obstructing CAD with  increased LVEDP Repeat Echo: 10/16: 20-25% EF with global hypokinesis-non ischemic cardiomyopathy  P:   - Keep nisiritide unless BP drops - consider transitioning amiodarone drip to po today, pending cardiology recs. Heparin 10/10, probably xarelto once stabilized - Continue ASA - Avoid all beta blocker with bronchospasm  RENAL  Lab 10/30/11 0410 10/29/11 1800 10/29/11 0420 10/28/11 0523 10/27/11 0300 10/25/11 0430 10/24/11 0500  NA 154* 150* 149* 148* 145 -- --  K 2.7* 3.2* -- -- -- -- --  CL 101 98 100 100 105 -- --  CO2 44* 45* 42* 37* 34* -- --  BUN 78* 71* 70* 63* 60* -- --  CREATININE 1.70* 1.54* 1.60* 1.15* 1.71* -- --  CALCIUM 9.0 9.2 9.1 9.3 9.2 -- --  MG 1.9 -- -- -- -- 1.9 1.9  PHOS -- -- -- -- -- 4.0 4.2   Intake/Output      10/18 0701 - 10/19 0700   I.V. (mL/kg) 1315.8 (11.5)   NG/GT 1475   Total Intake(mL/kg) 2790.8 (24.5)   Urine (mL/kg/hr) 3125 (1.1)   Total Output 3125   Net -334.2       Stool Occurrence 3 x    Foley:  10/8 >>>  A:   Acute Renal Insufficiency on chronic renal insufficiency: baseline Cr: 1.5 -on chronic lasix at home crt eror 1.15 on 10/17 likely Hypernatremia: in the context of lasix  P:   - creatinine worsening today - hypokalemia: K per tube given - BMP in pm  GASTROINTESTINAL No results found for this basename: AST:5,ALT:5,ALKPHOS:5,BILITOT:5,PROT:5,ALBUMIN:5 in the last 168 hours A:   Morbid Obesity Mild Elevation of LFT's - normalizing  P:   ppi CMP tomorrow am  HEMATOLOGIC  Lab 10/30/11 0410 10/29/11 0420 10/28/11 0523 10/27/11 0300 10/26/11 0500 10/26/11 0435  HGB 11.3* 10.6* 10.4* 9.5* -- 9.5*  HCT 36.2 34.6* 33.2* 31.4* -- 30.9*  PLT 198 215 205 211 -- 206  INR -- -- -- -- 1.19 --  APTT -- -- -- -- -- --   A:   No acute issues.  Hx of Anemia (Hg:10.5) stable  At risk 25% copd have PE acute P:  -monitor cbc in the setting of heparin -heparin drip -doppler legs - neg, no role CT  chest  INFECTIOUS  Lab 10/30/11 0410 10/29/11 0420 10/28/11 0523 10/27/11 0300 10/26/11 0435  WBC 10.8* 9.4 7.7 6.6 7.3  PROCALCITON -- -- -- -- --   Cultures: 10/8 BCx2>>>no growth to date 10/8 Sputum>>> not collected 10/8 UA>>> negative 10/8 urine strep>>> not collected 10/8 urine legionella >>> not collected  Antibiotics: Zosyn (?HCAP) 10/8>>> 10/15 Vanco (?HCAP) 10/8>>> 10/10 Diflucan (yeast under pannus) 10/8>>>10/10  A:   Bilateral airspace disease -likely edema but difficult to interperet due to body habitus Empiric vanc and zosyn for HCAP on 10/10. Vanc d/ced 10/10. Zosyn d/ced 10/15 P:   Continue to monitor WBC and temp.  ENDOCRINE  Lab 10/30/11 0333 10/29/11 2342 10/29/11 1914 10/29/11 1642 10/29/11 1201  GLUCAP 115* 71 286* 270* 274*   A:   DM - on 70/30 at home 30 units BID    A1C: 7.3 P:   -ICU SSI, hold lantus with  extubation Low glucose 71 after novolog. Continue lasix 5u and ISS and monitor CBG's Low temp prior, TSH: 0.236: low. Low T3 and normal T4. Unclear picture. Repeat after acute hospitalization.   NEUROLOGIC 10/8 head CT>>> no acute process A:   AMS - likely related to hypercarbia. Now back to baseline Hx anxiety / depression - on xanax, celexa at home P:   Continue scheduled ativan 2mg  q8 On home citalopram Chair postion    BEST PRACTICE / DISPOSITION Level of Care:  ICU Primary Service:  PCCM Consultants:  cardiology Code Status:  DNR/DNI, no intubation or trach wished Diet: TF DVT Px:  heparin gtt GI Px:  Protonix Skin Integrity:  Rash as above Social / Family: family updated on 10/19  Ccm time 30 min   [name], PGY-2 [facility] ICU Rotation   CC40 min Pt seen and examined with Automotive engineer and I agree with the above note with edits  Caryl Bis  [phone]  Cell  [phone]  If no response or cell goes to voicemail, call beeper [phone]

## 2011-10-30 NOTE — Progress Notes (Signed)
Patient ID: [name], female   DOB: 1945-06-14, 66 y.o.   MRN: [id number] Advanced Heart Failure Rounding Note   Subjective:    [name] is a 66 y/o woman with multiple medical problems including morbid obesity, COPD on 4L home O2, systolic CHF with EF 40-45% DM2, HTN, CRI (baseline ~1.50) and HL. She has had multiple admissions for respiratory failure.   Came to ER 10/8 for two days of CP and progressive dyspnea. Found to be in respiratory failure. ABG 7.1/120/163/98% on facemask. Intubated in ER for PNA +/- HF (Baseline weight 273)  Developed AF -> amio. Re-intubated 10/11 for recurrent resp failure.  Extubated 10/13.  Re-intubated 10/14.  Cath 10/26/11: Minimal nonobstructive CAD. She is back in NSR on amiodarone gtt.      Weight down 25 pounds with IV lasix and nesiritde. Sodium now 154 though. Cr up to 1.7 but this is baseline. Being extubated this am.  Echo done 10/16 to try to evaluate the RV did not show the RV well but EF was 20-25% with global hypokinesis. (RV not terrible. Probably moderately dilated and mildly HK)  Objective:   Weight Range:  Vital Signs:   Temp:  [97.2 F (36.2 C)-98.4 F (36.9 C)] 98.1 F (36.7 C) (10/19 0900) Pulse Rate:  [51-105] 77  (10/19 0900) Resp:  [13-21] 19  (10/19 0900) BP: (85-161)/(30-115) 133/42 mmHg (10/19 0900) SpO2:  [95 %-99 %] 96 % (10/19 0900) FiO2 (%):  [40 %] 40 % (10/19 0900) Weight:  [112.9 kg (248 lb 14.4 oz)] 112.9 kg (248 lb 14.4 oz) (10/19 0500) Last BM Date: 10/29/11  Weight change: Filed Weights   10/28/11 0500 10/29/11 0500 10/30/11 0500  Weight: 115.6 kg (254 lb 13.6 oz) 114.1 kg (251 lb 8.7 oz) 112.9 kg (248 lb 14.4 oz)    Intake/Output:   Intake/Output Summary (Last 24 hours) at 10/30/11 1016 Last data filed at 10/30/11 0900  Gross per 24 hour  Intake 2639.43 ml  Output   3200 ml  Net -560.57 ml     Physical Exam: General: Morbidly obese. Neuro: Awake, alert on vent Follows commands HEENT: Normal    Cardiovascular: PMI nonpalpable. Distant HS s1s2 Regular Lungs:clear anteriorly Abdomen: Obese/soft,  Nontender. Good BS  Musculoskeletal: No acute deformities  Skin: Intact, rash under pannus, long unkempt toenails  Extremities: No edema ,    Telemetry: Sinus rhythm  Labs: Basic Metabolic Panel:  Lab 10/30/11 0865 10/29/11 1800 10/29/11 0420 10/28/11 0523 10/27/11 0300 10/25/11 0430 10/24/11 0500  NA 154* 150* 149* 148* 145 -- --  K 2.7* 3.2* 2.9* 3.6 4.4 -- --  CL 101 98 100 100 105 -- --  CO2 44* 45* 42* 37* 34* -- --  GLUCOSE 118* 283* 93 224* 153* -- --  BUN 78* 71* 70* 63* 60* -- --  CREATININE 1.70* 1.54* 1.60* 1.15* 1.71* -- --  CALCIUM 9.0 9.2 9.1 -- -- -- --  MG 1.9 -- -- -- -- 1.9 1.9  PHOS -- -- -- -- -- 4.0 4.2    Liver Function Tests: No results found for this basename: AST:5,ALT:5,ALKPHOS:5,BILITOT:5,PROT:5,ALBUMIN:5 in the last 168 hours No results found for this basename: LIPASE:5,AMYLASE:5 in the last 168 hours No results found for this basename: AMMONIA:3 in the last 168 hours  CBC:  Lab 10/30/11 0410 10/29/11 0420 10/28/11 0523 10/27/11 0300 10/26/11 0435  WBC 10.8* 9.4 7.7 6.6 7.3  NEUTROABS -- -- -- 5.9 --  HGB 11.3* 10.6* 10.4* 9.5* 9.5*  HCT 36.2 34.6* 33.2* 31.4* 30.9*  MCV 93.8 90.8 91.0 90.5 92.0  PLT 198 215 205 211 206    Cardiac Enzymes: No results found for this basename: CKTOTAL:5,CKMB:5,CKMBINDEX:5,TROPONINI:5 in the last 168 hours  BNP: BNP (last 3 results)  Basename 10/21/11 0455 10/19/11 1453 06/26/11 0526  PROBNP 13824.0* 9736.0* 7258.0*     Other results:     Imaging: Dg Chest Port 1 View  10/30/2011  *RADIOLOGY REPORT*  Clinical Data: Evaluate for pulmonary edema  PORTABLE CHEST - 1 VIEW  Comparison: 10/29/2011; 10/28/2011; 10/27/2011  Findings: Grossly unchanged enlarged cardiac silhouette and mediastinal contours.  Stable position of support apparatus.  The pulmonary vasculature remains distinct with cephalization  of flow. Grossly unchanged perihilar and basilar opacities.  No definite pleural effusion, though note, the right costophrenic angle is excluded from view.  No pneumothorax.  Unchanged bones.  IMPRESSION: 1.  Stable positioning of support apparatus.  No pneumothorax. 2.  Grossly unchanged findings of pulmonary edema and bibasilar opacities, possibly atelectasis   Original Report Authenticated By: Waynard Reeds, M.D.    Dg Chest Port 1 View  10/29/2011  *RADIOLOGY REPORT*  Clinical Data: Pulmonary edema  PORTABLE CHEST - 1 VIEW  Comparison: 10/28/2011  Findings: Endotracheal tube is appropriately positioned.  Insert NG tube left IJ approach central line terminates over the proximal SVC.  Right IJ Swan-Ganz catheter has been removed.  Stable moderate enlargement of the cardiomediastinal silhouette with central vascular congestion.  Hazy predominately bibasilar airspace opacities are again noted, in part due to patient body habitus but the pulmonary edema could have a similar appearance.  No pleural effusion.  Lung bases are not completely included in the field of view.  Probable left humeral bone island.  IMPRESSION: Stable bilateral hazy pulmonary opacities with cardiomegaly and vascular congestion.  This is compatible with the previous diagnosis of pulmonary edema.   Original Report Authenticated By: Harrel Lemon, M.D.      Medications:     Scheduled Medications:    . ipratropium  0.5 mg Nebulization Q6H   And  . albuterol  2.5 mg Nebulization Q6H  . antiseptic oral rinse  15 mL Mouth Rinse QID  . chlorhexidine  15 mL Mouth Rinse BID  . Chlorhexidine Gluconate Cloth  6 each Topical Q0600  . citalopram  20 mg Oral Daily  . feeding supplement (JEVITY 1.2 CAL)  1,000 mL Per Tube Q24H  . feeding supplement  60 mL Per Tube TID  . ferrous sulfate  300 mg Oral Q breakfast  . furosemide  80 mg Intravenous BID  . insulin aspart  0-15 Units Subcutaneous Q4H  . LORazepam  2 mg Intravenous Q8H    . methylPREDNISolone (SOLU-MEDROL) injection  40 mg Intravenous Q12H  . multivitamin  5 mL Oral Daily  . mupirocin ointment   Nasal BID  . pantoprazole sodium  40 mg Oral Daily  . potassium chloride  40 mEq Oral Once  . potassium chloride  40 mEq Per Tube Once  . potassium chloride  40 mEq Oral BID  . DISCONTD: albuterol-ipratropium  4 puff Inhalation Q8H  . DISCONTD: ferrous sulfate  300 mg Per Tube Q breakfast  . DISCONTD: free water  200 mL Per Tube Q8H  . DISCONTD: free water  250 mL Per Tube Q6H  . DISCONTD: free water  300 mL Per Tube Q8H  . DISCONTD: insulin glargine  5 Units Subcutaneous Daily  . DISCONTD: methylPREDNISolone (SOLU-MEDROL) injection  60 mg  Intravenous Q8H  . DISCONTD: methylPREDNISolone (SOLU-MEDROL) injection  80 mg Intravenous Q8H  . DISCONTD: midazolam  1 mg Intravenous Q6H  . DISCONTD: midazolam  2 mg Intravenous Once  . DISCONTD: multivitamin  5 mL Per Tube Daily  . DISCONTD: pantoprazole sodium  40 mg Per Tube Daily  . DISCONTD: potassium chloride  10 mEq Intravenous Q1 Hr x 4    Infusions:    . sodium chloride 20 mL/hr at 10/28/11 2126  . amiodarone (NEXTERONE PREMIX) 360 mg/200 mL dextrose 30 mg/hr (10/29/11 2233)  . heparin 1,100 Units/hr (10/30/11 0900)  . nesiritide (NATRECOR) infusion 0.01 mcg/kg/min (10/30/11 0900)  . DISCONTD: heparin Stopped (10/29/11 1030)    PRN Medications: sodium chloride, albuterol, ALPRAZolam, fentaNYL, midazolam   Assessment:  1. A/C respiratory failure -- multifactorial 2. A/c systolic HF EF 20-25%, nonischemic  3. O2 dependent COPD  4. Chronic renal failure  5. DM2  6. Morbid obesity  7. Chest pain with negative troponins: LHC on 10/15 with minimal coronary disease.  8. Anemia 9. A/C renal failure 10. AF with RVR -> NSR on amio -> AF->NSR on amio gtt 11. Hypernatremia/hypokalemia  Plan/Discussion:    I think her volume status is as good as it is going to get. Will stop nesiritide and lasix today. I  suspect she will correct sodium on her own once she is extubated and has access to liquids. If Creatinine stable can start demadex 40 daily tomorrow and adjust to keep weight in the low to mid 250s.   She is clear that she does not want to be reintubated if her respiratory status declines again.   Change amio to 400 po bid.  We will follow.   Reuel Boom Bensimhon 10/30/2011 10:16 AM

## 2011-10-30 NOTE — Progress Notes (Signed)
Hyperglycemia   Started SSI

## 2011-10-30 NOTE — Progress Notes (Signed)
Hypokalemia   K repalced 

## 2011-10-30 NOTE — Plan of Care (Signed)
Problem: Phase II Progression Outcomes Goal: Time pt extubated/weaned off vent Outcome: Completed/Met Date Met:  10/30/11 10/30/2011/ 1020am

## 2011-10-30 NOTE — Progress Notes (Signed)
ANTICOAGULATION CONSULT NOTE - Follow Up Consult  Pharmacy Consult for Heparin Indication: Atrial fibrillation  Allergies  Allergen Reactions  . Pioglitazone     REACTION: edema  . Rosiglitazone Maleate Nausea And Vomiting    Patient Measurements: Height: 5\' 6"  (167.6 cm) Weight: 248 lb 14.4 oz (112.9 kg) IBW/kg (Calculated) : 59.3  Heparin Dosing Weight: 87  Vital Signs: Temp: 98 F (36.7 C) (10/19 1400) Temp src: Core (Comment) (10/19 1400) BP: 125/82 mmHg (10/19 1400) Pulse Rate: 85  (10/19 1400)  Labs:  Basename 10/30/11 1400 10/30/11 0410 10/29/11 1800 10/29/11 0420 10/28/11 0523  HGB -- 11.3* -- 10.6* --  HCT -- 36.2 -- 34.6* 33.2*  PLT -- 198 -- 215 205  APTT -- -- -- -- --  LABPROT -- -- -- -- --  INR -- -- -- -- --  HEPARINUNFRC 0.40 0.93* 0.78* -- --  CREATININE -- 1.70* 1.54* 1.60* --  CKTOTAL -- -- -- -- --  CKMB -- -- -- -- --  TROPONINI -- -- -- -- --    Estimated Creatinine Clearance: 41.5 ml/min (by C-G formula based on Cr of 1.7).   Assessment: 66 yo female with atrial fibrillation for Heparin. Level now therapeutic.   Goal of Therapy:  Heparin level 0.3-0.7 units/ml Monitor platelets by anticoagulation protocol: Yes   Plan:  Cont heparin at 1100 units/hr F/u with AM level

## 2011-10-30 NOTE — Progress Notes (Signed)
ANTICOAGULATION CONSULT NOTE - Follow Up Consult  Pharmacy Consult for Heparin Indication: Atrial fibrillation  Allergies  Allergen Reactions  . Pioglitazone     REACTION: edema  . Rosiglitazone Maleate Nausea And Vomiting    Patient Measurements: Height: 5\' 6"  (167.6 cm) Weight: 251 lb 8.7 oz (114.1 kg) IBW/kg (Calculated) : 59.3  Heparin Dosing Weight: 87  Vital Signs: Temp: 98.1 F (36.7 C) (10/19 0400) BP: 101/44 mmHg (10/19 0425) Pulse Rate: 61  (10/19 0425)  Labs:  Basename 10/30/11 0410 10/29/11 1800 10/29/11 0920 10/29/11 0420 10/28/11 0523  HGB 11.3* -- -- 10.6* --  HCT 36.2 -- -- 34.6* 33.2*  PLT 198 -- -- 215 205  APTT -- -- -- -- --  LABPROT -- -- -- -- --  INR -- -- -- -- --  HEPARINUNFRC 0.93* 0.78* 1.21* -- --  CREATININE 1.70* 1.54* -- 1.60* --  CKTOTAL -- -- -- -- --  CKMB -- -- -- -- --  TROPONINI -- -- -- -- --    Estimated Creatinine Clearance: 41.7 ml/min (by C-G formula based on Cr of 1.7).   Assessment: 66 yo female with atrial fibrillation for Heparin.   Goal of Therapy:  Heparin level 0.3-0.7 units/ml Monitor platelets by anticoagulation protocol: Yes   Plan:  Hold Heparin x 45 min, then decrease Heparin 1100 units/hr Check heparin level in 8 hours.  Geannie Risen, PharmD, BCPS

## 2011-10-31 ENCOUNTER — Inpatient Hospital Stay (HOSPITAL_COMMUNITY): Payer: Medicare Other

## 2011-10-31 LAB — CBC
HCT: 35.7 % — ABNORMAL LOW (ref 36.0–46.0)
Hemoglobin: 10.9 g/dL — ABNORMAL LOW (ref 12.0–15.0)
MCHC: 30.5 g/dL (ref 30.0–36.0)
RBC: 3.92 MIL/uL (ref 3.87–5.11)

## 2011-10-31 LAB — TRIGLYCERIDES: Triglycerides: 215 mg/dL — ABNORMAL HIGH (ref <150)

## 2011-10-31 LAB — COMPREHENSIVE METABOLIC PANEL
ALT: 19 U/L (ref 0–35)
Alkaline Phosphatase: 43 U/L (ref 39–117)
BUN: 71 mg/dL — ABNORMAL HIGH (ref 6–23)
CO2: 43 mEq/L (ref 19–32)
GFR calc Af Amer: 39 mL/min — ABNORMAL LOW (ref >90)
GFR calc non Af Amer: 34 mL/min — ABNORMAL LOW (ref >90)
Glucose, Bld: 193 mg/dL — ABNORMAL HIGH (ref 70–99)
Potassium: 4.3 mEq/L (ref 3.5–5.1)
Total Bilirubin: 0.5 mg/dL (ref 0.3–1.2)
Total Protein: 5.9 g/dL — ABNORMAL LOW (ref 6.0–8.3)

## 2011-10-31 LAB — GLUCOSE, CAPILLARY

## 2011-10-31 LAB — HEPARIN LEVEL (UNFRACTIONATED): Heparin Unfractionated: 0.22 IU/mL — ABNORMAL LOW (ref 0.30–0.70)

## 2011-10-31 MED ORDER — TIOTROPIUM BROMIDE MONOHYDRATE 18 MCG IN CAPS
18.0000 ug | ORAL_CAPSULE | Freq: Every day | RESPIRATORY_TRACT | Status: DC
  Administered 2011-10-31 – 2011-11-01 (×2): 18 ug via RESPIRATORY_TRACT
  Filled 2011-10-31: qty 5

## 2011-10-31 MED ORDER — ALPRAZOLAM 0.5 MG PO TABS
0.5000 mg | ORAL_TABLET | Freq: Three times a day (TID) | ORAL | Status: DC
  Filled 2011-10-31: qty 1

## 2011-10-31 MED ORDER — HEPARIN (PORCINE) IN NACL 100-0.45 UNIT/ML-% IJ SOLN
1350.0000 [IU]/h | INTRAMUSCULAR | Status: DC
  Administered 2011-10-31: 1350 [IU]/h via INTRAVENOUS
  Filled 2011-10-31 (×2): qty 250

## 2011-10-31 MED ORDER — ALPRAZOLAM 0.5 MG PO TABS
0.5000 mg | ORAL_TABLET | Freq: Three times a day (TID) | ORAL | Status: DC
  Administered 2011-10-31 – 2011-11-01 (×5): 0.5 mg via ORAL
  Filled 2011-10-31 (×4): qty 1

## 2011-10-31 MED ORDER — INSULIN ASPART PROT & ASPART (70-30 MIX) 100 UNIT/ML ~~LOC~~ SUSP
20.0000 [IU] | Freq: Two times a day (BID) | SUBCUTANEOUS | Status: DC
  Administered 2011-10-31 – 2011-11-01 (×2): 20 [IU] via SUBCUTANEOUS
  Filled 2011-10-31: qty 3

## 2011-10-31 MED ORDER — AMIODARONE HCL 200 MG PO TABS
200.0000 mg | ORAL_TABLET | Freq: Two times a day (BID) | ORAL | Status: DC
  Administered 2011-10-31 – 2011-11-01 (×3): 200 mg via ORAL
  Filled 2011-10-31 (×4): qty 1

## 2011-10-31 NOTE — Progress Notes (Signed)
The patient [name] has refused to participate in ambulation today.  I have attempted multiple times to sit the patient up in bed and move her from the bed to the chair.  Routine turning has also been refused on multiple occasions.  The patient's family has been notified and expresses a desire to assist by providing reassurance and motivation.    Dan Maker 10/31/2011, 5:39 PM

## 2011-10-31 NOTE — Progress Notes (Signed)
ANTICOAGULATION CONSULT NOTE - Follow Up Consult  Pharmacy Consult for Heparin Indication: Atrial fibrillation  Allergies  Allergen Reactions  . Pioglitazone     REACTION: edema  . Rosiglitazone Maleate Nausea And Vomiting    Patient Measurements: Height: 5\' 6"  (167.6 cm) Weight: 252 lb 6.8 oz (114.5 kg) IBW/kg (Calculated) : 59.3  Heparin Dosing Weight: 87  Vital Signs: Temp: 98.8 F (37.1 C) (10/20 1200) BP: 127/60 mmHg (10/20 1200) Pulse Rate: 82  (10/20 1200)  Labs:  Basename 10/31/11 1141 10/31/11 0507 10/30/11 1400 10/30/11 0410 10/29/11 0420  HGB -- 10.9* -- 11.3* --  HCT -- 35.7* -- 36.2 34.6*  PLT -- 164 -- 198 215  APTT -- -- -- -- --  LABPROT -- -- -- -- --  INR -- -- -- -- --  HEPARINUNFRC 0.31 0.22* 0.40 -- --  CREATININE -- 1.55* 1.77* 1.70* --  CKTOTAL -- -- -- -- --  CKMB -- -- -- -- --  TROPONINI -- -- -- -- --    Estimated Creatinine Clearance: 45.9 ml/min (by C-G formula based on Cr of 1.55).   Assessment: 66 yo female with atrial fibrillation on Heparin. Heparin level therapeutic but on the lower end of goal.  Goal of Therapy:  Heparin level 0.3-0.7 units/ml Monitor platelets by anticoagulation protocol: Yes   Plan:  1. Increase IV heparin to 1350 units/hr 2. F/u with AM level

## 2011-10-31 NOTE — Progress Notes (Signed)
Name: [name] MRN: [id number] DOB: 1945-12-23    LOS: 12  Referring Provider:  EDP -Patria Mane Reason for Referral:  Acute Respiratory Failure   PULMONARY / CRITICAL CARE MEDICINE 66 yo female with h/o COPD (4L at home), DM2, systolic CHF (EF: 20-25% echo: 10/16), morbid obesity, Chronic Renal Insufficiency (Cr baseline:1.5) who presented in acute respiratory failure and was intubated.   Events Since Admission: 10/8 - Admit with chest pain, respiratory failure, AMS. Intubated 10/8 10/09: episode of a-fib with chest discomfort. Started on amiodarone on 10/10 10/10: extubated  10/11: reintubated 10/13: extubated 10:14: reintubated 10:15: cardiac cath:  10/17- NCB established, continue lasix 10/19 extubated. NO reintubtion  Subjective:  tol extubation well 10/19  Vital Signs: Temp:  [96 F (35.6 C)-98.3 F (36.8 C)] 98 F (36.7 C) (10/20 0800) Pulse Rate:  [65-99] 91  (10/20 0900) Resp:  [15-22] 17  (10/20 0900) BP: (103-147)/(23-82) 136/47 mmHg (10/20 0900) SpO2:  [93 %-99 %] 93 % (10/20 0900) FiO2 (%):  [40 %] 40 % (10/19 1000) Weight:  [114.5 kg (252 lb 6.8 oz)] 114.5 kg (252 lb 6.8 oz) (10/20 0500)  Intake/Output Summary (Last 24 hours) at 10/31/11 0915 Last data filed at 10/31/11 0900  Gross per 24 hour  Intake 1587.3 ml  Output   2380 ml  Net -792.7 ml   Physical Examination: General:  Morbidly obese on [place] Neuro:  Alert and oriented x4 HEENT:  Mm pink/moist Cardiovascular:  S1s2, regular rhythm Lungs:  Coarse breath sounds Abdomen:  Obese/soft, non tender Musculoskeletal:  No acute deformities Skin:  Intact, rash under pannus   Active Problems:  Pulmonary edema  Hypoxemia  COPD (chronic obstructive pulmonary disease)  Atrial fibrillation  Acute on chronic diastolic heart failure  Hypokalemia   ASSESSMENT AND PLAN  PULMONARY  Lab 10/30/11 0952  PHART 7.509*  PCO2ART 60.5*  PO2ART 66.0*  HCO3 48.3*  O2SAT 94.0   [place] 4L  10/20: pulmonary  edema improved ETT:  10/8>>>10/10 Reintubation: 10/11>>>10/13 Extubation: 10/13 Re-intubation 10/14>>10/19 A:   Acute Hypercarbic Respiratory Failure, OSA/OHS + anxiety COPD on 4L home O2CHF systolic and diastolic (EF:40-45%)>>now EF: 08-65%  Pt extubated 10/19 successfully, no plans to reintubate  P:   -cont BD, oxygen, diuresis per cards, spiriva, pt/ot  CARDIOVASCULAR No results found for this basename: TROPONINI:5,LATICACIDVEN:5, O2SATVEN:5,PROBNP:5 in the last 168 hours ECG:   Lines:  10/8 L IJ TLC>>> PAC  10/11>>10/17  A:  A-fib-  Hx of HTN - on norvasc, lasix at home Systolic/diastolic CHF: EF:40-45%. Respiratory decompensation could be associated with increased RLV stiffness. - cath non obstructing CAD with increased LVEDP Repeat Echo: 10/16: 20-25% EF with global hypokinesis-non ischemic cardiomyopathy  P:   - per cardiology Now off amiodarone/natrecor Now on hep drip Now on demedex   RENAL  Lab 10/31/11 0507 10/30/11 1400 10/30/11 0410 10/29/11 1800 10/29/11 0420 10/25/11 0430  NA 144 151* 154* 150* 149* --  K 4.3 4.0 -- -- -- --  CL 97 100 101 98 100 --  CO2 43* 44* 44* 45* 42* --  BUN 71* 77* 78* 71* 70* --  CREATININE 1.55* 1.77* 1.70* 1.54* 1.60* --  CALCIUM 8.8 9.4 9.0 9.2 9.1 --  MG -- -- 1.9 -- -- 1.9  PHOS -- -- -- -- -- 4.0   Intake/Output      10/19 0701 - 10/20 0700 10/20 0701 - 10/21 0700   P.O. 840    I.V. (mL/kg) 800.9 (7) 65 (0.6)   NG/GT  40    Total Intake(mL/kg) 1680.9 (14.7) 65 (0.6)   Urine (mL/kg/hr) 2550 (0.9) 130   Stool     Total Output 2550 130   Net -869.1 -65         Foley:  10/8 >>>  A:   Acute Renal Insufficiency on chronic renal insufficiency: baseline Cr: 1.5 -on chronic lasix at home  P:   - demedex -bmp daily  GASTROINTESTINAL  Lab 10/31/11 0507  AST 12  ALT 19  ALKPHOS 43  BILITOT 0.5  PROT 5.9*  ALBUMIN 2.7*   A:   Morbid Obesity  P:   ppi Advance diet  HEMATOLOGIC  Lab 10/31/11 0507  10/30/11 0410 10/29/11 0420 10/28/11 0523 10/27/11 0300 10/26/11 0500  HGB 10.9* 11.3* 10.6* 10.4* 9.5* --  HCT 35.7* 36.2 34.6* 33.2* 31.4* --  PLT 164 198 215 205 211 --  INR -- -- -- -- -- 1.19  APTT -- -- -- -- -- --   A:   No acute issues.  Hx of Anemia (Hg:10.5) stable  At risk 25% copd have PE acute P:  -monitor cbc in the setting of heparin -heparin drip -doppler legs - neg, no role CT chest  INFECTIOUS  Lab 10/31/11 0507 10/30/11 0410 10/29/11 0420 10/28/11 0523 10/27/11 0300  WBC 9.8 10.8* 9.4 7.7 6.6  PROCALCITON -- -- -- -- --   Cultures: 10/8 BCx2>>>no growth to date 10/8 Sputum>>> not collected 10/8 UA>>> negative 10/8 urine strep>>> not collected 10/8 urine legionella >>> not collected  Antibiotics: Zosyn (?HCAP) 10/8>>> 10/15 Vanco (?HCAP) 10/8>>> 10/10 Diflucan (yeast under pannus) 10/8>>>10/10  A:   Bilateral airspace disease -likely edema but difficult to interperet due to body habitus Empiric vanc and zosyn for HCAP on 10/10. Vanc d/ced 10/10. Zosyn d/ced 10/15 P:   Continue to monitor WBC and temp. Off all ABX  ENDOCRINE  Lab 10/31/11 0705 10/30/11 2343 10/30/11 2156 10/30/11 1523 10/30/11 1101  GLUCAP 194* 290* 339* 200* 198*   A:   DM - on 70/30 at home 30 units BID    A1C: 7.3 P:   -hold lantus -resume 70/30 bid 20 units Steroids an issue Low temp prior, TSH: 0.236: low. Low T3 and normal T4. Unclear picture. Repeat after acute hospitalization.   NEUROLOGIC 10/8 head CT>>> no acute process A:   AMS - likely related to hypercarbia. Now back to baseline Hx anxiety / depression - on xanax, celexa at home P:   Change to po xanax on schedule On home citalopram   BEST PRACTICE / DISPOSITION Level of Care:  ICU Primary Service:  PCCM Consultants:  cardiology Code Status:  DNR/DNI, no intubation or trach wished Diet: PO DVT Px:  heparin gtt GI Px:  Protonix Skin Integrity:  Rash as above Social / Family: family updated on  10/20 Ccm time 30 min    Caryl Bis  [phone]  Cell  [phone]  If no response or cell goes to voicemail, call beeper [phone]  10/31/2011

## 2011-10-31 NOTE — Progress Notes (Signed)
ANTICOAGULATION CONSULT NOTE - Follow Up Consult  Pharmacy Consult for Heparin Indication: Atrial fibrillation  Allergies  Allergen Reactions  . Pioglitazone     REACTION: edema  . Rosiglitazone Maleate Nausea And Vomiting    Patient Measurements: Height: 5\' 6"  (167.6 cm) Weight: 248 lb 14.4 oz (112.9 kg) IBW/kg (Calculated) : 59.3  Heparin Dosing Weight: 87  Vital Signs: Temp: 97 F (36.1 C) (10/20 0300) Temp src: Core (Comment) (10/19 1900) BP: 120/30 mmHg (10/20 0300) Pulse Rate: 72  (10/20 0300)  Labs:  Basename 10/31/11 0507 10/30/11 1400 10/30/11 0410 10/29/11 1800 10/29/11 0420  HGB 10.9* -- 11.3* -- --  HCT 35.7* -- 36.2 -- 34.6*  PLT 164 -- 198 -- 215  APTT -- -- -- -- --  LABPROT -- -- -- -- --  INR -- -- -- -- --  HEPARINUNFRC 0.22* 0.40 0.93* -- --  CREATININE -- 1.77* 1.70* 1.54* --  CKTOTAL -- -- -- -- --  CKMB -- -- -- -- --  TROPONINI -- -- -- -- --    Estimated Creatinine Clearance: 39.8 ml/min (by C-G formula based on Cr of 1.77).   Assessment: 66 yo female with atrial fibrillation on Heparin. Heparin level is now below-goal on 1100 units/hr.   Goal of Therapy:  Heparin level 0.3-0.7 units/ml Monitor platelets by anticoagulation protocol: Yes   Plan:  1. Increase IV heparin to 1250 units/hr. 2. Heparin level in 6 hours.  Lorre Munroe, PharmD 10/31/11, 05:41 AM

## 2011-10-31 NOTE — Progress Notes (Signed)
Patient ID: [name], female   DOB: [birth year], 66 y.o.   MRN: [id number] Advanced Heart Failure Rounding Note   Subjective:    [name] is a 66 y/o woman with multiple medical problems including morbid obesity, COPD on 4L home O2, systolic CHF with EF 40-45% DM2, HTN, CRI (baseline ~1.50) and HL. She has had multiple admissions for respiratory failure.   Came to ER 10/8 for two days of CP and progressive dyspnea. Found to be in respiratory failure. ABG 7.1/120/163/98% on facemask. Intubated in ER for PNA +/- HF (Baseline weight 273)  Events Since Admission:  10/8 - Admit with chest pain, respiratory failure, AMS. Intubated 10/8  10/09: episode of a-fib with chest discomfort. Started on amiodarone on 10/10  10/10: extubated  10/11: reintubated  10/13: extubated  10:14: reintubated  10:15: cardiac cath: Minimal nonobstructive CAD. 10/17- NCB established, continue lasix  10/19 extubated. NO reintubtion  Weight down 25 pounds with IV lasix and nesiritde. Extubated yesterday. Feels great. IV lasix held due to sodium of 154 now 149. Weight up 4 pounds. Maintaining NSR on po amio.   CVP checked personally = 5  Echo done 10/16 to try to evaluate the RV did not show the RV well but EF was 20-25% with global hypokinesis. (RV not terrible. Probably moderately dilated and mildly HK)  Objective:   Weight Range:  Vital Signs:   Temp:  [94.1 F (34.5 C)-98.3 F (36.8 C)] 94.1 F (34.5 C) (10/20 1000) Pulse Rate:  [65-91] 88  (10/20 1000) Resp:  [15-22] 15  (10/20 1000) BP: (103-136)/(23-82) 133/45 mmHg (10/20 1000) SpO2:  [93 %-99 %] 94 % (10/20 1000) Weight:  [114.5 kg (252 lb 6.8 oz)] 114.5 kg (252 lb 6.8 oz) (10/20 0500) Last BM Date: 10/29/11  Weight change: Filed Weights   10/29/11 0500 10/30/11 0500 10/31/11 0500  Weight: 114.1 kg (251 lb 8.7 oz) 112.9 kg (248 lb 14.4 oz) 114.5 kg (252 lb 6.8 oz)    Intake/Output:   Intake/Output Summary (Last 24 hours) at 10/31/11  1049 Last data filed at 10/31/11 1000  Gross per 24 hour  Intake 1560.5 ml  Output   1930 ml  Net -369.5 ml     Physical Exam: General: Morbidly obese. Neuro: Awake, alert HEENT: Normal  Cardiovascular: PMI nonpalpable. Distant HS s1s2 Regular Lungs:clear anteriorly Abdomen: Obese/soft,  Nontender. Good BS  Musculoskeletal: No acute deformities  Skin: Intact, rash under pannus, long unkempt toenails  Extremities: No edema ,    Telemetry: Sinus rhythm  Labs: Basic Metabolic Panel:  Lab 10/31/11 6578 10/30/11 1400 10/30/11 0410 10/29/11 1800 10/29/11 0420 10/25/11 0430  NA 144 151* 154* 150* 149* --  K 4.3 4.0 2.7* 3.2* 2.9* --  CL 97 100 101 98 100 --  CO2 43* 44* 44* 45* 42* --  GLUCOSE 193* 206* 118* 283* 93 --  BUN 71* 77* 78* 71* 70* --  CREATININE 1.55* 1.77* 1.70* 1.54* 1.60* --  CALCIUM 8.8 9.4 9.0 -- -- --  MG -- -- 1.9 -- -- 1.9  PHOS -- -- -- -- -- 4.0    Liver Function Tests:  Lab 10/31/11 0507  AST 12  ALT 19  ALKPHOS 43  BILITOT 0.5  PROT 5.9*  ALBUMIN 2.7*   No results found for this basename: LIPASE:5,AMYLASE:5 in the last 168 hours No results found for this basename: AMMONIA:3 in the last 168 hours  CBC:  Lab 10/31/11 0507 10/30/11 0410 10/29/11 0420 10/28/11 0523 10/27/11  0300  WBC 9.8 10.8* 9.4 7.7 6.6  NEUTROABS -- -- -- -- 5.9  HGB 10.9* 11.3* 10.6* 10.4* 9.5*  HCT 35.7* 36.2 34.6* 33.2* 31.4*  MCV 91.1 93.8 90.8 91.0 90.5  PLT 164 198 215 205 211    Cardiac Enzymes: No results found for this basename: CKTOTAL:5,CKMB:5,CKMBINDEX:5,TROPONINI:5 in the last 168 hours  BNP: BNP (last 3 results)  Basename 10/21/11 0455 10/19/11 1453 06/26/11 0526  PROBNP 13824.0* 9736.0* 7258.0*     Other results:     Imaging: Dg Chest Port 1 View  10/31/2011  *RADIOLOGY REPORT*  Clinical Data: Pulmonary edema, post extubation  PORTABLE CHEST - 1 VIEW  Comparison: 11/30/2011; 11/29/2011; 11/28/2011  Findings: Grossly unchanged enlarged  cardiac silhouette and mediastinal contours with prominence of the central pulmonary vasculature.  Interval extubation and removal of enteric tube. Stable positioning of left jugular approach of venous catheter with tip projected over the superior SVC.  The pulmonary vasculature remains indistinct with cephalization of flow. Perihilar and bibasilar opacities are grossly unchanged.  There is mild elevation left hemidiaphragm.  No definite pleural effusion or pneumothorax. Unchanged bones.  IMPRESSION: 1.  Interval extubation and removal of enteric tube.  No pneumothorax. 2.  Persistently reduced lung volumes with findings suggestive of pulmonary edema and perihilar/bibasilar atelectasis. 3.  Unchanged cardiomegaly and prominence of the central pulmonary vasculature, nonspecific but may be seen in the setting of pulmonary arterial hypertension.  Further evaluation with cardiac echo may performed as clinically indicated.   Original Report Authenticated By: Waynard Reeds, M.D.    Dg Chest Port 1 View  10/30/2011  *RADIOLOGY REPORT*  Clinical Data: Evaluate for pulmonary edema  PORTABLE CHEST - 1 VIEW  Comparison: 10/29/2011; 10/28/2011; 10/27/2011  Findings: Grossly unchanged enlarged cardiac silhouette and mediastinal contours.  Stable position of support apparatus.  The pulmonary vasculature remains distinct with cephalization of flow. Grossly unchanged perihilar and basilar opacities.  No definite pleural effusion, though note, the right costophrenic angle is excluded from view.  No pneumothorax.  Unchanged bones.  IMPRESSION: 1.  Stable positioning of support apparatus.  No pneumothorax. 2.  Grossly unchanged findings of pulmonary edema and bibasilar opacities, possibly atelectasis   Original Report Authenticated By: Waynard Reeds, M.D.      Medications:     Scheduled Medications:    . albuterol  2.5 mg Nebulization Q6H  . ALPRAZolam  0.5 mg Oral TID  . antiseptic oral rinse  15 mL Mouth Rinse  QID  . chlorhexidine  15 mL Mouth Rinse BID  . Chlorhexidine Gluconate Cloth  6 each Topical Q0600  . citalopram  20 mg Oral Daily  . ferrous sulfate  300 mg Oral Q breakfast  . insulin aspart  0-5 Units Subcutaneous QHS  . insulin aspart  0-9 Units Subcutaneous TID WC  . insulin aspart protamine-insulin aspart  20 Units Subcutaneous BID WC  . methylPREDNISolone (SOLU-MEDROL) injection  40 mg Intravenous Q12H  . multivitamin  5 mL Oral Daily  . mupirocin ointment   Nasal BID  . pantoprazole sodium  40 mg Oral Daily  . potassium chloride  40 mEq Oral BID  . tiotropium  18 mcg Inhalation Daily  . torsemide  40 mg Oral Daily  . DISCONTD: ALPRAZolam  0.5 mg Oral TID  . DISCONTD: feeding supplement (JEVITY 1.2 CAL)  1,000 mL Per Tube Q24H  . DISCONTD: feeding supplement  60 mL Per Tube TID  . DISCONTD: insulin aspart  0-15 Units Subcutaneous  Q4H  . DISCONTD: insulin aspart  0-15 Units Subcutaneous TID WC  . DISCONTD: ipratropium  0.5 mg Nebulization Q6H  . DISCONTD: LORazepam  2 mg Intravenous Q8H    Infusions:    . sodium chloride 20 mL/hr at 10/28/11 2126  . heparin 1,250 Units/hr (10/31/11 0600)    PRN Medications: sodium chloride, albuterol, DISCONTD: ALPRAZolam   Assessment:  1. A/C respiratory failure -- multifactorial 2. A/c systolic HF EF 20-25%, nonischemic  3. O2 dependent COPD  4. Chronic renal failure  5. DM2  6. Morbid obesity  7. Chest pain with negative troponins: LHC on 10/15 with minimal coronary disease.  8. Anemia 9. A/C renal failure 10. AF with RVR -> NSR on amio -> AF->NSR on amio gtt 11. Hypernatremia/hypokalemia  Plan/Discussion:    She looks great. Will be very important to keep CVP <= 8. She is at her dry weight now and we will work hard to maintain this. Continue demadex 40 daily today as Na still high. If CVP going up will need to be aggressive about titrating demadex.  She is clear that she does not want to be reintubated if her respiratory  status declines again.   Keep on amio 200 bid for now. Continue heparin. Will start apixaban 5 bid.   Reuel Boom Bensimhon 10/31/2011 10:49 AM

## 2011-11-01 ENCOUNTER — Inpatient Hospital Stay (HOSPITAL_COMMUNITY): Payer: Medicare Other

## 2011-11-01 LAB — CBC
HCT: 35.4 % — ABNORMAL LOW (ref 36.0–46.0)
Hemoglobin: 10.9 g/dL — ABNORMAL LOW (ref 12.0–15.0)
MCV: 89.2 fL (ref 78.0–100.0)
Platelets: 145 10*3/uL — ABNORMAL LOW (ref 150–400)
RBC: 3.97 MIL/uL (ref 3.87–5.11)
WBC: 13.7 10*3/uL — ABNORMAL HIGH (ref 4.0–10.5)

## 2011-11-01 LAB — URINALYSIS, ROUTINE W REFLEX MICROSCOPIC
Glucose, UA: NEGATIVE mg/dL
Ketones, ur: NEGATIVE mg/dL
Protein, ur: NEGATIVE mg/dL
Urobilinogen, UA: 1 mg/dL (ref 0.0–1.0)

## 2011-11-01 LAB — BASIC METABOLIC PANEL
CO2: 44 mEq/L (ref 19–32)
Calcium: 8.9 mg/dL (ref 8.4–10.5)
Chloride: 93 mEq/L — ABNORMAL LOW (ref 96–112)
Potassium: 5 mEq/L (ref 3.5–5.1)
Sodium: 140 mEq/L (ref 135–145)

## 2011-11-01 LAB — GLUCOSE, CAPILLARY
Glucose-Capillary: 171 mg/dL — ABNORMAL HIGH (ref 70–99)
Glucose-Capillary: 173 mg/dL — ABNORMAL HIGH (ref 70–99)

## 2011-11-01 LAB — HEPARIN LEVEL (UNFRACTIONATED): Heparin Unfractionated: 0.72 IU/mL — ABNORMAL HIGH (ref 0.30–0.70)

## 2011-11-01 LAB — URINE MICROSCOPIC-ADD ON

## 2011-11-01 MED ORDER — FERROUS SULFATE 325 (65 FE) MG PO TABS
325.0000 mg | ORAL_TABLET | Freq: Every day | ORAL | Status: DC
  Filled 2011-11-01: qty 1

## 2011-11-01 MED ORDER — APIXABAN 5 MG PO TABS
5.0000 mg | ORAL_TABLET | Freq: Two times a day (BID) | ORAL | Status: DC
Start: 2011-11-01 — End: 2011-11-01
  Administered 2011-11-01: 5 mg via ORAL
  Filled 2011-11-01 (×2): qty 1

## 2011-11-01 MED ORDER — PREDNISONE 20 MG PO TABS
40.0000 mg | ORAL_TABLET | Freq: Every day | ORAL | Status: DC
  Filled 2011-11-01: qty 2

## 2011-11-01 MED ORDER — ACETAZOLAMIDE ER 500 MG PO CP12: 500.0000 mg | ORAL_CAPSULE | Freq: Two times a day (BID) | ORAL | Status: DC

## 2011-11-01 MED ORDER — INSULIN ASPART PROT & ASPART (70-30 MIX) 100 UNIT/ML ~~LOC~~ SUSP: 25.0000 [IU] | Freq: Two times a day (BID) | SUBCUTANEOUS | Status: DC

## 2011-11-01 MED ORDER — ACETAZOLAMIDE 250 MG PO TABS
500.0000 mg | ORAL_TABLET | Freq: Two times a day (BID) | ORAL | Status: DC
  Filled 2011-11-01 (×2): qty 2

## 2011-11-01 MED ORDER — PREDNISONE 10 MG PO TABS: ORAL_TABLET | ORAL | Status: DC

## 2011-11-01 MED ORDER — INSULIN ASPART PROT & ASPART (70-30 MIX) 100 UNIT/ML ~~LOC~~ SUSP
25.0000 [IU] | Freq: Two times a day (BID) | SUBCUTANEOUS | Status: DC
  Administered 2011-11-01: 25 [IU] via SUBCUTANEOUS
  Filled 2011-11-01: qty 3

## 2011-11-01 MED ORDER — APIXABAN 5 MG PO TABS: 5.0000 mg | ORAL_TABLET | Freq: Two times a day (BID) | ORAL | Status: DC

## 2011-11-01 MED ORDER — ACETAZOLAMIDE ER 500 MG PO CP12
500.0000 mg | ORAL_CAPSULE | Freq: Two times a day (BID) | ORAL | Status: DC
  Administered 2011-11-01: 500 mg via ORAL
  Filled 2011-11-01 (×3): qty 1

## 2011-11-01 MED ORDER — ADULT MULTIVITAMIN W/MINERALS CH
1.0000 | ORAL_TABLET | Freq: Every day | ORAL | Status: DC
  Administered 2011-11-01: 1 via ORAL
  Filled 2011-11-01: qty 1

## 2011-11-01 MED ORDER — PANTOPRAZOLE SODIUM 40 MG PO TBEC
40.0000 mg | DELAYED_RELEASE_TABLET | Freq: Every day | ORAL | Status: DC
  Administered 2011-11-01: 40 mg via ORAL
  Filled 2011-11-01: qty 1

## 2011-11-01 MED ORDER — SODIUM CHLORIDE 0.9 % IV SOLN: 250.0000 mL | INTRAVENOUS | Status: DC | PRN

## 2011-11-01 MED ORDER — AMIODARONE HCL 200 MG PO TABS: 200.0000 mg | ORAL_TABLET | Freq: Two times a day (BID) | ORAL | Status: DC

## 2011-11-01 NOTE — Discharge Summary (Signed)
Physician Discharge Summary  Patient ID: [name] MRN: [id number] DOB/AGE: 66-Jul-1947 66 y.o.  Admit date: 10/19/2011 Discharge date: 11/01/2011    Discharge Diagnoses:  Pulmonary edema Hypoxemia COPD (chronic obstructive pulmonary disease) OSA / OHS Atrial fibrillation Acute on chronic diastolic heart failure Non-Ischemic Cardiomyopathy HTN Acute on Chronic Renal Insufficiency Hypokalemia Morbid Obesity Anemia Diabetes Mellitus Encephalopathy Anxiety Depression   Brief Summary: [name] is a 66 y.o. y/o female with a PMH of COPD (4L at home), DM2, systolic CHF (EF: 20-25% echo: 10/16), morbid obesity, Chronic Renal Insufficiency (Cr baseline:1.5) who presented in acute respiratory failure and was intubated.  Prolonged hospital course due to encephalopathy, decompensated CHF, Atrial Fibrillation, acute on chronic renal insufficiency.     Consults: [facility] - Dr. Gala Romney  Lines/tubes: ETT: 10/8>>>10/10  Reintubation: 10/11>>>10/13  Re-intubation 10/14>>10/19  L IJ TLC 10/8>>>10/21 PAC 10/11>>10/17   Microbiology/Sepsis markers: 10/8 BCx2>>>neg 10/8 Sputum>>> not collected  10/8 UA>>> negative  10/8 urine strep>>> not collected  10/8 urine legionella >>> not collected   Antibiotics:  Zosyn (?HCAP) 10/8>>> 10/15  Vanco (?HCAP) 10/8>>> 10/10  Diflucan (yeast under pannus) 10/8>>>10/10    Significant Diagnostic Studies:  10/9 ECHO>>>Left ventricle: The cavity size was moderately dilated.  Wall thickness was normal. Systolic function was mildly to moderately reduced. The estimated ejection fraction was in the range of 40% to 45%. Due to first degree atrioventricular block, there was fusion of early and atrial contributions to ventricular filling. Left atrium: The atrium was mildly dilated.  Pulmonary arteries: PA peak pressure: 31mm Hg (S).  10/16 ECHO>>>Left ventricle: The cavity size was at the upper limits of normal. Systolic function was  severely reduced. The estimated ejection fraction was in the range of 20% to 25%. Diffuse hypokinesis. Mitral valve: Mild regurgitation. Left atrium: The atrium was mildly dilated.   Events Since Admission:  10/8 - Admit with chest pain, respiratory failure, AMS. Intubated 10/8  10/09: episode of a-fib with chest discomfort. Started on amiodarone on 10/10  10/10: extubated  10/11: reintubated  10/13: extubated  10:14: reintubated  10:15: cardiac cath:  10/17- NCB established, continue lasix  10/19 - extubated. NO reintubtion                                                                    Hospital Summary by Discharge Diagnosis  Pulmonary edema Hypoxemia COPD (chronic obstructive pulmonary disease) OSA / OHS Patient with a known history of COPD on 4L O2 at baseline. presented to [facility] on 10/8 after calling EMS for a two day history of increasing shortness of breath & chest pain.  She failed non-invasive mechanical ventilation and required intubation on day of admit.  Initially, she was empirically treated for HCAP in the setting of bilateral infiltrates thought related to pulmonary edema but could not exclude pneumonia.  As coarse progressed, antibiotics were quickly narrowed as it was felt airspace disease was related to pulmonary edema.  Patient met criteria for extubation on several occasions but failed requiring reintubation.  Given the nature of her chronic critical illness, continued discussions with family were had regarding code status.  She was made a full NCB on 10/17.  She was extubated on 10/19 with no plan for reintubation.  Discharge Plan: -prednisone taper to off -continue symbicort, spiriva, Bronchodilators -aggressive pulmonary hygiene -DNR  / DNI  -mucinex  -oxygen at minimum of 4L with increase with activity -diuresis as below -pt can follow up with Dr. Sherene Sires at [facility] once discharged from [facility]  Atrial fibrillation Acute on chronic diastolic  heart failure Non-Ischemic Cardiomyopathy HTN Patient has known history of CHF.  10/9 had episode of chest pain and acute onset Afib ans was started on Amiodarone per Cardiology.  ECHO at that time demonstrated an EF of 40-45%.  Pulmonary artery catheter was placed to assess PA pressures / intravascular volume.  She further underwent cardiac catheterization on 10/15 with minimal nonobstructive CAD, elevated LV EDP.  Care was focused to keep patient in negative to euvolemic state / afterload reduction. She is down 25 lbs since admit.   Repeat ECHO on 10/16 with and EF of 20-25%, diffuse hypokinesis.  Initially she was started on heparin IV and was transitioned to apixaban for atrial fibrillation.    Discharge Plan: -amiodarone 200 mg BID -apixaban daily -hold asa -acetazolamide 500 BID for metabolic acidosis -resume demadex on 10/22, likely will need KCL replacement -follow up with Dr. Gala Romney at [facility]   Acute on Chronic Renal Insufficiency Hypokalemia Baseline creatinine 1.5.  Patient noted to have acute on chronic renal insufficiency in setting of acute critical illness.  Serum creatinine has returned to near baseline 1.55-1.78 on 10/20, 10/21.    Discharge Plan: -follow up BMP closely to trend renal function -diuresis per Cardiology  Morbid Obesity Anemia Diabetes Mellitus  Discharge Plan: -continue current insulin coverage -ferrous sulfate QD -low sodium heart healthy diet -exercise as tolerated per PT   Encephalopathy Anxiety  Depression Chronic anxiety / depression on xanax / celexa prior to admit.  Acute encephalopathy noted on admit in the setting of hypercarbia.  Mental status resolved with treatment of respiratory failure / CHF.  CT of the head negative on admit (10/8).    Discharge Plan: -contiue xanax, celexa   Discharge Exam: General: Morbidly obese on [place]  Neuro: Alert and oriented x4  HEENT: Mm pink/moist  Cardiovascular: S1s2, regular rhythm    Lungs: Coarse breath sounds  Abdomen: Obese/soft, non tender  Musculoskeletal: No acute deformities  Skin: Intact, improved rash under panus    Filed Vitals:   11/01/11 1200 11/01/11 1300 11/01/11 1400 11/01/11 1434  BP: 130/73 118/55 113/41   Pulse: 73 87 77   Temp: 98.6 F (37 C) 98 F (36.7 C) 98 F (36.7 C)   TempSrc:      Resp: 18 17 19    Height:      Weight:      SpO2: 95% 95% 95% 94%     Discharge Labs  BMET  Lab 11/01/11 0500 10/31/11 0507 10/30/11 1400 10/30/11 0410 10/29/11 1800  NA 140 144 151* 154* 150*  K 5.0 4.3 -- -- --  CL 93* 97 100 101 98  CO2 44* 43* 44* 44* 45*  GLUCOSE 106* 193* 206* 118* 283*  BUN 68* 71* 77* 78* 71*  CREATININE 1.78* 1.55* 1.77* 1.70* 1.54*  CALCIUM 8.9 8.8 9.4 9.0 9.2  MG -- -- -- 1.9 --  PHOS -- -- -- -- --   CBC  Lab 11/01/11 0500 10/31/11 0507 10/30/11 0410  HGB 10.9* 10.9* 11.3*  HCT 35.4* 35.7* 36.2  WBC 13.7* 9.8 10.8*  PLT 145* 164 198   Anti-Coagulation  Lab 10/26/11 0500  INR 1.19    Discharge  Orders    Future Orders Please Complete By Expires   Diet - low sodium heart healthy      Increase activity slowly           Medication List     As of 11/01/2011  3:35 PM    START taking these medications         acetaZOLAMIDE 500 MG capsule   Commonly known as: DIAMOX   Take 1 capsule (500 mg total) by mouth every 12 (twelve) hours.      amiodarone 200 MG tablet   Commonly known as: PACERONE   Take 1 tablet (200 mg total) by mouth 2 (two) times daily.      apixaban 5 MG Tabs tablet   Commonly known as: ELIQUIS   Take 1 tablet (5 mg total) by mouth 2 (two) times daily.      * insulin aspart protamine-insulin aspart (70-30) 100 UNIT/ML injection   Commonly known as: NOVOLOG 70/30   Inject 25 Units into the skin 2 (two) times daily with a meal.      predniSONE 10 MG tablet   Commonly known as: DELTASONE   Take 4 tabs daily for 3 days, then 3 tabs daily for 3 days, then 2 tabs daily for 3 days,  then 1 tab daily for 3 days and stop      sodium chloride 0.9 % infusion   Inject 250 mLs into the vein as needed (if IV carrier fluid needed.).     * Notice: This list has 1 medication(s) that are the same as other medications prescribed for you. Read the directions carefully, and ask your doctor or other care provider to review them with you.    CONTINUE taking these medications         acetaminophen 500 MG tablet   Commonly known as: TYLENOL      * albuterol 108 (90 BASE) MCG/ACT inhaler   Commonly known as: PROVENTIL HFA;VENTOLIN HFA      * albuterol (2.5 MG/3ML) 0.083% nebulizer solution   Commonly known as: PROVENTIL      ALPRAZolam 0.25 MG tablet   Commonly known as: XANAX   Take 1 tablet (0.25 mg total) by mouth 2 (two) times daily as needed for sleep.      budesonide-formoterol 160-4.5 MCG/ACT inhaler   Commonly known as: SYMBICORT      calcitRIOL 0.25 MCG capsule   Commonly known as: ROCALTROL      citalopram 40 MG tablet   Commonly known as: CELEXA   Take 0.5 tablets (20 mg total) by mouth daily.      ferrous sulfate 325 (65 FE) MG tablet      guaiFENesin 600 MG 12 hr tablet   Commonly known as: MUCINEX   Take 2 tablets (1,200 mg total) by mouth 2 (two) times daily.      * insulin aspart protamine-insulin aspart (70-30) 100 UNIT/ML injection   Commonly known as: NOVOLOG 70/30   Inject 30 Units into the skin 2 (two) times daily with a meal.      multivitamin tablet      tiotropium 18 MCG inhalation capsule   Commonly known as: SPIRIVA     * Notice: This list has 3 medication(s) that are the same as other medications prescribed for you. Read the directions carefully, and ask your doctor or other care provider to review them with you.    STOP taking these medications  amLODipine 5 MG tablet   Commonly known as: NORVASC      aspirin 81 MG tablet      furosemide 40 MG tablet   Commonly known as: LASIX          Where to get your medications         Information on where to get these meds is not yet available. Ask your nurse or doctor.         acetaZOLAMIDE 500 MG capsule   amiodarone 200 MG tablet   apixaban 5 MG Tabs tablet   insulin aspart protamine-insulin aspart (70-30) 100 UNIT/ML injection   predniSONE 10 MG tablet   sodium chloride 0.9 % infusion              Disposition: 03-Skilled Nursing Facility  Discharged Condition: [name] has met maximum benefit of inpatient care and is medically stable and cleared for discharge.  Patient is pending follow up as above.      Time spent on disposition:  Greater than 35 minutes.   Signed: Canary Brim, NP-C [facility] Pgr: [phone]  I have seen and examined this patient and agree with this plan of care. Shan Levans MD

## 2011-11-01 NOTE — Progress Notes (Signed)
Patient ID: NYJAE HODGE, female   DOB: 02-09-1945, 66 y.o.   MRN: [id number] Advanced Heart Failure Rounding Note   Subjective:    Ms. Mahler is a 66 y/o woman with multiple medical problems including morbid obesity, COPD on 4L home O2, systolic CHF with EF 40-45% DM2, HTN, CRI (baseline ~1.50) and HL. She has had multiple admissions for respiratory failure.   Came to ER 10/8 for two days of CP and progressive dyspnea. Found to be in respiratory failure. ABG 7.1/120/163/98% on facemask. Intubated in ER for PNA +/- HF (Baseline weight 273)  Events Since Admission:  10/8 - Admit with chest pain, respiratory failure, AMS. Intubated 10/8  10/09: episode of a-fib with chest discomfort. Started on amiodarone on 10/10  10/10: extubated  10/11: reintubated  10/13: extubated  10:14: reintubated  10:15: cardiac cath: Minimal nonobstructive CAD. 10/17- NCB established, continue lasix  10/19 extubated. NO reintubtion  Weight down 25 pounds with IV lasix and nesiritde. IV lasix held due to sodium of 154>149>140. Yesterday she was started on Demadex 40 mg daily. Maintaining NSR on po amio.   Denies SOB/Orthopnea  CVP checked personally = 5  Echo done 10/16 to try to evaluate the RV did not show the RV well but EF was 20-25% with global hypokinesis. (RV not terrible. Probably moderately dilated and mildly HK)  Objective:   Weight Range:  Vital Signs:   Temp:  [97.2 F (36.2 C)-99.2 F (37.3 C)] 98.1 F (36.7 C) (10/21 0900) Pulse Rate:  [66-104] 74  (10/21 0900) Resp:  [13-22] 15  (10/21 0900) BP: (110-147)/(35-67) 132/50 mmHg (10/21 0900) SpO2:  [89 %-98 %] 91 % (10/21 0900) Weight:  [112.6 kg (248 lb 3.8 oz)] 112.6 kg (248 lb 3.8 oz) (10/21 0400) Last BM Date: 10/29/11  Weight change: Filed Weights   10/30/11 0500 10/31/11 0500 11/01/11 0400  Weight: 112.9 kg (248 lb 14.4 oz) 114.5 kg (252 lb 6.8 oz) 112.6 kg (248 lb 3.8 oz)    Intake/Output:   Intake/Output Summary (Last 24  hours) at 11/01/11 1036 Last data filed at 11/01/11 0900  Gross per 24 hour  Intake 1487.5 ml  Output   4825 ml  Net -3337.5 ml     Physical Exam: General: Morbidly obese. Neuro: Awake, alert HEENT: Normal  Cardiovascular: PMI nonpalpable. Distant HS s1s2 Regular Lungs:clear  Abdomen: Obese/soft,  Nontender. Good BS Flexi Seal in place Musculoskeletal: No acute deformities  Skin: Intact, rash under pannus, long unkempt toenails  Extremities: No edema , GU: Foley    Telemetry: Sinus rhythm 74  Labs: Basic Metabolic Panel:  Lab 11/01/11 7829 10/31/11 0507 10/30/11 1400 10/30/11 0410 10/29/11 1800  NA 140 144 151* 154* 150*  K 5.0 4.3 4.0 2.7* 3.2*  CL 93* 97 100 101 98  CO2 44* 43* 44* 44* 45*  GLUCOSE 106* 193* 206* 118* 283*  BUN 68* 71* 77* 78* 71*  CREATININE 1.78* 1.55* 1.77* 1.70* 1.54*  CALCIUM 8.9 8.8 9.4 -- --  MG -- -- -- 1.9 --  PHOS -- -- -- -- --    Liver Function Tests:  Lab 10/31/11 0507  AST 12  ALT 19  ALKPHOS 43  BILITOT 0.5  PROT 5.9*  ALBUMIN 2.7*   No results found for this basename: LIPASE:5,AMYLASE:5 in the last 168 hours No results found for this basename: AMMONIA:3 in the last 168 hours  CBC:  Lab 11/01/11 0500 10/31/11 0507 10/30/11 0410 10/29/11 0420 10/28/11 0523 10/27/11 0300  WBC 13.7* 9.8 10.8* 9.4 7.7 --  NEUTROABS -- -- -- -- -- 5.9  HGB 10.9* 10.9* 11.3* 10.6* 10.4* --  HCT 35.4* 35.7* 36.2 34.6* 33.2* --  MCV 89.2 91.1 93.8 90.8 91.0 --  PLT 145* 164 198 215 205 --    Cardiac Enzymes: No results found for this basename: CKTOTAL:5,CKMB:5,CKMBINDEX:5,TROPONINI:5 in the last 168 hours  BNP: BNP (last 3 results)  Basename 10/21/11 0455 10/19/11 1453 06/26/11 0526  PROBNP 13824.0* 9736.0* 7258.0*     Other results:     Imaging: Dg Chest Port 1 View  11/01/2011  *RADIOLOGY REPORT*  Clinical Data: Pulmonary edema.  PORTABLE CHEST - 1 VIEW  Comparison: 10/31/2011  Findings: Stable positioning of central line.   Stable cardiomegaly. Overall pattern of interstitial edema appears improved with mild interstitial prominence remaining.  No pleural effusions are identified.  IMPRESSION: Improving pulmonary edema.   Original Report Authenticated By: Reola Calkins, M.D.    Dg Chest Port 1 View  10/31/2011  *RADIOLOGY REPORT*  Clinical Data: Pulmonary edema, post extubation  PORTABLE CHEST - 1 VIEW  Comparison: 11/30/2011; 11/29/2011; 11/28/2011  Findings: Grossly unchanged enlarged cardiac silhouette and mediastinal contours with prominence of the central pulmonary vasculature.  Interval extubation and removal of enteric tube. Stable positioning of left jugular approach of venous catheter with tip projected over the superior SVC.  The pulmonary vasculature remains indistinct with cephalization of flow. Perihilar and bibasilar opacities are grossly unchanged.  There is mild elevation left hemidiaphragm.  No definite pleural effusion or pneumothorax. Unchanged bones.  IMPRESSION: 1.  Interval extubation and removal of enteric tube.  No pneumothorax. 2.  Persistently reduced lung volumes with findings suggestive of pulmonary edema and perihilar/bibasilar atelectasis. 3.  Unchanged cardiomegaly and prominence of the central pulmonary vasculature, nonspecific but may be seen in the setting of pulmonary arterial hypertension.  Further evaluation with cardiac echo may performed as clinically indicated.   Original Report Authenticated By: Waynard Reeds, M.D.      Medications:     Scheduled Medications:    . acetaZOLAMIDE  500 mg Oral Q12H  . albuterol  2.5 mg Nebulization Q6H  . ALPRAZolam  0.5 mg Oral TID  . amiodarone  200 mg Oral BID  . antiseptic oral rinse  15 mL Mouth Rinse QID  . apixaban  5 mg Oral BID  . chlorhexidine  15 mL Mouth Rinse BID  . Chlorhexidine Gluconate Cloth  6 each Topical Q0600  . citalopram  20 mg Oral Daily  . ferrous sulfate  325 mg Oral Q breakfast  . insulin aspart  0-5 Units  Subcutaneous QHS  . insulin aspart  0-9 Units Subcutaneous TID WC  . insulin aspart protamine-insulin aspart  20 Units Subcutaneous BID WC  . methylPREDNISolone (SOLU-MEDROL) injection  40 mg Intravenous Q12H  . multivitamin with minerals  1 tablet Oral Daily  . mupirocin ointment   Nasal BID  . pantoprazole  40 mg Oral Q breakfast  . tiotropium  18 mcg Inhalation Daily  . DISCONTD: acetaZOLAMIDE  500 mg Oral BID  . DISCONTD: ferrous sulfate  300 mg Oral Q breakfast  . DISCONTD: multivitamin  5 mL Oral Daily  . DISCONTD: pantoprazole sodium  40 mg Oral Daily  . DISCONTD: potassium chloride  40 mEq Oral BID  . DISCONTD: torsemide  40 mg Oral Daily    Infusions:    . sodium chloride 20 mL/hr at 10/28/11 2126  . DISCONTD: heparin 1,250 Units/hr (10/31/11  0600)  . DISCONTD: heparin 1,350 Units/hr (10/31/11 1950)    PRN Medications: sodium chloride, albuterol   Assessment:  1. A/C respiratory failure -- multifactorial 2. A/c systolic HF EF 20-25%, nonischemic  3. O2 dependent COPD  4. Chronic renal failure  5. DM2  6. Morbid obesity  7. Chest pain with negative troponins: LHC on 10/15 with minimal coronary disease.  8. Anemia 9. A/C renal failure 10. AF with RVR -> NSR on amio -> AF->NSR on amio gtt 11. Hypernatremia/hypokalemia  Plan/Discussion:    Volume status looks good. CVP 5 Weight down 25 pounds. Renal Function trending up. Hold demadex and potassium. Add diamox 500 mg po bid.   Keep on amio 200 bid for now. Stop Heparin and start apixaban 5 bid.  Continue to mobilize.   OK to go to stepdown with [facility]. We will continue to follow on daily basis. It will be very important to keep I/Os even and weight stable. Will likely start po demadex back in a day or two.  Daniel Bensimhon,MD 10:38 AM

## 2011-11-01 NOTE — Evaluation (Signed)
Occupational Therapy Evaluation Patient Details Name: [name] MRN: [id number] DOB: 1945-05-24 Today's Date: 11/01/2011 Time: 0865-7846 OT Time Calculation (min): 34 min  OT Assessment / Plan / Recommendation Clinical Impression  66 y.o. female admitted 10/8 - Admit with chest pain, respiratory failure, AMS. Intubated 10/8.   Pt. was reintubated x 2, and extubated x 3 with last extubation 10/19.  Pt presents to OT with the following deficits and will benefit from OT to maximize safety and independence with BADLs to allow her to return home with min A after [facility] stay      OT Assessment  Patient needs continued OT Services    Follow Up Recommendations  LTACH    Barriers to Discharge None    Equipment Recommendations  3 in 1 bedside comode    Recommendations for Other Services    Frequency  Min 2X/week    Precautions / Restrictions Precautions Precautions: Other (comment) (multiple lines/leads) Restrictions Weight Bearing Restrictions: No       ADL  Eating/Feeding: Independent Where Assessed - Eating/Feeding: Chair Grooming: Brushing hair;Min guard Where Assessed - Grooming: Unsupported sitting Upper Body Bathing: Minimal assistance Where Assessed - Upper Body Bathing: Unsupported sitting Lower Body Bathing: Maximal assistance Where Assessed - Lower Body Bathing: Supported sit to stand Upper Body Dressing: Maximal assistance Where Assessed - Upper Body Dressing: Unsupported sitting Lower Body Dressing: +1 Total assistance Where Assessed - Lower Body Dressing: Unsupported sitting Toilet Transfer: +2 Total assistance Toilet Transfer: Patient Percentage: 50% Toilet Transfer Method: Stand pivot Toilet Transfer Equipment: Other (comment) (recliner) Toileting - Clothing Manipulation and Hygiene: +1 Total assistance Where Assessed - Toileting Clothing Manipulation and Hygiene: Sit to stand from 3-in-1 or toilet Transfers/Ambulation Related to ADLs: Transferred to  recliner with total A +2 (pt. ~50-60%)    OT Diagnosis: Generalized weakness  OT Problem List: Decreased strength;Decreased activity tolerance;Impaired balance (sitting and/or standing);Decreased knowledge of use of DME or AE;Cardiopulmonary status limiting activity;Obesity OT Treatment Interventions: Self-care/ADL training;DME and/or AE instruction;Therapeutic activities;Patient/family education;Balance training   OT Goals Acute Rehab OT Goals OT Goal Formulation: With patient Time For Goal Achievement: 11/15/11 Potential to Achieve Goals: Good ADL Goals Pt Will Perform Grooming: with min assist;Standing at sink ADL Goal: Grooming - Progress: Goal set today Pt Will Perform Upper Body Bathing: with supervision;Sitting, chair ADL Goal: Upper Body Bathing - Progress: Goal set today Pt Will Perform Lower Body Bathing: with mod assist;Sit to stand from chair;Sit to stand from bed ADL Goal: Lower Body Bathing - Progress: Goal set today Pt Will Perform Lower Body Dressing: with mod assist;with adaptive equipment;Sit to stand from chair;Sit to stand from bed ADL Goal: Lower Body Dressing - Progress: Goal set today Pt Will Transfer to Toilet: with min assist;3-in-1;Stand pivot transfer ADL Goal: Toilet Transfer - Progress: Goal set today Pt Will Perform Toileting - Clothing Manipulation: with mod assist;Standing ADL Goal: Toileting - Clothing Manipulation - Progress: Goal set today Pt Will Perform Toileting - Hygiene: with mod assist;Standing at 3-in-1/toilet ADL Goal: Toileting - Hygiene - Progress: Goal set today Additional ADL Goal #1: Pt. will actively participate in 25 mins. of therapeutic activity with one rest break to allow pt. to improve activity tolerance/endurance for ADLS ADL Goal: Additional Goal #1 - Progress: Goal set today  Visit Information  Last OT Received On: 11/01/11    Subjective Data  Subjective: "You two can't lift me" Patient Stated Goal: To get better   Prior  Functioning     Home Living Lives  With: Daughter Available Help at Discharge: Available PRN/intermittently Type of Home: House Home Access: Stairs to enter [employer] of Steps: 2-3 Entrance Stairs-Rails: Left;Right;Can reach both Home Layout: One level Bathroom Shower/Tub: Forensic scientist: Standard Bathroom Accessibility: Yes How Accessible: Accessible via walker Home Adaptive Equipment: Tub transfer bench;Walker - rolling Prior Function Level of Independence: Needs assistance Needs Assistance: Meal Prep Meal Prep: Moderate Driving: Yes Communication Communication: No difficulties         Vision/Perception     Cognition  Overall Cognitive Status: Appears within functional limits for tasks assessed/performed Arousal/Alertness: Awake/alert Orientation Level: Appears intact for tasks assessed Behavior During Session: Anxious (mildly anxious)    Extremity/Trunk Assessment Right Upper Extremity Assessment RUE ROM/Strength/Tone: Within functional levels RUE Coordination: WFL - gross/fine motor Left Upper Extremity Assessment LUE ROM/Strength/Tone: Within functional levels LUE Coordination: WFL - gross/fine motor Trunk Assessment Trunk Assessment: Normal     Mobility Bed Mobility Bed Mobility: Supine to Sit;Sitting - Scoot to Edge of Bed Supine to Sit: 1: +2 Total assist;HOB elevated Supine to Sit: Patient Percentage: 60% Sitting - Scoot to Edge of Bed: 3: Mod assist Details for Bed Mobility Assistance: Reqired assist for LEs and to lift UB Transfers Transfers: Sit to Stand;Stand to Sit Sit to Stand: 1: +2 Total assist;From bed Sit to Stand: Patient Percentage: 50% Stand to Sit: 1: +2 Total assist;To chair/3-in-1 Stand to Sit: Patient Percentage: 60% Details for Transfer Assistance: Pt. anxious about standing.  Encouragement provided.  Pt. initially unsteady as she stood.  She required step by step instruction for pivot  transfer, and to advance LEs.  Pt. fatigued after pivot to chair     Shoulder Instructions     Exercise     Balance Balance Balance Assessed: Yes Static Sitting Balance Static Sitting - Balance Support: Right upper extremity supported;Feet supported Static Sitting - Level of Assistance: 5: Stand by assistance Static Sitting - Comment/# of Minutes: 5 - pt. with complaint dizziness/lightheadedness, but BP stable   End of Session OT - End of Session Activity Tolerance: Patient limited by fatigue Patient left: in chair;with call bell/phone within reach Nurse Communication: Mobility status  GO     Esmirna Ravan, Ursula Alert M 11/01/2011, 12:22 PM

## 2011-11-01 NOTE — Evaluation (Signed)
Physical Therapy Evaluation Patient Details Name: [name] MRN: [id number] DOB: 1945-09-13 Today's Date: 11/01/2011 Time: 4098-1191 PT Time Calculation (min): 34 min  PT Assessment / Plan / Recommendation Clinical Impression  pt presents with PNA, COPD, CHF, and multiple intubations.  pt needs encouragement for mobility and requires 2 person A for all mobility.  pt would benefit from Ltach vs SNF at D/C.      PT Assessment  Patient needs continued PT services    Follow Up Recommendations  Post acute inpatient    Does the patient have the potential to tolerate intense rehabilitation   No, Recommend LTACH  Barriers to Discharge None      Equipment Recommendations  3 in 1 bedside comode    Recommendations for Other Services     Frequency Min 3X/week    Precautions / Restrictions Precautions Precautions: Other (comment) (multiple lines/leads) Restrictions Weight Bearing Restrictions: No   Pertinent Vitals/Pain Pt's O2 sats remained 91-96% on 5L O2.        Mobility  Bed Mobility Bed Mobility: Supine to Sit;Sitting - Scoot to Edge of Bed Supine to Sit: 1: +2 Total assist;HOB elevated Supine to Sit: Patient Percentage: 60% Sitting - Scoot to Edge of Bed: 3: Mod assist Details for Bed Mobility Assistance: Reqired assist for LEs and to lift UB Transfers Transfers: Sit to Stand;Stand to Sit Sit to Stand: 1: +2 Total assist;From bed Sit to Stand: Patient Percentage: 50% Stand to Sit: 1: +2 Total assist;To chair/3-in-1 Stand to Sit: Patient Percentage: 60% Details for Transfer Assistance: Pt. anxious about standing.  Encouragement provided.  Pt. initially unsteady as she stood.  She required step by step instruction for pivot transfer, and to advance LEs.  Pt. fatigued after pivot to chair Ambulation/Gait Ambulation/Gait Assistance: Not tested (comment) Stairs: No Wheelchair Mobility Wheelchair Mobility: No    Shoulder Instructions     Exercises     PT  Diagnosis: Difficulty walking  PT Problem List: Decreased activity tolerance;Decreased balance;Decreased mobility;Decreased knowledge of use of DME;Cardiopulmonary status limiting activity;Obesity PT Treatment Interventions: DME instruction;Gait training;Stair training;Functional mobility training;Therapeutic activities;Therapeutic exercise;Balance training;Patient/family education   PT Goals Acute Rehab PT Goals PT Goal Formulation: With patient Time For Goal Achievement: 11/15/11 Potential to Achieve Goals: Good Pt will go Supine/Side to Sit: with supervision PT Goal: Supine/Side to Sit - Progress: Goal set today Pt will go Sit to Supine/Side: with supervision PT Goal: Sit to Supine/Side - Progress: Goal set today Pt will go Sit to Stand: with supervision PT Goal: Sit to Stand - Progress: Goal set today Pt will go Stand to Sit: with supervision PT Goal: Stand to Sit - Progress: Goal set today Pt will Ambulate: >150 feet;with supervision;with rolling walker PT Goal: Ambulate - Progress: Goal set today Pt will Go Up / Down Stairs: 3-5 stairs;with min assist;with rail(s) PT Goal: Up/Down Stairs - Progress: Goal set today  Visit Information  Last PT Received On: 11/01/11 Assistance Needed: +2 PT/OT Co-Evaluation/Treatment: Yes    Subjective Data  Subjective: You two can lift me.   Patient Stated Goal: None stated.     Prior Functioning  Home Living Lives With: Daughter Available Help at Discharge: Available PRN/intermittently Type of Home: House Home Access: Stairs to enter [employer] of Steps: 2-3 Entrance Stairs-Rails: Left;Right;Can reach both Home Layout: One level Bathroom Shower/Tub: Forensic scientist: Standard Bathroom Accessibility: Yes How Accessible: Accessible via walker Home Adaptive Equipment: Tub transfer bench;Walker - rolling Prior Function Level of Independence: Needs  assistance Needs Assistance: Meal Prep Meal Prep:  Moderate Driving: Yes Communication Communication: No difficulties    Cognition  Overall Cognitive Status: Appears within functional limits for tasks assessed/performed Arousal/Alertness: Awake/alert Orientation Level: Appears intact for tasks assessed Behavior During Session: Anxious (mildly anxious)    Extremity/Trunk Assessment Right Upper Extremity Assessment RUE ROM/Strength/Tone: Within functional levels RUE Coordination: WFL - gross/fine motor Left Upper Extremity Assessment LUE ROM/Strength/Tone: Within functional levels LUE Coordination: WFL - gross/fine motor Right Lower Extremity Assessment RLE ROM/Strength/Tone: WFL for tasks assessed RLE Sensation: WFL - Light Touch Left Lower Extremity Assessment LLE ROM/Strength/Tone: WFL for tasks assessed LLE Sensation: WFL - Light Touch Trunk Assessment Trunk Assessment: Normal   Balance Balance Balance Assessed: Yes Static Sitting Balance Static Sitting - Balance Support: Right upper extremity supported;Feet supported Static Sitting - Level of Assistance: 5: Stand by assistance Static Sitting - Comment/# of Minutes: pt indicates feeling lightheaded, but BP remained stable.    End of Session PT - End of Session Equipment Utilized During Treatment: Oxygen Activity Tolerance: Patient limited by fatigue Patient left: in chair;with call bell/phone within reach Nurse Communication: Mobility status  GP     Sunny Schlein, [place] [phone] 11/01/2011, 2:27 PM

## 2011-11-01 NOTE — Progress Notes (Signed)
Name: [name] MRN: [id number] DOB: 1945/06/30    LOS: 13  Referring Provider:  EDP -Patria Mane Reason for Referral:  Acute Respiratory Failure   PULMONARY / CRITICAL CARE MEDICINE 66 yo female with h/o COPD (4L at home), DM2, systolic CHF (EF: 20-25% echo: 10/16), morbid obesity, Chronic Renal Insufficiency (Cr baseline:1.5) who presented in acute respiratory failure and was intubated.   Events Since Admission: 10/8 - Admit with chest pain, respiratory failure, AMS. Intubated 10/8 10/09: episode of a-fib with chest discomfort. Started on amiodarone on 10/10 10/10: extubated  10/11: reintubated 10/13: extubated 10:14: reintubated 10:15: cardiac cath:  10/17- NCB established, continue lasix 10/19 extubated. NO reintubtion  Subjective:  tol extubation well since 10/19  Vital Signs: Temp:  [97.8 F (36.6 C)-99.2 F (37.3 C)] 97.8 F (36.6 C) (10/21 0500) Pulse Rate:  [65-104] 71  (10/21 0500) Resp:  [15-22] 16  (10/21 0500) BP: (110-147)/(35-67) 124/55 mmHg (10/21 0500) SpO2:  [89 %-98 %] 97 % (10/21 0500) Weight:  [248 lb 3.8 oz (112.6 kg)] 248 lb 3.8 oz (112.6 kg) (10/21 0400)  Intake/Output Summary (Last 24 hours) at 11/01/11 4098 Last data filed at 11/01/11 0600  Gross per 24 hour  Intake   1997 ml  Output   4455 ml  Net  -2458 ml   Physical Examination: General:  Morbidly obese on [place] Neuro:  Alert and oriented x4 HEENT:  Mm pink/moist Cardiovascular:  S1s2, regular rhythm Lungs:  Coarse breath sounds Abdomen:  Obese/soft, non tender Musculoskeletal:  No acute deformities Skin:  Intact, improved rash under panus   Active Problems:  Pulmonary edema  Hypoxemia  COPD (chronic obstructive pulmonary disease)  Atrial fibrillation  Acute on chronic diastolic heart failure  Hypokalemia   ASSESSMENT AND PLAN  PULMONARY  Lab 10/30/11 0952  PHART 7.509*  PCO2ART 60.5*  PO2ART 66.0*  HCO3 48.3*  O2SAT 94.0   [place] 4L  10/21: pulmonary edema improved ETT:   10/8>>>10/10 Reintubation: 10/11>>>10/13 Extubation: 10/13 Re-intubation 10/14>>10/19 A:   Acute Hypercarbic Respiratory Failure, OSA/OHS + anxiety COPD on 4L home O2CHF systolic and diastolic (EF:40-45%)>>now EF: 11-91%  Pt extubated 10/19 successfully, no plans to reintubate  P:   -cont BD, oxygen: on home dose at 4L, diuresis per cards, spiriva, pt/ot  CARDIOVASCULAR No results found for this basename: TROPONINI:5,LATICACIDVEN:5, O2SATVEN:5,PROBNP:5 in the last 168 hours ECG:   Lines:  10/8 L IJ TLC>>> PAC  10/11>>10/17  A:  A-fib-  Hx of HTN - on norvasc, lasix at home Systolic/diastolic CHF: EF:40-45%. Respiratory decompensation could be associated with increased RLV stiffness. - cath non obstructing CAD with increased LVEDP Repeat Echo: 10/16: 20-25% EF with global hypokinesis-non ischemic cardiomyopathy  P:   - per cardiology - continue amiodarone po - heparin d/ced, start eliquis - demedex and K d/ced 10/21, start diamox for met alkalosis   RENAL  Lab 11/01/11 0500 10/31/11 0507 10/30/11 1400 10/30/11 0410 10/29/11 1800  NA 140 144 151* 154* 150*  K 5.0 4.3 -- -- --  CL 93* 97 100 101 98  CO2 44* 43* 44* 44* 45*  BUN 68* 71* 77* 78* 71*  CREATININE 1.78* 1.55* 1.77* 1.70* 1.54*  CALCIUM 8.9 8.8 9.4 9.0 9.2  MG -- -- -- 1.9 --  PHOS -- -- -- -- --   Intake/Output      10/20 0701 - 10/21 0700   P.O. 1200   I.V. (mL/kg) 764.5 (6.8)   Total Intake(mL/kg) 1964.5 (17.4)   Urine (mL/kg/hr) 4455 (  1.6)   Total Output 4455   Net -2490.5        Foley:  10/8 >>>  A:   Acute Renal Insufficiency on chronic renal insufficiency: baseline Cr: 1.5 -on chronic lasix at home  P:   - demedex stopped.  - diamox started -bmp daily  GASTROINTESTINAL  Lab 10/31/11 0507  AST 12  ALT 19  ALKPHOS 43  BILITOT 0.5  PROT 5.9*  ALBUMIN 2.7*   A:   Morbid Obesity  P:   ppi Low sodium diet  HEMATOLOGIC  Lab 11/01/11 0500 10/31/11 0507 10/30/11 0410 10/29/11  0420 10/28/11 0523 10/26/11 0500  HGB 10.9* 10.9* 11.3* 10.6* 10.4* --  HCT 35.4* 35.7* 36.2 34.6* 33.2* --  PLT 145* 164 198 215 205 --  INR -- -- -- -- -- 1.19  APTT -- -- -- -- -- --   A:   Hx of Anemia (Hg:10.5) stable  Mild decline in platelet count P:  -monitor platelet count: heparin changed to apixaban -doppler legs - neg, no role CT chest  INFECTIOUS  Lab 11/01/11 0500 10/31/11 0507 10/30/11 0410 10/29/11 0420 10/28/11 0523  WBC 13.7* 9.8 10.8* 9.4 7.7  PROCALCITON -- -- -- -- --   Cultures: 10/8 BCx2>>>no growth to date 10/8 Sputum>>> not collected 10/8 UA>>> negative 10/8 urine strep>>> not collected 10/8 urine legionella >>> not collected  Antibiotics: Zosyn (?HCAP) 10/8>>> 10/15 Vanco (?HCAP) 10/8>>> 10/10 Diflucan (yeast under pannus) 10/8>>>10/10  A:   Bilateral airspace disease -likely edema but difficult to interperet due to body habitus Empiric vanc and zosyn for HCAP on 10/10. Vanc d/ced 10/10. Zosyn d/ced 10/15 P:   Increase in WBC: send ua and urine culture. Off all ABX  ENDOCRINE  Lab 10/31/11 2334 10/31/11 2200 10/31/11 1710 10/31/11 1512 10/31/11 1103  GLUCAP 171* 326* 166* 215* 269*  required 13 u novolog ISS A:   DM - on 70/30 at home 30 units BID    A1C: 7.3 P:   -increase  70/30 bid 25  units - steroids: change to oral prednisone 40 daily Low temp prior, TSH: 0.236: low. Low T3 and normal T4. Unclear picture. Repeat after acute hospitalization.   NEUROLOGIC 10/8 head CT>>> no acute process A:   AMS - likely related to hypercarbia. Now back to baseline Hx anxiety / depression - on xanax, celexa at home P:   Continue po xanax scheduled On home citalopram   BEST PRACTICE / DISPOSITION Level of Care:  ICU Primary Service:  PCCM Consultants:  cardiology Code Status:  DNR/DNI, no intubation or trach wished Diet: PO DVT Px:  eliquis GI Px:  Protonix Skin Integrity:  Candidal rash improving Social / Family: family updated on  10/21. Transfer to tele. Plan for transfer to [facility] on 10/22 Ccm time 30 min    LOSQ, [facility] Residency, PGY-2 ICU rotation  I have seen and examined this pt with resident and agree with the plan of care above outlined.  Dorcas Carrow Beeper  [phone]  Cell  [phone]  If no response or cell goes to voicemail, call beeper [phone]  11/01/2011

## 2011-11-01 NOTE — Progress Notes (Signed)
Brief Nutrition Note  Patient was extubated on 10/19 with plans for no reintubation.  Diet has been advanced to 2 gm sodium.  Patient is eating very well, consuming 75-100% of meals.  Previous nutrition diagnosis has been resolved.  No new nutrition diagnosis at this time.  Plans to transfer to [facility] soon.  No nutrition intervention needed at this time.   Joaquin Courts, RD, LDN, CNSC Pager# [phone] After Hours Pager# [phone]

## 2011-11-02 ENCOUNTER — Ambulatory Visit: Payer: Medicare Other | Admitting: Internal Medicine

## 2011-11-03 NOTE — Progress Notes (Signed)
Extensive talk. Pt contributed in full  [employer]. Tyson Alias, MD, FACP Pgr: [phone] [place]

## 2011-11-04 LAB — URINE CULTURE: Colony Count: >=100000

## 2011-11-25 ENCOUNTER — Ambulatory Visit (HOSPITAL_COMMUNITY): Payer: Medicare Other | Attending: Internal Medicine

## 2011-12-07 ENCOUNTER — Inpatient Hospital Stay (HOSPITAL_COMMUNITY): Admission: RE | Admit: 2011-12-07 | Payer: Medicare Other | Source: Ambulatory Visit

## 2011-12-08 ENCOUNTER — Encounter (HOSPITAL_COMMUNITY): Payer: Medicare Other

## 2011-12-09 ENCOUNTER — Encounter: Payer: Self-pay | Admitting: Internal Medicine

## 2011-12-13 ENCOUNTER — Ambulatory Visit (HOSPITAL_COMMUNITY)
Admission: RE | Admit: 2011-12-13 | Discharge: 2011-12-13 | Disposition: A | Discharge status: Home / Self Care | Payer: Medicare Other | Source: Ambulatory Visit | Attending: Internal Medicine | Admitting: Internal Medicine

## 2011-12-13 ENCOUNTER — Encounter (HOSPITAL_COMMUNITY): Payer: Self-pay

## 2011-12-13 VITALS — BP 148/62 | HR 106 | Wt 257.4 lb

## 2011-12-13 DIAGNOSIS — N289 Disorder of kidney and ureter, unspecified: Secondary | ICD-10-CM | POA: Insufficient documentation

## 2011-12-13 DIAGNOSIS — N19 Unspecified kidney failure: Secondary | ICD-10-CM

## 2011-12-13 DIAGNOSIS — I5022 Chronic systolic (congestive) heart failure: Secondary | ICD-10-CM

## 2011-12-13 DIAGNOSIS — I509 Heart failure, unspecified: Secondary | ICD-10-CM

## 2011-12-13 NOTE — Progress Notes (Signed)
Weight Range      Baseline proBNP   1715    HPI: 66 y/o woman with multiple medical problems including morbid obesity, COPD on 4L home O2, DM2, HTN and HL.  Systolic HF secondary to NICM with EF 20-25% and CRI with baseline Cr 1.5. She has had multiple admissions for respiratory failure.    Echo 03/24/11: LVEF 30-35% with mild concentric hypertrophy.  Mild MR.  LA mildly dilated.  Myoview 03/25/11:  No evidence for inducible ischemia. Moderate inferolateral and small distal anteroseptal fixed defects are identified and may be related to scar.  Inferior and mid/distal inferolateral hypokinesia extending into the apex.  Left ventricular ejection fraction of 35%. Cath deferred as she was not candidate for CABG (due to COPD) and not having angina to warrant PCI if lesion was found. Opted for medical therapy.   Recently admitted for CP and progressive dyspnea and found to be in respiratory failure requiring intubation x2.  She diuresed 25 pounds on IV lasix and nesiritide.  Repeat echo 10/16 did not show the RV well but EF was 20-25% with global hypokinesis. (RV not terrible. Probably moderately dilated and mildly HK).  Underwent LHC on 10/15 showing minimal nonobstructive CAD.  She was discharged to [facility] for ~2weeks.  10/26/11 LHC: Left main: Normal, LAD: Large vessel wrapped the apex. Diffuse 20-30% stenosis in mid to distal vessel, LCX: Normal, RCA: Large dominant vessel. Normal  She returns for post hospital follow up.  Currently resides at [facility] for rehab.  States her breathing ok with rehab but she has noticed increased lower extremity edema.  Weight is steadily climbing from 240s to 255 pounds.  She states her weight went up 3 pounds over night.  She has chronic 2 pillow orthopnea.  No PND.  Wears O2 continuously.    Labs: 12/10/11: K 4.4, Cl 103, CO2 32, Cr 1.53  ROS: All systems negative except as listed in HPI, PMH and Problem List.  Past Medical History  Diagnosis Date  . RENAL  INSUFFICIENCY 10/15/2008  . Edema 06/18/2008  . PNEUMONIA 12/01/2007  . CARPAL TUNNEL SYNDROME, BILATERAL 10/30/2007  . HAND PAIN, LEFT 09/04/2007  . Morbid obesity   . ANXIETY 11/08/2006  . HYPERTENSION 11/08/2006  . DIABETES MELLITUS, TYPE II 11/08/2006  . HYPERCHOLESTEROLEMIA 07/24/2009  . ANEMIA 07/24/2009  . COPD 11/08/2006    -HFA 50% p coaching 10/15/2009  . RESPIRATORY FAILURE, CHRONIC 10/15/2009  . BACK PAIN, LUMBAR 03/07/2009  . CHEST PAIN 07/24/2009  . Restless leg syndrome   . Systolic CHF, acute 03/24/2011  . Ischemic cardiomyopathy 03/26/2011    Current Outpatient Prescriptions  Medication Sig Dispense Refill  . acetaminophen (TYLENOL) 500 MG tablet Take 1,000 mg by mouth every 6 (six) hours as needed. For pain      . acetaZOLAMIDE (DIAMOX) 125 MG tablet Take 125 mg by mouth 2 (two) times daily.      Marland Kitchen ALPRAZolam (XANAX) 0.25 MG tablet Take 1 tablet (0.25 mg total) by mouth 2 (two) times daily as needed for sleep.  60 tablet  2  . calcitRIOL (ROCALTROL) 0.25 MCG capsule Take 0.25 mcg by mouth daily.        Marland Kitchen dexamethasone (DECADRON) 4 MG tablet Take 4 mg by mouth daily with breakfast.      . DULoxetine (CYMBALTA) 30 MG capsule Take 30 mg by mouth daily.      . ferrous sulfate 325 (65 FE) MG tablet Take 325 mg by mouth daily with breakfast.      .  ipratropium-albuterol (DUONEB) 0.5-2.5 (3) MG/3ML SOLN Take 3 mLs by nebulization.      . mirtazapine (REMERON) 15 MG tablet Take 15 mg by mouth at bedtime.      . Multiple Vitamin (MULTIVITAMIN) tablet Take 1 tablet by mouth daily.       . polyethylene glycol (MIRALAX / GLYCOLAX) packet Take 17 g by mouth daily.      . Rivaroxaban (XARELTO) 15 MG TABS tablet Take 15 mg by mouth daily.      Marland Kitchen saccharomyces boulardii (FLORASTOR) 250 MG capsule Take 250 mg by mouth 2 (two) times daily.      Marland Kitchen tiotropium (SPIRIVA) 18 MCG inhalation capsule Place 18 mcg into inhaler and inhale daily.      Marland Kitchen torsemide (DEMADEX) 10 MG tablet Take 10 mg by  mouth 2 (two) times daily.      Marland Kitchen acetaZOLAMIDE (DIAMOX) 250 MG tablet Take 2 tablets (500 mg total) by mouth daily at 3 pm.  30 tablet  0  . acetaZOLAMIDE (DIAMOX) 500 MG capsule Take 1 capsule (500 mg total) by mouth every 12 (twelve) hours.      Marland Kitchen albuterol (PROVENTIL HFA;VENTOLIN HFA) 108 (90 BASE) MCG/ACT inhaler Inhale 2 puffs into the lungs every 6 (six) hours as needed. For wheeze or shortness of breath      . albuterol (PROVENTIL) (2.5 MG/3ML) 0.083% nebulizer solution Take 2.5 mg by nebulization every 6 (six) hours as needed. For shortness of breath      . amiodarone (PACERONE) 200 MG tablet Take 1 tablet (200 mg total) by mouth 2 (two) times daily.      Marland Kitchen apixaban (ELIQUIS) 5 MG TABS tablet Take 1 tablet (5 mg total) by mouth 2 (two) times daily.  20 tablet    . budesonide-formoterol (SYMBICORT) 160-4.5 MCG/ACT inhaler Inhale 2 puffs into the lungs 2 (two) times daily.      . citalopram (CELEXA) 40 MG tablet Take 0.5 tablets (20 mg total) by mouth daily.  15 tablet  2  . guaiFENesin (MUCINEX) 600 MG 12 hr tablet Take 2 tablets (1,200 mg total) by mouth 2 (two) times daily.  6 tablet  0  . insulin aspart protamine-insulin aspart (NOVOLOG 70/30) (70-30) 100 UNIT/ML injection Inject 30 Units into the skin 2 (two) times daily with a meal.  60 mL  1  . insulin aspart protamine-insulin aspart (NOVOLOG 70/30) (70-30) 100 UNIT/ML injection Inject 25 Units into the skin 2 (two) times daily with a meal.  10 mL    . predniSONE (DELTASONE) 10 MG tablet Take 4 tabs daily for 3 days, then 3 tabs daily for 3 days, then 2 tabs daily for 3 days, then 1 tab daily for 3 days and stop      . sodium chloride 0.9 % infusion Inject 250 mLs into the vein as needed (if IV carrier fluid needed.).      . [DISCONTINUED] cloNIDine (CATAPRES) 0.1 MG tablet Take 0.1 mg by mouth 3 (three) times daily.       . [DISCONTINUED] famotidine (PEPCID) 20 MG tablet Take 20 mg by mouth daily as needed. For heartburn.          PHYSICAL EXAM: Filed Vitals:   12/13/11 1455  BP: 148/62  Pulse: 106  Weight: 257 lb 6.4 oz (116.756 kg)  SpO2: 94%    General:  Well appearing. No resp difficulty HEENT: normal Neck: supple. JVP difficult to see. Carotids 2+ bilaterally; no bruits. No lymphadenopathy or thryomegaly appreciated. Cor:  PMI normal. Regular rate & rhythm. No rubs, gallops or murmurs. Lungs: crackle LLB Abdomen: obese, soft, nontender, nondistended. No hepatosplenomegaly. No bruits or masses. Good bowel sounds. Extremities: no cyanosis, clubbing, rash, 2+ LLE with trace on Rt edema  Neuro: alert & orientedx3, cranial nerves grossly intact. Moves all 4 extremities w/o difficulty. Affect pleasant.    ASSESSMENT & PLAN:

## 2011-12-13 NOTE — Assessment & Plan Note (Signed)
Volume status climbing on torsemide 10 mg BID therefore will increase to 20 mg BID.  Have discussed dietary restrictions and fluid restrictions.  She is eating a lot of ice and will try to cut back and use sugar-free candy.  Contraction alkalosis has resolved on current labs therefore will stop acetazolamide, follow labs closely.  Can consider adding bisoprolol at follow up although have held off on beta blocker in the past due to severe COPD, will discuss further at follow up.  Follow up 2 weeks.

## 2011-12-13 NOTE — Assessment & Plan Note (Signed)
Renal function stable.  Will recheck at follow up.

## 2011-12-20 ENCOUNTER — Other Ambulatory Visit: Payer: Self-pay | Admitting: Internal Medicine

## 2011-12-20 DIAGNOSIS — R600 Localized edema: Secondary | ICD-10-CM

## 2011-12-20 DIAGNOSIS — R1032 Left lower quadrant pain: Secondary | ICD-10-CM

## 2011-12-22 ENCOUNTER — Ambulatory Visit (HOSPITAL_COMMUNITY)
Admission: RE | Admit: 2011-12-22 | Discharge: 2011-12-22 | Disposition: A | Discharge status: Home / Self Care | Payer: Medicare Other | Source: Ambulatory Visit | Attending: Internal Medicine | Admitting: Internal Medicine

## 2011-12-22 ENCOUNTER — Other Ambulatory Visit: Payer: Self-pay | Admitting: Internal Medicine

## 2011-12-22 DIAGNOSIS — R1032 Left lower quadrant pain: Secondary | ICD-10-CM

## 2011-12-22 DIAGNOSIS — M545 Low back pain, unspecified: Secondary | ICD-10-CM | POA: Insufficient documentation

## 2011-12-22 DIAGNOSIS — R600 Localized edema: Secondary | ICD-10-CM

## 2011-12-22 DIAGNOSIS — R609 Edema, unspecified: Secondary | ICD-10-CM | POA: Insufficient documentation

## 2011-12-24 ENCOUNTER — Ambulatory Visit (HOSPITAL_COMMUNITY)
Admission: RE | Admit: 2011-12-24 | Discharge: 2011-12-24 | Disposition: A | Discharge status: Home / Self Care | Payer: Medicare Other | Source: Ambulatory Visit | Attending: Internal Medicine | Admitting: Internal Medicine

## 2011-12-24 VITALS — BP 146/72 | HR 105 | Wt 260.5 lb

## 2011-12-24 DIAGNOSIS — I509 Heart failure, unspecified: Secondary | ICD-10-CM | POA: Insufficient documentation

## 2011-12-24 DIAGNOSIS — I5022 Chronic systolic (congestive) heart failure: Secondary | ICD-10-CM | POA: Insufficient documentation

## 2011-12-24 MED ORDER — BISOPROLOL FUMARATE 5 MG PO TABS: 2.5000 mg | ORAL_TABLET | Freq: Every day | ORAL | Status: DC

## 2011-12-24 NOTE — Assessment & Plan Note (Addendum)
NYHA III. Volume status stable. Continue current diuretic regimen. SNF to check BMET and fax results. Add cardioselective beta blocker given COPD. Add bisoprolol 2.5 mg daily. Add compression stockings. Given liberalized diet and fluid intake I have reinforced limiting fluid intake to less than 2 liters per day and low salt food choices. Follow up in 2 weeks to reassess volume status. Consider uptitrating bisoprolol to 5 mg daily.

## 2011-12-24 NOTE — Progress Notes (Signed)
Patient ID: [name], female   DOB: [birth year], 66 y.o.   MRN: [id number]  Weight Range      Baseline proBNP   1715    HPI: 66 y/o woman with multiple medical problems including morbid obesity, COPD on 4L home O2, DM2, HTN and HL.  Systolic HF secondary to NICM with EF 20-25% and CRI with baseline Cr 1.5. She has had multiple admissions for respiratory failure.   Echo 03/24/11: LVEF 30-35% with mild concentric hypertrophy.  Mild MR.  LA mildly dilated.  Myoview 03/25/11:  No evidence for inducible ischemia. Moderate inferolateral and small distal anteroseptal fixed defects are identified and may be related to scar.  Inferior and mid/distal inferolateral hypokinesia extending into the apex.  Left ventricular ejection fraction of 35%. Cath deferred as she was not candidate for CABG (due to COPD) and not having angina to warrant PCI if lesion was found. Opted for medical therapy.   Recently admitted for CP and progressive dyspnea and found to be in respiratory failure requiring intubation x2.  She diuresed 25 pounds on IV lasix and nesiritide.  Repeat echo 10/16 did not show the RV well but EF was 20-25% with global hypokinesis. (RV not terrible. Probably moderately dilated and mildly HK).  Underwent LHC on 10/15 showing minimal nonobstructive CAD.  She was discharged to [facility] for ~2weeks.  10/26/11 LHC: Left main: Normal, LAD: Large vessel wrapped the apex. Diffuse 20-30% stenosis in mid to distal vessel, LCX: Normal, RCA: Large dominant vessel. Normal  Labs: 12/10/11: K 4.4, Cl 103, CO2 32, Cr 1.53  First week of December. R and LLE Doppler completed at SNF  - Negative for DVT  She returns for follow up. Last visit torsemide increased to 20 mg bid. Dr Chilton Si added Spironolactone 25 mg daily. Exertional SOB but at rest dyspnea resolves. + Orthopnea (sleeps on 2 pillows). No PND. Wears 2 liters [place] continuously. Resides at [facility]. Ambulates with rolling walker. Weight at  SNF up and down. [phone]. Complaint with medications. Drinking > 2 liters. Eat high salt food such as soup every other day.      ROS: All systems negative except as listed in HPI, PMH and Problem List.  Past Medical History  Diagnosis Date  . RENAL INSUFFICIENCY 10/15/2008  . Edema 06/18/2008  . PNEUMONIA 12/01/2007  . CARPAL TUNNEL SYNDROME, BILATERAL 10/30/2007  . HAND PAIN, LEFT 09/04/2007  . Morbid obesity   . ANXIETY 11/08/2006  . HYPERTENSION 11/08/2006  . DIABETES MELLITUS, TYPE II 11/08/2006  . HYPERCHOLESTEROLEMIA 07/24/2009  . ANEMIA 07/24/2009  . COPD 11/08/2006    -HFA 50% p coaching 10/15/2009  . RESPIRATORY FAILURE, CHRONIC 10/15/2009  . BACK PAIN, LUMBAR 03/07/2009  . CHEST PAIN 07/24/2009  . Restless leg syndrome   . Systolic CHF, acute 03/24/2011  . Ischemic cardiomyopathy 03/26/2011    Current Outpatient Prescriptions  Medication Sig Dispense Refill  . acetaminophen (TYLENOL) 500 MG tablet Take 1,000 mg by mouth every 6 (six) hours as needed. For pain      . ALPRAZolam (XANAX) 0.25 MG tablet Take 1 tablet (0.25 mg total) by mouth 2 (two) times daily as needed for sleep.  60 tablet  2  . budesonide-formoterol (SYMBICORT) 160-4.5 MCG/ACT inhaler Inhale 2 puffs into the lungs 2 (two) times daily.      . calcitRIOL (ROCALTROL) 0.25 MCG capsule Take 0.25 mcg by mouth daily.        Marland Kitchen dexamethasone (DECADRON) 4 MG tablet Take  4 mg by mouth daily with breakfast.      . DULoxetine (CYMBALTA) 30 MG capsule Take 30 mg by mouth daily.      . ferrous sulfate 325 (65 FE) MG tablet Take 325 mg by mouth daily with breakfast.      . insulin aspart protamine-insulin aspart (NOVOLOG 70/30) (70-30) 100 UNIT/ML injection Inject 35 Units into the skin 2 (two) times daily with a meal.      . ipratropium-albuterol (DUONEB) 0.5-2.5 (3) MG/3ML SOLN Take 3 mLs by nebulization.      . mirtazapine (REMERON) 15 MG tablet Take 15 mg by mouth at bedtime.      . Multiple Vitamin (MULTIVITAMIN) tablet  Take 1 tablet by mouth daily.       . polyethylene glycol (MIRALAX / GLYCOLAX) packet Take 17 g by mouth daily.      . Rivaroxaban (XARELTO) 15 MG TABS tablet Take 15 mg by mouth daily.      Marland Kitchen saccharomyces boulardii (FLORASTOR) 250 MG capsule Take 250 mg by mouth 2 (two) times daily.      Marland Kitchen spironolactone (ALDACTONE) 25 MG tablet Take 25 mg by mouth daily.      Marland Kitchen tiotropium (SPIRIVA) 18 MCG inhalation capsule Place 18 mcg into inhaler and inhale daily.      Marland Kitchen torsemide (DEMADEX) 20 MG tablet Take 20 mg by mouth 2 (two) times daily.      Marland Kitchen acetaZOLAMIDE (DIAMOX) 250 MG tablet Take 2 tablets (500 mg total) by mouth daily at 3 pm.  30 tablet  0  . [DISCONTINUED] cloNIDine (CATAPRES) 0.1 MG tablet Take 0.1 mg by mouth 3 (three) times daily.       . [DISCONTINUED] famotidine (PEPCID) 20 MG tablet Take 20 mg by mouth daily as needed. For heartburn.         PHYSICAL EXAM: Filed Vitals:   12/24/11 1012  BP: 146/72  Pulse: 105  Weight: 260 lb 8 oz (118.162 kg)  SpO2: 97%    General:  Chronically ill appearing. No resp difficulty HEENT: normal Neck: supple. JVP difficult to see. Carotids 2+ bilaterally; no bruits. No lymphadenopathy or thryomegaly appreciated. Cor: PMI normal. Regular rate & rhythm. No rubs, gallops or murmurs. Lungs: RLL decreased on 2 liters [place] Abdomen: obese, soft, nontender, nondistended. No hepatosplenomegaly. No bruits or masses. Good bowel sounds. Extremities: no cyanosis, clubbing, rash, 2+ LLE with trace on Rt edema  Neuro: alert & orientedx3, cranial nerves grossly intact. Moves all 4 extremities w/o difficulty. Affect pleasant.    ASSESSMENT & PLAN:

## 2012-01-07 ENCOUNTER — Ambulatory Visit (HOSPITAL_COMMUNITY): Payer: Medicare Other | Attending: Internal Medicine

## 2012-01-11 ENCOUNTER — Ambulatory Visit (HOSPITAL_COMMUNITY): Payer: Medicare Other

## 2012-01-19 ENCOUNTER — Inpatient Hospital Stay (HOSPITAL_COMMUNITY): Payer: Medicare Other

## 2012-01-19 ENCOUNTER — Emergency Department (HOSPITAL_COMMUNITY): Payer: Medicare Other

## 2012-01-19 ENCOUNTER — Encounter (HOSPITAL_COMMUNITY): Payer: Self-pay

## 2012-01-19 ENCOUNTER — Inpatient Hospital Stay (HOSPITAL_COMMUNITY)
Admission: EM | Admit: 2012-01-19 | Discharge: 2012-01-22 | DRG: 871 | Disposition: A | Discharge status: Skilled Nursing Facility | Payer: Medicare Other | Attending: Internal Medicine | Admitting: Internal Medicine

## 2012-01-19 DIAGNOSIS — Z6839 Body mass index (BMI) 39.0-39.9, adult: Secondary | ICD-10-CM

## 2012-01-19 DIAGNOSIS — A419 Sepsis, unspecified organism: Secondary | ICD-10-CM | POA: Diagnosis present

## 2012-01-19 DIAGNOSIS — R05 Cough: Secondary | ICD-10-CM

## 2012-01-19 DIAGNOSIS — E1029 Type 1 diabetes mellitus with other diabetic kidney complication: Secondary | ICD-10-CM

## 2012-01-19 DIAGNOSIS — I5023 Acute on chronic systolic (congestive) heart failure: Secondary | ICD-10-CM | POA: Diagnosis present

## 2012-01-19 DIAGNOSIS — I10 Essential (primary) hypertension: Secondary | ICD-10-CM | POA: Diagnosis present

## 2012-01-19 DIAGNOSIS — E7800 Pure hypercholesterolemia, unspecified: Secondary | ICD-10-CM | POA: Diagnosis present

## 2012-01-19 DIAGNOSIS — N183 Chronic kidney disease, stage 3 unspecified: Secondary | ICD-10-CM | POA: Diagnosis present

## 2012-01-19 DIAGNOSIS — I509 Heart failure, unspecified: Secondary | ICD-10-CM | POA: Diagnosis present

## 2012-01-19 DIAGNOSIS — J449 Chronic obstructive pulmonary disease, unspecified: Secondary | ICD-10-CM | POA: Diagnosis present

## 2012-01-19 DIAGNOSIS — Z8701 Personal history of pneumonia (recurrent): Secondary | ICD-10-CM

## 2012-01-19 DIAGNOSIS — I255 Ischemic cardiomyopathy: Secondary | ICD-10-CM

## 2012-01-19 DIAGNOSIS — J9611 Chronic respiratory failure with hypoxia: Secondary | ICD-10-CM | POA: Diagnosis present

## 2012-01-19 DIAGNOSIS — J9610 Chronic respiratory failure, unspecified whether with hypoxia or hypercapnia: Secondary | ICD-10-CM

## 2012-01-19 DIAGNOSIS — J4489 Other specified chronic obstructive pulmonary disease: Secondary | ICD-10-CM | POA: Diagnosis present

## 2012-01-19 DIAGNOSIS — F411 Generalized anxiety disorder: Secondary | ICD-10-CM | POA: Diagnosis present

## 2012-01-19 DIAGNOSIS — G2581 Restless legs syndrome: Secondary | ICD-10-CM | POA: Diagnosis present

## 2012-01-19 DIAGNOSIS — E1129 Type 2 diabetes mellitus with other diabetic kidney complication: Secondary | ICD-10-CM | POA: Diagnosis present

## 2012-01-19 DIAGNOSIS — I4891 Unspecified atrial fibrillation: Secondary | ICD-10-CM | POA: Diagnosis present

## 2012-01-19 DIAGNOSIS — J189 Pneumonia, unspecified organism: Secondary | ICD-10-CM | POA: Diagnosis present

## 2012-01-19 DIAGNOSIS — N058 Unspecified nephritic syndrome with other morphologic changes: Secondary | ICD-10-CM | POA: Diagnosis present

## 2012-01-19 DIAGNOSIS — I5022 Chronic systolic (congestive) heart failure: Secondary | ICD-10-CM

## 2012-01-19 DIAGNOSIS — Z794 Long term (current) use of insulin: Secondary | ICD-10-CM

## 2012-01-19 DIAGNOSIS — E119 Type 2 diabetes mellitus without complications: Secondary | ICD-10-CM

## 2012-01-19 DIAGNOSIS — Z87891 Personal history of nicotine dependence: Secondary | ICD-10-CM

## 2012-01-19 DIAGNOSIS — I129 Hypertensive chronic kidney disease with stage 1 through stage 4 chronic kidney disease, or unspecified chronic kidney disease: Secondary | ICD-10-CM | POA: Diagnosis present

## 2012-01-19 DIAGNOSIS — D638 Anemia in other chronic diseases classified elsewhere: Secondary | ICD-10-CM | POA: Diagnosis present

## 2012-01-19 DIAGNOSIS — I2589 Other forms of chronic ischemic heart disease: Secondary | ICD-10-CM | POA: Diagnosis present

## 2012-01-19 DIAGNOSIS — R911 Solitary pulmonary nodule: Secondary | ICD-10-CM | POA: Diagnosis present

## 2012-01-19 HISTORY — DX: Unspecified atrial fibrillation: I48.91

## 2012-01-19 LAB — BASIC METABOLIC PANEL
BUN: 53 mg/dL — ABNORMAL HIGH (ref 6–23)
CO2: 25 mEq/L (ref 19–32)
Chloride: 95 mEq/L — ABNORMAL LOW (ref 96–112)
Creatinine, Ser: 1.83 mg/dL — ABNORMAL HIGH (ref 0.50–1.10)
Glucose, Bld: 343 mg/dL — ABNORMAL HIGH (ref 70–99)

## 2012-01-19 LAB — CBC WITH DIFFERENTIAL/PLATELET
Eosinophils Relative: 0 % (ref 0–5)
HCT: 33.7 % — ABNORMAL LOW (ref 36.0–46.0)
Hemoglobin: 10.8 g/dL — ABNORMAL LOW (ref 12.0–15.0)
Lymphocytes Relative: 8 % — ABNORMAL LOW (ref 12–46)
Lymphs Abs: 0.9 10*3/uL (ref 0.7–4.0)
MCV: 87.1 fL (ref 78.0–100.0)
Monocytes Absolute: 0.6 10*3/uL (ref 0.1–1.0)
Monocytes Relative: 6 % (ref 3–12)
Neutro Abs: 9.2 10*3/uL — ABNORMAL HIGH (ref 1.7–7.7)
WBC: 10.7 10*3/uL — ABNORMAL HIGH (ref 4.0–10.5)

## 2012-01-19 LAB — GLUCOSE, CAPILLARY: Glucose-Capillary: 381 mg/dL — ABNORMAL HIGH (ref 70–99)

## 2012-01-19 LAB — URINALYSIS, ROUTINE W REFLEX MICROSCOPIC
Bilirubin Urine: NEGATIVE
Glucose, UA: 250 mg/dL — AB
Ketones, ur: NEGATIVE mg/dL
Leukocytes, UA: NEGATIVE
pH: 5 (ref 5.0–8.0)

## 2012-01-19 LAB — POCT I-STAT, CHEM 8
Creatinine, Ser: 2.1 mg/dL — ABNORMAL HIGH (ref 0.50–1.10)
HCT: 35 % — ABNORMAL LOW (ref 36.0–46.0)
Hemoglobin: 11.9 g/dL — ABNORMAL LOW (ref 12.0–15.0)
Sodium: 135 mEq/L (ref 135–145)
TCO2: 27 mmol/L (ref 0–100)

## 2012-01-19 LAB — URINE MICROSCOPIC-ADD ON

## 2012-01-19 MED ORDER — ACETAMINOPHEN 325 MG PO TABS: 974.0000 mg | ORAL_TABLET | Freq: Once | ORAL | Status: DC

## 2012-01-19 MED ORDER — LEVOFLOXACIN IN D5W 750 MG/150ML IV SOLN
750.0000 mg | INTRAVENOUS | Status: DC
Start: 2012-01-19 — End: 2012-01-22
  Administered 2012-01-20: 750 mg via INTRAVENOUS
  Filled 2012-01-19 (×2): qty 150

## 2012-01-19 MED ORDER — SODIUM CHLORIDE 0.9 % IV BOLUS (SEPSIS)
1000.0000 mL | Freq: Once | INTRAVENOUS | Status: AC
  Administered 2012-01-19: 1000 mL via INTRAVENOUS

## 2012-01-19 MED ORDER — VANCOMYCIN HCL 10 G IV SOLR
2500.0000 mg | Freq: Once | INTRAVENOUS | Status: AC
  Administered 2012-01-19: 2500 mg via INTRAVENOUS
  Filled 2012-01-19: qty 2500

## 2012-01-19 MED ORDER — RIVAROXABAN 15 MG PO TABS
15.0000 mg | ORAL_TABLET | Freq: Every day | ORAL | Status: DC
  Administered 2012-01-19 – 2012-01-21 (×3): 15 mg via ORAL
  Filled 2012-01-19 (×4): qty 1

## 2012-01-19 MED ORDER — DEXAMETHASONE 4 MG PO TABS
4.0000 mg | ORAL_TABLET | Freq: Every day | ORAL | Status: DC
  Administered 2012-01-20 – 2012-01-22 (×3): 4 mg via ORAL
  Filled 2012-01-19 (×4): qty 1

## 2012-01-19 MED ORDER — LEVOFLOXACIN IN D5W 750 MG/150ML IV SOLN: 750.0000 mg | INTRAVENOUS | Status: DC

## 2012-01-19 MED ORDER — CALCITRIOL 0.25 MCG PO CAPS
0.2500 ug | ORAL_CAPSULE | Freq: Every day | ORAL | Status: DC
  Administered 2012-01-19 – 2012-01-22 (×4): 0.25 ug via ORAL
  Filled 2012-01-19 (×4): qty 1

## 2012-01-19 MED ORDER — ADULT MULTIVITAMIN W/MINERALS CH
1.0000 | ORAL_TABLET | Freq: Every day | ORAL | Status: DC
  Administered 2012-01-19 – 2012-01-22 (×4): 1 via ORAL
  Filled 2012-01-19 (×4): qty 1

## 2012-01-19 MED ORDER — ACETAMINOPHEN 325 MG PO TABS
975.0000 mg | ORAL_TABLET | Freq: Once | ORAL | Status: AC
  Administered 2012-01-19: 975 mg via ORAL
  Filled 2012-01-19: qty 3

## 2012-01-19 MED ORDER — TORSEMIDE 20 MG PO TABS
20.0000 mg | ORAL_TABLET | Freq: Two times a day (BID) | ORAL | Status: DC
  Administered 2012-01-20 – 2012-01-22 (×5): 20 mg via ORAL
  Filled 2012-01-19 (×7): qty 1

## 2012-01-19 MED ORDER — ALPRAZOLAM 0.25 MG PO TABS
0.2500 mg | ORAL_TABLET | Freq: Two times a day (BID) | ORAL | Status: DC | PRN
  Administered 2012-01-20 – 2012-01-21 (×2): 0.25 mg via ORAL
  Filled 2012-01-19 (×2): qty 1

## 2012-01-19 MED ORDER — FERROUS SULFATE 325 (65 FE) MG PO TABS
325.0000 mg | ORAL_TABLET | Freq: Every day | ORAL | Status: DC
  Administered 2012-01-20 – 2012-01-22 (×3): 325 mg via ORAL
  Filled 2012-01-19 (×4): qty 1

## 2012-01-19 MED ORDER — SODIUM CHLORIDE 0.9 % IV SOLN
INTRAVENOUS | Status: DC
  Administered 2012-01-19: 3.2 [IU]/h via INTRAVENOUS
  Administered 2012-01-20: 2.3 [IU]/h via INTRAVENOUS
  Administered 2012-01-20: 2.8 [IU]/h via INTRAVENOUS
  Filled 2012-01-19: qty 1

## 2012-01-19 MED ORDER — DULOXETINE HCL 30 MG PO CPEP
30.0000 mg | ORAL_CAPSULE | Freq: Every day | ORAL | Status: DC
  Administered 2012-01-19 – 2012-01-22 (×4): 30 mg via ORAL
  Filled 2012-01-19 (×4): qty 1

## 2012-01-19 MED ORDER — DEXTROSE-NACL 5-0.45 % IV SOLN
INTRAVENOUS | Status: DC
  Administered 2012-01-20: 02:00:00 via INTRAVENOUS

## 2012-01-19 MED ORDER — DEXTROSE 5 % IV SOLN
1.0000 g | Freq: Two times a day (BID) | INTRAVENOUS | Status: DC
  Administered 2012-01-19 – 2012-01-21 (×4): 1 g via INTRAVENOUS
  Filled 2012-01-19 (×5): qty 1

## 2012-01-19 MED ORDER — SODIUM CHLORIDE 0.9 % IV SOLN
INTRAVENOUS | Status: DC
  Administered 2012-01-19: 23:00:00 via INTRAVENOUS

## 2012-01-19 MED ORDER — ACETAMINOPHEN 325 MG PO TABS
650.0000 mg | ORAL_TABLET | Freq: Four times a day (QID) | ORAL | Status: DC | PRN
  Administered 2012-01-20 (×2): 325 mg via ORAL
  Filled 2012-01-19: qty 2
  Filled 2012-01-19: qty 1

## 2012-01-19 MED ORDER — BUDESONIDE-FORMOTEROL FUMARATE 160-4.5 MCG/ACT IN AERO
2.0000 | INHALATION_SPRAY | Freq: Two times a day (BID) | RESPIRATORY_TRACT | Status: DC
  Administered 2012-01-20 – 2012-01-22 (×5): 2 via RESPIRATORY_TRACT
  Filled 2012-01-19: qty 6

## 2012-01-19 MED ORDER — ALBUTEROL SULFATE HFA 108 (90 BASE) MCG/ACT IN AERS: 2.0000 | INHALATION_SPRAY | Freq: Four times a day (QID) | RESPIRATORY_TRACT | Status: DC | PRN

## 2012-01-19 MED ORDER — INSULIN REGULAR BOLUS VIA INFUSION
0.0000 [IU] | Freq: Three times a day (TID) | INTRAVENOUS | Status: DC
  Filled 2012-01-19: qty 10

## 2012-01-19 MED ORDER — SPIRONOLACTONE 25 MG PO TABS
25.0000 mg | ORAL_TABLET | Freq: Every day | ORAL | Status: DC
  Administered 2012-01-20 – 2012-01-22 (×3): 25 mg via ORAL
  Filled 2012-01-19 (×3): qty 1

## 2012-01-19 MED ORDER — DEXTROSE 50 % IV SOLN: 25.0000 mL | INTRAVENOUS | Status: DC | PRN

## 2012-01-19 MED ORDER — ONE-DAILY MULTI VITAMINS PO TABS: 1.0000 | ORAL_TABLET | Freq: Every day | ORAL | Status: DC

## 2012-01-19 MED ORDER — TIOTROPIUM BROMIDE MONOHYDRATE 18 MCG IN CAPS
18.0000 ug | ORAL_CAPSULE | Freq: Every day | RESPIRATORY_TRACT | Status: DC
  Administered 2012-01-20 – 2012-01-22 (×3): 18 ug via RESPIRATORY_TRACT
  Filled 2012-01-19: qty 5

## 2012-01-19 MED ORDER — VANCOMYCIN HCL 10 G IV SOLR
1500.0000 mg | INTRAVENOUS | Status: DC
  Administered 2012-01-20 – 2012-01-21 (×2): 1500 mg via INTRAVENOUS
  Filled 2012-01-19 (×3): qty 1500

## 2012-01-19 NOTE — ED Notes (Signed)
Pts complains of back pain. EMS noticed decreased oxygen saturation of 80% on 2lnc, placed on 6lnc increased to 90% reported.  Blood glucose of 320.  Pt has not had insulin or food today.  Daughter reports foul smelling urine for 3 days.  Pt brought home from nursing home a few days before Christmas 2013.

## 2012-01-19 NOTE — ED Notes (Signed)
Patient transported to X-ray 

## 2012-01-19 NOTE — Progress Notes (Signed)
ANTIBIOTIC CONSULT NOTE - INITIAL  Pharmacy Consult for Vancomycin/cefepime/levaquin Indication: HCAP  Allergies  Allergen Reactions  . Pioglitazone     REACTION: edema  . Rosiglitazone Maleate Nausea And Vomiting    Patient Measurements: Height: 5' 6.14" (168 cm) Weight: 260 lb 9.3 oz (118.2 kg) IBW/kg (Calculated) : 59.63   Vital Signs: Temp: 103 F (39.4 C) (01/08 1449) Temp src: Rectal (01/08 1449) BP: 124/51 mmHg (01/08 1555) Pulse Rate: 115  (01/08 1555) Intake/Output from previous day:   Intake/Output from this shift:    Labs:  Basename 01/19/12 1615 01/19/12 1530  WBC -- 10.7*  HGB 11.9* 10.8*  PLT -- 159  LABCREA -- --  CREATININE 2.10* 1.83*   Estimated Creatinine Clearance: 34.5 ml/min (by C-G formula based on Cr of 2.1). No results found for this basename: VANCOTROUGH:2,VANCOPEAK:2,VANCORANDOM:2,GENTTROUGH:2,GENTPEAK:2,GENTRANDOM:2,TOBRATROUGH:2,TOBRAPEAK:2,TOBRARND:2,AMIKACINPEAK:2,AMIKACINTROU:2,AMIKACIN:2, in the last 72 hours   Microbiology: No results found for this or any previous visit (from the past 720 hour(s)).  Medical History: Past Medical History  Diagnosis Date  . RENAL INSUFFICIENCY 10/15/2008  . Edema 06/18/2008  . PNEUMONIA 12/01/2007  . CARPAL TUNNEL SYNDROME, BILATERAL 10/30/2007  . HAND PAIN, LEFT 09/04/2007  . Morbid obesity   . ANXIETY 11/08/2006  . HYPERTENSION 11/08/2006  . DIABETES MELLITUS, TYPE II 11/08/2006  . HYPERCHOLESTEROLEMIA 07/24/2009  . ANEMIA 07/24/2009  . COPD 11/08/2006    -HFA 50% p coaching 10/15/2009  . RESPIRATORY FAILURE, CHRONIC 10/15/2009  . BACK PAIN, LUMBAR 03/07/2009  . CHEST PAIN 07/24/2009  . Restless leg syndrome   . Systolic CHF, acute 03/24/2011  . Ischemic cardiomyopathy 03/26/2011    Medications:   (Not in a hospital admission) Assessment: 67 y/o female patient admitted with shortness of breath, requiring broad spectrum antibiotics for HCAP. Noted CRI, will renally adjust  antibiotics.  Goal of Therapy:  Vancomycin trough level 15-20 mcg/ml  Plan:  Vancomycin 2500mg  IV x1 then 1500mg  IV q24h, change cefepime to 1g IV q12h, levaquin 750mg  IV q48h. Monitor renal function and f/u c&s. Measure antibiotic drug levels at steady state  Verlene Mayer, PharmD, [place] Pager [phone] 01/19/2012,5:39 PM

## 2012-01-19 NOTE — H&P (Signed)
[facility] History and Physical  [name] ZOX:[id number] DOB: [birth year] DOA: 01/19/2012  Referring physician: ED PCP: Romero Belling, MD  Specialists: None  Chief Complaint: Back pain, SOB, cough  HPI: [name] is a 67 y.o. female who presents with complaint of back pain, wet sounding cough, mild SOB, dysuria, all onset 3 days ago.  Back pain is worse with movement, no abdominal pain.  Nothing seems to make her back pain better.  She has also been running a fever.  She does have a recent history of HCAP requiring intubation back in October of this year.  In the ED patient was found to have LLL lingula PNA, HCAP given that she is from a nursing home.  There is also an ill-defined density in the RUL concerning for mass (CT recommended).  Patient also had fever of 103, leukocytosis, creatinine slightly elevated from her baseline of 1.8 (was 2.1).  Tachycardia, and mild tachypnea which improved with O2.  Hospitalist has been asked to admit for HCAP with sepsis.  Review of Systems: 12 systems reviewed and otherwise negative.  Past Medical History  Diagnosis Date  . RENAL INSUFFICIENCY 10/15/2008  . Edema 06/18/2008  . PNEUMONIA 12/01/2007  . CARPAL TUNNEL SYNDROME, BILATERAL 10/30/2007  . HAND PAIN, LEFT 09/04/2007  . Morbid obesity   . ANXIETY 11/08/2006  . HYPERTENSION 11/08/2006  . DIABETES MELLITUS, TYPE II 11/08/2006  . HYPERCHOLESTEROLEMIA 07/24/2009  . ANEMIA 07/24/2009  . COPD 11/08/2006    -HFA 50% p coaching 10/15/2009  . RESPIRATORY FAILURE, CHRONIC 10/15/2009  . BACK PAIN, LUMBAR 03/07/2009  . CHEST PAIN 07/24/2009  . Restless leg syndrome   . Systolic CHF, acute 03/24/2011  . Ischemic cardiomyopathy 03/26/2011   Past Surgical History  Procedure Date  . Abdominal hysterectomy 1987   Social History:  reports that she quit smoking about 4 years ago. She does not have any smokeless tobacco history on file. She reports that she does not drink alcohol or use illicit  drugs.   Allergies  Allergen Reactions  . Pioglitazone     REACTION: edema  . Rosiglitazone Maleate Nausea And Vomiting    Family History  Problem Relation Age of Onset  . Heart disease Mother   . Pancreatic cancer Mother   . Stroke Mother   . Asthma Paternal Grandfather   . Liver cancer Father   . Diabetes Father     Prior to Admission medications   Medication Sig Start Date End Date Taking? Authorizing Provider  acetaminophen (TYLENOL) 500 MG tablet Take 1,000 mg by mouth every 6 (six) hours as needed. For pain   Yes Historical Provider, MD  albuterol (PROVENTIL HFA;VENTOLIN HFA) 108 (90 BASE) MCG/ACT inhaler Inhale 2 puffs into the lungs every 6 (six) hours as needed.   Yes Historical Provider, MD  albuterol (PROVENTIL) (2.5 MG/3ML) 0.083% nebulizer solution Take 2.5 mg by nebulization every 6 (six) hours as needed. For shortness of breath   Yes Historical Provider, MD  budesonide-formoterol (SYMBICORT) 160-4.5 MCG/ACT inhaler Inhale 2 puffs into the lungs 2 (two) times daily.   Yes Historical Provider, MD  calcitRIOL (ROCALTROL) 0.25 MCG capsule Take 0.25 mcg by mouth daily.     Yes Historical Provider, MD  dexamethasone (DECADRON) 4 MG tablet Take 4 mg by mouth daily with breakfast.   Yes Historical Provider, MD  DULoxetine (CYMBALTA) 30 MG capsule Take 30 mg by mouth daily.   Yes Historical Provider, MD  ferrous sulfate 325 (65 FE) MG tablet  Take 325 mg by mouth daily with breakfast.   Yes Historical Provider, MD  insulin aspart protamine-insulin aspart (NOVOLOG 70/30) (70-30) 100 UNIT/ML injection Inject 35 Units into the skin 2 (two) times daily with a meal. 06/14/11 06/13/12 Yes Romero Belling, MD  insulin aspart protamine-insulin aspart (NOVOLOG MIX 70/30 FLEXPEN) (70-30) 100 UNIT/ML injection Inject 35 Units into the skin 2 (two) times daily with a meal.   Yes Historical Provider, MD  Multiple Vitamin (MULTIVITAMIN) tablet Take 1 tablet by mouth daily.    Yes Historical Provider,  MD  Rivaroxaban (XARELTO) 15 MG TABS tablet Take 15 mg by mouth daily.   Yes Historical Provider, MD  spironolactone (ALDACTONE) 25 MG tablet Take 25 mg by mouth daily.   Yes Historical Provider, MD  tiotropium (SPIRIVA) 18 MCG inhalation capsule Place 18 mcg into inhaler and inhale daily.   Yes Historical Provider, MD  torsemide (DEMADEX) 20 MG tablet Take 20 mg by mouth 2 (two) times daily.   Yes Historical Provider, MD  ALPRAZolam (XANAX) 0.25 MG tablet Take 1 tablet (0.25 mg total) by mouth 2 (two) times daily as needed for sleep. 10/05/11   Romero Belling, MD   Physical Exam: Filed Vitals:   01/19/12 1700 01/19/12 1800 01/19/12 1900 01/19/12 1940  BP: 127/52 141/60 118/65   Pulse: 139 120 121   Temp:    99.8 F (37.7 C)  TempSrc:    Rectal  Resp: 23 21 21    Height: 5' 6.14" (1.68 m)     Weight: 118.2 kg (260 lb 9.3 oz)     SpO2: 94% 94% 94%     General:  NAD, resting comfortably in bed Eyes: PEERLA EOMI ENT: mucous membranes moist Neck: supple w/o JVD Cardiovascular: RRR w/o MRG Respiratory: diffuse crackles, loudest in the LLL but audible on exam without a stethoscope. Abdomen: soft, nt, nd, bs+ Skin: no rash nor lesion Musculoskeletal: MAE, full ROM all 4 extremities Psychiatric: normal tone and affect Neurologic: AAOx3, grossly non-focal  Labs on Admission:  Basic Metabolic Panel:  Lab 01/19/12 1610 01/19/12 1530  NA 135 133*  K 4.8 4.8  CL 103 95*  CO2 -- 25  GLUCOSE 332* 343*  BUN 51* 53*  CREATININE 2.10* 1.83*  CALCIUM -- 10.1  MG -- --  PHOS -- --   Liver Function Tests: No results found for this basename: AST:5,ALT:5,ALKPHOS:5,BILITOT:5,PROT:5,ALBUMIN:5 in the last 168 hours No results found for this basename: LIPASE:5,AMYLASE:5 in the last 168 hours No results found for this basename: AMMONIA:5 in the last 168 hours CBC:  Lab 01/19/12 1615 01/19/12 1530  WBC -- 10.7*  NEUTROABS -- 9.2*  HGB 11.9* 10.8*  HCT 35.0* 33.7*  MCV -- 87.1  PLT -- 159     Cardiac Enzymes: No results found for this basename: CKTOTAL:5,CKMB:5,CKMBINDEX:5,TROPONINI:5 in the last 168 hours  BNP (last 3 results)  Basename 10/21/11 0455 10/19/11 1453 06/26/11 0526  PROBNP 13824.0* 9736.0* 7258.0*   CBG:  Lab 01/19/12 2043  GLUCAP 381*    Radiological Exams on Admission: Dg Chest 2 View  01/19/2012  *RADIOLOGY REPORT*  Clinical Data: Cough, fever.  CHEST - 2 VIEW  Comparison: November 09, 2011.  Findings: Cardiomediastinal silhouette appears normal.  Ill-defined density is seen in the lingular portion of the left lower lobe concerning for pneumonia or atelectasis.  Ill-defined density is seen in right upper lobe concerning for possible pulmonary nodule or mass.  IMPRESSION: Possible left lingular pneumonia or atelectasis.  Right upper lobe density is  noted concerning for mass or neoplasm; chest CT is recommended for further evaluation.   Original Report Authenticated By: Lupita Raider.,  M.D.     EKG: Independently reviewed.  Assessment/Plan Principal Problem:  *HCAP (healthcare-associated pneumonia) Active Problems:  Pulmonary nodule  Sepsis   1. HCAP - treating patient as HCAP with new onset cough, fever, wet breath sounds, cefepime, levaquin, and vancomycin. 2. Sepsis - continuing fluids at 75 cc/hr, no evidence of hypotension 3. Tachycardia - part of #2 syndrome, will monitor with telemetry 4. Pulmonary density in the RUL- evaluating with CT chest, no contrast due to creatinine being 2.1 (baseline looks to be about 1.8). 5. CKD with acute elevation in creatinine - creatinine 2.1, baseline 1.8, not enough to formally qualify as AKI but will hold off on nephrotoxic contrast as a result, monitor with daily BMPs    Code Status: Full Code (must indicate code status--if unknown or must be presumed, indicate so) Family Communication: No family in room (indicate person spoken with, if applicable, with phone number if by telephone) Disposition Plan:  Admit to inpatient (indicate anticipated LOS)  Time spent: 70 min  GARDNER, JARED M. [facility] Pager [phone]  If 7PM-7AM, please contact night-coverage [email or web address] Password [facility] 01/19/2012, 9:04 PM

## 2012-01-19 NOTE — ED Notes (Signed)
Patient in coffee ground emesis on admission to room.  This is the only time patient has vomited.  She has also been incontinent of urine for 3 days.

## 2012-01-19 NOTE — ED Notes (Signed)
Documented tubes and IVs from last admision as not present on this admission.

## 2012-01-19 NOTE — ED Provider Notes (Signed)
History     CSN: [id number]  Arrival date & time 01/19/12  1340   First MD Initiated Contact with Patient 01/19/12 1438      Chief Complaint  Patient presents with  . Back Pain    (Consider location/radiation/quality/duration/timing/severity/associated sxs/prior treatment) HPI Complains of diffuse low back pain onset approximately 3 days ago accompanied by dysuria and cough. Also admits to mild shortness of breath. EMS treated patient with increasing supplemental oxygen from 2 L to 6 L en route. No other associated symptoms back pain is worse with movement denies abdominal pain no other associated symptoms Past Medical History  Diagnosis Date  . RENAL INSUFFICIENCY 10/15/2008  . Edema 06/18/2008  . PNEUMONIA 12/01/2007  . CARPAL TUNNEL SYNDROME, BILATERAL 10/30/2007  . HAND PAIN, LEFT 09/04/2007  . Morbid obesity   . ANXIETY 11/08/2006  . HYPERTENSION 11/08/2006  . DIABETES MELLITUS, TYPE II 11/08/2006  . HYPERCHOLESTEROLEMIA 07/24/2009  . ANEMIA 07/24/2009  . COPD 11/08/2006    -HFA 50% p coaching 10/15/2009  . RESPIRATORY FAILURE, CHRONIC 10/15/2009  . BACK PAIN, LUMBAR 03/07/2009  . CHEST PAIN 07/24/2009  . Restless leg syndrome   . Systolic CHF, acute 03/24/2011  . Ischemic cardiomyopathy 03/26/2011    Past Surgical History  Procedure Date  . Abdominal hysterectomy 1987    Family History  Problem Relation Age of Onset  . Heart disease Mother   . Pancreatic cancer Mother   . Stroke Mother   . Asthma Paternal Grandfather   . Liver cancer Father   . Diabetes Father     History  Substance Use Topics  . Smoking status: Former Smoker -- 2.0 packs/day for 40 years    Quit date: 01/12/2008  . Smokeless tobacco: Not on file  . Alcohol Use: No    OB History    Grav Para Term Preterm Abortions TAB SAB Ect Mult Living                  Review of Systems  Respiratory: Positive for cough and shortness of breath.   Genitourinary: Positive for dysuria.  Musculoskeletal:  Positive for back pain.  All other systems reviewed and are negative.    Allergies  Pioglitazone and Rosiglitazone maleate  Home Medications   Current Outpatient Rx  Name  Route  Sig  Dispense  Refill  . ACETAMINOPHEN 500 MG PO TABS   Oral   Take 1,000 mg by mouth every 6 (six) hours as needed. For pain         . ALBUTEROL SULFATE HFA 108 (90 BASE) MCG/ACT IN AERS   Inhalation   Inhale 2 puffs into the lungs every 6 (six) hours as needed.         . ALBUTEROL SULFATE (2.5 MG/3ML) 0.083% IN NEBU   Nebulization   Take 2.5 mg by nebulization every 6 (six) hours as needed. For shortness of breath         . BUDESONIDE-FORMOTEROL FUMARATE 160-4.5 MCG/ACT IN AERO   Inhalation   Inhale 2 puffs into the lungs 2 (two) times daily.         Marland Kitchen CALCITRIOL 0.25 MCG PO CAPS   Oral   Take 0.25 mcg by mouth daily.           Marland Kitchen DEXAMETHASONE 4 MG PO TABS   Oral   Take 4 mg by mouth daily with breakfast.         . DULOXETINE HCL 30 MG PO CPEP   Oral  Take 30 mg by mouth daily.         Marland Kitchen FERROUS SULFATE 325 (65 FE) MG PO TABS   Oral   Take 325 mg by mouth daily with breakfast.         . INSULIN ASPART PROT & ASPART (70-30) 100 UNIT/ML [place] SUSP   Subcutaneous   Inject 35 Units into the skin 2 (two) times daily with a meal.         . INSULIN ASPART PROT & ASPART (70-30) 100 UNIT/ML [place] SUSP   Subcutaneous   Inject 35 Units into the skin 2 (two) times daily with a meal.         . ONE-DAILY MULTI VITAMINS PO TABS   Oral   Take 1 tablet by mouth daily.          Marland Kitchen RIVAROXABAN 15 MG PO TABS   Oral   Take 15 mg by mouth daily.         Marland Kitchen SPIRONOLACTONE 25 MG PO TABS   Oral   Take 25 mg by mouth daily.         Marland Kitchen TIOTROPIUM BROMIDE MONOHYDRATE 18 MCG IN CAPS   Inhalation   Place 18 mcg into inhaler and inhale daily.         . TORSEMIDE 20 MG PO TABS   Oral   Take 20 mg by mouth 2 (two) times daily.         Marland Kitchen ALPRAZOLAM 0.25 MG PO TABS   Oral   Take  1 tablet (0.25 mg total) by mouth 2 (two) times daily as needed for sleep.   60 tablet   2     BP 135/50  Pulse 75  Temp 99.8 F (37.7 C) (Oral)  Resp 27  SpO2 94%  Physical Exam  Nursing note and vitals reviewed. Constitutional: She is oriented to person, place, and time.       Chronically ill-appearing,  HENT:  Head: Normocephalic and atraumatic.  Eyes: Conjunctivae normal are normal. Pupils are equal, round, and reactive to light.  Neck: Neck supple. No tracheal deviation present. No thyromegaly present.  Cardiovascular: Normal rate and regular rhythm.   No murmur heard. Pulmonary/Chest: Effort normal and breath sounds normal.       Diffuse rails, coughing  Abdominal: Soft. Bowel sounds are normal. She exhibits no distension. There is no tenderness.  Musculoskeletal: Normal range of motion. She exhibits no edema and no tenderness.  Neurological: She is alert and oriented to person, place, and time. Coordination normal.  Skin: Skin is warm and dry. No rash noted.  Psychiatric: She has a normal mood and affect.    ED Course  Procedures (including critical care time)  Labs Reviewed - No data to display No results found.  Results for orders placed during the hospital encounter of 01/19/12  CBC WITH DIFFERENTIAL      Component Value Range   WBC 10.7 (*) 4.0 - 10.5 K/uL   RBC 3.87  3.87 - 5.11 MIL/uL   Hemoglobin 10.8 (*) 12.0 - 15.0 g/dL   HCT 11.9 (*) 14.7 - 82.9 %   MCV 87.1  78.0 - 100.0 fL   MCH 27.9  26.0 - 34.0 pg   MCHC 32.0  30.0 - 36.0 g/dL   RDW 56.2  13.0 - 86.5 %   Platelets 159  150 - 400 K/uL   Neutrophils Relative 86 (*) 43 - 77 %   Neutro Abs 9.2 (*) 1.7 - 7.7  K/uL   Lymphocytes Relative 8 (*) 12 - 46 %   Lymphs Abs 0.9  0.7 - 4.0 K/uL   Monocytes Relative 6  3 - 12 %   Monocytes Absolute 0.6  0.1 - 1.0 K/uL   Eosinophils Relative 0  0 - 5 %   Eosinophils Absolute 0.0  0.0 - 0.7 K/uL   Basophils Relative 0  0 - 1 %   Basophils Absolute 0.0  0.0  - 0.1 K/uL  BASIC METABOLIC PANEL      Component Value Range   Sodium 133 (*) 135 - 145 mEq/L   Potassium 4.8  3.5 - 5.1 mEq/L   Chloride 95 (*) 96 - 112 mEq/L   CO2 25  19 - 32 mEq/L   Glucose, Bld 343 (*) 70 - 99 mg/dL   BUN 53 (*) 6 - 23 mg/dL   Creatinine, Ser 0.86 (*) 0.50 - 1.10 mg/dL   Calcium 57.8  8.4 - 46.9 mg/dL   GFR calc non Af Amer 28 (*) >90 mL/min   GFR calc Af Amer 32 (*) >90 mL/min  URINALYSIS, ROUTINE W REFLEX MICROSCOPIC      Component Value Range   Color, Urine YELLOW  YELLOW   APPearance CLEAR  CLEAR   Specific Gravity, Urine 1.025  1.005 - 1.030   pH 5.0  5.0 - 8.0   Glucose, UA 250 (*) NEGATIVE mg/dL   Hgb urine dipstick MODERATE (*) NEGATIVE   Bilirubin Urine NEGATIVE  NEGATIVE   Ketones, ur NEGATIVE  NEGATIVE mg/dL   Protein, ur 629 (*) NEGATIVE mg/dL   Urobilinogen, UA 0.2  0.0 - 1.0 mg/dL   Nitrite NEGATIVE  NEGATIVE   Leukocytes, UA NEGATIVE  NEGATIVE  POCT I-STAT, CHEM 8      Component Value Range   Sodium 135  135 - 145 mEq/L   Potassium 4.8  3.5 - 5.1 mEq/L   Chloride 103  96 - 112 mEq/L   BUN 51 (*) 6 - 23 mg/dL   Creatinine, Ser 5.28 (*) 0.50 - 1.10 mg/dL   Glucose, Bld 413 (*) 70 - 99 mg/dL   Calcium, Ion 2.44  0.10 - 1.30 mmol/L   TCO2 27  0 - 100 mmol/L   Hemoglobin 11.9 (*) 12.0 - 15.0 g/dL   HCT 27.2 (*) 53.6 - 64.4 %  CG4 I-STAT (LACTIC ACID)      Component Value Range   Lactic Acid, Venous 1.81  0.5 - 2.2 mmol/L  LACTIC ACID, PLASMA      Component Value Range   Lactic Acid, Venous 0.9  0.5 - 2.2 mmol/L  URINE MICROSCOPIC-ADD ON      Component Value Range   WBC, UA 0-2  <3 WBC/hpf   RBC / HPF 0-2  <3 RBC/hpf   Bacteria, UA RARE  RARE   Casts GRANULAR CAST (*) NEGATIVE   Urine-Other AMORPHOUS URATES/PHOSPHATES     Ct Abdomen Pelvis Wo Contrast  12/22/2011  *RADIOLOGY REPORT*  Clinical Data: Left leg swelling for 1 week and lower back pain radiating to the abdomen  CT ABDOMEN AND PELVIS WITHOUT CONTRAST  Technique:   Multidetector CT imaging of the abdomen and pelvis was performed following the standard protocol without intravenous contrast.  Comparison: CT abdomen pelvis of 08/23/2006  Findings: There is minimal patchy opacity within the right lower lobe, and a developing pneumonia cannot be excluded.  No effusion is seen.  The liver is unremarkable in the unenhanced state.  No calcified gallstones  are seen.  The pancreas is normal in size and the pancreatic duct is not dilated.  The adrenal glands and spleen are unremarkable  and stable.  The stomach is decompressed.  No renal calculi are seen and there is no evidence of hydronephrosis. The abdominal aorta is normal in caliber with moderate atheromatous change present.  No adenopathy is seen.  The urinary bladder is unremarkable.  The urinary bladder is unremarkable.  The uterus has been resected.  No adnexal lesion is seen.  No fluid is noted within the pelvis and no mass is seen.  No abnormality of the colon is seen.  There is no distention of the common femoral veins.  There are diffuse degenerative changes throughout the lumbar spine.  No compression deformity is seen.  IMPRESSION:  1.  No pelvic mass is seen and there is no distention of the common femoral veins or IVC. 2.  Patchy opacity in the right lower lobe may represent developing pneumonia.  Correlate clinically.   Original Report Authenticated By: Dwyane Dee, M.D.    Dg Chest 2 View  01/19/2012  *RADIOLOGY REPORT*  Clinical Data: Cough, fever.  CHEST - 2 VIEW  Comparison: November 09, 2011.  Findings: Cardiomediastinal silhouette appears normal.  Ill-defined density is seen in the lingular portion of the left lower lobe concerning for pneumonia or atelectasis.  Ill-defined density is seen in right upper lobe concerning for possible pulmonary nodule or mass.  IMPRESSION: Possible left lingular pneumonia or atelectasis.  Right upper lobe density is noted concerning for mass or neoplasm; chest CT is recommended for  further evaluation.   Original Report Authenticated By: Lupita Raider.,  M.D.     No diagnosis found.  Spoke with Dr. who will arrange for admission Chest x-ray reviewed by me MDM  Dan admit IV antibiotics Diagnosis #1healthcare associated pneumonia #2 hyperglycemia 3 renal insufficiency        Doug Sou, MD 01/19/12 1955

## 2012-01-20 ENCOUNTER — Encounter (HOSPITAL_COMMUNITY): Payer: Self-pay | Admitting: Internal Medicine

## 2012-01-20 DIAGNOSIS — I4891 Unspecified atrial fibrillation: Secondary | ICD-10-CM | POA: Insufficient documentation

## 2012-01-20 LAB — BASIC METABOLIC PANEL WITH GFR
BUN: 50 mg/dL — ABNORMAL HIGH (ref 6–23)
CO2: 23 meq/L (ref 19–32)
Calcium: 9.6 mg/dL (ref 8.4–10.5)
Chloride: 100 meq/L (ref 96–112)
Creatinine, Ser: 1.66 mg/dL — ABNORMAL HIGH (ref 0.50–1.10)
GFR calc Af Amer: 36 mL/min — ABNORMAL LOW
GFR calc non Af Amer: 31 mL/min — ABNORMAL LOW
Glucose, Bld: 143 mg/dL — ABNORMAL HIGH (ref 70–99)
Potassium: 4.4 meq/L (ref 3.5–5.1)
Sodium: 136 meq/L (ref 135–145)

## 2012-01-20 LAB — INFLUENZA PANEL BY PCR (TYPE A & B)
H1N1 flu by pcr: NOT DETECTED
Influenza A By PCR: NEGATIVE
Influenza B By PCR: NEGATIVE

## 2012-01-20 LAB — GLUCOSE, CAPILLARY
Glucose-Capillary: 196 mg/dL — ABNORMAL HIGH (ref 70–99)
Glucose-Capillary: 167 mg/dL — ABNORMAL HIGH (ref 70–99)
Glucose-Capillary: 505 mg/dL — ABNORMAL HIGH (ref 70–99)
Glucose-Capillary: 492 mg/dL — ABNORMAL HIGH (ref 70–99)
Glucose-Capillary: 287 mg/dL — ABNORMAL HIGH (ref 70–99)

## 2012-01-20 LAB — URINE CULTURE
Colony Count: NO GROWTH
Culture: NO GROWTH

## 2012-01-20 LAB — CBC
HCT: 30.6 % — ABNORMAL LOW (ref 36.0–46.0)
Hemoglobin: 9.9 g/dL — ABNORMAL LOW (ref 12.0–15.0)
MCH: 28.2 pg (ref 26.0–34.0)
MCHC: 32.4 g/dL (ref 30.0–36.0)
MCV: 87.2 fL (ref 78.0–100.0)
Platelets: 158 10*3/uL (ref 150–400)
RBC: 3.51 MIL/uL — ABNORMAL LOW (ref 3.87–5.11)
RDW: 15.5 % (ref 11.5–15.5)
WBC: 8.5 10*3/uL (ref 4.0–10.5)

## 2012-01-20 MED ORDER — INSULIN GLARGINE 100 UNIT/ML ~~LOC~~ SOLN: 35.0000 [IU] | Freq: Every day | SUBCUTANEOUS | Status: DC

## 2012-01-20 MED ORDER — INSULIN ASPART 100 UNIT/ML ~~LOC~~ SOLN
0.0000 [IU] | Freq: Three times a day (TID) | SUBCUTANEOUS | Status: DC
  Administered 2012-01-21: 20 [IU] via SUBCUTANEOUS
  Administered 2012-01-21: 7 [IU] via SUBCUTANEOUS
  Administered 2012-01-21: 15 [IU] via SUBCUTANEOUS
  Administered 2012-01-22 (×2): 7 [IU] via SUBCUTANEOUS

## 2012-01-20 MED ORDER — INSULIN ASPART 100 UNIT/ML ~~LOC~~ SOLN
15.0000 [IU] | Freq: Once | SUBCUTANEOUS | Status: AC
  Administered 2012-01-20: 15 [IU] via SUBCUTANEOUS

## 2012-01-20 MED ORDER — ALBUTEROL SULFATE (5 MG/ML) 0.5% IN NEBU
2.5000 mg | INHALATION_SOLUTION | Freq: Four times a day (QID) | RESPIRATORY_TRACT | Status: DC
  Administered 2012-01-20 – 2012-01-22 (×7): 2.5 mg via RESPIRATORY_TRACT
  Filled 2012-01-20 (×9): qty 0.5

## 2012-01-20 MED ORDER — INSULIN ASPART 100 UNIT/ML ~~LOC~~ SOLN
6.0000 [IU] | Freq: Three times a day (TID) | SUBCUTANEOUS | Status: DC
  Administered 2012-01-20 – 2012-01-21 (×3): 6 [IU] via SUBCUTANEOUS

## 2012-01-20 MED ORDER — INSULIN GLARGINE 100 UNIT/ML ~~LOC~~ SOLN
35.0000 [IU] | Freq: Every day | SUBCUTANEOUS | Status: DC
  Administered 2012-01-20: 35 [IU] via SUBCUTANEOUS

## 2012-01-20 MED ORDER — INSULIN ASPART 100 UNIT/ML ~~LOC~~ SOLN: 6.0000 [IU] | Freq: Three times a day (TID) | SUBCUTANEOUS | Status: DC

## 2012-01-20 MED ORDER — INSULIN ASPART 100 UNIT/ML ~~LOC~~ SOLN
25.0000 [IU] | Freq: Once | SUBCUTANEOUS | Status: AC
  Administered 2012-01-20: 25 [IU] via SUBCUTANEOUS

## 2012-01-20 MED ORDER — INSULIN GLARGINE 100 UNIT/ML ~~LOC~~ SOLN
30.0000 [IU] | Freq: Every day | SUBCUTANEOUS | Status: DC
  Administered 2012-01-20: 30 [IU] via SUBCUTANEOUS

## 2012-01-20 MED ORDER — INSULIN ASPART 100 UNIT/ML ~~LOC~~ SOLN
5.0000 [IU] | Freq: Once | SUBCUTANEOUS | Status: AC
  Administered 2012-01-20: 5 [IU] via SUBCUTANEOUS

## 2012-01-20 MED ORDER — INSULIN GLARGINE 100 UNIT/ML ~~LOC~~ SOLN: 30.0000 [IU] | Freq: Every day | SUBCUTANEOUS | Status: DC

## 2012-01-20 MED ORDER — INSULIN ASPART 100 UNIT/ML ~~LOC~~ SOLN
0.0000 [IU] | Freq: Three times a day (TID) | SUBCUTANEOUS | Status: DC
  Administered 2012-01-20: 15 [IU] via SUBCUTANEOUS
  Administered 2012-01-20: 3 [IU] via SUBCUTANEOUS

## 2012-01-20 NOTE — Progress Notes (Addendum)
[facility] PROGRESS NOTE  [name] ZOX:[id number] DOB: 25-Sep-1945 DOA: 01/19/2012 PCP: No primary provider on file.    1. HCAP - treating patient as HCAP with new onset cough, fever, wet breath sounds, cefepime, levaquin, and vancomycin day #2. Blood cultures pending. Temp 99.6. Hemodynamically stable. Oxygen support monitor sats 2. Sepsis - continuing fluids at 75 cc/hr, no evidence of hypotension. Improving somewhat. WC WNL, temp 99.6. Continue IV fluids as well as antibiotics. Hemodynamically stable. Monitor closely  3. Tachycardia - part of #2 syndrome trending downward. Continue fluids and tele. No c/o chest pain.  4. COPD. On oxygen at home. Scheduled nebs as at home, continue home spiriva and decedron 5. CKD stage III with acute elevation in creatinine - Improving toward baseline. Continue to hold nephrotoxins. Gentle IV fluids. Recheck in am 6. Afib: hx of according to chart. Currently in sinus. Continue tele and home meds 7. Chronic systolic heart failure: compensated currently. Will monitor intake and output. Daily weight. Continue home spironolactone and demadex 8. DM fair control will check a1c. Will use SSI and lantus for glycemic control 9. Anemia: likely related to chronic disease. No s/sx blding. VSS will monitor and continue supplement 10. HTN fair control: continue home meds. Monitor 11. Anxiety: at baseline. Continue home meds 12. RUL nodule. No change in a spiculated nodular density in the right upper  lobe consistent with benign process given its presence since 2005.  0.6 cm nodule in the RUL new and could represent infection/inflamaiton. If high risk for bronchiocarcinoma repeat CT 6 months  Code Status: full Family Communication: pt at bedsid Disposition Plan: home when stable. May benefit snf   Consultants:  none  Procedures:  none  Antibiotics:  vanc 01/19/12  levaquin 01/19/12  Cefepime 01/19/12  HPI/Subjective: Sitting on side of bed. Appears  somewhat ill but NAD  Objective: Filed Vitals:   01/19/12 2000 01/19/12 2100 01/19/12 2207 01/20/12 0557  BP: 133/49 136/63 153/80 155/92  Pulse: 117 121 115 92  Temp:   99 F (37.2 C) 99.4 F (37.4 C)  TempSrc:   Oral Oral  Resp: 19 18 22 22   Height:   5\' 7"  (1.702 m)   Weight:   116.2 kg (256 lb 2.8 oz)   SpO2: 95% 95% 94% 93%    Intake/Output Summary (Last 24 hours) at 01/20/12 0856 Last data filed at 01/20/12 0500  Gross per 24 hour  Intake    480 ml  Output      2 ml  Net    478 ml   Filed Weights   01/19/12 1700 01/19/12 2207  Weight: 118.2 kg (260 lb 9.3 oz) 116.2 kg (256 lb 2.8 oz)    Exam:   General:  Obese, alert NAD  Cardiovascular: RRR No MGR trace LEE feet with pallor  Respiratory: mild increased work of breathing. BS diminished throughout R>L. No wheeze. Underlying expiratory rhonchi. Fine crackles base on left.  Abdomen: obese soft +BS non-tender to palpation  Data Reviewed: Basic Metabolic Panel:  Lab 01/20/12 8119 01/19/12 1615 01/19/12 1530  NA 136 135 133*  K 4.4 4.8 4.8  CL 100 103 95*  CO2 23 -- 25  GLUCOSE 143* 332* 343*  BUN 50* 51* 53*  CREATININE 1.66* 2.10* 1.83*  CALCIUM 9.6 -- 10.1  MG -- -- --  PHOS -- -- --   Liver Function Tests: No results found for this basename: AST:5,ALT:5,ALKPHOS:5,BILITOT:5,PROT:5,ALBUMIN:5 in the last 168 hours No results found for this basename: LIPASE:5,AMYLASE:5  in the last 168 hours No results found for this basename: AMMONIA:5 in the last 168 hours CBC:  Lab 01/20/12 0648 01/19/12 1615 01/19/12 1530  WBC 8.5 -- 10.7*  NEUTROABS -- -- 9.2*  HGB 9.9* 11.9* 10.8*  HCT 30.6* 35.0* 33.7*  MCV 87.2 -- 87.1  PLT 158 -- 159   Cardiac Enzymes: No results found for this basename: CKTOTAL:5,CKMB:5,CKMBINDEX:5,TROPONINI:5 in the last 168 hours BNP (last 3 results)  Basename 10/21/11 0455 10/19/11 1453 06/26/11 0526  PROBNP 13824.0* 9736.0* 7258.0*   CBG:  Lab 01/20/12 0803 01/20/12 0610  01/20/12 0507 01/20/12 0403 01/20/12 0259  GLUCAP 145* 154* 138* 145* 167*    No results found for this or any previous visit (from the past 240 hour(s)).   Studies: Dg Chest 2 View  01/19/2012  *RADIOLOGY REPORT*  Clinical Data: Cough, fever.  CHEST - 2 VIEW  Comparison: November 09, 2011.  Findings: Cardiomediastinal silhouette appears normal.  Ill-defined density is seen in the lingular portion of the left lower lobe concerning for pneumonia or atelectasis.  Ill-defined density is seen in right upper lobe concerning for possible pulmonary nodule or mass.  IMPRESSION: Possible left lingular pneumonia or atelectasis.  Right upper lobe density is noted concerning for mass or neoplasm; chest CT is recommended for further evaluation.   Original Report Authenticated By: Lupita Raider.,  M.D.    Ct Chest Wo Contrast  01/19/2012  *RADIOLOGY REPORT*  Clinical Data: Possible pneumonia.  Abnormal chest x-ray.  CT CHEST WITHOUT CONTRAST  Technique:  Multidetector CT imaging of the chest was performed following the standard protocol without IV contrast.  Comparison: CT chest 09/29/2010 and, 09/29/2010, 07/05/2008 and 02/07/2003.  Plain films of the chest earlier this same day and 11/09/2011.  Findings: Scattered, small mediastinal lymph nodes are unchanged. No new or enlarging lymphadenopathy is identified.  There is no pleural or pericardial effusion.  Spiculated nodular density in the right upper lobe measuring 1.7 cm in diameter on image 20 is unchanged.  There is a new small nodule in the right upper lobe measuring 0.6 cm in diameter on image 19. There is airspace disease scattered throughout the left upper lobe, most confluent anteriorly.  Confluent airspace opacity is also identified in the right middle lobe.  There are air bronchograms associated with confluent airspace disease.  The lungs are otherwise unremarkable.  Incidentally imaged upper abdomen demonstrates fatty infiltration of the liver.  Visualized  intra-abdominal contents are otherwise unremarkable.  There is no focal bony abnormality.  IMPRESSION:  1.  Multifocal pneumonia worst in the right middle and left upper lobes. 2.  No change in a spiculated nodular density in the right upper lobe consistent with benign process given its presence since 2005. 3.  0.6 cm nodule in the right upper lobe is new since the prior studies and likely represents an infectious or inflammatory process although it cannot be definitively characterized. If the patient is at high risk for bronchogenic carcinoma, follow-up chest CT at 6-12 months is recommended.  If the patient is at low risk for bronchogenic carcinoma, follow-up chest CT at 12 months is recommended.  This recommendation follows the consensus statement: Guidelines for Management of Small Pulmonary Nodules Detected on CT Scans: A Statement from the Fleischner Society as published in Radiology 2005; 237:395-400. 4.  Fatty infiltration of liver.   Original Report Authenticated By: Holley Dexter, M.D.     Scheduled Meds:    . albuterol  2.5 mg Nebulization Q6H  .  budesonide-formoterol  2 puff Inhalation BID  . calcitRIOL  0.25 mcg Oral Daily  . ceFEPime (MAXIPIME) IV  1 g Intravenous Q12H  . dexamethasone  4 mg Oral Q breakfast  . DULoxetine  30 mg Oral Daily  . ferrous sulfate  325 mg Oral Q breakfast  . insulin aspart  0-15 Units Subcutaneous TID WC  . insulin glargine  30 Units Subcutaneous Daily  . levofloxacin (LEVAQUIN) IV  750 mg Intravenous Q48H  . multivitamin with minerals  1 tablet Oral Daily  . Rivaroxaban  15 mg Oral Q supper  . spironolactone  25 mg Oral Daily  . tiotropium  18 mcg Inhalation Daily  . torsemide  20 mg Oral BID  . vancomycin  1,500 mg Intravenous Q24H   Continuous Infusions:    . dextrose 5 % and 0.45% NaCl 75 mL/hr at 01/20/12 0157    Principal Problem:  *HCAP (healthcare-associated pneumonia) Active Problems:  HYPERTENSION  COPD  RESPIRATORY FAILURE,  CHRONIC  Systolic CHF, acute on chronic  Pulmonary nodule  Sepsis    Time spent: 45 minutes    [facility] M  [facility]  If 8PM-8AM, please contact night-coverage at [email or web address], password [facility] 01/20/2012, 8:56 AM  LOS: 1 day

## 2012-01-20 NOTE — Clinical Social Work Psychosocial (Signed)
     Clinical Social Work Department BRIEF PSYCHOSOCIAL ASSESSMENT 01/20/2012  Patient:  [name], [name]     Account Number:  [id number]     Admit date:  01/19/2012  Clinical Social Worker:  Peggyann Shoals  Date/Time:  01/20/2012 02:59 PM  Referred by:  Physician  Date Referred:  01/20/2012 Referred for  SNF Placement   Other Referral:   Interview type:  Patient Other interview type:    PSYCHOSOCIAL DATA Living Status:  FAMILY Admitted from facility:   Level of care:   Primary support name:  [place] Primary support relationship to patient:  CHILD, ADULT Degree of support available:   adequate    CURRENT CONCERNS Current Concerns  Post-Acute Placement   Other Concerns:    SOCIAL WORK ASSESSMENT / PLAN CSW met with pt to address consult. CSW introduced herself and explained role of social work.    Pt shared that she lives with her daughter in [place]. Pt stated that she is interested in SNF placement at [facility]. Pt shared that she has been there in past and would like to return.    CSW will initiate SNF search and follow up with bed offers.   Assessment/plan status:  Psychosocial Support/Ongoing Assessment of Needs Other assessment/ plan:   Information/referral to community resources:   SNF List    PATIENTS/FAMILYS RESPONSE TO PLAN OF CARE: Pt was alert and oriented. Pt would like SNF at discharge.

## 2012-01-20 NOTE — Clinical Social Work Placement (Addendum)
    Clinical Social Work Department CLINICAL SOCIAL WORK PLACEMENT NOTE 01/20/2012  Patient:  [name], [name]  Account Number:  [id number] Admit date:  01/19/2012  Clinical Social Worker:  Peggyann Shoals  Date/time:  01/20/2012 03:26 PM  Clinical Social Work is seeking post-discharge placement for this patient at the following level of care:   SKILLED NURSING   (*CSW will update this form in Epic as items are completed)   01/20/2012  Patient/family provided with [facility] Department of Clinical Social Work's list of facilities offering this level of care within the geographic area requested by the patient (or if unable, by the patient's family).  01/20/2012  Patient/family informed of their freedom to choose among providers that offer the needed level of care, that participate in Medicare, Medicaid or managed care program needed by the patient, have an available bed and are willing to accept the patient.  01/20/2012  Patient/family informed of [facility]' ownership interest in [facility], as well as of the fact that they are under no obligation to receive care at this facility.  PASARR submitted to EDS on 01/20/2012 PASARR number received from EDS on 01/20/2012  FL2 transmitted to all facilities in geographic area requested by pt/family on  01/20/2012 FL2 transmitted to all facilities within larger geographic area on   Patient informed that his/her managed care company has contracts with or will negotiate with  certain facilities, including the following:     Patient/family informed of bed offers received:  01/21/2011 Patient chooses bed at  [facility] Physician recommends and patient chooses bed at    Patient to be transferred to  on  [facility] on 01/22/2012 Patient to be transferred to facility by [facility]  The following physician request were entered in Epic:   Additional Comments:

## 2012-01-20 NOTE — Care Management Note (Signed)
    Page 1 of 1   01/26/2012     5:25:15 PM   CARE MANAGEMENT NOTE 01/26/2012  Patient:  [name], [name]   Account Number:  [id number]  Date Initiated:  01/20/2012  Documentation initiated by:  Letha Cape  Subjective/Objective Assessment:   dx pna  admit- lives with daughter. pt was recently at [facility].     Action/Plan:   pt eval needed   Anticipated DC Date:  01/20/2012   Anticipated DC Plan:  SKILLED NURSING FACILITY  In-house referral  Clinical Social Worker      DC Planning Services  CM consult      Choice offered to / List presented to:             Status of service:  Completed, signed off Medicare Important Message given?   (If response is "NO", the following Medicare IM given date fields will be blank) Date Medicare IM given:   Date Additional Medicare IM given:    Discharge Disposition:  SKILLED NURSING FACILITY  Per UR Regulation:  Reviewed for med. necessity/level of care/duration of stay  If discussed at Long Length of Stay Meetings, dates discussed:    Comments:  01/20/12 1:32 Letha Cape RN, BSN [phone] patient states she lives with daughter, and patient states she wants to go to [place] to get some rehab she thinks this will be better for her.  Awaiting pt eval, CSW aware.

## 2012-01-20 NOTE — Progress Notes (Signed)
Patient is currently active with [facility] Care Management for chronic disease management services.  Patient has been engaged by a [employer].  Spoke with patient at bedside.  She desires to go to a SNF at discharge because of some difficulty she has had living with her daughter.  Patient is agreeable to going to an ALF after her SNF stay if she is medically appropriate.  Made inpatient Case Manager aware that [facility] Care Management following.  Of note, [facility] Care Management services does not replace or interfere with any services that are arranged by inpatient case management or social work.  For additional questions or referrals please contact Anibal Henderson BSN RN [facility] Liaison at [phone].

## 2012-01-20 NOTE — Progress Notes (Signed)
[facility] called back stated would put in order for Novolog 5 units x1. Will continue to monitor patient. Nelda Marseille, RN

## 2012-01-20 NOTE — Progress Notes (Signed)
Addendum  Patient seen and examined, chart and data base reviewed.  I agree with the above assessment and plan.  For full details please see Mrs. Toya Smothers NP note.  HCAP started on respiratory antibiotics, she is on continuous oxygen/chronic respiratory failure.  Her spironolactone and Demadex continued, and I will DC the IV fluids.   Clint Lipps, MD [facility] Pager: [phone] 01/20/2012, 1:36 PM

## 2012-01-20 NOTE — Progress Notes (Signed)
Paged and text paged Dr. Arthor Captain at [phone] with patient's CBG results at 2100 of 287. [facility] RN stated that Dr. Arthor Captain wanted to be paged with results. Text paged oncall provider [facility], L with CBG result of 287. No call returned. Will continue to monitor patient. Nelda Marseille, RN

## 2012-01-20 NOTE — Progress Notes (Signed)
Patient is currently active with long-term disease management services with [facility] Care Management Program. She was recently at [facility] and discharged home. She is also active with [facility]. [facility] Care Management services will continue post discharge.    Raiford Noble, MSN-Ed, RN,BSN [facility] Liaison [phone]

## 2012-01-20 NOTE — Progress Notes (Signed)
Inpatient Diabetes Program Recommendations  AACE/ADA: New Consensus Statement on Inpatient Glycemic Control (2013)  Target Ranges:  Prepandial:   less than 140 mg/dL      Peak postprandial:   less than 180 mg/dL (1-2 hours)      Critically ill patients:  140 - 180 mg/dL   Reason for Visit: Note patient transitioned off insulin drip today.  According to medication reconciliation, patient was taking 70/30 35 units bid prior to admit.  Consider adding Novolog meal coverage 6 units tid with meals.   Also please check A1C to determine pre hospitalization glycemic control.  Will follow.

## 2012-01-21 DIAGNOSIS — R05 Cough: Secondary | ICD-10-CM

## 2012-01-21 DIAGNOSIS — E1065 Type 1 diabetes mellitus with hyperglycemia: Secondary | ICD-10-CM

## 2012-01-21 DIAGNOSIS — J9610 Chronic respiratory failure, unspecified whether with hypoxia or hypercapnia: Secondary | ICD-10-CM

## 2012-01-21 DIAGNOSIS — I509 Heart failure, unspecified: Secondary | ICD-10-CM

## 2012-01-21 DIAGNOSIS — I5022 Chronic systolic (congestive) heart failure: Secondary | ICD-10-CM

## 2012-01-21 LAB — CBC
HCT: 30 % — ABNORMAL LOW (ref 36.0–46.0)
Hemoglobin: 9.9 g/dL — ABNORMAL LOW (ref 12.0–15.0)
MCV: 86.2 fL (ref 78.0–100.0)
RBC: 3.48 MIL/uL — ABNORMAL LOW (ref 3.87–5.11)
RDW: 15 % (ref 11.5–15.5)
WBC: 7.1 10*3/uL (ref 4.0–10.5)

## 2012-01-21 LAB — BASIC METABOLIC PANEL
CO2: 26 mEq/L (ref 19–32)
Chloride: 96 mEq/L (ref 96–112)
Creatinine, Ser: 1.94 mg/dL — ABNORMAL HIGH (ref 0.50–1.10)
Potassium: 4.3 mEq/L (ref 3.5–5.1)

## 2012-01-21 LAB — GLUCOSE, CAPILLARY: Glucose-Capillary: 512 mg/dL — ABNORMAL HIGH (ref 70–99)

## 2012-01-21 LAB — EXPECTORATED SPUTUM ASSESSMENT W GRAM STAIN, RFLX TO RESP C

## 2012-01-21 LAB — GLUCOSE, RANDOM: Glucose, Bld: 512 mg/dL — ABNORMAL HIGH (ref 70–99)

## 2012-01-21 MED ORDER — METOPROLOL TARTRATE 25 MG PO TABS
25.0000 mg | ORAL_TABLET | Freq: Two times a day (BID) | ORAL | Status: DC
  Filled 2012-01-21 (×2): qty 1

## 2012-01-21 MED ORDER — INSULIN GLARGINE 100 UNIT/ML ~~LOC~~ SOLN
50.0000 [IU] | Freq: Every day | SUBCUTANEOUS | Status: DC
  Administered 2012-01-21: 50 [IU] via SUBCUTANEOUS

## 2012-01-21 MED ORDER — INSULIN ASPART 100 UNIT/ML ~~LOC~~ SOLN
12.0000 [IU] | Freq: Three times a day (TID) | SUBCUTANEOUS | Status: DC
  Administered 2012-01-21 – 2012-01-22 (×3): 12 [IU] via SUBCUTANEOUS

## 2012-01-21 MED ORDER — DEXTROSE 5 % IV SOLN
1.0000 g | INTRAVENOUS | Status: DC
  Filled 2012-01-21 (×2): qty 1

## 2012-01-21 MED ORDER — METOPROLOL TARTRATE 25 MG PO TABS
25.0000 mg | ORAL_TABLET | Freq: Two times a day (BID) | ORAL | Status: DC
  Administered 2012-01-21 – 2012-01-22 (×3): 25 mg via ORAL
  Filled 2012-01-21 (×5): qty 1

## 2012-01-21 NOTE — Progress Notes (Signed)
Addendum  Patient seen and examined, chart and data base reviewed.  I agree with the above assessment and plan.  For full details please see Mrs. Toya Smothers NP note.  HCAP started on IV antibiotics, she is on continuous oxygen/chronic respiratory failure.  Severe systolic CHF, with LVEF of 23% on 10/27/2011, continue diuresis. I think her CHF is contributing to her SOB, I will add metoprolol. Consider ACEIor ARB on DC, has CKD. Titrate Lantus insulin for tighter glycemic control.  Clint Lipps Pager: [phone] 01/21/2012, 12:33 PM

## 2012-01-21 NOTE — Evaluation (Signed)
Physical Therapy Evaluation Patient Details Name: [name] MRN: [id number] DOB: 1945/04/13 Today's Date: 01/21/2012 Time: 2130-8657 PT Time Calculation (min): 15 min  PT Assessment / Plan / Recommendation Clinical Impression  Pt adm with PNA.  Pt with recent extended hospitalization/Ltach/SNF stay.  Recommend ST-SNF prior to return home.    PT Assessment  Patient needs continued PT services    Follow Up Recommendations  SNF    Does the patient have the potential to tolerate intense rehabilitation      Barriers to Discharge        Equipment Recommendations  None recommended by PT    Recommendations for Other Services     Frequency Min 3X/week    Precautions / Restrictions Precautions Precautions: Fall Restrictions Weight Bearing Restrictions: No   Pertinent Vitals/Pain Amb on 4L O2      Mobility  Transfers Transfers: Sit to Stand;Stand to Sit;Stand Pivot Transfers Sit to Stand: 4: Min assist;With upper extremity assist;From bed Stand to Sit: 4: Min assist;With upper extremity assist;To bed Stand Pivot Transfers: 4: Min assist Details for Transfer Assistance: Bed to [facility] Ambulation/Gait Ambulation/Gait Assistance: 4: Min assist Ambulation Distance (Feet): 25 Feet Assistive device: Rolling walker Ambulation/Gait Assistance Details: verbal cues to stand more erect Gait Pattern: Step-through pattern;Decreased stride length;Trunk flexed    Shoulder Instructions     Exercises     PT Diagnosis: Difficulty walking;Generalized weakness  PT Problem List: Decreased strength;Decreased activity tolerance;Decreased balance;Decreased mobility;Obesity PT Treatment Interventions: DME instruction;Gait training;Functional mobility training;Patient/family education;Therapeutic activities;Therapeutic exercise;Balance training   PT Goals Acute Rehab PT Goals PT Goal Formulation: With patient Time For Goal Achievement: 02/04/12 Potential to Achieve Goals: Good Pt will go  Supine/Side to Sit: with modified independence PT Goal: Supine/Side to Sit - Progress: Goal set today Pt will go Sit to Supine/Side: with modified independence PT Goal: Sit to Supine/Side - Progress: Goal set today Pt will go Sit to Stand: with modified independence PT Goal: Sit to Stand - Progress: Goal set today Pt will go Stand to Sit: with modified independence PT Goal: Stand to Sit - Progress: Goal set today Pt will Ambulate: 51 - 150 feet;with modified independence;with least restrictive assistive device PT Goal: Ambulate - Progress: Goal set today  Visit Information  Last PT Received On: 01/21/12 Assistance Needed: +1    Subjective Data  Subjective: Pt states she got home from SNF on Christmas Eve. Patient Stated Goal: Return home   Prior Functioning  Home Living Lives With: Daughter Type of Home: House Home Access: Stairs to enter Entrance Stairs-Number of Steps: 2-3 Entrance Stairs-Rails: Right;Left;Can reach both Home Layout: One level Bathroom Shower/Tub: Forensic scientist: Standard Home Adaptive Equipment: Walker - rolling;Tub transfer bench Prior Function Level of Independence: Independent with assistive device(s) (for transfers and gait with rolling walker) Driving: No (not since fall hospitalization) Communication Communication: No difficulties    Cognition  Overall Cognitive Status: Appears within functional limits for tasks assessed/performed Arousal/Alertness: Awake/alert Orientation Level: Appears intact for tasks assessed Behavior During Session: [facility] for tasks performed    Extremity/Trunk Assessment Right Lower Extremity Assessment RLE ROM/Strength/Tone: Deficits RLE ROM/Strength/Tone Deficits: grossly 4-/5 Left Lower Extremity Assessment LLE ROM/Strength/Tone: Deficits LLE ROM/Strength/Tone Deficits: grossly 4-/5   Balance Static Standing Balance Static Standing - Balance Support: Right upper extremity supported;During  functional activity Static Standing - Level of Assistance: 4: Min assist  End of Session PT - End of Session Equipment Utilized During Treatment: Oxygen Activity Tolerance: Patient limited by fatigue Patient  left: in bed;with call bell/phone within reach (sitting EOB) Nurse Communication: Mobility status  GP     [facility] 01/21/2012, 9:32 AM  [facility] PT [phone]

## 2012-01-21 NOTE — Progress Notes (Signed)
Inpatient Diabetes Program Recommendations  AACE/ADA: New Consensus Statement on Inpatient Glycemic Control (2013)  Target Ranges:  Prepandial:   less than 140 mg/dL      Peak postprandial:   less than 180 mg/dL (1-2 hours)      Critically ill patients:  140 - 180 mg/dL   Reason for Visit: Results for [name], [name] (MRN [id number]) as of 01/21/2012 15:32  Ref. Range 01/20/2012 18:08 01/20/2012 21:07 01/21/2012 05:58 01/21/2012 07:51 01/21/2012 12:10  Glucose-Capillary Latest Range: 70-99 mg/dL 914 (H) 782 (H)  956 (H) 230 (H)  Results for [name], [name] (MRN [id number]) as of 01/21/2012 15:32  Ref. Range 01/21/2012 05:58  Hemoglobin A1C Latest Range: <5.7 % 10.3 (H)   Note elevated A1C. Likely will need adjustment in home medications prior to admission.  Note increased Lantus and Novolog meal coverage today.  Will follow.

## 2012-01-21 NOTE — Clinical Documentation Improvement (Signed)
CKD DOCUMENTATION CLARIFICATION QUERY   THIS DOCUMENT IS NOT A PERMANENT PART OF THE MEDICAL RECORD  TO RESPOND TO THE THIS QUERY, FOLLOW THE INSTRUCTIONS BELOW:  1. If needed, update documentation for the patient's encounter via the notes activity.  2. Access this query again and click edit on the In [employer].  3. After updating, or not, click F2 to complete all highlighted (required) fields concerning your review. Select "additional documentation in the medical record" OR "no additional documentation provided".  4. Click Sign note button.  5. The deficiency will fall out of your In Basket *Please let us know if you are not able to complete this workflow by phone or e-mail (listed below).  Please update your documentation within the medical record to reflect your response to this query.                                                                                        01/21/12   Dear Toya Smothers NP/ [facility],  In a better effort to capture your patient's severity of illness, reflect appropriate length of stay and utilization of resources, a review of the patient medical record has revealed the following indicators.    Based on your clinical judgment, please clarify and document in a progress note and/or discharge summary the clinical condition associated with the following supporting information:  In responding to this query please exercise your independent judgment.  The fact that a query is asked, does not imply that any particular answer is desired or expected.   Noting documenting "CKD" if possible please state the "stage" of the diagnosis.  Thank you    Possible Clinical Conditions?   CKD Stage I -  GFR > OR = 90  CKD Stage II - GFR 60-80  CKD Stage III - GFR 30-59  CKD Stage IV - GFR 15-29  CKD Stage V - GFR < 15  Other condition  Cannot Clinically determine       Creatinine level (baseline and current)1.94 (H) 1.66 (H) 2.10 (H) 1.83    Calculated GFR: 26 (L) 90 mL/min" 31 (L) 90 mL/min" 28 (L)   Treatment: D 5  1/2 NS @ 25 ml/hr  Evaluation: BMET daily   You may use possible, probable, or suspect with inpatient documentation. possible, probable, suspected diagnoses MUST be documented at the time of discharge  Reviewed: i staged CKD in all notes i saw under my name.   Thank You,  Leonette Most Addison  Clinical Documentation Specialist, BSN: Pager [phone]  Health Information Management [place]

## 2012-01-21 NOTE — Progress Notes (Addendum)
[facility] PROGRESS NOTE  [name] WICKLIFFE ZOX:[id number] DOB: 1945-05-09 DOA: 01/19/2012 PCP: No primary provider on file.  Assessment/Plan: 1. HCAP - somewhat improved this am.  treating patient as HCAP. cefepime, levaquin, and vancomycin day #3. Blood cultures pos gm+ cocci in clusters.  pending. Afebrile for last 24 hours.  Hemodynamically stable. Non-toxic appearing.  Oxygen support monitor sats 2. Sepsis - resolved. Fluids discontinued 01/20/12. Hemodynamically stable.   3. Tachycardia - part of #2 syndrome. Resolved  4. COPD. Somewhat improved this am.  On oxygen at home. Scheduled nebs as at home, continue home spiriva and decedron as well as nebs.  5. CKD stage III with acute elevation in creatinine - trending up somewhat. Continue to hold nephrotoxins. Recheck in am 6. Afib: hx of according to chart. Currently in sinus. Continue tele and home meds 7. Chronic systolic heart failure: compensated currently. Will monitor intake and output. Daily weight. Continue home spironolactone and demadex. Fluids discontinued 01/20/12 8. DM uncontrolled.   Appreciate diabetes coordinator assistance. Meal coverage added 01/20/12. Will use SSI and lantus for glycemic control. At home pt takes 70/30 35 units BID. HgA1c in process 9. Anemia: stable.  likely related to chronic disease. No s/sx blding. VSS will monitor and continue supplement 10. HTN fair control: continue home meds. Monitor 11. Anxiety: at baseline. Continue home meds 12. RUL nodule. No change in a spiculated nodular density in the right upper  lobe consistent with benign process given its presence since 2005. 0.6 cm nodule in the RUL new and could represent infection/inflamaiton. If high risk for bronchiocarcinoma repeat CT 6 months   Code Status: full Family Communication: pt at bedside Disposition Plan: snf when ready. Likely tomorrow   Consultants:  none  Procedures:  none  Antibiotics: vanc 01/19/12  levaquin 01/19/12  Cefepime  01/19/12     HPI/Subjective: Sitting on side of bed. NAD  Objective: Filed Vitals:   01/20/12 1441 01/20/12 2048 01/20/12 2112 01/21/12 0601  BP: 137/49 158/78  101/66  Pulse: 97 99  82  Temp: 97.5 F (36.4 C) 98 F (36.7 C)  98 F (36.7 C)  TempSrc: Axillary Oral  Oral  Resp: 18 20  20   Height:      Weight:      SpO2: 97% 98% 96% 97%    Intake/Output Summary (Last 24 hours) at 01/21/12 0754 Last data filed at 01/21/12 0611  Gross per 24 hour  Intake   1840 ml  Output    300 ml  Net   1540 ml   Filed Weights   01/19/12 1700 01/19/12 2207  Weight: 118.2 kg (260 lb 9.3 oz) 116.2 kg (256 lb 2.8 oz)    Exam:   General:  Obese, alert watching TV  Cardiovascular: RRR No MGR trace LEE  Respiratory: normal effort at rest. Slight increased work of breathing with conversation. BS distant with improved air flow. Mild expiratory rhonchi bases. No wheeze  Abdomen: soft obese +BS non-tender to palpation  Data Reviewed: Basic Metabolic Panel:  Lab 01/21/12 8119 01/20/12 0648 01/19/12 1615 01/19/12 1530  NA 134* 136 135 133*  K 4.3 4.4 4.8 4.8  CL 96 100 103 95*  CO2 26 23 -- 25  GLUCOSE 281* 143* 332* 343*  BUN 61* 50* 51* 53*  CREATININE 1.94* 1.66* 2.10* 1.83*  CALCIUM 9.9 9.6 -- 10.1  MG -- -- -- --  PHOS -- -- -- --   Liver Function Tests: No results found for this basename: AST:5,ALT:5,ALKPHOS:5,BILITOT:5,PROT:5,ALBUMIN:5  in the last 168 hours No results found for this basename: LIPASE:5,AMYLASE:5 in the last 168 hours No results found for this basename: AMMONIA:5 in the last 168 hours CBC:  Lab 01/21/12 0558 01/20/12 0648 01/19/12 1615 01/19/12 1530  WBC 7.1 8.5 -- 10.7*  NEUTROABS -- -- -- 9.2*  HGB 9.9* 9.9* 11.9* 10.8*  HCT 30.0* 30.6* 35.0* 33.7*  MCV 86.2 87.2 -- 87.1  PLT 180 158 -- 159   Cardiac Enzymes: No results found for this basename: CKTOTAL:5,CKMB:5,CKMBINDEX:5,TROPONINI:5 in the last 168 hours BNP (last 3 results)  Basename 10/21/11  0455 10/19/11 1453 06/26/11 0526  PROBNP 13824.0* 9736.0* 7258.0*   CBG:  Lab 01/20/12 2107 01/20/12 1808 01/20/12 1704 01/20/12 1252 01/20/12 0803  GLUCAP 287* 492* 505* 363* 145*    Recent Results (from the past 240 hour(s))  CULTURE, BLOOD (ROUTINE X 2)     Status: Normal (Preliminary result)   Collection Time   01/19/12  3:56 PM      Component Value Range Status Comment   Specimen Description BLOOD ARM RIGHT   Final    Special Requests BOTTLES DRAWN AEROBIC ONLY 10CC   Final    Culture  Setup Time 01/20/2012 00:47   Final    Culture     Final    Value: GRAM POSITIVE COCCI IN CLUSTERS     Note: Gram Stain Report Called to,Read Back By and Verified With: Nelda Marseille 01/21/2012 6:30AM YIMSU   Report Status PENDING   Incomplete   URINE CULTURE     Status: Normal   Collection Time   01/19/12  4:37 PM      Component Value Range Status Comment   Specimen Description URINE, CATHETERIZED   Final    Special Requests NONE   Final    Culture  Setup Time 01/19/2012 17:34   Final    Colony Count NO GROWTH   Final    Culture NO GROWTH   Final    Report Status 01/20/2012 FINAL   Final   MRSA PCR SCREENING     Status: Normal   Collection Time   01/20/12 11:25 AM      Component Value Range Status Comment   MRSA by PCR NEGATIVE  NEGATIVE Final      Studies: Dg Chest 2 View  01/19/2012  *RADIOLOGY REPORT*  Clinical Data: Cough, fever.  CHEST - 2 VIEW  Comparison: November 09, 2011.  Findings: Cardiomediastinal silhouette appears normal.  Ill-defined density is seen in the lingular portion of the left lower lobe concerning for pneumonia or atelectasis.  Ill-defined density is seen in right upper lobe concerning for possible pulmonary nodule or mass.  IMPRESSION: Possible left lingular pneumonia or atelectasis.  Right upper lobe density is noted concerning for mass or neoplasm; chest CT is recommended for further evaluation.   Original Report Authenticated By: Lupita Raider.,  M.D.    Ct Chest Wo  Contrast  01/19/2012  *RADIOLOGY REPORT*  Clinical Data: Possible pneumonia.  Abnormal chest x-ray.  CT CHEST WITHOUT CONTRAST  Technique:  Multidetector CT imaging of the chest was performed following the standard protocol without IV contrast.  Comparison: CT chest 09/29/2010 and, 09/29/2010, 07/05/2008 and 02/07/2003.  Plain films of the chest earlier this same day and 11/09/2011.  Findings: Scattered, small mediastinal lymph nodes are unchanged. No new or enlarging lymphadenopathy is identified.  There is no pleural or pericardial effusion.  Spiculated nodular density in the right upper lobe measuring 1.7 cm in diameter on  image 20 is unchanged.  There is a new small nodule in the right upper lobe measuring 0.6 cm in diameter on image 19. There is airspace disease scattered throughout the left upper lobe, most confluent anteriorly.  Confluent airspace opacity is also identified in the right middle lobe.  There are air bronchograms associated with confluent airspace disease.  The lungs are otherwise unremarkable.  Incidentally imaged upper abdomen demonstrates fatty infiltration of the liver.  Visualized intra-abdominal contents are otherwise unremarkable.  There is no focal bony abnormality.  IMPRESSION:  1.  Multifocal pneumonia worst in the right middle and left upper lobes. 2.  No change in a spiculated nodular density in the right upper lobe consistent with benign process given its presence since 2005. 3.  0.6 cm nodule in the right upper lobe is new since the prior studies and likely represents an infectious or inflammatory process although it cannot be definitively characterized. If the patient is at high risk for bronchogenic carcinoma, follow-up chest CT at 6-12 months is recommended.  If the patient is at low risk for bronchogenic carcinoma, follow-up chest CT at 12 months is recommended.  This recommendation follows the consensus statement: Guidelines for Management of Small Pulmonary Nodules Detected  on CT Scans: A Statement from the Fleischner Society as published in Radiology 2005; 237:395-400. 4.  Fatty infiltration of liver.   Original Report Authenticated By: Holley Dexter, M.D.     Scheduled Meds:   . albuterol  2.5 mg Nebulization Q6H  . budesonide-formoterol  2 puff Inhalation BID  . calcitRIOL  0.25 mcg Oral Daily  . ceFEPime (MAXIPIME) IV  1 g Intravenous Q12H  . dexamethasone  4 mg Oral Q breakfast  . DULoxetine  30 mg Oral Daily  . ferrous sulfate  325 mg Oral Q breakfast  . insulin aspart  0-20 Units Subcutaneous TID WC  . insulin aspart  6 Units Subcutaneous TID WC  . insulin glargine  35 Units Subcutaneous QHS  . levofloxacin (LEVAQUIN) IV  750 mg Intravenous Q48H  . multivitamin with minerals  1 tablet Oral Daily  . Rivaroxaban  15 mg Oral Q supper  . spironolactone  25 mg Oral Daily  . tiotropium  18 mcg Inhalation Daily  . torsemide  20 mg Oral BID  . vancomycin  1,500 mg Intravenous Q24H   Continuous Infusions:   Principal Problem:  *HCAP (healthcare-associated pneumonia) Active Problems:  HYPERTENSION  COPD  RESPIRATORY FAILURE, CHRONIC  Systolic CHF, acute on chronic  Pulmonary nodule  Sepsis    Time spent: 30 minutes    [facility]  [facility]  If 8PM-8AM, please contact night-coverage at [email or web address], password [facility] 01/21/2012, 7:54 AM  LOS: 2 days

## 2012-01-21 NOTE — Progress Notes (Signed)
Notified [place], NP that lab called to state that blood culture: aerobic bottle is gram + cocci in clusters. No new orders given. Will continue to monitor patient. Nelda Marseille, RN

## 2012-01-21 NOTE — Progress Notes (Signed)
Pt's IV infiltrated. When asked to put in new IV, pt refused and replied " I go home tomr anyway- I do not want anymore meds going through an IV in my arm, take it out." Give education about finishing abx dosages, pt still refused.

## 2012-01-21 NOTE — Clinical Social Work Note (Signed)
Clinical Social Worker continuing to follow for support and discharge planning needs.  Patient has chosen [facility] and should be ready for discharge on Saturday 01/11.  CSW contacted facility who was agreeable with discharge plans.  Facility weekend supervisor is [place] and he can be reached at [phone].  CSW available for support and to facilitate patient discharge needs once medically stable.  Macario Golds, [place] [phone]

## 2012-01-22 DIAGNOSIS — I10 Essential (primary) hypertension: Secondary | ICD-10-CM

## 2012-01-22 DIAGNOSIS — E119 Type 2 diabetes mellitus without complications: Secondary | ICD-10-CM

## 2012-01-22 DIAGNOSIS — I2589 Other forms of chronic ischemic heart disease: Secondary | ICD-10-CM

## 2012-01-22 LAB — CBC
HCT: 31.5 % — ABNORMAL LOW (ref 36.0–46.0)
MCH: 28 pg (ref 26.0–34.0)
MCHC: 32.4 g/dL (ref 30.0–36.0)
MCV: 86.5 fL (ref 78.0–100.0)
Platelets: 210 10*3/uL (ref 150–400)
RDW: 15 % (ref 11.5–15.5)

## 2012-01-22 LAB — BASIC METABOLIC PANEL
BUN: 74 mg/dL — ABNORMAL HIGH (ref 6–23)
Calcium: 9.9 mg/dL (ref 8.4–10.5)
Creatinine, Ser: 2.01 mg/dL — ABNORMAL HIGH (ref 0.50–1.10)
GFR calc Af Amer: 29 mL/min — ABNORMAL LOW (ref >90)

## 2012-01-22 LAB — GLUCOSE, CAPILLARY
Glucose-Capillary: 225 mg/dL — ABNORMAL HIGH (ref 70–99)
Glucose-Capillary: 229 mg/dL — ABNORMAL HIGH (ref 70–99)

## 2012-01-22 LAB — CULTURE, BLOOD (ROUTINE X 2)

## 2012-01-22 MED ORDER — ALPRAZOLAM 0.25 MG PO TABS: 0.2500 mg | ORAL_TABLET | Freq: Two times a day (BID) | ORAL | Status: AC | PRN

## 2012-01-22 MED ORDER — INSULIN ASPART 100 UNIT/ML ~~LOC~~ SOLN: 15.0000 [IU] | Freq: Three times a day (TID) | SUBCUTANEOUS | Status: AC

## 2012-01-22 MED ORDER — LEVOFLOXACIN 750 MG PO TABS: 750.0000 mg | ORAL_TABLET | Freq: Every day | ORAL | Status: DC

## 2012-01-22 MED ORDER — METOPROLOL TARTRATE 25 MG PO TABS: 25.0000 mg | ORAL_TABLET | Freq: Two times a day (BID) | ORAL | Status: DC

## 2012-01-22 MED ORDER — INSULIN GLARGINE 100 UNIT/ML ~~LOC~~ SOLN: 60.0000 [IU] | Freq: Every day | SUBCUTANEOUS | Status: DC

## 2012-01-22 NOTE — Progress Notes (Signed)
Vancomycin, Levaquin, and cefepime per Rx  Infectious Disease: CXR c/w [facility] --> nursing home resident, treating for HCAP. Tm 97.7  Cefepime 1/8>> Levaquin 1/8>> Vanco 1/8>>  1/8 urine > NEG 1/8 blood x 2 >> 1/2 growing GPC in clusters (contaminate?) 1/10 resp cx >> pending  Plan: 1) Continue cefepime 1g iv q24h and levaquin 750mg  iv q48h for now. 2) Monitor renal function closely. Check a vancomycin trough tonight to reassess dosing

## 2012-01-22 NOTE — Clinical Social Work Note (Signed)
CSW was consulted to complete discharge of patient. Pt to transfer to [facility] today via [facility]. Facility and family are aware of d/c. D/C packet complete with chart copy, signed FL2, and signed hard Rx.  CSW signing off as no other CSW needs identified at this time.  Lia Foyer, LCSWA [facility] Clinical Social Worker Contact #: [phone] (weekend)

## 2012-01-22 NOTE — Discharge Summary (Signed)
Physician Discharge Summary  [name] ZOX:[id number] DOB: 1945/02/11 DOA: 01/19/2012  PCP: No primary provider on file.  Admit date: 01/19/2012 Discharge date: 01/22/2012  Time spent: 40 minutes  Recommendations for Outpatient Follow-up:  1. Followup with primary care physician  Discharge Diagnoses:  Principal Problem:  *HCAP (healthcare-associated pneumonia) Active Problems:  HYPERTENSION  COPD  RESPIRATORY FAILURE, CHRONIC  Systolic CHF, acute on chronic  Pulmonary nodule  Sepsis   Discharge Condition: Stable  Diet recommendation: Carbohydrate modified diet  Filed Weights   01/19/12 1700 01/19/12 2207 01/22/12 0519  Weight: 118.2 kg (260 lb 9.3 oz) 116.2 kg (256 lb 2.8 oz) 115.6 kg (254 lb 13.6 oz)    History of present illness:  [name] is a 67 y.o. female who presents with complaint of back pain, wet sounding cough, mild SOB, dysuria, all onset 3 days ago. Back pain is worse with movement, no abdominal pain. Nothing seems to make her back pain better. She has also been running a fever. She does have a recent history of HCAP requiring intubation back in October of this year.  In the ED patient was found to have LLL lingula PNA, HCAP given that she is from a nursing home. There is also an ill-defined density in the RUL concerning for mass (CT recommended). Patient also had fever of 103, leukocytosis, creatinine slightly elevated from her baseline of 1.8 (was 2.1). Tachycardia, and mild tachypnea which improved with O2. Hospitalist has been asked to admit for HCAP with sepsis.  Hospital Course:   1. Pneumonia: This is treated as a health care associated pneumonia, started on cefepime, vancomycin and Levaquin on the day of admission, she had 4 days of these IV antibiotics. On the day of discharge antibiotics switched to oral Levaquin to complete 10 days of antibiotics. Supportive management including antitussives, mucolytics and oxygen as needed. Patient is nontoxic  appearing of the time of admission and she felt much better, and she is appropriate for discharge. She is back to her baseline of oxygen needs.  2. COPD/chronic respiratory failure: Patient is on continuous oxygen at home, also she is on Spiriva. It's been reported that she is on Decadron for her COPD? Her preadmission home regimen continued throughout the hospital stay.  3. Positive blood culture: Patient had 1/2 positive blood culture for GPC, the culture showed coagulase-negative staph, with the patient does not look that sick and nontoxic this is probably a contamination. Anyway patient will have 10 days worth of Levaquin.  4. Diabetes mellitus type 2: Her hemoglobin A1c is 10.3 which correlate with mean plasma glucose of 250. Patient is on 70/30 Humulin mix. Her insulin on discharge switched to Lantus insulin and NovoLog before meal coverage. Patient is still going to need titration of her Lantus and NovoLog insulin according to the CBG results. Please titrate up as appropriate.  5. CKD stage III: Patient has baseline creatinine of 1.9, at the time of discharge her creatinine is 2.0. Patient is on Aldactone and Demadex for chronic CHF and fluid retention. Renal function should be followed closely.  5. Chronic systolic CHF: Per 2-D echo done on 10/27/2011 her LVEF is 20-25%. Patient is on his power tone and Demadex, I added metoprolol 25 mg twice a day, she does have COPD not reactive airway disease, she should tolerate metoprolol. Patient is not on ACE I or ARB because of unstable/worsening renal function, patient should be evaluated as outpatient for that as she needs it for both diabetic nephropathy  and her nonischemic cardiomyopathy.  6. RUL nodule: Chest x-ray recommended CT scan, CT scan was done and showed no change in the right upper lobe nodule since 2005, probably its a benign process.  7. Atrial fibrillation: Patient currently on sinus rhythm, she is on Xarelto, followup with Dr.  Gala Romney.  Procedures:  None  Consultations:  None  Discharge Exam: Filed Vitals:   01/21/12 2011 01/21/12 2131 01/22/12 0519 01/22/12 0938  BP:  153/69 132/72   Pulse:  92 65   Temp:  97.7 F (36.5 C) 97.7 F (36.5 C)   TempSrc:  Oral Oral   Resp:  20 18   Height:      Weight:   115.6 kg (254 lb 13.6 oz)   SpO2: 99% 95% 98% 94%   General: Alert and awake, oriented x3, not in any acute distress. HEENT: anicteric sclera, pupils reactive to light and accommodation, EOMI CVS: S1-S2 clear, no murmur rubs or gallops Chest: clear to auscultation bilaterally, no wheezing, rales or rhonchi Abdomen: soft nontender, nondistended, normal bowel sounds, no organomegaly Extremities: no cyanosis, clubbing or edema noted bilaterally Neuro: Cranial nerves II-XII intact, no focal neurological deficits  Discharge Instructions  Discharge Orders    Future Orders Please Complete By Expires   Diet - low sodium heart healthy      Diet Carb Modified          Medication List     As of 01/22/2012 11:52 AM    STOP taking these medications         NOVOLOG MIX 70/30 FLEXPEN (70-30) 100 UNIT/ML injection   Generic drug: insulin aspart protamine-insulin aspart      TAKE these medications         acetaminophen 500 MG tablet   Commonly known as: TYLENOL   Take 1,000 mg by mouth every 6 (six) hours as needed. For pain      albuterol 108 (90 BASE) MCG/ACT inhaler   Commonly known as: PROVENTIL HFA;VENTOLIN HFA   Inhale 2 puffs into the lungs every 6 (six) hours as needed.      albuterol (2.5 MG/3ML) 0.083% nebulizer solution   Commonly known as: PROVENTIL   Take 2.5 mg by nebulization every 6 (six) hours as needed. For shortness of breath      ALPRAZolam 0.25 MG tablet   Commonly known as: XANAX   Take 1 tablet (0.25 mg total) by mouth 2 (two) times daily as needed for sleep.      budesonide-formoterol 160-4.5 MCG/ACT inhaler   Commonly known as: SYMBICORT   Inhale 2 puffs into the  lungs 2 (two) times daily.      calcitRIOL 0.25 MCG capsule   Commonly known as: ROCALTROL   Take 0.25 mcg by mouth daily.      dexamethasone 4 MG tablet   Commonly known as: DECADRON   Take 4 mg by mouth daily with breakfast.      DULoxetine 30 MG capsule   Commonly known as: CYMBALTA   Take 30 mg by mouth daily.      ferrous sulfate 325 (65 FE) MG tablet   Take 325 mg by mouth daily with breakfast.      insulin aspart 100 UNIT/ML injection   Commonly known as: novoLOG   Inject 15 Units into the skin 3 (three) times daily with meals.      insulin glargine 100 UNIT/ML injection   Commonly known as: LANTUS   Inject 60 Units into the skin at  bedtime.      levofloxacin 750 MG tablet   Commonly known as: LEVAQUIN   Take 1 tablet (750 mg total) by mouth daily.      metoprolol tartrate 25 MG tablet   Commonly known as: LOPRESSOR   Take 1 tablet (25 mg total) by mouth 2 (two) times daily.      multivitamin tablet   Take 1 tablet by mouth daily.      Rivaroxaban 15 MG Tabs tablet   Commonly known as: XARELTO   Take 15 mg by mouth daily.      spironolactone 25 MG tablet   Commonly known as: ALDACTONE   Take 25 mg by mouth daily.      tiotropium 18 MCG inhalation capsule   Commonly known as: SPIRIVA   Place 18 mcg into inhaler and inhale daily.      torsemide 20 MG tablet   Commonly known as: DEMADEX   Take 20 mg by mouth 2 (two) times daily.           Follow-up Information    Follow up with Romero Belling, MD. In 2 weeks.   Contact information:   [address] [place] [address] [phone]           The results of significant diagnostics from this hospitalization (including imaging, microbiology, ancillary and laboratory) are listed below for reference.    Significant Diagnostic Studies: Dg Chest 2 View  01/19/2012  *RADIOLOGY REPORT*  Clinical Data: Cough, fever.  CHEST - 2 VIEW  Comparison: November 09, 2011.  Findings:  Cardiomediastinal silhouette appears normal.  Ill-defined density is seen in the lingular portion of the left lower lobe concerning for pneumonia or atelectasis.  Ill-defined density is seen in right upper lobe concerning for possible pulmonary nodule or mass.  IMPRESSION: Possible left lingular pneumonia or atelectasis.  Right upper lobe density is noted concerning for mass or neoplasm; chest CT is recommended for further evaluation.   Original Report Authenticated By: Lupita Raider.,  M.D.    Ct Chest Wo Contrast  01/19/2012  *RADIOLOGY REPORT*  Clinical Data: Possible pneumonia.  Abnormal chest x-ray.  CT CHEST WITHOUT CONTRAST  Technique:  Multidetector CT imaging of the chest was performed following the standard protocol without IV contrast.  Comparison: CT chest 09/29/2010 and, 09/29/2010, 07/05/2008 and 02/07/2003.  Plain films of the chest earlier this same day and 11/09/2011.  Findings: Scattered, small mediastinal lymph nodes are unchanged. No new or enlarging lymphadenopathy is identified.  There is no pleural or pericardial effusion.  Spiculated nodular density in the right upper lobe measuring 1.7 cm in diameter on image 20 is unchanged.  There is a new small nodule in the right upper lobe measuring 0.6 cm in diameter on image 19. There is airspace disease scattered throughout the left upper lobe, most confluent anteriorly.  Confluent airspace opacity is also identified in the right middle lobe.  There are air bronchograms associated with confluent airspace disease.  The lungs are otherwise unremarkable.  Incidentally imaged upper abdomen demonstrates fatty infiltration of the liver.  Visualized intra-abdominal contents are otherwise unremarkable.  There is no focal bony abnormality.  IMPRESSION:  1.  Multifocal pneumonia worst in the right middle and left upper lobes. 2.  No change in a spiculated nodular density in the right upper lobe consistent with benign process given its presence since 2005.  3.  0.6 cm nodule in the right upper lobe is new since the prior  studies and likely represents an infectious or inflammatory process although it cannot be definitively characterized. If the patient is at high risk for bronchogenic carcinoma, follow-up chest CT at 6-12 months is recommended.  If the patient is at low risk for bronchogenic carcinoma, follow-up chest CT at 12 months is recommended.  This recommendation follows the consensus statement: Guidelines for Management of Small Pulmonary Nodules Detected on CT Scans: A Statement from the Fleischner Society as published in Radiology 2005; 237:395-400. 4.  Fatty infiltration of liver.   Original Report Authenticated By: Holley Dexter, M.D.     Microbiology: Recent Results (from the past 240 hour(s))  CULTURE, BLOOD (ROUTINE X 2)     Status: Normal (Preliminary result)   Collection Time   01/19/12  3:36 PM      Component Value Range Status Comment   Specimen Description BLOOD ARM RIGHT   Final    Special Requests BOTTLES DRAWN AEROBIC AND ANAEROBIC 10CC   Final    Culture  Setup Time 01/20/2012 00:47   Final    Culture     Final    Value:        BLOOD CULTURE RECEIVED NO GROWTH TO DATE CULTURE WILL BE HELD FOR 5 DAYS BEFORE ISSUING A FINAL NEGATIVE REPORT   Report Status PENDING   Incomplete   CULTURE, BLOOD (ROUTINE X 2)     Status: Normal   Collection Time   01/19/12  3:56 PM      Component Value Range Status Comment   Specimen Description BLOOD ARM RIGHT   Final    Special Requests BOTTLES DRAWN AEROBIC ONLY 10CC   Final    Culture  Setup Time 01/20/2012 00:47   Final    Culture     Final    Value: STAPHYLOCOCCUS SPECIES (COAGULASE NEGATIVE)     Note: THE SIGNIFICANCE OF ISOLATING THIS ORGANISM FROM A SINGLE SET OF BLOOD CULTURES WHEN MULTIPLE SETS ARE DRAWN IS UNCERTAIN. PLEASE NOTIFY THE MICROBIOLOGY DEPARTMENT WITHIN ONE WEEK IF SPECIATION AND SENSITIVITIES ARE REQUIRED.     Note: Gram Stain Report Called to,Read Back By and Verified  With: JENNY THACKER 01/21/2012 6:30AM YIMSU   Report Status 01/22/2012 FINAL   Final   URINE CULTURE     Status: Normal   Collection Time   01/19/12  4:37 PM      Component Value Range Status Comment   Specimen Description URINE, CATHETERIZED   Final    Special Requests NONE   Final    Culture  Setup Time 01/19/2012 17:34   Final    Colony Count NO GROWTH   Final    Culture NO GROWTH   Final    Report Status 01/20/2012 FINAL   Final   MRSA PCR SCREENING     Status: Normal   Collection Time   01/20/12 11:25 AM      Component Value Range Status Comment   MRSA by PCR NEGATIVE  NEGATIVE Final   CULTURE, EXPECTORATED SPUTUM-ASSESSMENT     Status: Normal   Collection Time   01/21/12  6:25 PM      Component Value Range Status Comment   Specimen Description SPUTUM   Final    Special Requests NONE   Final    Sputum evaluation     Final    Value: THIS SPECIMEN IS ACCEPTABLE. RESPIRATORY CULTURE REPORT TO FOLLOW.   Report Status 01/21/2012 FINAL   Final   CULTURE, RESPIRATORY     Status: Normal (Preliminary  result)   Collection Time   01/21/12  6:25 PM      Component Value Range Status Comment   Specimen Description SPUTUM   Final    Special Requests NONE   Final    Gram Stain     Final    Value: MODERATE WBC PRESENT,BOTH PMN AND MONONUCLEAR     FEW SQUAMOUS EPITHELIAL CELLS PRESENT     NO ORGANISMS SEEN   Culture PENDING   Incomplete    Report Status PENDING   Incomplete      Labs: Basic Metabolic Panel:  Lab 01/22/12 1610 01/21/12 1820 01/21/12 0558 01/20/12 0648 01/19/12 1615 01/19/12 1530  NA 133* -- 134* 136 135 133*  K 4.4 -- 4.3 4.4 4.8 4.8  CL 93* -- 96 100 103 95*  CO2 28 -- 26 23 -- 25  GLUCOSE 270* 512* 281* 143* 332* --  BUN 74* -- 61* 50* 51* 53*  CREATININE 2.01* -- 1.94* 1.66* 2.10* 1.83*  CALCIUM 9.9 -- 9.9 9.6 -- 10.1  MG -- -- -- -- -- --  PHOS -- -- -- -- -- --   Liver Function Tests: No results found for this basename:  AST:5,ALT:5,ALKPHOS:5,BILITOT:5,PROT:5,ALBUMIN:5 in the last 168 hours No results found for this basename: LIPASE:5,AMYLASE:5 in the last 168 hours No results found for this basename: AMMONIA:5 in the last 168 hours CBC:  Lab 01/22/12 0631 01/21/12 0558 01/20/12 0648 01/19/12 1615 01/19/12 1530  WBC 9.0 7.1 8.5 -- 10.7*  NEUTROABS -- -- -- -- 9.2*  HGB 10.2* 9.9* 9.9* 11.9* 10.8*  HCT 31.5* 30.0* 30.6* 35.0* 33.7*  MCV 86.5 86.2 87.2 -- 87.1  PLT 210 180 158 -- 159   Cardiac Enzymes: No results found for this basename: CKTOTAL:5,CKMB:5,CKMBINDEX:5,TROPONINI:5 in the last 168 hours BNP: BNP (last 3 results)  Basename 10/21/11 0455 10/19/11 1453 06/26/11 0526  PROBNP 13824.0* 9736.0* 7258.0*   CBG:  Lab 01/22/12 0746 01/21/12 2240 01/21/12 1718 01/21/12 1210 01/21/12 0751  GLUCAP 225* 231* 512* 230* 311*       Signed:  Robertson Colclough A  [facility] 01/22/2012, 11:52 AM

## 2012-01-24 LAB — CULTURE, RESPIRATORY W GRAM STAIN

## 2012-01-26 LAB — CULTURE, BLOOD (ROUTINE X 2): Culture: NO GROWTH

## 2012-02-01 ENCOUNTER — Encounter (HOSPITAL_COMMUNITY): Payer: Medicare Other

## 2012-02-11 ENCOUNTER — Ambulatory Visit (HOSPITAL_COMMUNITY)
Admission: RE | Admit: 2012-02-11 | Discharge: 2012-02-11 | Disposition: A | Discharge status: Home / Self Care | Payer: Medicare Other | Source: Ambulatory Visit | Attending: Internal Medicine | Admitting: Internal Medicine

## 2012-02-11 VITALS — BP 128/56 | HR 117 | Wt 274.2 lb

## 2012-02-11 DIAGNOSIS — I5023 Acute on chronic systolic (congestive) heart failure: Secondary | ICD-10-CM

## 2012-02-11 DIAGNOSIS — L0390 Cellulitis, unspecified: Secondary | ICD-10-CM | POA: Insufficient documentation

## 2012-02-11 DIAGNOSIS — L0291 Cutaneous abscess, unspecified: Secondary | ICD-10-CM

## 2012-02-11 NOTE — Progress Notes (Signed)
Weight Range      Baseline proBNP   1715    HPI:  67 y/o woman with multiple medical problems including morbid obesity, COPD on 4L home O2, DM2, HTN and HL.  Systolic HF secondary to NICM with EF 20-25% and CRI with baseline Cr 1.5. She has had multiple admissions for respiratory failure.   Echo 03/24/11: LVEF 30-35% with mild concentric hypertrophy.  Mild MR.  LA mildly dilated.  Myoview 03/25/11:  No evidence for inducible ischemia. Moderate inferolateral and small distal anteroseptal fixed defects are identified and may be related to scar.  Inferior and mid/distal inferolateral hypokinesia extending into the apex.  Left ventricular ejection fraction of 35%. Cath deferred as she was not candidate for CABG (due to COPD) and not having angina to warrant PCI if lesion was found. Opted for medical therapy.   Recently admitted for CP and progressive dyspnea and found to be in respiratory failure requiring intubation x2.  She diuresed 25 pounds on IV lasix and nesiritide.  Repeat echo 10/16 did not show the RV well but EF was 20-25% with global hypokinesis. (RV not terrible. Probably moderately dilated and mildly HK).  Underwent LHC on 10/15 showing minimal nonobstructive CAD.  She was discharged to [facility] for ~2weeks.  10/26/11 LHC: Left main: Normal, LAD: Large vessel wrapped the apex. Diffuse 20-30% stenosis in mid to distal vessel, LCX: Normal, RCA: Large dominant vessel. Normal  Labs: 12/10/11: K 4.4, Cl 103, CO2 32, Cr 1.53  First week of December. R and LLE Doppler completed at SNF  - Negative for DVT  Admitted in Jan 2014 for HCAP, completed abx and discharged to [facility].    Her weight is up 20 pounds since discharge on 1/11.  Breathing worse with movement.  +cough with brown/green sputum.  +orthopnea/PND.  Cant get comfortable at night.  Not adding any salt to food.  Says she is not drinking 2 L a day.  No fever/chills.  Redness lower extremity and painful.  Wears  continuous O2.  Leg weeping. Started IV lasix 40 bid yesterday with decent response.      ROS: All systems negative except as listed in HPI, PMH and Problem List.  Past Medical History  Diagnosis Date  . RENAL INSUFFICIENCY 10/15/2008  . Edema 06/18/2008  . PNEUMONIA 12/01/2007  . CARPAL TUNNEL SYNDROME, BILATERAL 10/30/2007  . HAND PAIN, LEFT 09/04/2007  . Morbid obesity   . ANXIETY 11/08/2006  . HYPERTENSION 11/08/2006  . DIABETES MELLITUS, TYPE II 11/08/2006  . HYPERCHOLESTEROLEMIA 07/24/2009  . ANEMIA 07/24/2009  . COPD 11/08/2006    -HFA 50% p coaching 10/15/2009  . RESPIRATORY FAILURE, CHRONIC 10/15/2009  . BACK PAIN, LUMBAR 03/07/2009  . CHEST PAIN 07/24/2009  . Restless leg syndrome   . Systolic CHF, acute 03/24/2011  . Ischemic cardiomyopathy 03/26/2011  . A-fib     Current Outpatient Prescriptions  Medication Sig Dispense Refill  . acetaminophen (TYLENOL) 500 MG tablet Take 1,000 mg by mouth every 6 (six) hours as needed. For pain      . albuterol (PROVENTIL HFA;VENTOLIN HFA) 108 (90 BASE) MCG/ACT inhaler Inhale 2 puffs into the lungs every 6 (six) hours as needed.      Marland Kitchen albuterol (PROVENTIL) (2.5 MG/3ML) 0.083% nebulizer solution Take 2.5 mg by nebulization every 6 (six) hours as needed. For shortness of breath      . ALPRAZolam (XANAX) 0.25 MG tablet Take 1 tablet (0.25 mg total) by mouth 2 (two) times  daily as needed for sleep.  20 tablet  0  . budesonide-formoterol (SYMBICORT) 160-4.5 MCG/ACT inhaler Inhale 2 puffs into the lungs 2 (two) times daily.      . calcitRIOL (ROCALTROL) 0.25 MCG capsule Take 0.25 mcg by mouth daily.        . DULoxetine (CYMBALTA) 30 MG capsule Take 30 mg by mouth daily.      . ferrous sulfate 325 (65 FE) MG tablet Take 325 mg by mouth daily with breakfast.      . guaiFENesin (MUCINEX) 600 MG 12 hr tablet Take 600 mg by mouth 2 (two) times daily.      . insulin aspart (NOVOLOG) 100 UNIT/ML injection Inject 15 Units into the skin 3 (three) times  daily with meals.      . insulin glargine (LANTUS) 100 UNIT/ML injection Inject 60 Units into the skin at bedtime.      . metoprolol tartrate (LOPRESSOR) 25 MG tablet Take 1 tablet (25 mg total) by mouth 2 (two) times daily.      . Multiple Vitamin (MULTIVITAMIN) tablet Take 1 tablet by mouth daily.       . promethazine (PHENERGAN) 25 MG tablet Take 25 mg by mouth every 6 (six) hours as needed.      . Rivaroxaban (XARELTO) 15 MG TABS tablet Take 15 mg by mouth daily.      . sodium chloride 0.9 % SOLN 100 mL with furosemide 10 MG/ML SOLN Inject 40 mg into the vein 2 (two) times daily.      Marland Kitchen spironolactone (ALDACTONE) 25 MG tablet Take 25 mg by mouth daily.      Marland Kitchen tiotropium (SPIRIVA) 18 MCG inhalation capsule Place 18 mcg into inhaler and inhale daily.      . traMADol (ULTRAM) 50 MG tablet Take 50 mg by mouth 4 (four) times daily as needed.      . [DISCONTINUED] cloNIDine (CATAPRES) 0.1 MG tablet Take 0.1 mg by mouth 3 (three) times daily.       . [DISCONTINUED] famotidine (PEPCID) 20 MG tablet Take 20 mg by mouth daily as needed. For heartburn.         PHYSICAL EXAM: Filed Vitals:   02/11/12 0857  BP: 128/56  Pulse: 117  Weight: 274 lb 4 oz (124.399 kg)  SpO2: 91%    General:  Chronically ill appearing. No resp difficulty HEENT: normal Neck: supple. JVP difficult to see. Carotids 2+ bilaterally; no bruits. No lymphadenopathy or thryomegaly appreciated. Cor: PMI normal. Regular rate & rhythm. No rubs, gallops or murmurs. Lungs: RLL decreased on 2 liters [place] Abdomen: obese, soft, nontender, nondistended. No hepatosplenomegaly. No bruits or masses. Good bowel sounds. Extremities: no cyanosis, clubbing, rash, 3+ edema with erythema of lower extremities bilaterally.LLE weeping Neuro: alert & orientedx3, cranial nerves grossly intact. Moves all 4 extremities w/o difficulty. Affect pleasant.    ASSESSMENT & PLAN:

## 2012-02-11 NOTE — Assessment & Plan Note (Signed)
Start doxycycline 100 bid.

## 2012-02-11 NOTE — Assessment & Plan Note (Signed)
She has marked volume overload with skin breakdown. Would increase lasix to 80 IV bid. Add metolazone 2.5mg  daily. Continue spironolactone 25mg  daily. Add Kcl 20 meq bid. Recheck labs today and Monday. Will see back next Tuesday. If no improvement will need to be admitted.

## 2012-02-15 ENCOUNTER — Ambulatory Visit (HOSPITAL_COMMUNITY): Admission: RE | Admit: 2012-02-15 | Payer: Medicare Other | Source: Ambulatory Visit

## 2012-03-07 ENCOUNTER — Encounter (HOSPITAL_COMMUNITY): Payer: Self-pay

## 2012-03-07 ENCOUNTER — Ambulatory Visit (HOSPITAL_COMMUNITY)
Admission: RE | Admit: 2012-03-07 | Discharge: 2012-03-07 | Disposition: A | Discharge status: Home / Self Care | Payer: Medicare Other | Source: Ambulatory Visit | Attending: Internal Medicine | Admitting: Internal Medicine

## 2012-03-07 VITALS — BP 142/68 | HR 110 | Wt 268.0 lb

## 2012-03-07 DIAGNOSIS — I509 Heart failure, unspecified: Secondary | ICD-10-CM

## 2012-03-07 DIAGNOSIS — I5022 Chronic systolic (congestive) heart failure: Secondary | ICD-10-CM

## 2012-03-07 MED ORDER — METOLAZONE 2.5 MG PO TABS: 2.5000 mg | ORAL_TABLET | Freq: Every day | ORAL | Status: DC

## 2012-03-07 MED ORDER — TORSEMIDE 20 MG PO TABS: 40.0000 mg | ORAL_TABLET | Freq: Two times a day (BID) | ORAL | Status: AC

## 2012-03-07 NOTE — Assessment & Plan Note (Signed)
She returns for follow up. Volume status slightly better but remains elevated. Increase Torsemide to 40 mg bid and add Metolazone 2.5 mg every Tuesday and Saturday. Reinforced low salt food choices. Provided order to SNF to give 2 gram salt restricted diet. SNF to check BMET and fax results. Follow up in 3 weeks to reassess volume status.

## 2012-03-07 NOTE — Progress Notes (Signed)
Patient ID: [name], female   DOB: 1945-06-02, 67 y.o.   MRN: [id number]  Weight Range      Baseline proBNP   1715    HPI:  67 y/o woman with multiple medical problems including morbid obesity, COPD on 4L home O2, DM2, HTN and HL.  Systolic HF secondary to NICM with EF 20-25% and CRI with baseline Cr 1.5. She has had multiple admissions for respiratory failure.   Echo 03/24/11: LVEF 30-35% with mild concentric hypertrophy.  Mild MR.  LA mildly dilated.  Myoview 03/25/11:  No evidence for inducible ischemia. Moderate inferolateral and small distal anteroseptal fixed defects are identified and may be related to scar.  Inferior and mid/distal inferolateral hypokinesia extending into the apex.  Left ventricular ejection fraction of 35%. Cath deferred as she was not candidate for CABG (due to COPD) and not having angina to warrant PCI if lesion was found. Opted for medical therapy.   Admitted for CP and progressive dyspnea and found to be in respiratory failure requiring intubation x2 10/2011.  She diuresed 25 pounds on IV lasix and nesiritide.  Repeat echo 10/16 did not show the RV well but EF was 20-25% with global hypokinesis. (RV not terrible. Probably moderately dilated and mildly HK).  Underwent LHC on 10/15 showing minimal nonobstructive CAD.  She was discharged to [facility] for ~2weeks.  10/26/11 LHC: Left main: Normal, LAD: Large vessel wrapped the apex. Diffuse 20-30% stenosis in mid to distal vessel, LCX: Normal, RCA: Large dominant vessel. Normal  Admitted in Jan 2014 for HCAP, completed abx and discharged to [facility].    02/10/12 Pro BNP 3780 03/02/12 Pro BNP 5260  She returns for follow up. Last visit volume increased. She received Lasix 80 mg IM for 4 days and transitioned to Demadex 30 mg bid. Also Spironolactone was increased to 75 mg daily. Per SNF she has not been compliant with fluid restrictions. Weight has trended down from 274 to 269 pounds. She  continues on 2 liters [place]. Essentially wheelchair bound with only transfers. Eating high salt food such as soups and barbeque. Lives at [facility].      ROS: All systems negative except as listed in HPI, PMH and Problem List.  Past Medical History  Diagnosis Date  . RENAL INSUFFICIENCY 10/15/2008  . Edema 06/18/2008  . PNEUMONIA 12/01/2007  . CARPAL TUNNEL SYNDROME, BILATERAL 10/30/2007  . HAND PAIN, LEFT 09/04/2007  . Morbid obesity   . ANXIETY 11/08/2006  . HYPERTENSION 11/08/2006  . DIABETES MELLITUS, TYPE II 11/08/2006  . HYPERCHOLESTEROLEMIA 07/24/2009  . ANEMIA 07/24/2009  . COPD 11/08/2006    -HFA 50% p coaching 10/15/2009  . RESPIRATORY FAILURE, CHRONIC 10/15/2009  . BACK PAIN, LUMBAR 03/07/2009  . CHEST PAIN 07/24/2009  . Restless leg syndrome   . Systolic CHF, acute 03/24/2011  . Ischemic cardiomyopathy 03/26/2011  . A-fib     Current Outpatient Prescriptions  Medication Sig Dispense Refill  . acetaminophen (TYLENOL) 500 MG tablet Take 1,000 mg by mouth every 6 (six) hours as needed. For pain      . albuterol (PROVENTIL HFA;VENTOLIN HFA) 108 (90 BASE) MCG/ACT inhaler Inhale 2 puffs into the lungs every 6 (six) hours as needed.      Marland Kitchen albuterol (PROVENTIL) (2.5 MG/3ML) 0.083% nebulizer solution Take 2.5 mg by nebulization every 6 (six) hours as needed. For shortness of breath      . ALPRAZolam (XANAX) 0.25 MG tablet Take 1 tablet (0.25  mg total) by mouth 2 (two) times daily as needed for sleep.  20 tablet  0  . budesonide-formoterol (SYMBICORT) 160-4.5 MCG/ACT inhaler Inhale 2 puffs into the lungs 2 (two) times daily.      . calcitRIOL (ROCALTROL) 0.25 MCG capsule Take 0.25 mcg by mouth daily.        . DULoxetine (CYMBALTA) 30 MG capsule Take 30 mg by mouth daily.      . ferrous sulfate 325 (65 FE) MG tablet Take 325 mg by mouth daily with breakfast.      . insulin aspart (NOVOLOG) 100 UNIT/ML injection Inject 15 Units into the skin 3 (three) times daily with  meals.      . insulin glargine (LANTUS) 100 UNIT/ML injection Inject 60 Units into the skin at bedtime.      . metoprolol tartrate (LOPRESSOR) 25 MG tablet Take 1 tablet (25 mg total) by mouth 2 (two) times daily.      . Multiple Vitamin (MULTIVITAMIN) tablet Take 1 tablet by mouth daily.       . promethazine (PHENERGAN) 25 MG tablet Take 25 mg by mouth every 6 (six) hours as needed.      . Rivaroxaban (XARELTO) 15 MG TABS tablet Take 15 mg by mouth daily.      Marland Kitchen spironolactone (ALDACTONE) 25 MG tablet Take 75 mg by mouth daily.       Marland Kitchen tiotropium (SPIRIVA) 18 MCG inhalation capsule Place 18 mcg into inhaler and inhale daily.      Marland Kitchen torsemide (DEMADEX) 20 MG tablet Take 30 mg by mouth 2 (two) times daily.      . traMADol (ULTRAM) 50 MG tablet Take 50 mg by mouth 4 (four) times daily as needed.      . [DISCONTINUED] cloNIDine (CATAPRES) 0.1 MG tablet Take 0.1 mg by mouth 3 (three) times daily.       . [DISCONTINUED] famotidine (PEPCID) 20 MG tablet Take 20 mg by mouth daily as needed. For heartburn.       No current facility-administered medications for this encounter.     PHYSICAL EXAM: Filed Vitals:   03/07/12 1328  BP: 142/68  Pulse: 110  Weight: 268 lb (121.564 kg)  SpO2: 91%   Weight 274>268 pounds  General:  Chronically ill appearing in wheelchair. No resp difficulty HEENT: normal Neck: supple. JVP difficult to see. Carotids 2+ bilaterally; no bruits. No lymphadenopathy or thryomegaly appreciated. Cor: PMI normal. Regular rate & rhythm. No rubs, gallops or murmurs. Lungs: Clear decreased on 2 liters [place] Abdomen: obese, soft, nontender, nondistended. No hepatosplenomegaly. No bruits or masses. Good bowel sounds. Extremities: no cyanosis, clubbing, rash, 3+ edema with erythema of lower extremities bilaterally. Neuro: alert & orientedx3, cranial nerves grossly intact. Moves all 4 extremities w/o difficulty. Affect pleasant.    ASSESSMENT & PLAN:

## 2012-03-28 ENCOUNTER — Encounter (HOSPITAL_COMMUNITY): Payer: Medicare Other

## 2012-03-29 ENCOUNTER — Ambulatory Visit (HOSPITAL_COMMUNITY): Payer: Medicare Other

## 2012-05-15 ENCOUNTER — Encounter (HOSPITAL_COMMUNITY): Payer: Medicare Other

## 2012-05-18 ENCOUNTER — Encounter (HOSPITAL_COMMUNITY): Payer: Medicare Other

## 2012-05-24 ENCOUNTER — Encounter: Payer: Self-pay | Admitting: Internal Medicine

## 2012-05-24 ENCOUNTER — Ambulatory Visit: Payer: Medicare Other | Admitting: Internal Medicine

## 2012-05-24 ENCOUNTER — Ambulatory Visit (INDEPENDENT_AMBULATORY_CARE_PROVIDER_SITE_OTHER): Payer: Medicare Other | Admitting: Internal Medicine

## 2012-05-24 VITALS — BP 112/60 | HR 104 | Temp 98.9°F | Ht 66.5 in | Wt 253.0 lb

## 2012-05-24 DIAGNOSIS — J9610 Chronic respiratory failure, unspecified whether with hypoxia or hypercapnia: Secondary | ICD-10-CM

## 2012-05-24 DIAGNOSIS — R918 Other nonspecific abnormal finding of lung field: Secondary | ICD-10-CM

## 2012-05-24 DIAGNOSIS — R9389 Abnormal findings on diagnostic imaging of other specified body structures: Secondary | ICD-10-CM

## 2012-05-24 DIAGNOSIS — J449 Chronic obstructive pulmonary disease, unspecified: Secondary | ICD-10-CM

## 2012-05-24 MED ORDER — PREDNISONE (PAK) 10 MG PO TABS: ORAL_TABLET | ORAL | Status: DC

## 2012-05-24 MED ORDER — AMOXICILLIN-POT CLAVULANATE 875-125 MG PO TABS: 1.0000 | ORAL_TABLET | Freq: Two times a day (BID) | ORAL | Status: DC

## 2012-05-24 NOTE — Patient Instructions (Addendum)
Prednisone 10 mg take  4 each am x 2 days,   2 each am x 2 days,  1 each am x2days and stop   Augmentin 875 twice daily x 10 days  You will need a cxr in 10 days and ok to proceed with surgery if clear, if not you need to return here before your procedure

## 2012-05-24 NOTE — Progress Notes (Signed)
Subjective:    Patient ID: [name], female    DOB: 1945/01/19, 67 y.o.   MRN: [id number]  Brief patient profile:  45 yowf with morbid obesity/ aodm variably quits smoking  with known hx of COPD- O2 dependent     10/30/2010 f/u ov/[name] off cigarettes and off predisone on 02 4lpm 24 hours cc doe x 50 ft,  No cough, not using neb but has proair twice daily with doe x 25 ft. Only new c/o is increased leg swelling and has f/u with renal scheduled w/in the next week rec Work on inhaler technique   Double furosemide to where you take it 2 each am until swelling better, then one daily thereafter  Please schedule a follow up office visit in 4 weeks, sooner if needed pfts  07/06/2011 no show   05/24/2012 f/u ov/[name] re preop clearance, quit smoking 01/2008 snf [place] on 02 2-3lpm 24/7 Chief Complaint  Patient presents with  . Follow-up    Pt here for pulmonary clearance for cataract surgery. She states her breathing is overall doing well, but has been coughing up large amounts of green sputum x 2 days.    doe x 50 ft, some nasal congestion, no need for daytime saba.  No obvious daytime variabilty or assoc c r cp or chest tightness, subjective wheeze overt sinus or hb symptoms. No unusual exp hx or h/o childhood pna/ asthma or premature birth to her knowledge.   Sleeping ok on side and on 02 without nocturnal  or early am exacerbation  of respiratory  C/o's or HA/ drowsiness or need for noct saba. Also denies any obvious fluctuation of symptoms with weather or environmental changes or other aggravating or alleviating factors except as outlined above   Current Medications, Allergies, Past Medical History, Past Surgical History, Family History, and Social History were reviewed in [employer] record.  ROS  The following are not active complaints unless bolded sore throat, dysphagia, dental problems, itching, sneezing,  nasal congestion or excess/ purulent secretions, ear  ache,   fever, chills, sweats, unintended wt loss, pleuritic or exertional cp, hemoptysis,  orthopnea pnd or leg swelling, presyncope, palpitations, heartburn, abdominal pain, anorexia, nausea, vomiting, diarrhea  or change in bowel or urinary habits, change in stools or urine, dysuria,hematuria,  rash, arthralgias, visual complaints, headache, numbness weakness or ataxia or problems with walking or coordination,  change in mood/affect or memory.            Objective:   Physical Exam  Wt Readings from Last 3 Encounters:  05/24/12 253 lb (114.76 kg)  03/07/12 268 lb (121.564 kg)  02/11/12 274 lb 4 oz (124.399 kg)    GEN: A/Ox3, obese wf on 02 in wheelchair   HEENT:  [place]/AT,  EACs-clear, TMs-wnl, NOSE-clear, THROAT-clear, no lesions, no postnasal drip or exudate noted.   NECK:  Supple w/ fair ROM; no JVD; normal carotid impulses w/o bruits; no thyromegaly or nodules palpated; no lymphadenopathy.  RESP   distant bs bilaterally with ,junki bilat exp rhonchi.no accessory muscle use,    CARD:  RRR, no m/r/g  ,  pulses intact, no cyanosis or clubbing, mild peripheral edema and stasis dermatitis  GI:   Soft & nt; nml bowel sounds; no organomegaly or masses detected.  Musco: Warm bil, no deformities or joint swelling noted.   Neuro: alert, no focal deficits noted.    Skin: Warm, no lesions or rashes    cxr 05/03/12  RLL atx  Assessment & Plan:

## 2012-05-25 DIAGNOSIS — R9389 Abnormal findings on diagnostic imaging of other specified body structures: Secondary | ICD-10-CM | POA: Insufficient documentation

## 2012-05-25 NOTE — Assessment & Plan Note (Addendum)
-   HC03 elevated 09/2010 so also has hypercarbic component    - Refused bibap @ Admit 09/2010 [facility]  rx = 02 2-3 lpm  24/7  titrated > adequate rx

## 2012-05-25 NOTE — Assessment & Plan Note (Signed)
Study done in [facility] not available but apparently suggests RLL atx so after rx with augmentin and prednisone needs repeat and refer back here if abn

## 2012-05-25 NOTE — Assessment & Plan Note (Signed)
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  Overall much better since in snf and not smoking

## 2012-05-26 ENCOUNTER — Ambulatory Visit: Payer: Medicare Other | Admitting: Internal Medicine

## 2012-06-15 ENCOUNTER — Encounter (HOSPITAL_COMMUNITY): Payer: Medicare Other

## 2012-07-10 ENCOUNTER — Encounter (HOSPITAL_COMMUNITY): Payer: Self-pay

## 2012-07-10 ENCOUNTER — Ambulatory Visit (HOSPITAL_COMMUNITY)
Admission: RE | Admit: 2012-07-10 | Discharge: 2012-07-10 | Disposition: A | Discharge status: Home / Self Care | Payer: Medicare Other | Source: Ambulatory Visit | Attending: Internal Medicine | Admitting: Internal Medicine

## 2012-07-10 VITALS — BP 120/54 | HR 100 | Wt 254.8 lb

## 2012-07-10 DIAGNOSIS — I509 Heart failure, unspecified: Secondary | ICD-10-CM

## 2012-07-10 DIAGNOSIS — E7800 Pure hypercholesterolemia, unspecified: Secondary | ICD-10-CM | POA: Insufficient documentation

## 2012-07-10 DIAGNOSIS — G2581 Restless legs syndrome: Secondary | ICD-10-CM | POA: Insufficient documentation

## 2012-07-10 DIAGNOSIS — E785 Hyperlipidemia, unspecified: Secondary | ICD-10-CM | POA: Insufficient documentation

## 2012-07-10 DIAGNOSIS — J9610 Chronic respiratory failure, unspecified whether with hypoxia or hypercapnia: Secondary | ICD-10-CM | POA: Insufficient documentation

## 2012-07-10 DIAGNOSIS — N189 Chronic kidney disease, unspecified: Secondary | ICD-10-CM | POA: Insufficient documentation

## 2012-07-10 DIAGNOSIS — I5022 Chronic systolic (congestive) heart failure: Secondary | ICD-10-CM | POA: Insufficient documentation

## 2012-07-10 DIAGNOSIS — I428 Other cardiomyopathies: Secondary | ICD-10-CM | POA: Insufficient documentation

## 2012-07-10 DIAGNOSIS — I129 Hypertensive chronic kidney disease with stage 1 through stage 4 chronic kidney disease, or unspecified chronic kidney disease: Secondary | ICD-10-CM | POA: Insufficient documentation

## 2012-07-10 DIAGNOSIS — Z9981 Dependence on supplemental oxygen: Secondary | ICD-10-CM | POA: Insufficient documentation

## 2012-07-10 DIAGNOSIS — Z794 Long term (current) use of insulin: Secondary | ICD-10-CM | POA: Insufficient documentation

## 2012-07-10 DIAGNOSIS — J449 Chronic obstructive pulmonary disease, unspecified: Secondary | ICD-10-CM | POA: Insufficient documentation

## 2012-07-10 DIAGNOSIS — F411 Generalized anxiety disorder: Secondary | ICD-10-CM | POA: Insufficient documentation

## 2012-07-10 DIAGNOSIS — J4489 Other specified chronic obstructive pulmonary disease: Secondary | ICD-10-CM | POA: Insufficient documentation

## 2012-07-10 DIAGNOSIS — E119 Type 2 diabetes mellitus without complications: Secondary | ICD-10-CM | POA: Insufficient documentation

## 2012-07-10 DIAGNOSIS — Z79899 Other long term (current) drug therapy: Secondary | ICD-10-CM | POA: Insufficient documentation

## 2012-07-10 MED ORDER — METOPROLOL SUCCINATE ER 25 MG PO TB24: 25.0000 mg | ORAL_TABLET | Freq: Two times a day (BID) | ORAL | Status: DC

## 2012-07-10 NOTE — Progress Notes (Signed)
Patient ID: [name], female   DOB: [birth year], 67 y.o.   MRN: [id number]   Weight Range      Baseline proBNP   1715    HPI:  67 y/o woman with multiple medical problems including morbid obesity, COPD on 4L home O2, DM2, HTN and HL.  Systolic HF secondary to NICM with EF 20-25% and CRI with baseline Cr 1.5. She has had multiple admissions for respiratory failure.   Echo 03/24/11: LVEF 30-35% with mild concentric hypertrophy.  Mild MR.  LA mildly dilated.  Myoview 03/25/11:  No evidence for inducible ischemia. Moderate inferolateral and small distal anteroseptal fixed defects are identified and may be related to scar.  Inferior and mid/distal inferolateral hypokinesia extending into the apex.  Left ventricular ejection fraction of 35%. Cath deferred as she was not candidate for CABG (due to COPD) and not having angina to warrant PCI if lesion was found. Opted for medical therapy.   Admitted for CP and progressive dyspnea and found to be in respiratory failure requiring intubation x2 10/2011.  She diuresed 25 pounds on IV lasix and nesiritide.  Repeat echo 10/16 did not show the RV well but EF was 20-25% with global hypokinesis. (RV not terrible. Probably moderately dilated and mildly HK).  Underwent LHC on 10/15 showing minimal nonobstructive CAD.  She was discharged to [facility] for ~2weeks.  10/26/11 LHC: Left main: Normal, LAD: Large vessel wrapped the apex. Diffuse 20-30% stenosis in mid to distal vessel, LCX: Normal, RCA: Large dominant vessel. Normal   She returns for follow up. Last visit Torsemide increased to 40 mg bid and Metolazone added every Tuesday and Saturday. Complains of a cough with a productive cough. CXR completed at SNF last week. She does not know about the results. Weight at the SNF has been 251-254 pounds.Remains chair bound.  Continues to eat high salt food such as soups.  Lives at [facility].   ROS: All systems negative except as listed in HPI, PMH and  Problem List.  Past Medical History  Diagnosis Date  . RENAL INSUFFICIENCY 10/15/2008  . Edema 06/18/2008  . PNEUMONIA 12/01/2007  . CARPAL TUNNEL SYNDROME, BILATERAL 10/30/2007  . HAND PAIN, LEFT 09/04/2007  . Morbid obesity   . ANXIETY 11/08/2006  . HYPERTENSION 11/08/2006  . DIABETES MELLITUS, TYPE II 11/08/2006  . HYPERCHOLESTEROLEMIA 07/24/2009  . ANEMIA 07/24/2009  . COPD 11/08/2006    -HFA 50% p coaching 10/15/2009  . RESPIRATORY FAILURE, CHRONIC 10/15/2009  . BACK PAIN, LUMBAR 03/07/2009  . CHEST PAIN 07/24/2009  . Restless leg syndrome   . Systolic CHF, acute 03/24/2011  . Ischemic cardiomyopathy 03/26/2011  . A-fib     Current Outpatient Prescriptions  Medication Sig Dispense Refill  . acetaminophen (TYLENOL) 500 MG tablet Take 1,000 mg by mouth every 6 (six) hours as needed. For pain      . albuterol (PROVENTIL HFA;VENTOLIN HFA) 108 (90 BASE) MCG/ACT inhaler Inhale 2 puffs into the lungs every 6 (six) hours as needed.      Marland Kitchen albuterol (PROVENTIL) (2.5 MG/3ML) 0.083% nebulizer solution Take 2.5 mg by nebulization every 6 (six) hours as needed. For shortness of breath      . ALPRAZolam (XANAX) 0.25 MG tablet Take 1 tablet (0.25 mg total) by mouth 2 (two) times daily as needed for sleep.  20 tablet  0  . dextromethorphan-guaiFENesin (MUCINEX DM) 30-600 MG per 12 hr tablet Take 1 tablet by mouth 2 (two) times daily as needed.      Marland Kitchen  diphenhydrAMINE (BENADRYL) 25 mg capsule Take 25 mg by mouth every 6 (six) hours as needed for itching.      . docusate sodium (COLACE) 100 MG capsule Take 100 mg by mouth 2 (two) times daily.      . DULoxetine (CYMBALTA) 30 MG capsule Take 30 mg by mouth daily.      . ferrous sulfate 325 (65 FE) MG tablet Take 325 mg by mouth daily with breakfast.      . Fluticasone-Salmeterol (ADVAIR) 250-50 MCG/DOSE AEPB Inhale 1 puff into the lungs every 12 (twelve) hours.      . insulin aspart (NOVOLOG) 100 UNIT/ML injection Inject 15 Units into the skin 3 (three)  times daily with meals.      . insulin glargine (LANTUS) 100 UNIT/ML injection Inject 62 Units into the skin at bedtime.      . metolazone (ZAROXOLYN) 2.5 MG tablet 1 tablet on Tues and Sat      . metoprolol tartrate (LOPRESSOR) 25 MG tablet Take 1 tablet (25 mg total) by mouth 2 (two) times daily.      . Multiple Vitamin (MULTIVITAMIN) tablet Take 1 tablet by mouth daily.       . polyethylene glycol (MIRALAX / GLYCOLAX) packet Take 17 g by mouth daily.      . promethazine (PHENERGAN) 25 MG tablet Take 25 mg by mouth every 6 (six) hours as needed.      . senna (SENOKOT) 8.6 MG TABS Take 2 tablets by mouth at bedtime.      Marland Kitchen spironolactone (ALDACTONE) 25 MG tablet Take 75 mg by mouth daily.       . sucralfate (CARAFATE) 1 G tablet Take 1 g by mouth 4 (four) times daily.      Marland Kitchen tiotropium (SPIRIVA) 18 MCG inhalation capsule Place 18 mcg into inhaler and inhale daily.      Marland Kitchen torsemide (DEMADEX) 20 MG tablet Take 2 tablets (40 mg total) by mouth 2 (two) times daily.  120 tablet  3  . traMADol (ULTRAM) 50 MG tablet Take 50 mg by mouth 4 (four) times daily as needed.      . [DISCONTINUED] cloNIDine (CATAPRES) 0.1 MG tablet Take 0.1 mg by mouth 3 (three) times daily.       . [DISCONTINUED] famotidine (PEPCID) 20 MG tablet Take 20 mg by mouth daily as needed. For heartburn.       No current facility-administered medications for this encounter.     PHYSICAL EXAM: Filed Vitals:   07/10/12 1413  BP: 120/54  Pulse: 100  Weight: 254 lb 12.8 oz (115.577 kg)  SpO2: 95%   Weight 274>268>254 pounds  General:  Chronically ill appearing in wheelchair. No resp difficulty HEENT: normal Neck: supple. JVP difficult to see. Carotids 2+ bilaterally; no bruits. No lymphadenopathy or thryomegaly appreciated. Cor: PMI normal. Regular rate & rhythm. No rubs, gallops or murmurs. Lungs:RML RLL Clear decreased on 2 liters [place] Abdomen: obese, soft, nontender, nondistended. No hepatosplenomegaly. No bruits or masses.  Good bowel sounds. Extremities: no cyanosis, clubbing, rash, LLE 1+ erythema RLE 1+ edema with erythema of lower extremities bilaterally. Neuro: alert & orientedx3, cranial nerves grossly intact. Moves all 4 extremities w/o difficulty. Affect pleasant.    ASSESSMENT & PLAN:

## 2012-07-10 NOTE — Assessment & Plan Note (Addendum)
NYHA IIIb. Volume status stable. Continue current diuretic regimen. Stop lopressor and start TOPROL-XL 25 mg bid as this is recommended in the HF guidelines. She is not on an ACE-I due to CKD.  Or Instructed SNF to continue to weigh and record daily and provide low salt diet. Follow up in 3 months.

## 2012-07-12 ENCOUNTER — Telehealth: Payer: Self-pay | Admitting: Internal Medicine

## 2012-07-12 DIAGNOSIS — J449 Chronic obstructive pulmonary disease, unspecified: Secondary | ICD-10-CM

## 2012-07-12 NOTE — Telephone Encounter (Signed)
Order has been placed. Pt is aware. Nothing further was needed. 

## 2012-07-12 NOTE — Telephone Encounter (Addendum)
Called spoke with patient who reported that she is returning to her daughter's home after being discharged from [facility] and needs a new order for home O2.  Pt stated she was living with her daughter previously so [facility] should have this address on file.  Did call [employer] and spoke with Shanda Bumps who verified the above > pt had home O2 thru that company before being admitted to SNF.  Will need order to restart.   Per pt's chart, she has been on this for quite some time but unable to find definitively if Pulmonary ordered this.  Dr Sherene Sires, may triage place the order?  Thank you. Per last ov w/ MW on 5.14.14 Patient Instructions    Prednisone 10 mg take 4 each am x 2 days, 2 each am x 2 days, 1 each am x2days and stop  Augmentin 875 twice daily x 10 days  You will need a cxr in 10 days and ok to proceed with surgery if clear, if not you need to return here before your procedure   RESPIRATORY FAILURE, CHRONIC - Nyoka Cowden, MD at 05/25/2012 6:34 AM     - HC03 elevated 09/2010 so also has hypercarbic component  - Refused bibap @ Admit 09/2010 [facility]  rx = 02 2-3 lpm 24/7 titrated > adequate rx

## 2012-07-12 NOTE — Telephone Encounter (Signed)
Ok to order 

## 2012-07-28 ENCOUNTER — Telehealth: Payer: Self-pay | Admitting: Internal Medicine

## 2012-07-28 NOTE — Telephone Encounter (Signed)
Mid august with all meds in hand

## 2012-07-28 NOTE — Telephone Encounter (Signed)
[email or web address].Malai Lady, CMA  

## 2012-07-28 NOTE — Telephone Encounter (Signed)
Spoke to pt. She wants to continue to see MW for her pulmonary issues. Was last seen in 05/2012 for surgical clearance.  MW - please advise when you would like for her follow up with you. Thanks.

## 2012-07-31 NOTE — Telephone Encounter (Signed)
Called and spoke with pt and she is aware of appt with MW on 8/18 at 2:15.   Pt is aware to bring in all meds but she stated that the facility will not let her bring in all of her meds so she will bring in a list from the facility.  Nothing further is needed.

## 2012-08-28 ENCOUNTER — Ambulatory Visit (INDEPENDENT_AMBULATORY_CARE_PROVIDER_SITE_OTHER): Payer: Medicare Other | Admitting: Internal Medicine

## 2012-08-28 ENCOUNTER — Encounter: Payer: Self-pay | Admitting: Internal Medicine

## 2012-08-28 ENCOUNTER — Ambulatory Visit: Payer: Medicare Other | Admitting: Internal Medicine

## 2012-08-28 VITALS — BP 130/60 | HR 98 | Temp 97.5°F | Ht 66.5 in | Wt 251.6 lb

## 2012-08-28 DIAGNOSIS — R9389 Abnormal findings on diagnostic imaging of other specified body structures: Secondary | ICD-10-CM

## 2012-08-28 DIAGNOSIS — R918 Other nonspecific abnormal finding of lung field: Secondary | ICD-10-CM

## 2012-08-28 DIAGNOSIS — J9610 Chronic respiratory failure, unspecified whether with hypoxia or hypercapnia: Secondary | ICD-10-CM

## 2012-08-28 DIAGNOSIS — J449 Chronic obstructive pulmonary disease, unspecified: Secondary | ICD-10-CM

## 2012-08-28 MED ORDER — AMOXICILLIN-POT CLAVULANATE 875-125 MG PO TABS: 1.0000 | ORAL_TABLET | Freq: Two times a day (BID) | ORAL | Status: DC

## 2012-08-28 NOTE — Progress Notes (Signed)
Subjective:    Patient ID: [name], female    DOB: [birth year]    MRN: [id number]  Brief patient profile:  55 yowf with morbid obesity/ aodm variably quits smoking  with known hx of COPD- O2 dependent     HPI 10/30/2010 f/u ov/Wert off cigarettes and off predisone on 02 4lpm 24 hours cc doe x 50 ft,  No cough, not using neb but has proair twice daily with doe x 25 ft. Only new c/o is increased leg swelling and has f/u with renal scheduled w/in the next week rec Work on inhaler technique   Double furosemide to where you take it 2 each am until swelling better, then one daily thereafter  Please schedule a follow up office visit in 4 weeks, sooner if needed pfts  07/06/2011 no show, never had pfts     08/28/2012 f/u ov/Wert 02 desp resp failure/ copd /obesity Chief Complaint  Patient presents with  . Follow-up    pt reports some diff breathing, chest congestion, green nasal drainage, some chills x 2 weeks    doe x across the room even on 02 so uses bsc  No obvious daytime variabilty or assoc  cp or chest tightness, subjective wheeze overt sinus or hb symptoms. No unusual exp hx or h/o childhood pna/ asthma or premature birth to her knowledge.   Sleeping ok on side and on 02 without nocturnal  or early am exacerbation  of respiratory  C/o's or HA/ drowsiness or need for noct saba. Also denies any obvious fluctuation of symptoms with weather or environmental changes or other aggravating or alleviating factors except as outlined above   Current Medications, Allergies, Past Medical History, Past Surgical History, Family History, and Social History were reviewed in [employer] record.  ROS  The following are not active complaints unless bolded sore throat, dysphagia, dental problems, itching, sneezing,  nasal congestion or excess/ purulent secretions, ear ache,   fever, chills, sweats, unintended wt loss, pleuritic or exertional cp, hemoptysis,  orthopnea pnd or leg  swelling, presyncope, palpitations, heartburn, abdominal pain, anorexia, nausea, vomiting, diarrhea  or change in bowel or urinary habits, change in stools or urine, dysuria,hematuria,  rash, arthralgias, visual complaints, headache, numbness weakness or ataxia or problems with walking or coordination,  change in mood/affect or memory.            Objective:   Physical Exam  08/28/2012        251  Wt Readings from Last 3 Encounters:  05/24/12 253 lb (114.76 kg)  03/07/12 268 lb (121.564 kg)  02/11/12 274 lb 4 oz (124.399 kg)    GEN: A/Ox3, obese wf on 02 in wheelchair/ congested cough    HEENT:  [place]/AT,  EACs-clear, TMs-wnl, NOSE-clear, THROAT-clear, no lesions, no postnasal drip or exudate noted.   NECK:  Supple w/ fair ROM; no JVD; normal carotid impulses w/o bruits; no thyromegaly or nodules palpated; no lymphadenopathy.  RESP   distant bs bilaterally with ,junki bilat exp rhonchi esp on R, no accessory muscle use,    CARD:  RRR, no m/r/g  , pulses intact, no cyanosis or clubbing, mild peripheral edema and stasis dermatitis  GI:   Soft & nt; nml bowel sounds; no organomegaly or masses detected.  Musco: Warm bil, no deformities or joint swelling noted.   Neuro: alert, no focal deficits noted.    Skin: Warm, no lesions or rashes    cxr 08/2012 orderd by Nickola Major  NP at  [facility]        Assessment & Plan:

## 2012-08-28 NOTE — Patient Instructions (Addendum)
Work on inhaler technique:  relax and gently blow all the way out then take a nice smooth deep breath back in, triggering the inhaler at same time you start breathing in.  Hold for up to 5 seconds if you can.  Rinse and gargle with water when done  augmentin 875 twice daily x 10 days  Please schedule a follow up office visit in 6 weeks, call sooner if needed with pfts and cxr on return

## 2012-08-29 NOTE — Assessment & Plan Note (Addendum)
-   last hc03 28 01/2012 but suspect has an element of OHS also contributing    - Refused bibap @ Admit 09/2010 [facility]  rx = 02 2 lpm 24/7  Adequate control on present rx, reviewed > no change in rx needed

## 2012-08-29 NOTE — Assessment & Plan Note (Signed)
DDX of  difficult airways managment all start with A and  include Adherence, Ace Inhibitors, Acid Reflux, Active Sinus Disease, Alpha 1 Antitripsin deficiency, Anxiety masquerading as Airways dz,  ABPA,  allergy(esp in young), Aspiration (esp in elderly), Adverse effects of DPI,  Active smokers, plus two Bs  = Bronchiectasis and Beta blocker use..and one C= CHF   Adherence is always the initial "prime suspect" and is a multilayered concern that requires a "trust but verify" approach in every patient -she seems to be doing better in snf and strongly encouraged her to remain there so she stays adherent to meds/ diet  The proper method of use, as well as anticipated side effects, of a metered-dose inhaler are discussed and demonstrated to the patient. Improved effectiveness after extensive coaching during this visit to a level of approximately  75% from a baseline of 25% so need to keep training her on this  ? Sinusitis ? Bronchiectasis RLL > rx augmentin and pred x 6 and recheck cxr and pft's in 4-6 weeks

## 2012-08-30 NOTE — Assessment & Plan Note (Signed)
Rhonchi more prominent on R so need to be sure to do f/u cxr next ov but not a candidate at this point for any from of pulmonary intervention

## 2012-09-07 ENCOUNTER — Telehealth: Payer: Self-pay | Admitting: Internal Medicine

## 2012-09-07 NOTE — Telephone Encounter (Signed)
lmomtcb x1--i don't see where anyone has called pt

## 2012-09-08 NOTE — Telephone Encounter (Signed)
Pt aware we did not call. Jennifer Castillo, CMA  

## 2012-10-11 ENCOUNTER — Ambulatory Visit (INDEPENDENT_AMBULATORY_CARE_PROVIDER_SITE_OTHER): Payer: Medicare Other | Admitting: Internal Medicine

## 2012-10-11 ENCOUNTER — Ambulatory Visit (INDEPENDENT_AMBULATORY_CARE_PROVIDER_SITE_OTHER)
Admission: RE | Admit: 2012-10-11 | Discharge: 2012-10-11 | Disposition: A | Discharge status: Home / Self Care | Payer: Medicare Other | Source: Ambulatory Visit | Attending: Internal Medicine | Admitting: Internal Medicine

## 2012-10-11 ENCOUNTER — Encounter: Payer: Self-pay | Admitting: Internal Medicine

## 2012-10-11 VITALS — BP 126/84 | HR 100 | Temp 97.9°F | Ht 66.5 in | Wt 252.0 lb

## 2012-10-11 DIAGNOSIS — J449 Chronic obstructive pulmonary disease, unspecified: Secondary | ICD-10-CM

## 2012-10-11 DIAGNOSIS — R918 Other nonspecific abnormal finding of lung field: Secondary | ICD-10-CM

## 2012-10-11 DIAGNOSIS — R9389 Abnormal findings on diagnostic imaging of other specified body structures: Secondary | ICD-10-CM

## 2012-10-11 DIAGNOSIS — J9610 Chronic respiratory failure, unspecified whether with hypoxia or hypercapnia: Secondary | ICD-10-CM

## 2012-10-11 LAB — PULMONARY FUNCTION TEST

## 2012-10-11 NOTE — Progress Notes (Signed)
PFT done today. 

## 2012-10-11 NOTE — Patient Instructions (Addendum)
Plan A = automatic symbicort and spiriva  Plan B = backup, as needed first try the albuterol hfa then the nebulizer if that doesn't work  For cough mucinex dm 1200 mg every 12 hours and supplement with tramadol 50 mg in 4 hours if needed   Please remember to go to the  x-ray department downstairs for your tests - we will call you with the results when they are available.     Please schedule a follow up office visit in 6 weeks, call sooner if needed

## 2012-10-11 NOTE — Assessment & Plan Note (Signed)
cxr shows chronic changes in RML and lingula >  Based on severe lung dz there's really nothing needed here.

## 2012-10-11 NOTE — Assessment & Plan Note (Signed)
-   last hc03 28 01/2012 but suspect has an element of OHS also contributing    - Refused bibap @ Admit 09/2010 [facility]  rx = 02 2 lpm 24/7

## 2012-10-11 NOTE — Progress Notes (Signed)
Subjective:    Patient ID: [name], female    DOB: [birth year]    MRN: [id number]  Brief patient profile:  10 yowf with morbid obesity/ aodm? Quit smoking 01/2008 with known hx of COPD- O2 dependent     HPI 10/30/2010 f/u ov/[name] off cigarettes and off predisone on 02 4lpm 24 hours cc doe x 50 ft,  No cough, not using neb but has proair twice daily with doe x 25 ft. Only new c/o is increased leg swelling and has f/u with renal scheduled w/in the next week rec Work on inhaler technique   Double furosemide to where you take it 2 each am until swelling better, then one daily thereafter  Please schedule a follow up office visit in 4 weeks, sooner if needed pfts  07/06/2011 no show, never had pfts     08/28/2012 f/u ov/[name] 02 desp resp failure/ copd /obesity Chief Complaint  Patient presents with  . Follow-up    pt reports some diff breathing, chest congestion, green nasal drainage, some chills x 2 weeks    doe x across the room even on 02 so uses bsc rec Work on inhaler technique:  relax and gently blow all the way out then take a nice smooth deep breath back in, triggering the inhaler at same time you start breathing in.  Hold for up to 5 seconds if you can.  Rinse and gargle with water when done augmentin 875 twice daily x 10 days    10/11/2012 f/u ov/[name] re: COPD GOLD III 02 dep Chief Complaint  Patient presents with  . COPD    Breathing is unchanged. Reports SOB, coughing, chest tightness and wheezing. PFT done today.  on symbicort and spiriva plus prn neb one per day, sob walking to bathroom even on 02 24/7, hfa still not adequate technique, mucus stays discolored until aroundlunch daily  No obvious daytime variabilty or assoc  cp or chest tightness, subjective wheeze overt sinus or hb symptoms. No unusual exp hx or h/o childhood pna/ asthma or premature birth to her knowledge.   Sleeping ok on side and on 02 without nocturnal  or early am exacerbation  of respiratory   C/o's or HA/ drowsiness or need for noct saba. Also denies any obvious fluctuation of symptoms with weather or environmental changes or other aggravating or alleviating factors except as outlined above   Current Medications, Allergies, Past Medical History, Past Surgical History, Family History, and Social History were reviewed in [employer] record.  ROS  The following are not active complaints unless bolded sore throat, dysphagia, dental problems, itching, sneezing,  nasal congestion or excess/ purulent secretions, ear ache,   fever, chills, sweats, unintended wt loss, pleuritic or exertional cp, hemoptysis,  orthopnea pnd or leg swelling, presyncope, palpitations, heartburn, abdominal pain, anorexia, nausea, vomiting, diarrhea  or change in bowel or urinary habits, change in stools or urine, dysuria,hematuria,  rash, arthralgias, visual complaints, headache, numbness weakness or ataxia or problems with walking or coordination,  change in mood/affect or memory.            Objective:   Physical Exam  10/11/2012        252  08/28/2012        251  Wt Readings from Last 3 Encounters:  05/24/12 253 lb (114.76 kg)  03/07/12 268 lb (121.564 kg)  02/11/12 274 lb 4 oz (124.399 kg)    GEN: A/Ox3, obese wf on 02 in wheelchair/ congested cough  HEENT:  [place]/AT,  EACs-clear, TMs-wnl, NOSE-clear, THROAT-clear, no lesions, no postnasal drip or exudate noted.   NECK:  Supple w/ fair ROM; no JVD; normal carotid impulses w/o bruits; no thyromegaly or nodules palpated; no lymphadenopathy.  RESP   distant bs bilaterally with ,junky bilat exp rhonchi bilaterally,no accessory muscle use,    CARD:  RRR, no m/r/g  , pulses intact, no cyanosis or clubbing, mild peripheral edema and stasis dermatitis  GI:   Soft & nt; nml bowel sounds; no organomegaly or masses detected.  Musco: Warm bil, no deformities or joint swelling noted.   Neuro: alert, no focal deficits noted.    Skin:  Warm, no lesions or rashes    CXR  10/11/2012 : Heart size and vascularity are normal. Right middle lobe and  lingular density on the lateral view. This was present on the prior  CT of 01/19/2012 and may be chronic atelectasis or recurrent  pneumonia. No other infiltrates. Negative for effusion.  Underlying COPD.         Assessment & Plan:

## 2012-10-11 NOTE — Assessment & Plan Note (Addendum)
-   PFT's  10/11/2012  FEV1 1.03 (39%) ratio 56  And no better p B2,  DLCO 45 corrects to 69   DDX of  difficult airways managment all start with A and  include Adherence, Ace Inhibitors, Acid Reflux, Active Sinus Disease, Alpha 1 Antitripsin deficiency, Anxiety masquerading as Airways dz,  ABPA,  allergy(esp in young), Aspiration (esp in elderly), Adverse effects of DPI,  Active smokers, plus two Bs  = Bronchiectasis and Beta blocker use..and one C= CHF Adherence is always the initial "prime suspect" and is a multilayered concern that requires a "trust but verify" approach in every patient - starting with knowing how to use medications, especially inhalers, correctly, keeping up with refills and understanding the fundamental difference between maintenance and prns vs those medications only taken for a very short course and then stopped and not refilled. The proper method of use, as well as anticipated side effects, of a metered-dose inhaler are discussed and demonstrated to the patient. Improved effectiveness after extensive coaching during this visit to a level of approximately  90% so continue symbicort and spiriva  ? Active sinus dz > consider sinus CT next    ? Acid (or non-acid) GERD > always difficult to exclude as up to 75% of pts in some series report no assoc GI/ Heartburn symptoms> next consider  max (24h)  acid suppression  With ppi qam ac and pepcid 20 mg qhs

## 2012-10-12 NOTE — Progress Notes (Signed)
Quick Note:  Advised pt of cxr result per MW. Pt verbalized understanding and has no further concerns or questions at this time Will f/u as needed ______

## 2012-10-19 ENCOUNTER — Ambulatory Visit (HOSPITAL_COMMUNITY): Payer: Medicare Other

## 2012-11-01 ENCOUNTER — Telehealth: Payer: Self-pay | Admitting: Internal Medicine

## 2012-11-01 NOTE — Telephone Encounter (Signed)
According to result note [name] was made aware of results on 10/12/12   I spoke with [name]. She reports she does not remember talking with [place]. I made her aware again. She voiced her understanding and needed nothing further

## 2012-11-13 ENCOUNTER — Ambulatory Visit (HOSPITAL_COMMUNITY)
Admission: RE | Admit: 2012-11-13 | Discharge: 2012-11-13 | Disposition: A | Discharge status: Home / Self Care | Payer: Medicare Other | Source: Ambulatory Visit | Attending: Cardiology | Admitting: Cardiology

## 2012-11-13 VITALS — BP 152/74 | HR 93 | Wt 250.5 lb

## 2012-11-13 DIAGNOSIS — N19 Unspecified kidney failure: Secondary | ICD-10-CM

## 2012-11-13 DIAGNOSIS — I5022 Chronic systolic (congestive) heart failure: Secondary | ICD-10-CM | POA: Insufficient documentation

## 2012-11-13 DIAGNOSIS — I509 Heart failure, unspecified: Secondary | ICD-10-CM | POA: Insufficient documentation

## 2012-11-13 DIAGNOSIS — L0291 Cutaneous abscess, unspecified: Secondary | ICD-10-CM

## 2012-11-13 DIAGNOSIS — L0390 Cellulitis, unspecified: Secondary | ICD-10-CM

## 2012-11-13 NOTE — Progress Notes (Signed)
Patient ID: [name], female   DOB: 09-Apr-1945, 68 y.o.   MRN: [id number] Pulmonologist: Dr. Sherene Sires  HPI:  67 y/o woman with multiple medical problems including morbid obesity, COPD on 4L home O2, DM2, HTN and HL.  Systolic HF secondary to NICM with EF 20-25% and CRI with baseline Cr 1.5. She has had multiple admissions for respiratory failure.   Echo 03/24/11: LVEF 30-35% with mild concentric hypertrophy.  Mild MR.  LA mildly dilated.  Myoview 03/25/11:  No evidence for inducible ischemia. Moderate inferolateral and small distal anteroseptal fixed defects are identified and may be related to scar.  Inferior and mid/distal inferolateral hypokinesia extending into the apex.  Left ventricular ejection fraction of 35%. Cath deferred as she was not candidate for CABG (due to COPD) and not having angina to warrant PCI if lesion was found. Opted for medical therapy.   Admitted for CP and progressive dyspnea and found to be in respiratory failure requiring intubation x2 10/2011.  She diuresed 25 pounds on IV lasix and nesiritide.  Repeat echo 10/2011 did not show the RV well but EF was 20-25% with global hypokinesis. (RV not terrible. Probably moderately dilated and mildly HK).  Underwent LHC on 10/26/2011 showing minimal nonobstructive CAD.  She was discharged to [facility] for ~2weeks.  10/26/11 LHC: Left main: Normal, LAD: Large vessel wrapped the apex. Diffuse 20-30% stenosis in mid to distal vessel, LCX: Normal, RCA: Large dominant vessel. Normal  Follow up: Last visit stopped lopressor and started Toprol XL 25 mg BID. At [facility]. Feeling pretty good. Weight 250-255 lbs. Trying to follow a low salt diet and drink less than 2L a day. Can walk about 10 ft. Denies orthopnea or CP. +DOE and sometimes SOB with sitting.   ROS: All systems negative except as listed in HPI, PMH and Problem List.  Past Medical History  Diagnosis Date  . RENAL INSUFFICIENCY 10/15/2008  . Edema 06/18/2008  .  PNEUMONIA 12/01/2007  . CARPAL TUNNEL SYNDROME, BILATERAL 10/30/2007  . HAND PAIN, LEFT 09/04/2007  . Morbid obesity   . ANXIETY 11/08/2006  . HYPERTENSION 11/08/2006  . DIABETES MELLITUS, TYPE II 11/08/2006  . HYPERCHOLESTEROLEMIA 07/24/2009  . ANEMIA 07/24/2009  . COPD 11/08/2006    -HFA 50% p coaching 10/15/2009  . RESPIRATORY FAILURE, CHRONIC 10/15/2009  . BACK PAIN, LUMBAR 03/07/2009  . CHEST PAIN 07/24/2009  . Restless leg syndrome   . Systolic CHF, acute 03/24/2011  . Ischemic cardiomyopathy 03/26/2011  . A-fib     Current Outpatient Prescriptions  Medication Sig Dispense Refill  . acetaminophen (TYLENOL) 500 MG tablet Take 1,000 mg by mouth every 6 (six) hours as needed. For pain      . albuterol (PROVENTIL HFA;VENTOLIN HFA) 108 (90 BASE) MCG/ACT inhaler Inhale 2 puffs into the lungs every 6 (six) hours as needed.      Marland Kitchen albuterol (PROVENTIL) (2.5 MG/3ML) 0.083% nebulizer solution Take 2.5 mg by nebulization every 6 (six) hours as needed. For shortness of breath      . ALPRAZolam (XANAX) 0.25 MG tablet Take 1 tablet (0.25 mg total) by mouth 2 (two) times daily as needed for sleep.  20 tablet  0  . budesonide-formoterol (SYMBICORT) 160-4.5 MCG/ACT inhaler Inhale 2 puffs into the lungs 2 (two) times daily.      Marland Kitchen dextromethorphan-guaiFENesin (MUCINEX DM) 30-600 MG per 12 hr tablet Take 1 tablet by mouth 2 (two) times daily as needed.      . diphenhydrAMINE (BENADRYL) 25  mg capsule Take 25 mg by mouth every 6 (six) hours as needed for itching.      . docusate sodium (COLACE) 100 MG capsule Take 100 mg by mouth 2 (two) times daily.      . DULoxetine (CYMBALTA) 30 MG capsule Take 30 mg by mouth daily.      . ferrous sulfate 325 (65 FE) MG tablet Take 325 mg by mouth daily with breakfast.      . insulin aspart (NOVOLOG) 100 UNIT/ML injection Inject 15 Units into the skin 3 (three) times daily with meals.      . insulin glargine (LANTUS) 100 UNIT/ML injection Inject 62 Units into the skin  at bedtime.      . metolazone (ZAROXOLYN) 2.5 MG tablet 1 tablet on Tues and Sat      . metoprolol succinate (TOPROL XL) 25 MG 24 hr tablet Take 1 tablet (25 mg total) by mouth 2 (two) times daily.  60 tablet  9  . Multiple Vitamin (MULTIVITAMIN) tablet Take 1 tablet by mouth daily.       . polyethylene glycol (MIRALAX / GLYCOLAX) packet Take 17 g by mouth daily.      . promethazine (PHENERGAN) 25 MG tablet Take 25 mg by mouth every 6 (six) hours as needed.      . senna (SENOKOT) 8.6 MG TABS Take 2 tablets by mouth at bedtime.      Marland Kitchen spironolactone (ALDACTONE) 25 MG tablet Take 75 mg by mouth daily.       . sucralfate (CARAFATE) 1 G tablet Take 1 g by mouth 4 (four) times daily.      Marland Kitchen tiotropium (SPIRIVA) 18 MCG inhalation capsule Place 18 mcg into inhaler and inhale daily.      Marland Kitchen torsemide (DEMADEX) 20 MG tablet Take 2 tablets (40 mg total) by mouth 2 (two) times daily.  120 tablet  3  . traMADol (ULTRAM) 50 MG tablet Take 50 mg by mouth 4 (four) times daily as needed.      . [DISCONTINUED] cloNIDine (CATAPRES) 0.1 MG tablet Take 0.1 mg by mouth 3 (three) times daily.       . [DISCONTINUED] famotidine (PEPCID) 20 MG tablet Take 20 mg by mouth daily as needed. For heartburn.       No current facility-administered medications for this encounter.    Filed Vitals:   11/13/12 1341  BP: 152/74  Pulse: 93  Weight: 250 lb 8 oz (113.626 kg)  SpO2: 91%  Last weight: 254 lbs   PHYSICAL EXAM: General:  Chronically ill appearing in wheelchair. No resp difficulty; on chronic 4L O2 HEENT: normal Neck: supple. JVP difficult to see. Carotids 2+ bilaterally; no bruits. No lymphadenopathy or thryomegaly appreciated. Cor: PMI normal. Regular rate & rhythm. No rubs, gallops or murmurs. Lungs:RML RLL Clear decreased on 4 liters [place] Abdomen: obese, soft, nontender, nondistended. No hepatosplenomegaly. No bruits or masses. Good bowel sounds. Extremities: no cyanosis, clubbing, rash, bilateral 1+ edema;  bilateral LE rash (erythma and open areas weeping)  Neuro: alert & orientedx3, cranial nerves grossly intact. Moves all 4 extremities w/o difficulty. Affect pleasant.  ASSESSMENT & PLAN:  1) Chronic systolic heart failure, ICM, EF 20-25% (10/2011) - Currently NYHA III symptoms. Volume status appears at baseline, will continue torsemide 40 mg BID and metolazone 2.5 mg on Tuesdays and Saturdays.  - SBP elevated will add norvasc 5 mg daily. - EF remains less than 35%, will get ECHO next visit and consider whether patient meets criteria  for ICD.  - Reinforced the need and importance of daily weights, a low sodium diet, and fluid restriction (less than 2 L a day). Instructed to call the HF clinic if weight increases more than 3 lbs overnight or 5 lbs in a week.   2) Bilateral Cellulitis - Patient appears to have bilateral LE cellulitis. She has been treated in the past with doxy. Patient currently resides at [facility], have asked that their MD assess patient's legs.   3) CRI - Have asked for SNF to draw BMET and fax results to clinic.  Follow up in 3 months with ECHO  Ulla Potash B NP-C 8:07 PM   Addendum: Medications were updated from SNF and not exactly sure why patient is on Spiro 75 mg daily. Have asked them to cut spiro back to 25 mg daily. Patient also is on Toprol 25 mg BID, will ask facility to switch to to bisoprolol 10 mg daily d/t COPD and more cardioselective. As above have asked for BMET results to be faxed to clinic. She has not been on ACE-I d/t CRI.

## 2012-11-21 NOTE — Addendum Note (Signed)
Encounter addended by: Aundria Rud, NP on: 11/21/2012 10:56 AM<BR>     Documentation filed: Follow-up Section, LOS Section

## 2012-11-22 ENCOUNTER — Encounter: Payer: Self-pay | Admitting: Internal Medicine

## 2012-11-22 ENCOUNTER — Ambulatory Visit (INDEPENDENT_AMBULATORY_CARE_PROVIDER_SITE_OTHER): Payer: Medicare Other | Admitting: Internal Medicine

## 2012-11-22 VITALS — BP 134/86 | HR 114 | Temp 98.7°F | Ht 67.0 in | Wt 233.8 lb

## 2012-11-22 DIAGNOSIS — J9610 Chronic respiratory failure, unspecified whether with hypoxia or hypercapnia: Secondary | ICD-10-CM

## 2012-11-22 DIAGNOSIS — J449 Chronic obstructive pulmonary disease, unspecified: Secondary | ICD-10-CM

## 2012-11-22 NOTE — Assessment & Plan Note (Signed)
-   Refused bibap @ Admit 09/2010 [facility]  rx = 02 2 lpm 24/7  Adequate control on present rx, reviewed > no change in rx needed

## 2012-11-22 NOTE — Assessment & Plan Note (Signed)
-   HFA 90% p extensive coaching 10/11/2012   - PFT's  10/11/2012  FEV1 1.03 (39%) ratio 56  And no better p B2,  DLCO 45 corrects to 69   Adequate control on present rx, reviewed > no change in rx needed      Each maintenance medication was reviewed in detail including most importantly the difference between maintenance and as needed and under what circumstances the prns are to be used.  Please see instructions for details which were reviewed in writing and the patient given a copy.

## 2012-11-22 NOTE — Patient Instructions (Signed)
No changes needed  Please schedule a follow up visit in 3 months but call sooner if needed

## 2012-11-22 NOTE — Progress Notes (Signed)
Subjective:    Patient ID: [name], female    DOB: [birth year]    MRN: [id number]    Brief patient profile:  57 yowf with morbid obesity/ aodm? Quit smoking 01/2008 with known hx of COPD- O2 dependent     HPI 10/30/2010 f/u ov/Thayer Inabinet off cigarettes and off predisone on 02 4lpm 24 hours cc doe x 50 ft,  No cough, not using neb but has proair twice daily with doe x 25 ft. Only new c/o is increased leg swelling and has f/u with renal scheduled w/in the next week rec Work on inhaler technique   Double furosemide to where you take it 2 each am until swelling better, then one daily thereafter  Please schedule a follow up office visit in 4 weeks, sooner if needed pfts  07/06/2011 no show, never had pfts     08/28/2012 f/u ov/Dorothy Landgrebe 02 desp resp failure/ copd /obesity Chief Complaint  Patient presents with  . Follow-up    pt reports some diff breathing, chest congestion, green nasal drainage, some chills x 2 weeks    doe x across the room even on 02 so uses bsc rec Work on inhaler technique:  relax and gently blow all the way out then take a nice smooth deep breath back in, triggering the inhaler at same time you start breathing in.  Hold for up to 5 seconds if you can.  Rinse and gargle with water when done augmentin 875 twice daily x 10 days    10/11/2012 f/u ov/Jaena Brocato re: COPD GOLD III 02 dep Chief Complaint  Patient presents with  . COPD    Breathing is unchanged. Reports SOB, coughing, chest tightness and wheezing. PFT done today.  on symbicort and spiriva plus prn neb one per day, sob walking to bathroom even on 02 24/7, hfa still not adequate technique, mucus stays discolored until aroundlunch daily rec Plan A through C reviewed   11/22/2012 f/u ov/Ludie Hudon re: copd/ 02 dep  Chief Complaint  Patient presents with  . Follow-up    Breathing is unchanged since last OV.  Still having cough in a.m with green mucus  Not needing any rescue at all in any form 2lpm 24/7  Sputum only slt  discolored am only and rest of day no color to it.  Has flutter, helps some   No obvious daytime variabilty or assoc  cp or chest tightness, subjective wheeze overt sinus or hb symptoms. No unusual exp hx or h/o childhood pna/ asthma or premature birth to her knowledge.   Sleeping ok on side and on 02 without nocturnal  or early am exacerbation  of respiratory  C/o's or HA/ drowsiness or need for noct saba. Also denies any obvious fluctuation of symptoms with weather or environmental changes or other aggravating or alleviating factors except as outlined above   Current Medications, Allergies, Past Medical History, Past Surgical History, Family History, and Social History were reviewed in [employer] record.  ROS  The following are not active complaints unless bolded sore throat, dysphagia, dental problems, itching, sneezing,  nasal congestion or excess/ purulent secretions, ear ache,   fever, chills, sweats, unintended wt loss, pleuritic or exertional cp, hemoptysis,  orthopnea pnd or leg swelling, presyncope, palpitations, heartburn, abdominal pain, anorexia, nausea, vomiting, diarrhea  or change in bowel or urinary habits, change in stools or urine, dysuria,hematuria,  rash, arthralgias, visual complaints, headache, numbness weakness or ataxia or problems with walking or coordination,  change in mood/affect or  memory.            Objective:   Physical Exam  11/22/2012      233  10/11/2012        252  08/28/2012        251  Wt Readings from Last 3 Encounters:  05/24/12 253 lb (114.76 kg)  03/07/12 268 lb (121.564 kg)  02/11/12 274 lb 4 oz (124.399 kg)    GEN: A/Ox3, obese wf on 02 in wheelchair/ somewhat congested cough    HEENT:  [place]/AT,  EACs-clear, TMs-wnl, NOSE-clear, THROAT-clear, no lesions, no postnasal drip or exudate noted.   NECK:  Supple w/ fair ROM; no JVD; normal carotid impulses w/o bruits; no thyromegaly or nodules palpated; no  lymphadenopathy.  RESP   distant bs bilaterally with min  bilat exp rhonchi bilaterally,no accessory muscle use,    CARD:  RRR, no m/r/g  , pulses intact, no cyanosis or clubbing, mild peripheral edema and stasis dermatitis  GI:   Soft & nt; nml bowel sounds; no organomegaly or masses detected.  Musco: Warm bil, no deformities or joint swelling noted.   Neuro: alert, no focal deficits noted.    Skin: Warm, no lesions or rashes    CXR  10/11/2012 : Heart size and vascularity are normal. Right middle lobe and  lingular density on the lateral view. This was present on the prior  CT of 01/19/2012 and may be chronic atelectasis or recurrent  pneumonia. No other infiltrates. Negative for effusion.  Underlying COPD.         Assessment & Plan:

## 2013-03-07 IMAGING — CR DG CHEST 1V PORT
2 series · 2 of 2 positions shown · non-contrast
Comparison: 10/27/2011; 10/26/2011

CLINICAL DATA: Pulmonary edema, endotracheal tube

PORTABLE CHEST - 1 VIEW

[AP (1 of 2)]
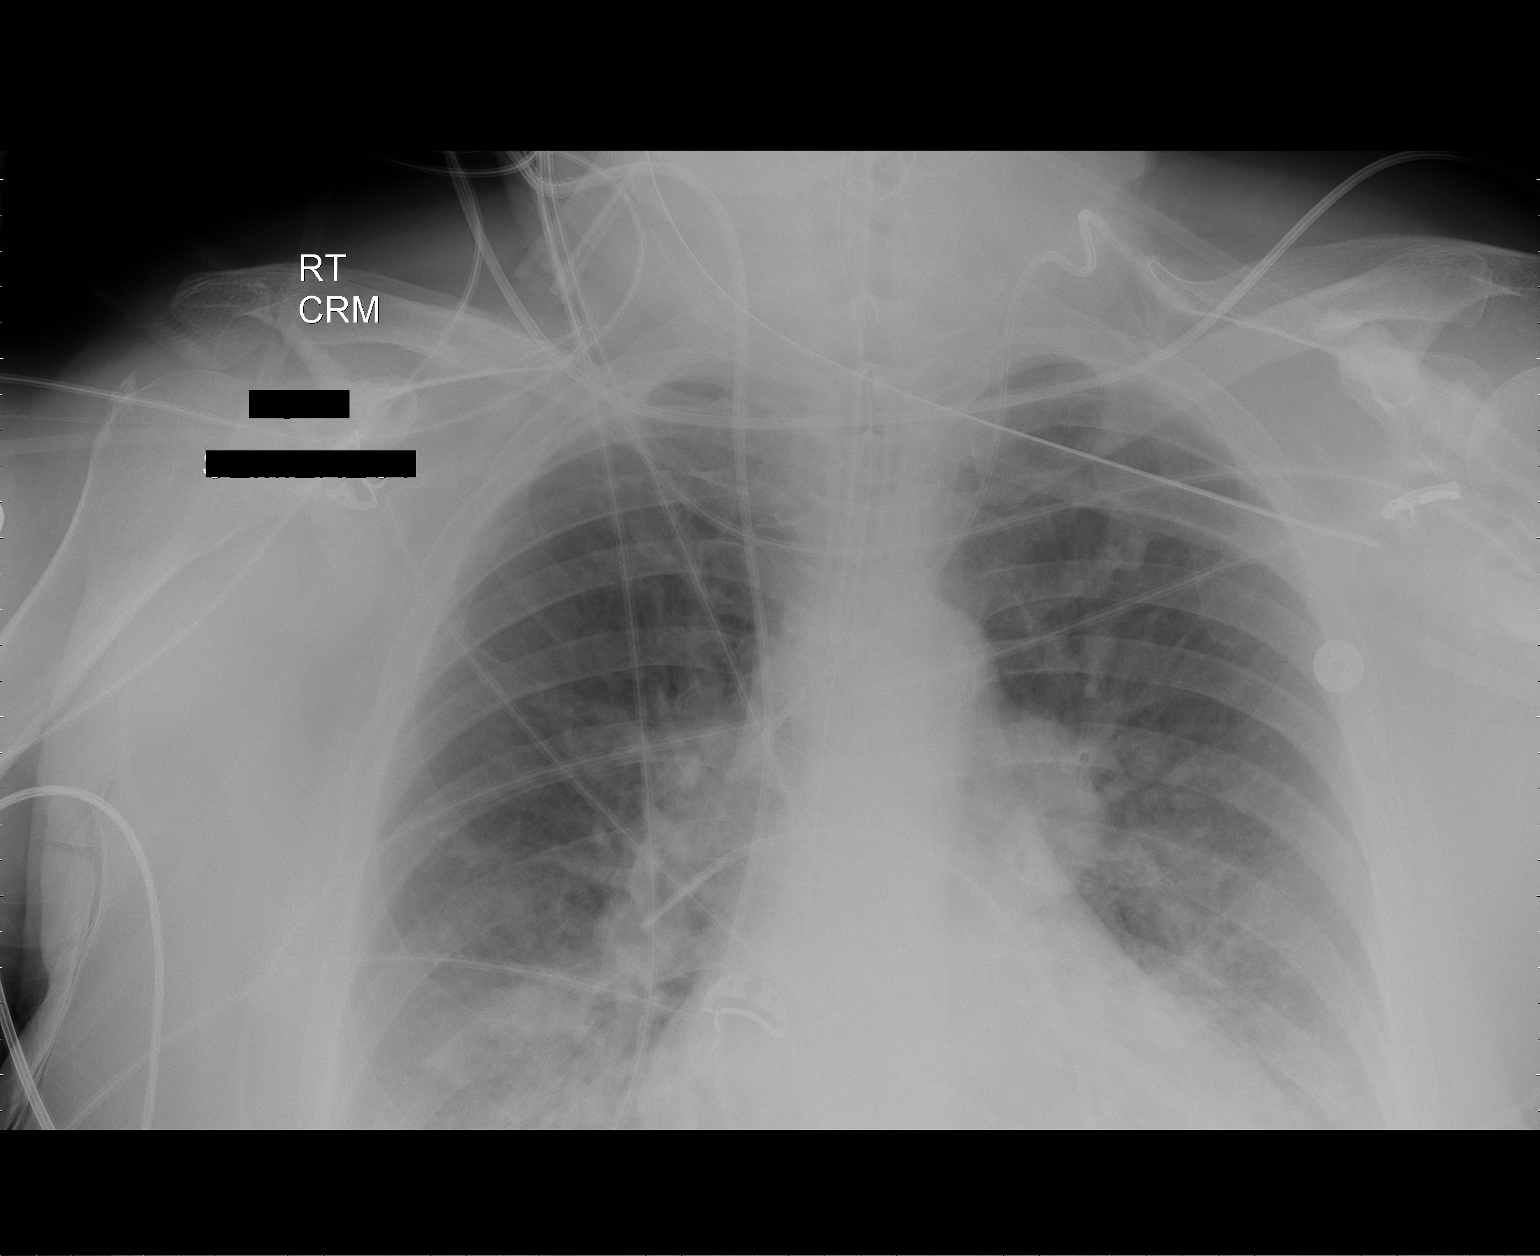

[AP (2 of 2)]
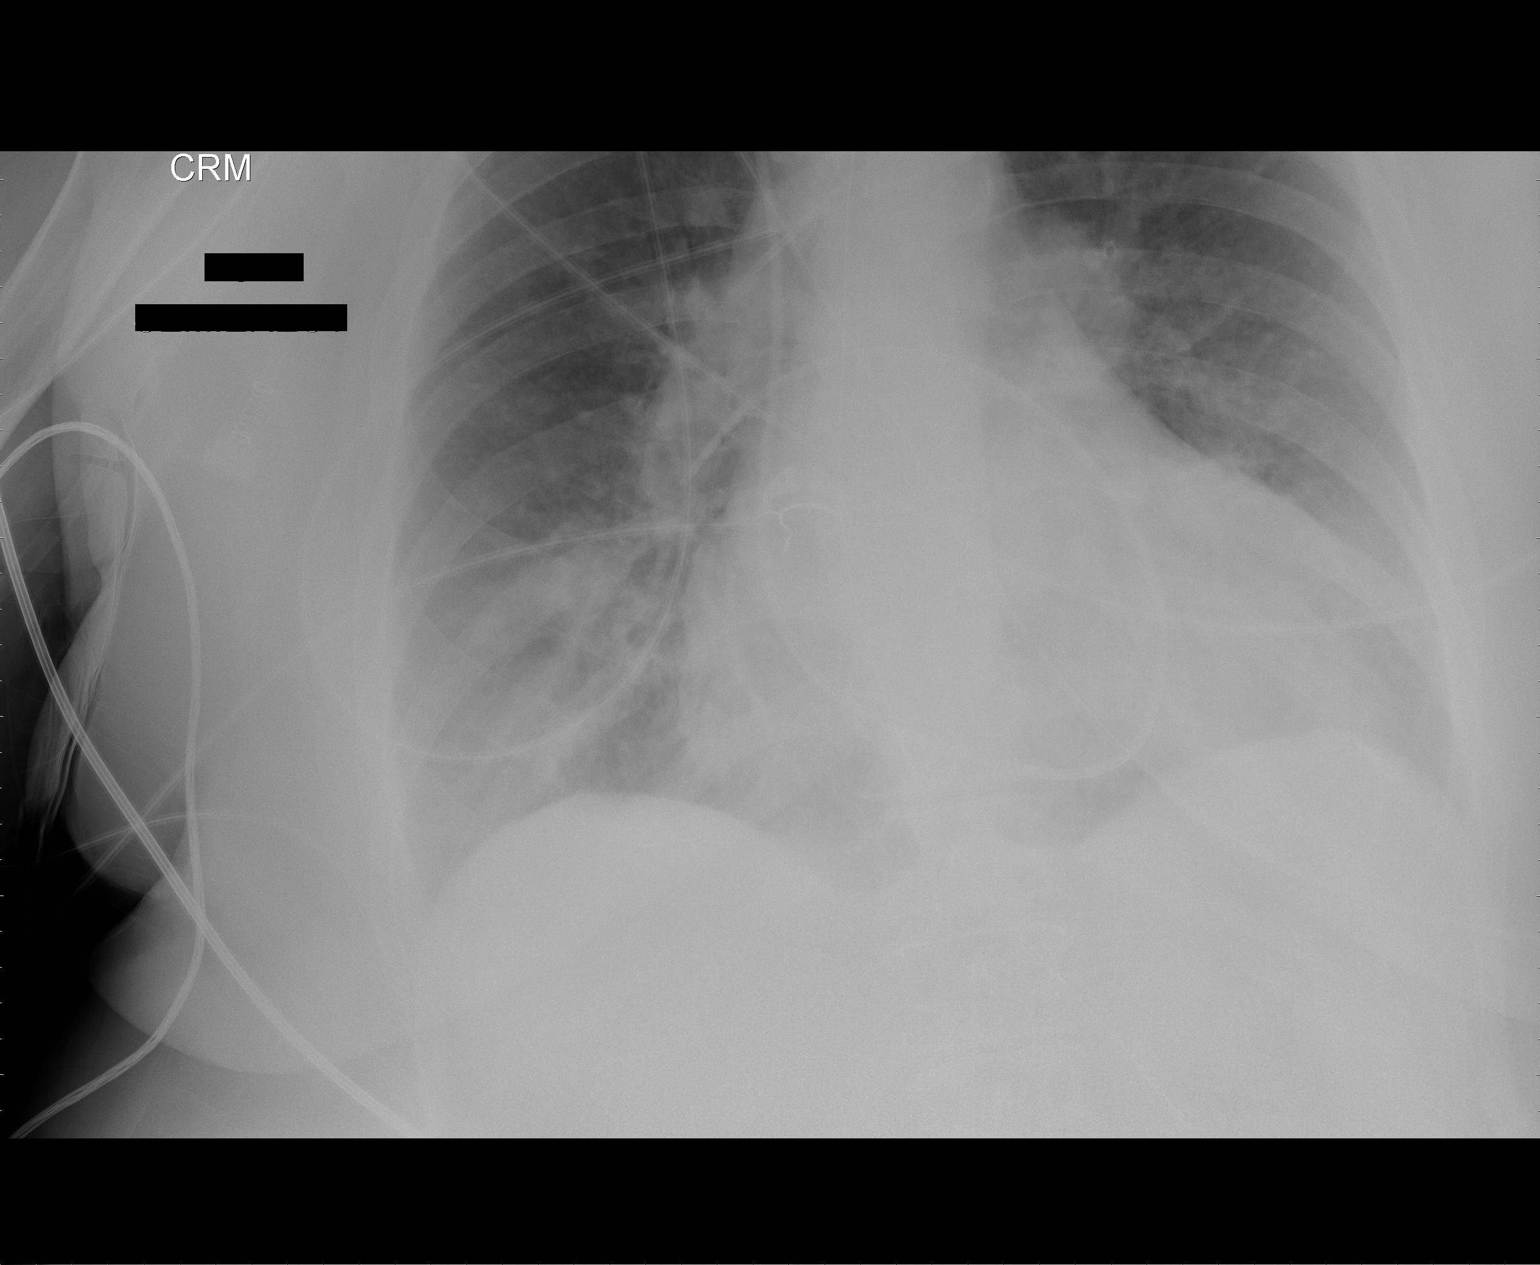

[2 of 2 positions shown; findings below may reference images not displayed]

FINDINGS: Grossly unchanged cardiac silhouette and mediastinal contours.
Stable position of support apparatus including right jugular
approach pulmonary arterial catheter overlying the right intralobar
pulmonary artery.  Improved aeration of the lungs.  Persistent
perihilar heterogeneous opacities.  No new focal airspace opacity.
No definite pleural effusion or pneumothorax.  Unchanged bones.
IMPRESSION: 1.  Stable positioning of support apparatus.  No pneumothorax.
2.  Improved pulmonary edema.
3.  Persistent perihilar opacities favored to represent
atelectasis.

## 2013-03-10 IMAGING — CR DG CHEST 1V PORT
1 series · 1 of 1 positions shown · non-contrast
Comparison: 11/30/2011; 11/29/2011; 11/28/2011

CLINICAL DATA: Pulmonary edema, post extubation

PORTABLE CHEST - 1 VIEW

[AP]
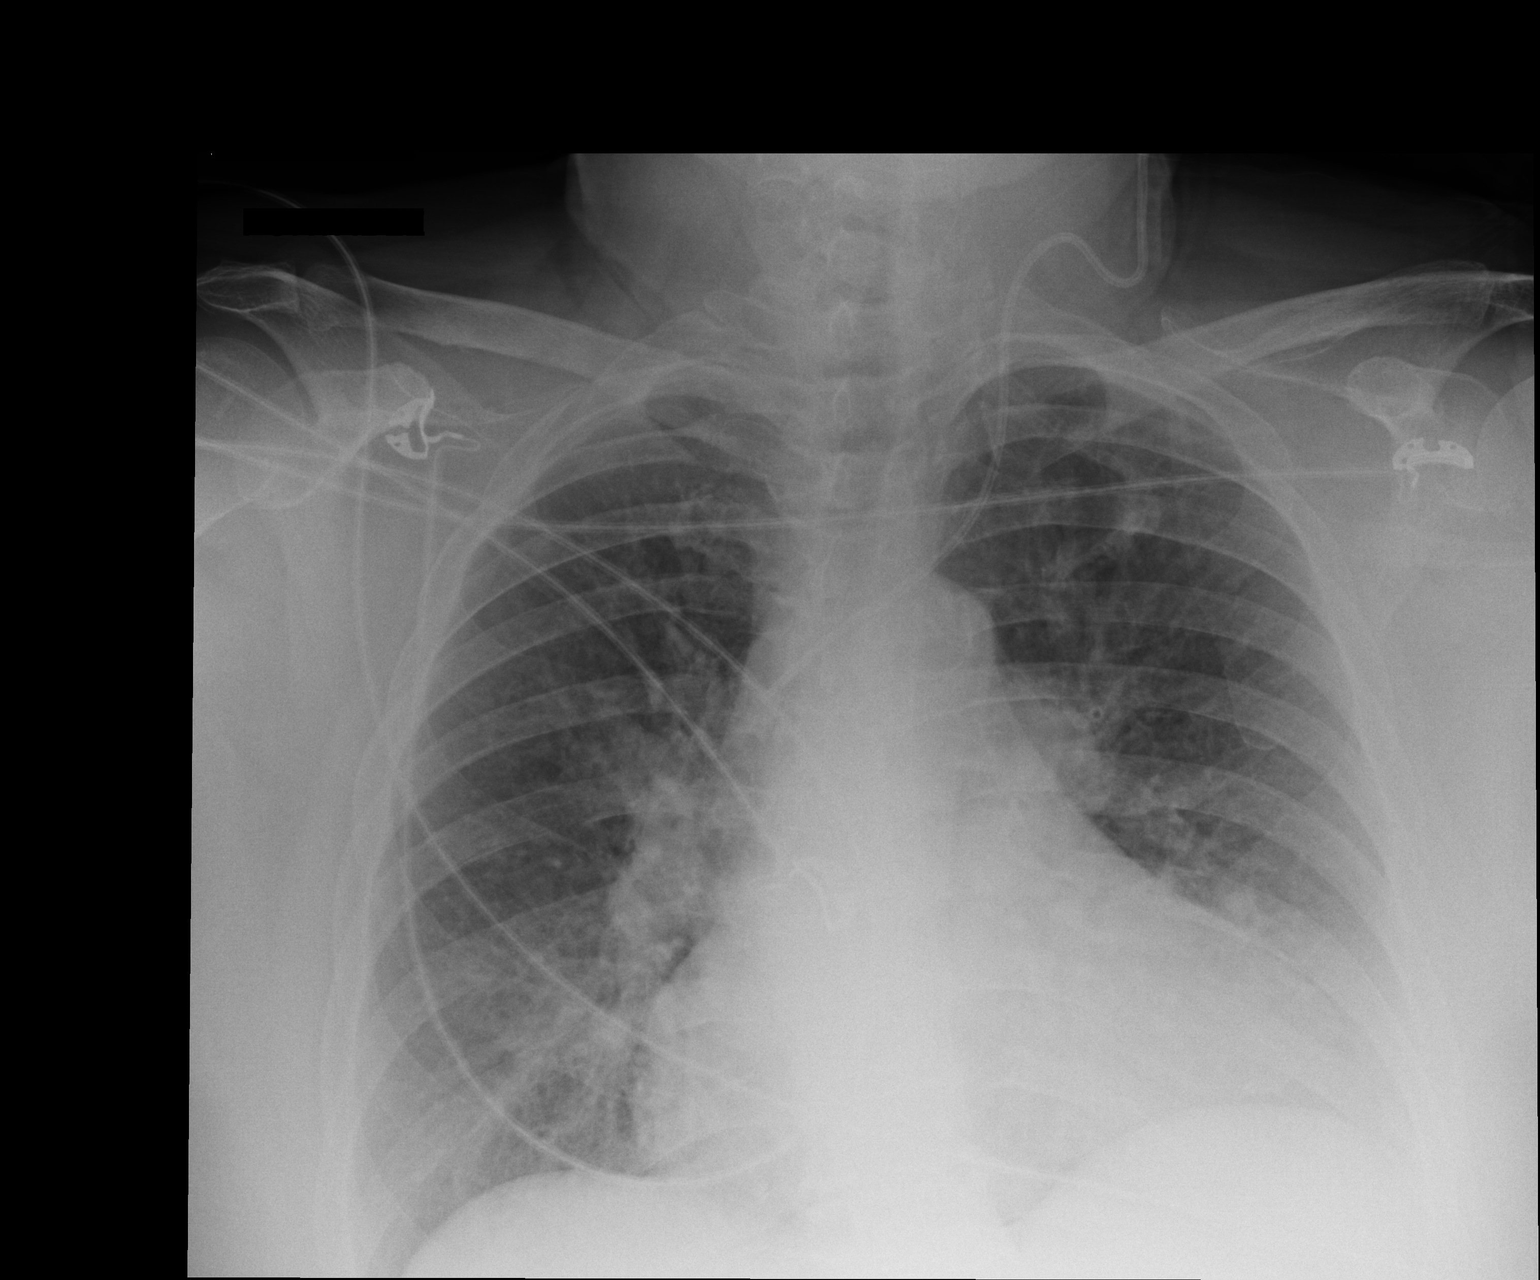

[1 of 1 positions shown; findings below may reference images not displayed]

FINDINGS: Grossly unchanged enlarged cardiac silhouette and
mediastinal contours with prominence of the central pulmonary
vasculature.  Interval extubation and removal of enteric tube.
Stable positioning of left jugular approach of venous catheter with
tip projected over the superior SVC.  The pulmonary vasculature
remains indistinct with cephalization of flow. Perihilar and
bibasilar opacities are grossly unchanged.  There is mild elevation
left hemidiaphragm.  No definite pleural effusion or pneumothorax.
Unchanged bones.
IMPRESSION: 1.  Interval extubation and removal of enteric tube.  No
pneumothorax.
2.  Persistently reduced lung volumes with findings suggestive of
pulmonary edema and perihilar/bibasilar atelectasis.
3.  Unchanged cardiomegaly and prominence of the central pulmonary
vasculature, nonspecific but may be seen in the setting of
pulmonary arterial hypertension.  Further evaluation with cardiac
echo may performed as clinically indicated.

## 2013-03-22 ENCOUNTER — Telehealth (HOSPITAL_COMMUNITY): Payer: Self-pay | Admitting: Cardiology

## 2013-03-22 ENCOUNTER — Encounter (HOSPITAL_COMMUNITY): Payer: Self-pay | Admitting: Cardiology

## 2013-03-22 NOTE — Telephone Encounter (Signed)
Attempting to schedule 3 month follow up with ECHO I have been unable to reach this patient by phone.  A letter is being sent to the last known home address.  

## 2013-12-20 ENCOUNTER — Encounter (HOSPITAL_COMMUNITY): Payer: Self-pay | Admitting: Internal Medicine

## 2015-02-05 ENCOUNTER — Encounter: Payer: Self-pay | Admitting: Internal Medicine

## 2015-02-05 ENCOUNTER — Ambulatory Visit (INDEPENDENT_AMBULATORY_CARE_PROVIDER_SITE_OTHER): Payer: Medicare Other | Admitting: Internal Medicine

## 2015-02-05 VITALS — BP 126/74 | HR 76 | Temp 99.0°F | Wt 254.4 lb

## 2015-02-05 DIAGNOSIS — R938 Abnormal findings on diagnostic imaging of other specified body structures: Secondary | ICD-10-CM | POA: Diagnosis not present

## 2015-02-05 DIAGNOSIS — J449 Chronic obstructive pulmonary disease, unspecified: Secondary | ICD-10-CM | POA: Diagnosis not present

## 2015-02-05 DIAGNOSIS — R9389 Abnormal findings on diagnostic imaging of other specified body structures: Secondary | ICD-10-CM

## 2015-02-05 NOTE — Patient Instructions (Addendum)
Try prilosec otc   Take 30-60 min before first meal of the day and Pepcid ac (famotidine) 20 mg one @  bedtime until cough is completely gone for at least a week without the need for cough suppression  Best cough medication in mucinex dm up 1200 mg every 12 hour as needs and use flutter valve as much as you can   GERD (REFLUX)  is an extremely common cause of respiratory symptoms just like yours , many times with no obvious heartburn at all.    It can be treated with medication, but also with lifestyle changes including elevation of the head of your bed (ideally with 6 inch  bed blocks),  Smoking cessation, avoidance of late meals, excessive alcohol, and avoid fatty foods, chocolate, peppermint, colas, red wine, and acidic juices such as orange juice.  NO MINT OR MENTHOL PRODUCTS SO NO COUGH DROPS  USE SUGARLESS CANDY INSTEAD (Jolley ranchers or Stover's or Life Savers) or even ice chips will also do - the key is to swallow to prevent all throat clearing. NO OIL BASED VITAMINS - use powdered substitutes   Please schedule a follow up office visit in 2 weeks, sooner if needed with previous cxr's and we'll obtain another one here to compare

## 2015-02-05 NOTE — Progress Notes (Signed)
Subjective:    Patient ID: [name], female    DOB: [birth year]    MRN: [id number]    Brief patient profile:  3 yowf with morbid obesity/ aodm? Quit smoking 01/2008 with known hx of COPD- O2 dependent     HPI 10/30/2010 f/u ov/Sylver Vantassell off cigarettes and off predisone on 02 4lpm 24 hours cc doe x 50 ft,  No cough, not using neb but has proair twice daily with doe x 25 ft. Only new c/o is increased leg swelling and has f/u with renal scheduled w/in the next week rec Work on inhaler technique   Double furosemide to where you take it 2 each am until swelling better, then one daily thereafter  Please schedule a follow up office visit in 4 weeks, sooner if needed pfts  07/06/2011 no show, never had pfts     08/28/2012 f/u ov/Acy Orsak 02 desp resp failure/ copd /obesity Chief Complaint  Patient presents with  . Follow-up    pt reports some diff breathing, chest congestion, green nasal drainage, some chills x 2 weeks    doe x across the room even on 02 so uses bsc rec Work on inhaler technique:   augmentin 875 twice daily x 10 days    10/11/2012 f/u ov/Philippa Vessey re: COPD GOLD III 02 dep Chief Complaint  Patient presents with  . COPD    Breathing is unchanged. Reports SOB, coughing, chest tightness and wheezing. PFT done today.  on symbicort and spiriva plus prn neb one per day, sob walking to bathroom even on 02 24/7, hfa still not adequate technique, mucus stays discolored until aroundlunch daily rec Plan A through C reviewed   11/22/2012 f/u ov/Marlia Schewe re: copd/ 02 dep  Chief Complaint  Patient presents with  . Follow-up    Breathing is unchanged since last OV.  Still having cough in a.m with green mucus  Not needing any rescue at all in any form 2lpm 24/7  Sputum only slt discolored am only and rest of day no color to it.  Has flutter, helps some  rec No change rx   02/05/2015  f/u ov/Rhyen Mazariego re: cough in pt with copd/ maint rx with symbicort 160 2bid  Chief Complaint  Patient  presents with  . Follow-up    refer for abnormal CXR.   more cough than usual since early Dec 2016    No obvious daytime variabilty or assoc  cp or chest tightness, subjective wheeze overt sinus or hb symptoms. No unusual exp hx or h/o childhood pna/ asthma or premature birth to her knowledge.   Sleeping ok on side and on 02 without nocturnal  or early am exacerbation  of respiratory  C/o's or HA/ drowsiness or need for noct saba. Also denies any obvious fluctuation of symptoms with weather or environmental changes or other aggravating or alleviating factors except as outlined above   Current Medications, Allergies, Past Medical History, Past Surgical History, Family History, and Social History were reviewed in [employer] record.  ROS  The following are not active complaints unless bolded sore throat, dysphagia, dental problems, itching, sneezing,  nasal congestion or excess/ purulent secretions, ear ache,   fever, chills, sweats, unintended wt loss, pleuritic or exertional cp, hemoptysis,  orthopnea pnd or leg swelling, presyncope, palpitations, heartburn, abdominal pain, anorexia, nausea, vomiting, diarrhea  or change in bowel or urinary habits, change in stools or urine, dysuria,hematuria,  rash, arthralgias, visual complaints, headache, numbness weakness or ataxia or problems with walking  or coordination,  change in mood/affect or memory.            Objective:   Physical Exam  02/05/2015        254  11/22/2012      233  10/11/2012        252  08/28/2012        251     05/24/12 253 lb (114.76 kg)  03/07/12 268 lb (121.564 kg)  02/11/12 274 lb 4 oz (124.399 kg)    GEN: A/Ox3, obese wf on 02 in wheelchair/ somewhat congested cough    HEENT:  [place]/AT,  EACs-clear, TMs-wnl, NOSE-clear, THROAT-clear, no lesions, no postnasal drip or exudate noted.   NECK:  Supple w/ fair ROM; no JVD; normal carotid impulses w/o bruits; no thyromegaly or nodules palpated; no  lymphadenopathy.  RESP   distant bs bilaterally with min bilat exp rhonchi bilaterally,no accessory muscle use,    CARD:  RRR, no m/r/g  , pulses intact, no cyanosis or clubbing, mild peripheral edema and stasis dermatitis  GI:   Soft & nt; nml bowel sounds; no organomegaly or masses detected.  Musco: Warm bil, no deformities or joint swelling noted.   Neuro: alert, no focal deficits noted.    Skin: Warm, no lesions or rashes    I personally reviewed images and agree with radiology impression as follows:  CXR:  10/11/12 Right middle lobe and lingular airspace disease may be chronic atelectasis or possibly recurrent pneumonia.            Assessment & Plan:

## 2015-02-09 NOTE — Assessment & Plan Note (Signed)
Rec f/u cxr for 06/03/12 pre op > recheck 10/11/2012 with RML/ lingular SSA   Just had cxr so did not repeat one but if present cxr shows more than atx changes in rml/lingula rec CT chest next step

## 2015-02-09 NOTE — Assessment & Plan Note (Signed)
Complicated by hbp /dm  Body mass index is 39.84    Lab Results  Component Value Date   TSH 0.236* 10/26/2011     Contributing to gerd tendency/ doe/reviewed the need and the process to achieve and maintain neg calorie balance > defer f/u primary care including intermittently monitoring thyroid status

## 2015-02-09 NOTE — Assessment & Plan Note (Addendum)
quit smoking 2010   - HFA 90% p extensive coaching 10/11/2012   - PFT's  10/11/2012  FEV1 1.03 (39%) ratio 56  And no better p B2,  DLCO 45 corrects to 69   Adequate control on present rx, reviewed > no change in rx needed  Though may need to consider off spiriva dpi if cough continues   Some of her coughing cough be gerd related > rec max rx for GERD with acid suppression/ diet   I had an extended discussion with the patient reviewing all relevant studies completed to date and  lasting 15 to 20 minutes of a 25 minute visit    Each maintenance medication was reviewed in detail including most importantly the difference between maintenance and prns and under what circumstances the prns are to be triggered using an action plan format that is not reflected in the computer generated alphabetically organized AVS.    Please see instructions for details which were reviewed in writing and the patient given a copy highlighting the part that I personally wrote and discussed at today's ov.

## 2015-02-24 ENCOUNTER — Ambulatory Visit: Payer: Medicare Other | Admitting: Internal Medicine

## 2015-03-25 ENCOUNTER — Ambulatory Visit: Payer: Medicare Other | Admitting: Internal Medicine

## 2015-04-02 ENCOUNTER — Other Ambulatory Visit: Payer: Self-pay | Admitting: Nephrology

## 2015-04-02 DIAGNOSIS — N184 Chronic kidney disease, stage 4 (severe): Secondary | ICD-10-CM

## 2015-08-04 ENCOUNTER — Encounter: Payer: Self-pay | Admitting: Internal Medicine

## 2015-08-04 ENCOUNTER — Ambulatory Visit (INDEPENDENT_AMBULATORY_CARE_PROVIDER_SITE_OTHER)
Admission: RE | Admit: 2015-08-04 | Discharge: 2015-08-04 | Disposition: A | Discharge status: Home / Self Care | Payer: Medicare Other | Source: Ambulatory Visit | Attending: Internal Medicine | Admitting: Internal Medicine

## 2015-08-04 ENCOUNTER — Ambulatory Visit (INDEPENDENT_AMBULATORY_CARE_PROVIDER_SITE_OTHER): Payer: Medicare Other | Admitting: Internal Medicine

## 2015-08-04 VITALS — BP 118/72 | HR 82 | Ht 66.5 in | Wt 271.0 lb

## 2015-08-04 DIAGNOSIS — J9611 Chronic respiratory failure with hypoxia: Secondary | ICD-10-CM

## 2015-08-04 DIAGNOSIS — I5023 Acute on chronic systolic (congestive) heart failure: Secondary | ICD-10-CM

## 2015-08-04 DIAGNOSIS — J449 Chronic obstructive pulmonary disease, unspecified: Secondary | ICD-10-CM | POA: Diagnosis not present

## 2015-08-04 NOTE — Patient Instructions (Addendum)
Prednisone 10 mg take  4 each am x 2 days,   2 each am x 2 days,  1 each am x 2 days and stop  Augmentin 875 mg take one pill twice daily  X 10 days - take at breakfast and supper with large glass of water.  It would help reduce the usual side effects (diarrhea and yeast infections) if you ate cultured yogurt at lunch.   For cough mucinex or mucinex dm up to 1200 mg every 12 hours as needed and use the flutter valve as much as you can  Please remember to go to the  x-ray department downstairs for your tests - we will call you with the results when they are available.  Return if not back to your usual self  Add: extra dose zaroxolyn 08/05/2015 rec

## 2015-08-04 NOTE — Progress Notes (Signed)
Subjective:    Patient ID: [name], female    DOB: [birth year]    MRN: [id number]    Brief patient profile:  41 yowf with morbid obesity/ aodm? Quit smoking 01/2008 with known hx of COPD- O2 dependent     HPI 10/30/2010 f/u ov/[name] off cigarettes and off predisone on 02 4lpm 24 hours cc doe x 50 ft,  No cough, not using neb but has proair twice daily with doe x 25 ft. Only new c/o is increased leg swelling and has f/u with renal scheduled w/in the next week rec Work on inhaler technique   Double furosemide to where you take it 2 each am until swelling better, then one daily thereafter  Please schedule a follow up office visit in 4 weeks, sooner if needed pfts  07/06/2011 no show    02/05/2015  f/u ov/[name] re: cough in pt with copd/ maint rx with symbicort 160 2bid  Chief Complaint  Patient presents with  . Follow-up    refer for abnormal CXR.   more cough than usual since early Dec 2016   rec Try prilosec otc   Take 30-60 min before first meal of the day and Pepcid ac (famotidine) 20 mg one @  bedtime until cough is completely gone for at least a week without the need for cough suppression Best cough medication in mucinex dm up 1200 mg every 12 hour as needs and use flutter valve as much as you can  GERD diet  Please schedule a follow up office visit in 2 weeks, sooner if needed with previous cxr's and we'll obtain another one here to compare> did not return   08/04/2015  Acute ov/[name] re:  Copd III/ AB with 02  2lpm / snf / symb 160 /spiriva dip  Chief Complaint  Patient presents with  . Acute Visit    Pt c/o increased SOB that she relates to humid weather. She also c/o prod cough with green sputum and runny nose.   Onset cough and sob abrupt 2 weeks prior to ov acutely and did not use flutter as rec nor using much albuterol but def more sob vs baseline.   No obvious daytime variabilty or assoc cp or chest tightness, subjective wheeze overt sinus or hb symptoms. No  unusual exp hx or h/o childhood pna/ asthma or premature birth to her knowledge.   Sleeping ok on side and on 02 without nocturnal  or early am exacerbation  of respiratory  C/o's or HA/ drowsiness or need for noct saba. Also denies any obvious fluctuation of symptoms with weather or environmental changes or other aggravating or alleviating factors except as outlined above   Current Medications, Allergies, Past Medical History, Past Surgical History, Family History, and Social History were reviewed in [employer] record.  ROS  The following are not active complaints unless bolded sore throat, dysphagia, dental problems, itching, sneezing,  nasal congestion or excess/ purulent secretions, ear ache,   fever, chills, sweats, unintended wt loss, pleuritic or exertional cp, hemoptysis,  orthopnea pnd or leg swelling, presyncope, palpitations, heartburn, abdominal pain, anorexia, nausea, vomiting, diarrhea  or change in bowel or urinary habits, change in stools or urine, dysuria,hematuria,  rash, arthralgias, visual complaints, headache, numbness weakness or ataxia or problems with walking or coordination,  change in mood/affect or memory.            Objective:   Physical Exam   08/04/2015        271  02/05/2015        254  11/22/2012      233  10/11/2012        252  08/28/2012        251     05/24/12 253 lb (114.76 kg)  03/07/12 268 lb (121.564 kg)  02/11/12 274 lb 4 oz (124.399 kg)     Vital signs reviewed:   Stas 93% on 2lpm NP in w/c   GEN: A/Ox3, obese wf on 02 in wheelchair/ somewhat congested cough     HEENT:  [place]/AT,  EACs-clear, TMs-wnl, NOSE-clear, THROAT-clear, no lesions, no postnasal drip or exudate noted.   NECK:  Supple w/ fair ROM; no JVD; normal carotid impulses w/o bruits; no thyromegaly or nodules palpated; no lymphadenopathy.    RESP   distant bs bilaterally with min bilat insp/exp rhonchi bilaterally, no accessory muscle use,    CARD:  RRR, no  m/r/g  , pulses intact, no cyanosis or clubbing, mild peripheral edema and stasis dermatitis  GI:   Soft & nt; nml bowel sounds; no organomegaly or masses detected.   Musco: Warm bil, no deformities or joint swelling noted.   Neuro: alert, no focal deficits noted.    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  My impression: no convincing chf but hard to exclude                 Assessment & Plan:

## 2015-08-05 ENCOUNTER — Telehealth: Payer: Self-pay | Admitting: Internal Medicine

## 2015-08-05 NOTE — Assessment & Plan Note (Signed)
-   Refused bibap @ [facility] 09/2010 [facility]  rx = 02 2 lpm 24/7 as of 08/04/2015 > Adequate control on present rx, reviewed > no change in rx needed

## 2015-08-05 NOTE — Assessment & Plan Note (Signed)
cxr looks slt wet but hard to be sure > rec one extra dose zaroxylin this week then resume previous diuretic rx/ f/u cards prn

## 2015-08-05 NOTE — Assessment & Plan Note (Signed)
quit smoking 2010   - HFA 90% p extensive coaching 10/11/2012   - PFT's  10/11/2012  FEV1 1.03 (39%) ratio 56  And no better p B2,  DLCO 45 corrects to 69   Mild flare ? Related to AB vs component of cardiac asthma (see chf a/p) so rx both   I had an extended discussion with the patient reviewing all relevant studies completed to date and  lasting 15 to 20 minutes of a 25 minute visit    Each maintenance medication was reviewed in detail including most importantly the difference between maintenance and prns and under what circumstances the prns are to be triggered using an action plan format that is not reflected in the computer generated alphabetically organized AVS.    Please see instructions for details which were reviewed in writing and the patient given a copy highlighting the part that I personally wrote and discussed at today's ov.

## 2015-08-05 NOTE — Progress Notes (Signed)
lmtcb

## 2015-08-05 NOTE — Telephone Encounter (Signed)
Notes Recorded by Nyoka Cowden, MD on 08/05/2015 at 8:19 AM EDT Call pt: Reviewed cxr and rec Take extra dose of zaroxolyn today only (already gets 2.5 so should get x 2) as cxr suggests lungs slt wet   lmtcb x1

## 2015-08-06 NOTE — Telephone Encounter (Signed)
lmtcb x 2 for the pt.

## 2015-08-07 ENCOUNTER — Telehealth: Payer: Self-pay | Admitting: Internal Medicine

## 2015-08-07 NOTE — Telephone Encounter (Signed)
lmtcb x3 for pt. 

## 2015-08-07 NOTE — Telephone Encounter (Signed)
LMCTB x 1 

## 2015-08-07 NOTE — Telephone Encounter (Signed)
Pt returning call.[name] ° °

## 2015-08-07 NOTE — Telephone Encounter (Signed)
Spoke with pt and advised of results and recommendations. She will take extra dose of zaroxolyn today. Nothing further needed.

## 2015-08-07 NOTE — Telephone Encounter (Signed)
Order was signed, faxed and placed in MW's scan folder

## 2015-08-07 NOTE — Telephone Encounter (Signed)
Order written on the fax cover sheet and given to leslie to have MW sign this and fax to number on sheet.  Will forward to [place] to follow up on. thanks

## 2015-10-06 DIAGNOSIS — I509 Heart failure, unspecified: Secondary | ICD-10-CM

## 2015-10-06 DIAGNOSIS — J9611 Chronic respiratory failure with hypoxia: Secondary | ICD-10-CM | POA: Diagnosis not present

## 2015-10-06 DIAGNOSIS — Z7901 Long term (current) use of anticoagulants: Secondary | ICD-10-CM

## 2015-10-06 DIAGNOSIS — K922 Gastrointestinal hemorrhage, unspecified: Secondary | ICD-10-CM

## 2015-10-06 DIAGNOSIS — D62 Acute posthemorrhagic anemia: Secondary | ICD-10-CM

## 2015-10-06 DIAGNOSIS — J449 Chronic obstructive pulmonary disease, unspecified: Secondary | ICD-10-CM

## 2015-10-06 DIAGNOSIS — I4891 Unspecified atrial fibrillation: Secondary | ICD-10-CM

## 2015-10-06 DIAGNOSIS — E1165 Type 2 diabetes mellitus with hyperglycemia: Secondary | ICD-10-CM

## 2015-10-07 DIAGNOSIS — J449 Chronic obstructive pulmonary disease, unspecified: Secondary | ICD-10-CM | POA: Diagnosis not present

## 2015-10-07 DIAGNOSIS — E1165 Type 2 diabetes mellitus with hyperglycemia: Secondary | ICD-10-CM | POA: Diagnosis not present

## 2015-10-07 DIAGNOSIS — J9611 Chronic respiratory failure with hypoxia: Secondary | ICD-10-CM | POA: Diagnosis not present

## 2015-10-08 DIAGNOSIS — J449 Chronic obstructive pulmonary disease, unspecified: Secondary | ICD-10-CM | POA: Diagnosis not present

## 2015-10-08 DIAGNOSIS — J9611 Chronic respiratory failure with hypoxia: Secondary | ICD-10-CM | POA: Diagnosis not present

## 2015-10-08 DIAGNOSIS — E1165 Type 2 diabetes mellitus with hyperglycemia: Secondary | ICD-10-CM | POA: Diagnosis not present

## 2015-10-09 DIAGNOSIS — J9611 Chronic respiratory failure with hypoxia: Secondary | ICD-10-CM | POA: Diagnosis not present

## 2015-10-09 DIAGNOSIS — E1165 Type 2 diabetes mellitus with hyperglycemia: Secondary | ICD-10-CM | POA: Diagnosis not present

## 2015-10-09 DIAGNOSIS — J449 Chronic obstructive pulmonary disease, unspecified: Secondary | ICD-10-CM | POA: Diagnosis not present

## 2015-10-10 DIAGNOSIS — E1165 Type 2 diabetes mellitus with hyperglycemia: Secondary | ICD-10-CM | POA: Diagnosis not present

## 2015-10-10 DIAGNOSIS — J449 Chronic obstructive pulmonary disease, unspecified: Secondary | ICD-10-CM

## 2015-10-10 DIAGNOSIS — K922 Gastrointestinal hemorrhage, unspecified: Secondary | ICD-10-CM | POA: Diagnosis not present

## 2015-10-10 DIAGNOSIS — I509 Heart failure, unspecified: Secondary | ICD-10-CM

## 2015-10-10 DIAGNOSIS — I4891 Unspecified atrial fibrillation: Secondary | ICD-10-CM

## 2016-03-29 DIAGNOSIS — J9602 Acute respiratory failure with hypercapnia: Secondary | ICD-10-CM

## 2016-03-29 DIAGNOSIS — E1165 Type 2 diabetes mellitus with hyperglycemia: Secondary | ICD-10-CM

## 2016-03-29 DIAGNOSIS — J441 Chronic obstructive pulmonary disease with (acute) exacerbation: Secondary | ICD-10-CM

## 2016-03-29 DIAGNOSIS — J189 Pneumonia, unspecified organism: Secondary | ICD-10-CM | POA: Diagnosis not present

## 2016-03-29 DIAGNOSIS — I4891 Unspecified atrial fibrillation: Secondary | ICD-10-CM | POA: Diagnosis not present

## 2016-03-30 DIAGNOSIS — J189 Pneumonia, unspecified organism: Secondary | ICD-10-CM | POA: Diagnosis not present

## 2016-03-30 DIAGNOSIS — J9602 Acute respiratory failure with hypercapnia: Secondary | ICD-10-CM | POA: Diagnosis not present

## 2016-03-30 DIAGNOSIS — J441 Chronic obstructive pulmonary disease with (acute) exacerbation: Secondary | ICD-10-CM | POA: Diagnosis not present

## 2016-03-31 ENCOUNTER — Ambulatory Visit: Payer: Medicare Other | Admitting: Internal Medicine

## 2016-03-31 DIAGNOSIS — J9602 Acute respiratory failure with hypercapnia: Secondary | ICD-10-CM | POA: Diagnosis not present

## 2016-03-31 DIAGNOSIS — J441 Chronic obstructive pulmonary disease with (acute) exacerbation: Secondary | ICD-10-CM | POA: Diagnosis not present

## 2016-03-31 DIAGNOSIS — I4891 Unspecified atrial fibrillation: Secondary | ICD-10-CM | POA: Diagnosis not present

## 2016-03-31 DIAGNOSIS — J189 Pneumonia, unspecified organism: Secondary | ICD-10-CM | POA: Diagnosis not present

## 2016-04-01 DIAGNOSIS — J189 Pneumonia, unspecified organism: Secondary | ICD-10-CM | POA: Diagnosis not present

## 2016-04-01 DIAGNOSIS — J441 Chronic obstructive pulmonary disease with (acute) exacerbation: Secondary | ICD-10-CM | POA: Diagnosis not present

## 2016-04-01 DIAGNOSIS — J9602 Acute respiratory failure with hypercapnia: Secondary | ICD-10-CM | POA: Diagnosis not present

## 2016-04-02 DIAGNOSIS — J9602 Acute respiratory failure with hypercapnia: Secondary | ICD-10-CM | POA: Diagnosis not present

## 2016-04-02 DIAGNOSIS — J189 Pneumonia, unspecified organism: Secondary | ICD-10-CM | POA: Diagnosis not present

## 2016-04-02 DIAGNOSIS — J441 Chronic obstructive pulmonary disease with (acute) exacerbation: Secondary | ICD-10-CM | POA: Diagnosis not present

## 2016-04-03 DIAGNOSIS — J9602 Acute respiratory failure with hypercapnia: Secondary | ICD-10-CM | POA: Diagnosis not present

## 2016-04-03 DIAGNOSIS — J441 Chronic obstructive pulmonary disease with (acute) exacerbation: Secondary | ICD-10-CM | POA: Diagnosis not present

## 2016-04-03 DIAGNOSIS — J189 Pneumonia, unspecified organism: Secondary | ICD-10-CM | POA: Diagnosis not present

## 2016-05-19 DIAGNOSIS — E1165 Type 2 diabetes mellitus with hyperglycemia: Secondary | ICD-10-CM | POA: Diagnosis not present

## 2016-05-19 DIAGNOSIS — I4891 Unspecified atrial fibrillation: Secondary | ICD-10-CM

## 2016-05-19 DIAGNOSIS — N179 Acute kidney failure, unspecified: Secondary | ICD-10-CM | POA: Diagnosis not present

## 2016-05-19 DIAGNOSIS — J189 Pneumonia, unspecified organism: Secondary | ICD-10-CM | POA: Diagnosis not present

## 2016-05-19 DIAGNOSIS — J441 Chronic obstructive pulmonary disease with (acute) exacerbation: Secondary | ICD-10-CM | POA: Diagnosis not present

## 2016-05-19 DIAGNOSIS — A419 Sepsis, unspecified organism: Secondary | ICD-10-CM

## 2016-05-19 DIAGNOSIS — N390 Urinary tract infection, site not specified: Secondary | ICD-10-CM

## 2016-05-19 DIAGNOSIS — J9611 Chronic respiratory failure with hypoxia: Secondary | ICD-10-CM | POA: Diagnosis not present

## 2016-05-19 DIAGNOSIS — J9602 Acute respiratory failure with hypercapnia: Secondary | ICD-10-CM | POA: Diagnosis not present

## 2016-05-20 DIAGNOSIS — A419 Sepsis, unspecified organism: Secondary | ICD-10-CM | POA: Diagnosis not present

## 2016-05-20 DIAGNOSIS — J189 Pneumonia, unspecified organism: Secondary | ICD-10-CM | POA: Diagnosis not present

## 2016-05-20 DIAGNOSIS — N390 Urinary tract infection, site not specified: Secondary | ICD-10-CM | POA: Diagnosis not present

## 2016-05-20 DIAGNOSIS — N179 Acute kidney failure, unspecified: Secondary | ICD-10-CM

## 2016-05-20 DIAGNOSIS — I4891 Unspecified atrial fibrillation: Secondary | ICD-10-CM | POA: Diagnosis not present

## 2016-05-21 DIAGNOSIS — J189 Pneumonia, unspecified organism: Secondary | ICD-10-CM | POA: Diagnosis not present

## 2016-05-21 DIAGNOSIS — I4891 Unspecified atrial fibrillation: Secondary | ICD-10-CM | POA: Diagnosis not present

## 2016-05-21 DIAGNOSIS — A419 Sepsis, unspecified organism: Secondary | ICD-10-CM | POA: Diagnosis not present

## 2016-05-21 DIAGNOSIS — N390 Urinary tract infection, site not specified: Secondary | ICD-10-CM | POA: Diagnosis not present

## 2016-05-22 DIAGNOSIS — I4891 Unspecified atrial fibrillation: Secondary | ICD-10-CM | POA: Diagnosis not present

## 2016-05-22 DIAGNOSIS — N390 Urinary tract infection, site not specified: Secondary | ICD-10-CM | POA: Diagnosis not present

## 2016-05-22 DIAGNOSIS — A419 Sepsis, unspecified organism: Secondary | ICD-10-CM | POA: Diagnosis not present

## 2016-05-22 DIAGNOSIS — J189 Pneumonia, unspecified organism: Secondary | ICD-10-CM | POA: Diagnosis not present

## 2016-05-23 DIAGNOSIS — J189 Pneumonia, unspecified organism: Secondary | ICD-10-CM | POA: Diagnosis not present

## 2016-05-23 DIAGNOSIS — A419 Sepsis, unspecified organism: Secondary | ICD-10-CM | POA: Diagnosis not present

## 2016-05-23 DIAGNOSIS — N390 Urinary tract infection, site not specified: Secondary | ICD-10-CM | POA: Diagnosis not present

## 2016-05-23 DIAGNOSIS — I4891 Unspecified atrial fibrillation: Secondary | ICD-10-CM | POA: Diagnosis not present

## 2016-05-24 DIAGNOSIS — J189 Pneumonia, unspecified organism: Secondary | ICD-10-CM | POA: Diagnosis not present

## 2016-05-24 DIAGNOSIS — N390 Urinary tract infection, site not specified: Secondary | ICD-10-CM | POA: Diagnosis not present

## 2016-05-24 DIAGNOSIS — A419 Sepsis, unspecified organism: Secondary | ICD-10-CM | POA: Diagnosis not present

## 2016-05-24 DIAGNOSIS — I4891 Unspecified atrial fibrillation: Secondary | ICD-10-CM | POA: Diagnosis not present

## 2016-05-25 DIAGNOSIS — N390 Urinary tract infection, site not specified: Secondary | ICD-10-CM | POA: Diagnosis not present

## 2016-05-25 DIAGNOSIS — I4891 Unspecified atrial fibrillation: Secondary | ICD-10-CM | POA: Diagnosis not present

## 2016-05-25 DIAGNOSIS — J189 Pneumonia, unspecified organism: Secondary | ICD-10-CM | POA: Diagnosis not present

## 2016-05-25 DIAGNOSIS — A419 Sepsis, unspecified organism: Secondary | ICD-10-CM | POA: Diagnosis not present

## 2016-05-26 DIAGNOSIS — J9602 Acute respiratory failure with hypercapnia: Secondary | ICD-10-CM | POA: Diagnosis not present

## 2016-05-26 DIAGNOSIS — N390 Urinary tract infection, site not specified: Secondary | ICD-10-CM | POA: Diagnosis not present

## 2016-05-26 DIAGNOSIS — N179 Acute kidney failure, unspecified: Secondary | ICD-10-CM | POA: Diagnosis not present

## 2016-05-26 DIAGNOSIS — I4891 Unspecified atrial fibrillation: Secondary | ICD-10-CM | POA: Diagnosis not present

## 2016-05-26 DIAGNOSIS — J9611 Chronic respiratory failure with hypoxia: Secondary | ICD-10-CM | POA: Diagnosis not present

## 2016-05-26 DIAGNOSIS — A419 Sepsis, unspecified organism: Secondary | ICD-10-CM | POA: Diagnosis not present

## 2016-05-26 DIAGNOSIS — E1165 Type 2 diabetes mellitus with hyperglycemia: Secondary | ICD-10-CM | POA: Diagnosis not present

## 2016-05-26 DIAGNOSIS — J189 Pneumonia, unspecified organism: Secondary | ICD-10-CM | POA: Diagnosis not present

## 2016-05-26 DIAGNOSIS — J441 Chronic obstructive pulmonary disease with (acute) exacerbation: Secondary | ICD-10-CM | POA: Diagnosis not present

## 2016-05-27 DIAGNOSIS — A419 Sepsis, unspecified organism: Secondary | ICD-10-CM | POA: Diagnosis not present

## 2016-05-27 DIAGNOSIS — N390 Urinary tract infection, site not specified: Secondary | ICD-10-CM | POA: Diagnosis not present

## 2016-05-27 DIAGNOSIS — I4891 Unspecified atrial fibrillation: Secondary | ICD-10-CM | POA: Diagnosis not present

## 2016-05-27 DIAGNOSIS — J189 Pneumonia, unspecified organism: Secondary | ICD-10-CM | POA: Diagnosis not present

## 2016-05-28 DIAGNOSIS — N390 Urinary tract infection, site not specified: Secondary | ICD-10-CM | POA: Diagnosis not present

## 2016-05-28 DIAGNOSIS — A419 Sepsis, unspecified organism: Secondary | ICD-10-CM | POA: Diagnosis not present

## 2016-05-28 DIAGNOSIS — I4891 Unspecified atrial fibrillation: Secondary | ICD-10-CM | POA: Diagnosis not present

## 2016-05-28 DIAGNOSIS — J189 Pneumonia, unspecified organism: Secondary | ICD-10-CM | POA: Diagnosis not present

## 2016-05-29 DIAGNOSIS — J189 Pneumonia, unspecified organism: Secondary | ICD-10-CM | POA: Diagnosis not present

## 2016-05-29 DIAGNOSIS — I4891 Unspecified atrial fibrillation: Secondary | ICD-10-CM | POA: Diagnosis not present

## 2016-05-29 DIAGNOSIS — A419 Sepsis, unspecified organism: Secondary | ICD-10-CM | POA: Diagnosis not present

## 2016-05-29 DIAGNOSIS — N390 Urinary tract infection, site not specified: Secondary | ICD-10-CM | POA: Diagnosis not present

## 2016-05-30 DIAGNOSIS — J189 Pneumonia, unspecified organism: Secondary | ICD-10-CM | POA: Diagnosis not present

## 2016-05-30 DIAGNOSIS — N390 Urinary tract infection, site not specified: Secondary | ICD-10-CM | POA: Diagnosis not present

## 2016-05-30 DIAGNOSIS — A419 Sepsis, unspecified organism: Secondary | ICD-10-CM | POA: Diagnosis not present

## 2016-05-30 DIAGNOSIS — I4891 Unspecified atrial fibrillation: Secondary | ICD-10-CM | POA: Diagnosis not present

## 2016-07-11 DEATH — deceased

## 2019-01-01 ENCOUNTER — Encounter: Payer: Self-pay | Admitting: Gastroenterology
